# Patient Record
Sex: Female | Born: 1955 | Race: White | Hispanic: No | Marital: Married | Smoking: Never smoker
Health system: Southern US, Community
[De-identification: ages and names within clinical notes are randomized; demographics above are authoritative.]

## PROBLEM LIST (undated history)

## (undated) DIAGNOSIS — J45909 Unspecified asthma, uncomplicated: Secondary | ICD-10-CM

## (undated) DIAGNOSIS — Z973 Presence of spectacles and contact lenses: Secondary | ICD-10-CM

## (undated) DIAGNOSIS — T7840XA Allergy, unspecified, initial encounter: Secondary | ICD-10-CM

## (undated) DIAGNOSIS — R209 Unspecified disturbances of skin sensation: Secondary | ICD-10-CM

## (undated) DIAGNOSIS — R011 Cardiac murmur, unspecified: Secondary | ICD-10-CM

## (undated) DIAGNOSIS — G4730 Sleep apnea, unspecified: Secondary | ICD-10-CM

## (undated) DIAGNOSIS — M79609 Pain in unspecified limb: Secondary | ICD-10-CM

## (undated) DIAGNOSIS — Z8489 Family history of other specified conditions: Secondary | ICD-10-CM

## (undated) DIAGNOSIS — R42 Dizziness and giddiness: Secondary | ICD-10-CM

## (undated) DIAGNOSIS — E7800 Pure hypercholesterolemia, unspecified: Secondary | ICD-10-CM

## (undated) DIAGNOSIS — I209 Angina pectoris, unspecified: Secondary | ICD-10-CM

## (undated) DIAGNOSIS — R809 Proteinuria, unspecified: Secondary | ICD-10-CM

## (undated) DIAGNOSIS — I10 Essential (primary) hypertension: Secondary | ICD-10-CM

## (undated) DIAGNOSIS — Q891 Congenital malformations of adrenal gland: Secondary | ICD-10-CM

## (undated) DIAGNOSIS — M1990 Unspecified osteoarthritis, unspecified site: Secondary | ICD-10-CM

## (undated) DIAGNOSIS — R21 Rash and other nonspecific skin eruption: Secondary | ICD-10-CM

## (undated) DIAGNOSIS — K589 Irritable bowel syndrome without diarrhea: Secondary | ICD-10-CM

## (undated) DIAGNOSIS — B019 Varicella without complication: Secondary | ICD-10-CM

## (undated) DIAGNOSIS — R946 Abnormal results of thyroid function studies: Secondary | ICD-10-CM

## (undated) DIAGNOSIS — I4891 Unspecified atrial fibrillation: Secondary | ICD-10-CM

## (undated) DIAGNOSIS — R5381 Other malaise: Secondary | ICD-10-CM

## (undated) DIAGNOSIS — R32 Unspecified urinary incontinence: Secondary | ICD-10-CM

## (undated) DIAGNOSIS — K219 Gastro-esophageal reflux disease without esophagitis: Secondary | ICD-10-CM

## (undated) HISTORY — PX: EYE SURGERY: SHX253

## (undated) HISTORY — DX: Unspecified asthma, uncomplicated: J45.909

## (undated) HISTORY — DX: Unspecified osteoarthritis, unspecified site: M19.90

## (undated) HISTORY — DX: Gastro-esophageal reflux disease without esophagitis: K21.9

## (undated) HISTORY — DX: Varicella without complication: B01.9

## (undated) HISTORY — DX: Sleep apnea, unspecified: G47.30

## (undated) HISTORY — DX: Irritable bowel syndrome, unspecified: K58.9

## (undated) HISTORY — DX: Allergy, unspecified, initial encounter: T78.40XA

## (undated) HISTORY — DX: Cardiac murmur, unspecified: R01.1

## (undated) HISTORY — DX: Unspecified urinary incontinence: R32

## (undated) HISTORY — PX: URETHRAL DILATION: SUR417

## (undated) HISTORY — PX: ESOPHAGOGASTRODUODENOSCOPY: SHX1529

## (undated) HISTORY — PX: COLONOSCOPY: SHX174

---
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
## 2004-06-23 ENCOUNTER — Ambulatory Visit: Payer: Self-pay | Admitting: Internal Medicine

## 2004-07-05 ENCOUNTER — Ambulatory Visit: Payer: Self-pay | Admitting: Internal Medicine

## 2004-09-14 ENCOUNTER — Ambulatory Visit: Payer: Self-pay

## 2005-01-03 ENCOUNTER — Ambulatory Visit: Payer: Self-pay | Admitting: Internal Medicine

## 2005-06-25 ENCOUNTER — Ambulatory Visit: Payer: Self-pay | Admitting: Internal Medicine

## 2005-10-11 ENCOUNTER — Ambulatory Visit: Payer: Self-pay | Admitting: Internal Medicine

## 2006-03-26 ENCOUNTER — Ambulatory Visit: Payer: Self-pay | Admitting: Internal Medicine

## 2006-07-04 ENCOUNTER — Ambulatory Visit: Payer: Self-pay | Admitting: Internal Medicine

## 2006-07-12 ENCOUNTER — Ambulatory Visit: Payer: Self-pay | Admitting: Internal Medicine

## 2006-10-22 ENCOUNTER — Emergency Department: Payer: Self-pay | Admitting: Unknown Physician Specialty

## 2007-09-23 ENCOUNTER — Ambulatory Visit: Payer: Self-pay | Admitting: Internal Medicine

## 2008-09-03 HISTORY — PX: ABDOMINAL HYSTERECTOMY: SHX81

## 2008-09-23 ENCOUNTER — Ambulatory Visit: Payer: Self-pay | Admitting: Internal Medicine

## 2008-09-30 ENCOUNTER — Ambulatory Visit: Payer: Self-pay | Admitting: Internal Medicine

## 2010-01-02 ENCOUNTER — Ambulatory Visit: Payer: Self-pay | Admitting: Internal Medicine

## 2010-02-20 ENCOUNTER — Ambulatory Visit: Payer: Self-pay | Admitting: Unknown Physician Specialty

## 2010-03-27 ENCOUNTER — Ambulatory Visit: Payer: Self-pay | Admitting: Internal Medicine

## 2011-01-18 ENCOUNTER — Ambulatory Visit: Payer: Self-pay | Admitting: Internal Medicine

## 2011-05-04 ENCOUNTER — Ambulatory Visit: Payer: Self-pay | Admitting: Internal Medicine

## 2011-05-05 LAB — HM PAP SMEAR

## 2011-09-04 LAB — HM COLONOSCOPY

## 2012-01-22 ENCOUNTER — Ambulatory Visit: Payer: Self-pay | Admitting: Internal Medicine

## 2012-03-17 ENCOUNTER — Ambulatory Visit: Payer: Self-pay | Admitting: Unknown Physician Specialty

## 2012-03-19 LAB — PATHOLOGY REPORT

## 2012-06-06 ENCOUNTER — Ambulatory Visit (INDEPENDENT_AMBULATORY_CARE_PROVIDER_SITE_OTHER): Payer: PRIVATE HEALTH INSURANCE | Admitting: Internal Medicine

## 2012-06-06 ENCOUNTER — Encounter: Payer: Self-pay | Admitting: Internal Medicine

## 2012-06-06 VITALS — BP 120/80 | HR 70 | Temp 98.6°F | Ht 63.75 in | Wt 229.8 lb

## 2012-06-06 DIAGNOSIS — Z23 Encounter for immunization: Secondary | ICD-10-CM

## 2012-06-06 DIAGNOSIS — E119 Type 2 diabetes mellitus without complications: Secondary | ICD-10-CM | POA: Insufficient documentation

## 2012-06-06 DIAGNOSIS — K219 Gastro-esophageal reflux disease without esophagitis: Secondary | ICD-10-CM | POA: Insufficient documentation

## 2012-06-06 DIAGNOSIS — M79606 Pain in leg, unspecified: Secondary | ICD-10-CM

## 2012-06-06 DIAGNOSIS — I10 Essential (primary) hypertension: Secondary | ICD-10-CM

## 2012-06-06 DIAGNOSIS — M79609 Pain in unspecified limb: Secondary | ICD-10-CM

## 2012-06-06 DIAGNOSIS — E7800 Pure hypercholesterolemia, unspecified: Secondary | ICD-10-CM | POA: Insufficient documentation

## 2012-06-06 DIAGNOSIS — R32 Unspecified urinary incontinence: Secondary | ICD-10-CM

## 2012-06-06 MED ORDER — HYDROCHLOROTHIAZIDE 25 MG PO TABS: 25.0000 mg | ORAL_TABLET | Freq: Every day | ORAL | Status: DC

## 2012-06-06 MED ORDER — OXYBUTYNIN CHLORIDE 5 MG PO TABS: 5.0000 mg | ORAL_TABLET | Freq: Two times a day (BID) | ORAL | Status: DC

## 2012-06-06 MED ORDER — LISINOPRIL 40 MG PO TABS: 40.0000 mg | ORAL_TABLET | Freq: Every day | ORAL | Status: DC

## 2012-06-06 NOTE — Assessment & Plan Note (Addendum)
States sugars have been averaging 150.  Discussed importance of following a diabetic diet and exercise.  She is walking more.  Will obtain records to review.  States had recent labs.  Record sugars bid.  Follow metabolic panel and a1c.  Keep up to date with eye checks.  Same meds.

## 2012-06-06 NOTE — Patient Instructions (Signed)
It was good to see you today.  Let me know if the leg pain does not resolve.  We will schedule your physical and your follow up labs.

## 2012-06-06 NOTE — Assessment & Plan Note (Signed)
Low cholesterol diet and exercise.  Check lipid profile with next labs.     

## 2012-06-06 NOTE — Progress Notes (Signed)
  Subjective:    Patient ID: Christine Robertson, female    DOB: 23-Aug-1956, 56 y.o.   MRN: [id number]  HPI 56 year old female with past history of diabetes, hypertension and GERD who comes in today for a scheduled follow up.  She states that she has been doing relatively well.  States sugars have been averaging 150.  Trying to watch what she eats.  Walking more.  Blood pressure (per her report) has been well controlled.  She fell two weeks ago.  Landed on her right knee.  Slipped in the shower.  Denies any head, back or hip injury.  Only hurt her knee.  Bruised initially.  Is resolving.  No knee pain now.  No increased swelling.  Minimal discomfort just below her knee.  No increased warmth.  Able to walk without any pain.    Past Medical History  Diagnosis Date  . Arthritis   . Chicken pox   . Diabetes mellitus   . GERD (gastroesophageal reflux disease)   . Allergy     hay fever  . Hypertension   . Urinary incontinence      Review of Systems Patient denies any headache, lightheadedness or dizziness.  No chest pain, tightness or palpatations. No increased shortness of breath, cough or congestion.  No acid reflux, dysphagia or odynophagia.  No nausea or vomiting.  No abdominal pain or cramping.  No bowel change, such as diarrhea, constipation, BRBPR or melana.  No urine change.        Objective:   Physical Exam Filed Vitals:   06/06/12 0951  BP: 120/80  Pulse: 70  Temp: 98.6 F (30 C)   56 year old female in no acute distress.   HEENT:  Nares - clear.  OP- without lesions or erythema.  NECK:  Supple, nontender.  No audible carotid bruit.   HEART:  Appears to be regular. LUNGS:  Without crackles or wheezing audible.  Respirations even and unlabored.  Good breath sounds bilaterally.   RADIAL PULSE:  Equal bilaterally.  ABDOMEN:  Soft, nontender.  No audible abdominal bruit.   EXTREMITIES:  No increased edema to be present.  Resolving hematoma - medial aspect of knee and upper calf.  No  pain to palpation over the knee.  No increased erythema or warmth. No pain with flexion or full extension - at the knee.   Minimal tenderness to palpation anterior tibial just below the knee.  Some stasis changes.                   Assessment & Plan:  1.  LEG PAIN.  S/P fall.  No knee pain on exam.  Minimal pain just below the knee.  No pain with ambulation.  Tylenol prn as directed.  Warm compresses.  Follow.  Call or return if symptoms do not resolve or any change.

## 2012-06-06 NOTE — Assessment & Plan Note (Signed)
Controlled on Omeprazole 

## 2012-06-06 NOTE — Assessment & Plan Note (Signed)
Symptoms controlled on Ditropan.  Continue.    

## 2012-06-06 NOTE — Assessment & Plan Note (Signed)
Controlled on current regimen.  Same meds.         

## 2012-09-09 ENCOUNTER — Other Ambulatory Visit: Payer: Self-pay | Admitting: Internal Medicine

## 2012-09-09 MED ORDER — SITAGLIPTIN PHOSPHATE 100 MG PO TABS: 100.0000 mg | ORAL_TABLET | Freq: Every day | ORAL | Status: DC

## 2012-09-09 NOTE — Telephone Encounter (Signed)
sitaGLIPtin (JANUVIA) 100 MG tablet  # 30

## 2012-09-09 NOTE — Telephone Encounter (Signed)
Rx sent to pharmacy electronically.   

## 2012-09-12 ENCOUNTER — Encounter: Payer: PRIVATE HEALTH INSURANCE | Admitting: Internal Medicine

## 2012-09-12 ENCOUNTER — Telehealth: Payer: Self-pay | Admitting: Internal Medicine

## 2012-09-12 NOTE — Telephone Encounter (Signed)
Already sent into pharmacy by [facility]

## 2012-09-12 NOTE — Telephone Encounter (Signed)
Pt is needing refill on Jenuvia 100 mg. She uses [facility] in [place]

## 2012-11-10 ENCOUNTER — Ambulatory Visit (INDEPENDENT_AMBULATORY_CARE_PROVIDER_SITE_OTHER): Payer: PRIVATE HEALTH INSURANCE | Admitting: Internal Medicine

## 2012-11-10 ENCOUNTER — Encounter: Payer: Self-pay | Admitting: Internal Medicine

## 2012-11-10 VITALS — BP 110/70 | HR 68 | Temp 97.3°F | Ht 63.75 in | Wt 287.5 lb

## 2012-11-10 DIAGNOSIS — I10 Essential (primary) hypertension: Secondary | ICD-10-CM

## 2012-11-10 DIAGNOSIS — K219 Gastro-esophageal reflux disease without esophagitis: Secondary | ICD-10-CM

## 2012-11-10 DIAGNOSIS — E7800 Pure hypercholesterolemia, unspecified: Secondary | ICD-10-CM

## 2012-11-10 DIAGNOSIS — E119 Type 2 diabetes mellitus without complications: Secondary | ICD-10-CM

## 2012-11-10 DIAGNOSIS — Z1239 Encounter for other screening for malignant neoplasm of breast: Secondary | ICD-10-CM

## 2012-11-10 DIAGNOSIS — R32 Unspecified urinary incontinence: Secondary | ICD-10-CM

## 2012-11-10 DIAGNOSIS — R5381 Other malaise: Secondary | ICD-10-CM

## 2012-11-10 MED ORDER — FLUTICASONE PROPIONATE HFA 110 MCG/ACT IN AERO: 1.0000 | INHALATION_SPRAY | Freq: Two times a day (BID) | RESPIRATORY_TRACT | Status: DC

## 2012-11-10 MED ORDER — PREDNISONE 10 MG PO TABS: ORAL_TABLET | ORAL | Status: DC

## 2012-11-10 MED ORDER — ALBUTEROL SULFATE HFA 108 (90 BASE) MCG/ACT IN AERS: 2.0000 | INHALATION_SPRAY | Freq: Four times a day (QID) | RESPIRATORY_TRACT | Status: DC | PRN

## 2012-11-10 MED ORDER — FLUTICASONE PROPIONATE 50 MCG/ACT NA SUSP: 2.0000 | Freq: Every day | NASAL | Status: DC

## 2012-11-10 MED ORDER — LEVOFLOXACIN 500 MG PO TABS: 500.0000 mg | ORAL_TABLET | Freq: Every day | ORAL | Status: DC

## 2012-11-10 NOTE — Assessment & Plan Note (Signed)
Sugars as outlined.  Discussed importance of following a diabetic diet and exercise.  She is walking more.  Record sugars bid.  Follow metabolic panel and a1c.  Keep up to date with eye checks.  Same meds.

## 2012-11-10 NOTE — Assessment & Plan Note (Signed)
Controlled on current regimen.  Same meds.  Check metabolic panel.

## 2012-11-10 NOTE — Assessment & Plan Note (Signed)
Symptoms controlled on Ditropan.  Continue.

## 2012-11-10 NOTE — Assessment & Plan Note (Signed)
Controlled on Omeprazole 

## 2012-11-10 NOTE — Progress Notes (Signed)
Subjective:    Patient ID: Jackqulyn Livings, female    DOB: May 21, 1956, 57 y.o.   MRN: [id number]  HPI 57 year old female with past history of diabetes, hypertension and GERD who comes in today to follow up on these issues as well as for a complete physical exam.  She states that she has been doing relatively well.  States sugars have been averaging 159-180 in the am and lower in the pm.  She does report that over the last two months she has had some increased congestion.  Ears full.  Increased cough - worse at night.  Colored mucus production - light green.  Increased drainage.  Some wheezing.  No chest congestion.  No acid reflux. No bowel change.    Past Medical History  Diagnosis Date  . Arthritis   . Chicken pox   . Diabetes mellitus   . GERD (gastroesophageal reflux disease)   . Allergy     hay fever  . Hypertension   . Urinary incontinence     Current Outpatient Prescriptions on File Prior to Visit  Medication Sig Dispense Refill  . aspirin 81 MG tablet Take 81 mg by mouth daily.      . cetirizine (ZYRTEC) 10 MG tablet Take 10 mg by mouth daily.      . hydrochlorothiazide (HYDRODIURIL) 25 MG tablet Take 1 tablet (25 mg total) by mouth daily.  90 tablet  3  . lisinopril (PRINIVIL,ZESTRIL) 40 MG tablet Take 1 tablet (40 mg total) by mouth daily.  90 tablet  3  . metFORMIN (GLUCOPHAGE) 1000 MG tablet Take 1,000 mg by mouth 2 (two) times daily with a meal.      . Multiple Vitamin (MULTIVITAMIN) tablet Take 1 tablet by mouth daily.      Marland Kitchen omeprazole (PRILOSEC) 20 MG capsule Take 20 mg by mouth 2 (two) times daily.      Marland Kitchen oxybutynin (DITROPAN) 5 MG tablet Take 1 tablet (5 mg total) by mouth 2 (two) times daily.  180 tablet  3  . sitaGLIPtin (JANUVIA) 100 MG tablet Take 1 tablet (100 mg total) by mouth daily.  30 tablet  6  . Calcium Carbonate-Vitamin D (CALCIUM 600+D) 600-400 MG-UNIT per tablet Take 1 tablet by mouth 2 (two) times daily.       No current facility-administered medications  on file prior to visit.    Review of Systems Patient denies any headache, lightheadedness or dizziness.  Increased nasal congestion, drainage and productive mucus (colored).  No chest pain or palpitations. Increased cough and congestion.  Some wheezing.   No acid reflux, dysphagia or odynophagia.  No nausea or vomiting.  No abdominal pain or cramping.  No bowel change, such as diarrhea, constipation, BRBPR or melana.  No urine change.  Sugars as outlined.  Blood pressure averaging 110-115/70-75.  Is walking some.  No following a strict diet.      Objective:   Physical Exam  Filed Vitals:   11/10/12 1503  BP: 110/70  Pulse: 68  Temp: 97.3 F (67.11 C)   57 year old female in no acute distress.   HEENT:  Nares- clear.  Oropharynx - without lesions. NECK:  Supple.  Nontender.  No audible bruit.  HEART:  Appears to be regular. LUNGS:  No crackles or wheezing audible.  Respirations even and unlabored.  RADIAL PULSE:  Equal bilaterally.    BREASTS:  No nipple discharge or nipple retraction present.  Could not appreciate any distinct nodules or axillary adenopathy.  ABDOMEN:  Soft, nontender.  Bowel sounds present and normal.  No audible abdominal bruit.  GU:  Normal external genitalia.  Vaginal vault without lesions.  S/p hysterectomy.   Could not appreciate any adnexal masses or tenderness.   RECTAL:  Heme negative.   EXTREMITIES:  No increased edema present.  DP pulses palpable and equal bilaterally.        Assessment & Plan:  SINUSITIS/URI.  Will treat with levaquin 500mg  q day.  Flonase nasal spray and saline nasal spray as directed.  Flovent - as directed.  Albuterol inhaler as directed.  Prednisone taper starting at 40mg  and decreasing by 5mg  each day until off.  Follow closely.  Notify me if symptoms worsen or do not resolve.    HEALTH MAINTENANCE.  Physical today.  Is s/p hysterectomy.  Schedule mammogram when due.  Obtain records to review - for colonoscopy.

## 2012-11-10 NOTE — Assessment & Plan Note (Signed)
Low cholesterol diet and exercise.  Check lipid profile with next labs.

## 2012-11-15 ENCOUNTER — Emergency Department (HOSPITAL_COMMUNITY)
Admission: EM | Admit: 2012-11-15 | Discharge: 2012-11-15 | Disposition: A | Discharge status: Home / Self Care | Payer: PRIVATE HEALTH INSURANCE | Attending: Emergency Medicine | Admitting: Emergency Medicine

## 2012-11-15 ENCOUNTER — Encounter (HOSPITAL_COMMUNITY): Payer: Self-pay | Admitting: Emergency Medicine

## 2012-11-15 DIAGNOSIS — K219 Gastro-esophageal reflux disease without esophagitis: Secondary | ICD-10-CM | POA: Insufficient documentation

## 2012-11-15 DIAGNOSIS — IMO0002 Reserved for concepts with insufficient information to code with codable children: Secondary | ICD-10-CM | POA: Insufficient documentation

## 2012-11-15 DIAGNOSIS — E119 Type 2 diabetes mellitus without complications: Secondary | ICD-10-CM | POA: Insufficient documentation

## 2012-11-15 DIAGNOSIS — Z8739 Personal history of other diseases of the musculoskeletal system and connective tissue: Secondary | ICD-10-CM | POA: Insufficient documentation

## 2012-11-15 DIAGNOSIS — Z79899 Other long term (current) drug therapy: Secondary | ICD-10-CM | POA: Insufficient documentation

## 2012-11-15 DIAGNOSIS — I10 Essential (primary) hypertension: Secondary | ICD-10-CM | POA: Insufficient documentation

## 2012-11-15 DIAGNOSIS — R238 Other skin changes: Secondary | ICD-10-CM

## 2012-11-15 DIAGNOSIS — R233 Spontaneous ecchymoses: Secondary | ICD-10-CM

## 2012-11-15 DIAGNOSIS — Z8619 Personal history of other infectious and parasitic diseases: Secondary | ICD-10-CM | POA: Insufficient documentation

## 2012-11-15 DIAGNOSIS — Z7982 Long term (current) use of aspirin: Secondary | ICD-10-CM | POA: Insufficient documentation

## 2012-11-15 DIAGNOSIS — R58 Hemorrhage, not elsewhere classified: Secondary | ICD-10-CM | POA: Insufficient documentation

## 2012-11-15 LAB — URINALYSIS, ROUTINE W REFLEX MICROSCOPIC
Leukocytes, UA: NEGATIVE
Nitrite: NEGATIVE
Specific Gravity, Urine: 1.017 (ref 1.005–1.030)
pH: 5 (ref 5.0–8.0)

## 2012-11-15 LAB — CBC WITH DIFFERENTIAL/PLATELET
Basophils Absolute: 0 10*3/uL (ref 0.0–0.1)
Basophils Relative: 0 % (ref 0–1)
Eosinophils Relative: 2 % (ref 0–5)
HCT: 34.3 % — ABNORMAL LOW (ref 36.0–46.0)
MCHC: 34.7 g/dL (ref 30.0–36.0)
MCV: 84.3 fL (ref 78.0–100.0)
Monocytes Absolute: 0.6 10*3/uL (ref 0.1–1.0)
Neutro Abs: 7.2 10*3/uL (ref 1.7–7.7)
Platelets: 262 10*3/uL (ref 150–400)
RDW: 13.7 % (ref 11.5–15.5)

## 2012-11-15 LAB — COMPREHENSIVE METABOLIC PANEL
AST: 33 U/L (ref 0–37)
Albumin: 3.6 g/dL (ref 3.5–5.2)
Calcium: 9.7 mg/dL (ref 8.4–10.5)
Creatinine, Ser: 0.74 mg/dL (ref 0.50–1.10)

## 2012-11-15 LAB — APTT: aPTT: 30 seconds (ref 24–37)

## 2012-11-15 LAB — PROTIME-INR: INR: 0.99 (ref 0.00–1.49)

## 2012-11-15 NOTE — ED Provider Notes (Signed)
History  This chart was scribed for non-physician practitioner working with Dierdre Forth, PA-C/ Vida Roller, MD, by Candelaria Stagers, ED Scribe. This patient was seen in room TR10C/TR10C and the patient's care was started at 6:14 PM   CSN: [id number]  Arrival date & time 11/15/12  1421   First MD Initiated Contact with Patient 11/15/12 1811      Chief Complaint  Patient presents with  . Bleeding/Bruising     The history is provided by the patient. No language interpreter was used.   Christine Robertson is a 57 y.o. female who presents to the Emergency Department complaining of bruising to her legs and back that started last night.  She has recently started taking 40 mg taper of prednisone on 3/11 for URI and after calling her pharmacist was advised to come to the ED.  She denies any recent falls or trauma.  Pt has h/o diabetes and HTN.  Pt denies experiencing these sx before.  She denies abdominal pain or new SOB. She has no Hx of heavy bleeding and denies bleeding gums while brushing teeth, nose bleeds, blood in urine or feces.    Past Medical History  Diagnosis Date  . Arthritis   . Chicken pox   . Diabetes mellitus   . GERD (gastroesophageal reflux disease)   . Allergy     hay fever  . Hypertension   . Urinary incontinence     Past Surgical History  Procedure Laterality Date  . Abdominal hysterectomy  2010  . Tonsillectomy and adenoidectomy  1959  . Bladder surgery  1967    stretching of bladder    Family History  Problem Relation Age of Onset  . Cancer Mother     parents  . Diabetes Mother   . Diabetes Father   . Cancer Maternal Grandmother     breast    History  Substance Use Topics  . Smoking status: Never Smoker   . Smokeless tobacco: Never Used  . Alcohol Use: No    OB History   Grav Para Term Preterm Abortions TAB SAB Ect Mult Living                  Review of Systems  Respiratory: Negative for shortness of breath.   Gastrointestinal: Negative  for abdominal pain.  Skin: Positive for color change (bruising).  All other systems reviewed and are negative.    Allergies  Augmentin; Demerol; and Erythromycin  Home Medications   Current Outpatient Rx  Name  Route  Sig  Dispense  Refill  . albuterol (PROVENTIL HFA;VENTOLIN HFA) 108 (90 BASE) MCG/ACT inhaler   Inhalation   Inhale 2 puffs into the lungs every 6 (six) hours as needed for wheezing.   1 Inhaler   0   . aspirin 81 MG tablet   Oral   Take 81 mg by mouth daily.         . cetirizine (ZYRTEC) 10 MG tablet   Oral   Take 10 mg by mouth daily.         . fluticasone (FLONASE) 50 MCG/ACT nasal spray   Nasal   Place 2 sprays into the nose daily.   16 g   2   . fluticasone (FLOVENT HFA) 110 MCG/ACT inhaler   Inhalation   Inhale 1 puff into the lungs 2 (two) times daily.   1 Inhaler   1   . hydrochlorothiazide (HYDRODIURIL) 25 MG tablet   Oral   Take 1 tablet (  25 mg total) by mouth daily.   90 tablet   3   . levofloxacin (LEVAQUIN) 500 MG tablet   Oral   Take 1 tablet (500 mg total) by mouth daily.   10 tablet   0   . lisinopril (PRINIVIL,ZESTRIL) 40 MG tablet   Oral   Take 1 tablet (40 mg total) by mouth daily.   90 tablet   3   . metFORMIN (GLUCOPHAGE) 1000 MG tablet   Oral   Take 1,000 mg by mouth 2 (two) times daily with a meal.         . Multiple Vitamin (MULTIVITAMIN) tablet   Oral   Take 1 tablet by mouth daily.         Marland Kitchen omeprazole (PRILOSEC) 20 MG capsule   Oral   Take 20 mg by mouth 2 (two) times daily.         Marland Kitchen oxybutynin (DITROPAN) 5 MG tablet   Oral   Take 1 tablet (5 mg total) by mouth 2 (two) times daily.   180 tablet   3   . predniSONE (DELTASONE) 10 MG tablet      Take 4 tablets x 1 day and then decrease by 1/2 tablet per day until down to zero mg.   18 tablet   0   . sitaGLIPtin (JANUVIA) 100 MG tablet   Oral   Take 1 tablet (100 mg total) by mouth daily.   30 tablet   6     BP 148/75  Pulse 84   Temp(Src) 97.8 F (36.6 C) (Oral)  Resp 18  SpO2 95%  Physical Exam  Nursing note and vitals reviewed. Constitutional: She is oriented to person, place, and time. She appears well-developed and well-nourished. No distress.  HENT:  Head: Normocephalic and atraumatic.  Mouth/Throat: Oropharynx is clear and moist. No oropharyngeal exudate.  No petechiae or purpura noted in the oropharynx.    Eyes: EOM are normal. Pupils are equal, round, and reactive to light. No scleral icterus.  Mild pallor of conjunctiva.    Neck: Normal range of motion. Neck supple.  Cardiovascular: Normal rate, regular rhythm, normal heart sounds and intact distal pulses.  Exam reveals no gallop and no friction rub.   No murmur heard. Pulmonary/Chest: Effort normal and breath sounds normal. No respiratory distress. She has no wheezes. She has no rales. She exhibits no tenderness.  Abdominal: Soft. Bowel sounds are normal. She exhibits no mass. There is no tenderness. There is no rebound and no guarding.  Musculoskeletal: Normal range of motion. She exhibits no edema and no tenderness.  Lymphadenopathy:    She has no cervical adenopathy.  Neurological: She is alert and oriented to person, place, and time. She exhibits normal muscle tone. Coordination normal.  Speech is clear and goal oriented Moves extremities without ataxia  Skin: Skin is warm and dry. She is not diaphoretic. No erythema.  Multiple bruises over legs and abdomen all about 3 cm in diameter.  6 cm in diameter above gluteal cleft.  7 cm in diameter dark bruise to left flank.  3 cm in diameter bruise to the right flank.    Psychiatric: She has a normal mood and affect.    ED Course  Procedures   DIAGNOSTIC STUDIES: Oxygen Saturation is 95% on room air, normal by my interpretation.    COORDINATION OF CARE:  6:17 PM Discussed course of care with pt which includes blood work.  Pt understands and agrees.    Labs  Reviewed  CBC WITH DIFFERENTIAL -  Abnormal; Notable for the following:    WBC 11.7 (*)    Hemoglobin 11.9 (*)    HCT 34.3 (*)    All other components within normal limits  COMPREHENSIVE METABOLIC PANEL - Abnormal; Notable for the following:    Glucose, Bld 129 (*)    BUN 24 (*)    ALT 54 (*)    Total Bilirubin 0.2 (*)    All other components within normal limits  URINALYSIS, ROUTINE W REFLEX MICROSCOPIC  PROTIME-INR  APTT   No results found.   1. Abnormal bruising   2. Bruising, spontaneous       MDM  Jackqulyn Livings presents with spontaneous brusing.  Concern for ITP, TTP or other clotting disorder.  Less likely anemic in nature. Pt without B symptoms, no lymphadenopathy or fevers; I do not suspect Lymphoma.  Pt labs largely unremarkable; specifically there are no noted disorders with red blood cells, or platelets.  She has a mild leukocytosis of 11.7 without left shift. Patient with mildly elevated ALT at 54 she has a history of fatty liver. Patient's coagulation function is within normal limits and there is no evidence of blood in her urine. Patient is nontoxic, nonseptic appearing. She is in no apparent distress. Plan is to discharge home and have her followup with hematology on Monday.  I have also discussed reasons to return immediately to the ER.  Patient expresses understanding and agrees with plan.  I personally performed the services described in this documentation, which was scribed in my presence. The recorded information has been reviewed and is accurate.    Dahlia Client Arland Usery, PA-C 11/15/12 2354

## 2012-11-15 NOTE — ED Notes (Signed)
Pt recently started on Prednisone, proventil, flovent and fluticasone and Levofloxacin on Tuesday began with having bruising on multiple parts of her body including left flank, B/L legs, and buttock area. Pt denies recent injuries.

## 2012-11-15 NOTE — ED Notes (Signed)
PT denies any active bleeding including rectal bleeding.

## 2012-11-15 NOTE — ED Provider Notes (Signed)
57 year old female with a history of easy bruising over the last couple of days. She presents with multiple bruises to her abdomen, flanks, lower extremities which are unexplained. The patient denies any history of easy bleeding, has had no bleeding of her gums, no GI bleeding, no hematemesis or hemoptysis. She denies taking any other blood thinners was recently started on prednisone.  As well as a couple of other medications for what was thought to be an upper respiratory infection.  On my exam the patient has multiple small areas of bruising over her proximal lower 70s, buttocks, gluteal cleft, left flank. She has minimal tenderness over these areas, and she is morbidly obese, she has no difficulty breathing, no lesions in her mouth, normal appearing conjunctiva and oral mucosa.  Labs pending to evaluate the source of her easy bruising, check platelets, coagulopathy, hemoglobin level.  Medical screening examination/treatment/procedure(s) were conducted as a shared visit with non-physician practitioner(s) and myself.  I personally evaluated the patient during the encounter    Vida Roller, MD 11/15/12 [phone]

## 2012-11-15 NOTE — ED Notes (Signed)
Pt c/o bruising to the left flank and various other areas, states she has recently started taking prednisone, abx, and nasal spray for resp infection.  States she also takes 81 mg ASA/day.  Called her MD and was told to come to the ER

## 2012-11-17 ENCOUNTER — Encounter: Payer: Self-pay | Admitting: Internal Medicine

## 2012-11-17 ENCOUNTER — Telehealth: Payer: Self-pay | Admitting: Emergency Medicine

## 2012-11-17 NOTE — ED Provider Notes (Signed)
Medical screening examination/treatment/procedure(s) were conducted as a shared visit with non-physician practitioner(s) and myself.  I personally evaluated the patient during the encounter  Please see my separate respective documentation pertaining to this patient encounter   Vida Roller, MD 11/17/12 1447

## 2012-11-18 ENCOUNTER — Encounter: Payer: Self-pay | Admitting: Internal Medicine

## 2012-11-18 ENCOUNTER — Telehealth: Payer: Self-pay | Admitting: Internal Medicine

## 2012-11-18 ENCOUNTER — Ambulatory Visit (INDEPENDENT_AMBULATORY_CARE_PROVIDER_SITE_OTHER): Payer: PRIVATE HEALTH INSURANCE | Admitting: Internal Medicine

## 2012-11-18 VITALS — BP 130/68 | HR 72 | Temp 97.8°F | Ht 63.75 in | Wt 288.0 lb

## 2012-11-18 DIAGNOSIS — I10 Essential (primary) hypertension: Secondary | ICD-10-CM

## 2012-11-18 DIAGNOSIS — E119 Type 2 diabetes mellitus without complications: Secondary | ICD-10-CM

## 2012-11-18 NOTE — Telephone Encounter (Signed)
Pt called to confirm appointment she will be here

## 2012-11-18 NOTE — Telephone Encounter (Signed)
Left message on voice mail asking pt if she could come in today @ 11:30 and i ask pt to call office to confirm

## 2012-11-18 NOTE — Telephone Encounter (Signed)
This patient sent me a my chart message about an ER visit.  See if she can come in today 11/18/12 at 11:30 for ER follow up.  Thanks.

## 2012-11-23 ENCOUNTER — Encounter: Payer: Self-pay | Admitting: Internal Medicine

## 2012-11-23 NOTE — Assessment & Plan Note (Signed)
Record sugars bid.  Follow metabolic panel and a1c.  Same meds.

## 2012-11-23 NOTE — Progress Notes (Signed)
Subjective:    Patient ID: Christine Robertson, female    DOB: Apr 07, 1956, 57 y.o.   MRN: [id number]  HPI 57 year old female with past history of diabetes, hypertension and GERD who comes in today for an ER follow up.  She was seen here on 11/10/12 and was treated for a respiratory infection.  Was placed on inhalers, abx and oral prednisone.  She noticed over this past weekend that she had increased bruising.  She called the nurses line and was instructed to go to the ER for evaluation.  Labs were unrevealing.  She stopped the prednisone because she thought the bruising might be related to this medication.  Was instructed by the ER physician to follow up with hematology.  She was asked to come here for a follow up first.  She is off the prednisone.  The breathing is better, but still with increased cough and congestion.  The bruising is starting to reabsorb.  No increased bruising.  No rectal bleeding, nose bleeds, etc.  Only using her inhaler q day.  Still on abx.    Past Medical History  Diagnosis Date  . Arthritis   . Chicken pox   . Diabetes mellitus   . GERD (gastroesophageal reflux disease)   . Allergy     hay fever  . Hypertension   . Urinary incontinence     Current Outpatient Prescriptions on File Prior to Visit  Medication Sig Dispense Refill  . albuterol (PROVENTIL HFA;VENTOLIN HFA) 108 (90 BASE) MCG/ACT inhaler Inhale 2 puffs into the lungs every 6 (six) hours as needed for wheezing.  1 Inhaler  0  . aspirin 81 MG tablet Take 81 mg by mouth daily.      . cetirizine (ZYRTEC) 10 MG tablet Take 10 mg by mouth daily.      . fluticasone (FLONASE) 50 MCG/ACT nasal spray Place 2 sprays into the nose daily.  16 g  2  . fluticasone (FLOVENT HFA) 110 MCG/ACT inhaler Inhale 1 puff into the lungs 2 (two) times daily.  1 Inhaler  1  . hydrochlorothiazide (HYDRODIURIL) 25 MG tablet Take 1 tablet (25 mg total) by mouth daily.  90 tablet  3  . levofloxacin (LEVAQUIN) 500 MG tablet Take 1 tablet (500 mg  total) by mouth daily.  10 tablet  0  . lisinopril (PRINIVIL,ZESTRIL) 40 MG tablet Take 1 tablet (40 mg total) by mouth daily.  90 tablet  3  . metFORMIN (GLUCOPHAGE) 1000 MG tablet Take 1,000 mg by mouth 2 (two) times daily with a meal.      . Multiple Vitamin (MULTIVITAMIN) tablet Take 1 tablet by mouth daily.      Marland Kitchen omeprazole (PRILOSEC) 20 MG capsule Take 20 mg by mouth 2 (two) times daily.      Marland Kitchen oxybutynin (DITROPAN) 5 MG tablet Take 1 tablet (5 mg total) by mouth 2 (two) times daily.  180 tablet  3  . sitaGLIPtin (JANUVIA) 100 MG tablet Take 1 tablet (100 mg total) by mouth daily.  30 tablet  6  . predniSONE (DELTASONE) 10 MG tablet Take 4 tablets x 1 day and then decrease by 1/2 tablet per day until down to zero mg.  18 tablet  0   No current facility-administered medications on file prior to visit.    Review of Systems Patient denies any headache, lightheadedness or dizziness.  No chest pain or palpitations. Still with some congestion and cough, but has improved.  Breathing is better.  No acid  reflux, dysphagia or odynophagia.  No nausea or vomiting.  No abdominal pain or cramping.  No bowel change, such as diarrhea, constipation, BRBPR or melana.  No urine change.   Bruising as outlined above.  Appears to be reabsorbing.  No other bleeding.      Objective:   Physical Exam  Filed Vitals:   11/18/12 1110  BP: 130/68  Pulse: 72  Temp: 97.8 F (39.95 C)   57 year old female in no acute distress.   HEENT:  Nares- clear.  Oropharynx - without lesions. NECK:  Supple.  Nontender.  No audible bruit.  HEART:  Appears to be regular. LUNGS:  No crackles or wheezing audible.  Respirations even and unlabored.  RADIAL PULSE:  Equal bilaterally.   ABDOMEN:  Soft, nontender.  Bowel sounds present and normal.  No audible abdominal bruit.    EXTREMITIES:  No increased edema present.  DP pulses palpable and equal bilaterally. SKIN:  Multiple hematomas.  Reabsorbing.  Located on her abdomen,  legs and side.      Assessment & Plan:  EASY BRUISING.  Labs in the ER unrevealing.  Was on a short course of prednisone when this occurred.  Unsure that this is the reason for the bruising.  After discussion with the patient, will hold on referral to hematology at this time.  Treat the infection.  See if bruising continues or reoccurs.  If a persistent problem, will refer.    SINUSITIS/URI.  Complete abx.  Continue inhalers.  Increase the steroid inhaler to bid.  Will remain off oral prednisone.  Follow.  Notify me if symptoms do not resolve.     HEALTH MAINTENANCE.  Physical last visit.  Is s/p hysterectomy.  Obtain records to review - for colonoscopy.

## 2012-11-23 NOTE — Assessment & Plan Note (Signed)
Controlled on current regimen.  Same meds.         

## 2012-12-04 ENCOUNTER — Other Ambulatory Visit (INDEPENDENT_AMBULATORY_CARE_PROVIDER_SITE_OTHER): Payer: PRIVATE HEALTH INSURANCE

## 2012-12-04 DIAGNOSIS — R5381 Other malaise: Secondary | ICD-10-CM

## 2012-12-04 DIAGNOSIS — E7800 Pure hypercholesterolemia, unspecified: Secondary | ICD-10-CM

## 2012-12-04 DIAGNOSIS — I10 Essential (primary) hypertension: Secondary | ICD-10-CM

## 2012-12-04 DIAGNOSIS — E119 Type 2 diabetes mellitus without complications: Secondary | ICD-10-CM

## 2012-12-04 LAB — CBC WITH DIFFERENTIAL/PLATELET
Eosinophils Relative: 2.3 % (ref 0.0–5.0)
HCT: 36.7 % (ref 36.0–46.0)
Lymphs Abs: 1.9 10*3/uL (ref 0.7–4.0)
MCV: 85.5 fl (ref 78.0–100.0)
Monocytes Absolute: 0.4 10*3/uL (ref 0.1–1.0)
Platelets: 222 10*3/uL (ref 150.0–400.0)
WBC: 7.2 10*3/uL (ref 4.5–10.5)

## 2012-12-04 LAB — BASIC METABOLIC PANEL WITH GFR
Chloride: 104 mEq/L (ref 96–112)
GFR, Est Non African American: >89 mL/min
Potassium: 4.4 mEq/L (ref 3.5–5.3)
Sodium: 141 mEq/L (ref 135–145)

## 2012-12-04 LAB — HEPATIC FUNCTION PANEL
ALT: 38 U/L — ABNORMAL HIGH (ref 0–35)
AST: 26 U/L (ref 0–37)
Alkaline Phosphatase: 61 U/L (ref 39–117)
Bilirubin, Direct: 0 mg/dL (ref 0.0–0.3)
Total Bilirubin: 0.2 mg/dL — ABNORMAL LOW (ref 0.3–1.2)

## 2012-12-04 LAB — TSH: TSH: 0.25 u[IU]/mL — ABNORMAL LOW (ref 0.35–5.50)

## 2012-12-04 LAB — LIPID PANEL
HDL: 46.7 mg/dL (ref >39.00)
Triglycerides: 177 mg/dL — ABNORMAL HIGH (ref 0.0–149.0)

## 2012-12-04 LAB — HEMOGLOBIN A1C: Hgb A1c MFr Bld: 7.6 % — ABNORMAL HIGH (ref 4.6–6.5)

## 2012-12-05 ENCOUNTER — Telehealth: Payer: Self-pay | Admitting: Internal Medicine

## 2012-12-05 ENCOUNTER — Ambulatory Visit: Payer: PRIVATE HEALTH INSURANCE

## 2012-12-05 DIAGNOSIS — E1059 Type 1 diabetes mellitus with other circulatory complications: Secondary | ICD-10-CM

## 2012-12-05 LAB — T4, FREE: Free T4: 1.05 ng/dL (ref 0.60–1.60)

## 2012-12-05 NOTE — Telephone Encounter (Signed)
Need to add free T3 and free T4 to blood drawn yesterday.  (dx 244.9).  Let me know if unable to add.

## 2012-12-08 ENCOUNTER — Telehealth: Payer: Self-pay | Admitting: Internal Medicine

## 2012-12-08 NOTE — Telephone Encounter (Signed)
Left message for pt to call office dr scott wanted to know if she could some in 12/15/12 @ 11

## 2012-12-08 NOTE — Telephone Encounter (Signed)
Pt aware of appointment 

## 2012-12-15 ENCOUNTER — Ambulatory Visit (INDEPENDENT_AMBULATORY_CARE_PROVIDER_SITE_OTHER): Payer: PRIVATE HEALTH INSURANCE | Admitting: Internal Medicine

## 2012-12-15 ENCOUNTER — Encounter: Payer: Self-pay | Admitting: Internal Medicine

## 2012-12-15 VITALS — BP 120/70 | HR 88 | Temp 98.3°F | Ht 63.75 in | Wt 283.8 lb

## 2012-12-15 DIAGNOSIS — R946 Abnormal results of thyroid function studies: Secondary | ICD-10-CM

## 2012-12-15 DIAGNOSIS — E119 Type 2 diabetes mellitus without complications: Secondary | ICD-10-CM

## 2012-12-15 DIAGNOSIS — R7989 Other specified abnormal findings of blood chemistry: Secondary | ICD-10-CM

## 2012-12-15 DIAGNOSIS — I10 Essential (primary) hypertension: Secondary | ICD-10-CM

## 2012-12-15 DIAGNOSIS — E7800 Pure hypercholesterolemia, unspecified: Secondary | ICD-10-CM

## 2012-12-15 MED ORDER — GLIMEPIRIDE 2 MG PO TABS: ORAL_TABLET | ORAL | Status: DC

## 2012-12-17 NOTE — Telephone Encounter (Signed)
Please close encounter

## 2012-12-22 ENCOUNTER — Encounter: Payer: Self-pay | Admitting: Internal Medicine

## 2012-12-22 DIAGNOSIS — R946 Abnormal results of thyroid function studies: Secondary | ICD-10-CM | POA: Insufficient documentation

## 2012-12-22 DIAGNOSIS — R7989 Other specified abnormal findings of blood chemistry: Secondary | ICD-10-CM | POA: Insufficient documentation

## 2012-12-22 NOTE — Assessment & Plan Note (Signed)
Controlled on current regimen.  Same meds.

## 2012-12-22 NOTE — Assessment & Plan Note (Signed)
Recent A1c 7.6.  Stressed to her the importance of checking and recording sugars. Also discussed the importance of diet, exercise and weight loss.  On Metformin and Januvia.  Will add amaryl 2mg  (1/2 tablet q day).  Follow sugars.  Get her back in soon to reassess.

## 2012-12-22 NOTE — Assessment & Plan Note (Signed)
TSH recently checked and was suppressed.  Free T3 and free T4 wnl.  Will recheck tsh, free T3 and free T4 at next appt (upcoming soon).

## 2012-12-22 NOTE — Assessment & Plan Note (Signed)
Low cholesterol diet and exercise.  Follow lipid profile.    

## 2012-12-22 NOTE — Progress Notes (Signed)
Subjective:    Patient ID: Christine Robertson, female    DOB: 17-Jun-1956, 57 y.o.   MRN: [id number]  HPI 57 year old female with past history of diabetes, hypertension and GERD who comes in today to discuss her sugars.  Recent a1c elevated - 7.6.  Since the phone call to inform her about her labs, she has been checking her sugars and recording.  Also, has been trying to do better - watching what she eats.  Prior to this, had not been checking her sugars and had not been exercising or watching what she eats.  Plans to get more serious about her diabetes.  States her sugars in the am are averaging 149-159 and pm sugars 159-169.  Has started walking some.  Breathing better.  No cough or congestion.  No nausea or vomiting.  On metformin and Januvia.  Taking regularly.     Past Medical History  Diagnosis Date  . Arthritis   . Chicken pox   . Diabetes mellitus   . GERD (gastroesophageal reflux disease)   . Allergy     hay fever  . Hypertension   . Urinary incontinence     Current Outpatient Prescriptions on File Prior to Visit  Medication Sig Dispense Refill  . albuterol (PROVENTIL HFA;VENTOLIN HFA) 108 (90 BASE) MCG/ACT inhaler Inhale 2 puffs into the lungs every 6 (six) hours as needed for wheezing.  1 Inhaler  0  . aspirin 81 MG tablet Take 81 mg by mouth daily.      . cetirizine (ZYRTEC) 10 MG tablet Take 10 mg by mouth daily.      . fluticasone (FLONASE) 50 MCG/ACT nasal spray Place 2 sprays into the nose daily.  16 g  2  . fluticasone (FLOVENT HFA) 110 MCG/ACT inhaler Inhale 1 puff into the lungs 2 (two) times daily.  1 Inhaler  1  . hydrochlorothiazide (HYDRODIURIL) 25 MG tablet Take 1 tablet (25 mg total) by mouth daily.  90 tablet  3  . lisinopril (PRINIVIL,ZESTRIL) 40 MG tablet Take 1 tablet (40 mg total) by mouth daily.  90 tablet  3  . metFORMIN (GLUCOPHAGE) 1000 MG tablet Take 1,000 mg by mouth 2 (two) times daily with a meal.      . Multiple Vitamin (MULTIVITAMIN) tablet Take 1 tablet  by mouth daily.      Marland Kitchen omeprazole (PRILOSEC) 20 MG capsule Take 20 mg by mouth 2 (two) times daily.      Marland Kitchen oxybutynin (DITROPAN) 5 MG tablet Take 1 tablet (5 mg total) by mouth 2 (two) times daily.  180 tablet  3  . sitaGLIPtin (JANUVIA) 100 MG tablet Take 1 tablet (100 mg total) by mouth daily.  30 tablet  6   No current facility-administered medications on file prior to visit.    Review of Systems Patient denies any headache, lightheadedness or dizziness.  No chest pain or palpitations.  Breathing is better.  Cough is better.  No acid reflux, dysphagia or odynophagia.  No nausea or vomiting.  No abdominal pain or cramping.  No bowel change, such as diarrhea, constipation, BRBPR or melana.  No urine change.   Sugars as outlined.  Is followed at [facility].  Blood pressures averaging 120-122/70s.      Objective:   Physical Exam  Filed Vitals:   12/15/12 1108  BP: 120/70  Pulse: 88  Temp: 98.3 F (61.86 C)   57 year old female in no acute distress.   HEENT:  Nares- clear.  Oropharynx - without lesions. NECK:  Supple.  Nontender.  No audible bruit.  HEART:  Appears to be regular. LUNGS:  No crackles or wheezing audible.  Respirations even and unlabored.  RADIAL PULSE:  Equal bilaterally.   ABDOMEN:  Soft, nontender.  Bowel sounds present and normal.  No audible abdominal bruit.    EXTREMITIES:  No increased edema present.       Assessment & Plan:  HEALTH MAINTENANCE.  Physical 11/10/12.  Is s/p hysterectomy.  Obtain records to review - for colonoscopy.

## 2013-01-01 ENCOUNTER — Other Ambulatory Visit: Payer: Self-pay | Admitting: *Deleted

## 2013-01-01 MED ORDER — OMEPRAZOLE 20 MG PO CPDR: 20.0000 mg | DELAYED_RELEASE_CAPSULE | Freq: Two times a day (BID) | ORAL | Status: DC

## 2013-01-12 ENCOUNTER — Encounter: Payer: Self-pay | Admitting: Internal Medicine

## 2013-01-12 ENCOUNTER — Ambulatory Visit (INDEPENDENT_AMBULATORY_CARE_PROVIDER_SITE_OTHER): Payer: PRIVATE HEALTH INSURANCE | Admitting: Internal Medicine

## 2013-01-12 VITALS — BP 100/60 | HR 79 | Temp 98.1°F | Ht 63.75 in | Wt 286.4 lb

## 2013-01-12 DIAGNOSIS — E7800 Pure hypercholesterolemia, unspecified: Secondary | ICD-10-CM

## 2013-01-12 DIAGNOSIS — E119 Type 2 diabetes mellitus without complications: Secondary | ICD-10-CM

## 2013-01-12 DIAGNOSIS — I10 Essential (primary) hypertension: Secondary | ICD-10-CM

## 2013-01-12 DIAGNOSIS — E039 Hypothyroidism, unspecified: Secondary | ICD-10-CM

## 2013-01-12 DIAGNOSIS — R946 Abnormal results of thyroid function studies: Secondary | ICD-10-CM

## 2013-01-12 DIAGNOSIS — K219 Gastro-esophageal reflux disease without esophagitis: Secondary | ICD-10-CM

## 2013-01-12 DIAGNOSIS — R7989 Other specified abnormal findings of blood chemistry: Secondary | ICD-10-CM

## 2013-01-12 NOTE — Assessment & Plan Note (Signed)
Controlled on current regimen.  Same meds.         

## 2013-01-12 NOTE — Assessment & Plan Note (Signed)
Low cholesterol diet and exercise.  Follow lipid profile.    

## 2013-01-12 NOTE — Assessment & Plan Note (Signed)
Sugars doing better.  Continue current med regimen.  Follow.  Continue diet and exercise.

## 2013-01-12 NOTE — Assessment & Plan Note (Signed)
Recheck today to confirm normal.  Previous free t4 and free t3 wnl.

## 2013-01-12 NOTE — Assessment & Plan Note (Signed)
Controlled on Omeprazole 

## 2013-01-12 NOTE — Progress Notes (Signed)
Subjective:    Patient ID: Christine Robertson, female    DOB: 1956/04/11, 57 y.o.   MRN: [id number]  HPI 57 year old female with past history of diabetes, hypertension and GERD who comes in today to discuss her sugars.  Recent a1c elevated - 7.6.  Has been trying to do better - watching what she eats.  Is walking some.  Sugars improving.   Was treated two weeks ago for sinus infection.  Took a zpak.  Is better.  Some congestion, but overall feels she is doing well.  Discussed using saline and Flonase.   Breathing better.  No significant cough or congestion.  No nausea or vomiting.  On metformin, amaryl and Januvia.  Taking regularly.     Past Medical History  Diagnosis Date  . Arthritis   . Chicken pox   . Diabetes mellitus   . GERD (gastroesophageal reflux disease)   . Allergy     hay fever  . Hypertension   . Urinary incontinence     Current Outpatient Prescriptions on File Prior to Visit  Medication Sig Dispense Refill  . albuterol (PROVENTIL HFA;VENTOLIN HFA) 108 (90 BASE) MCG/ACT inhaler Inhale 2 puffs into the lungs every 6 (six) hours as needed for wheezing.  1 Inhaler  0  . aspirin 81 MG tablet Take 81 mg by mouth daily.      . cetirizine (ZYRTEC) 10 MG tablet Take 10 mg by mouth daily.      . fluticasone (FLONASE) 50 MCG/ACT nasal spray Place 2 sprays into the nose daily.  16 g  2  . fluticasone (FLOVENT HFA) 110 MCG/ACT inhaler Inhale 1 puff into the lungs 2 (two) times daily.  1 Inhaler  1  . glimepiride (AMARYL) 2 MG tablet 1/2 tablet per day  30 tablet  1  . hydrochlorothiazide (HYDRODIURIL) 25 MG tablet Take 1 tablet (25 mg total) by mouth daily.  90 tablet  3  . lisinopril (PRINIVIL,ZESTRIL) 40 MG tablet Take 1 tablet (40 mg total) by mouth daily.  90 tablet  3  . metFORMIN (GLUCOPHAGE) 1000 MG tablet Take 1,000 mg by mouth 2 (two) times daily with a meal.      . Multiple Vitamin (MULTIVITAMIN) tablet Take 1 tablet by mouth daily.      Marland Kitchen omeprazole (PRILOSEC) 20 MG capsule Take  1 capsule (20 mg total) by mouth 2 (two) times daily.  30 capsule  5  . oxybutynin (DITROPAN) 5 MG tablet Take 1 tablet (5 mg total) by mouth 2 (two) times daily.  180 tablet  3  . sitaGLIPtin (JANUVIA) 100 MG tablet Take 1 tablet (100 mg total) by mouth daily.  30 tablet  6   No current facility-administered medications on file prior to visit.    Review of Systems Patient denies any headache, lightheadedness or dizziness.  No chest pain or palpitations.  Breathing is better.  Sinus symptoms better.  No acid reflux, dysphagia or odynophagia.  No nausea or vomiting.  No abdominal pain or cramping.  No bowel change, such as diarrhea, constipation, BRBPR or melana.  No urine change.   Sugars doing better.   Is followed at [facility].  Watching what she eats.      Objective:   Physical Exam  Filed Vitals:   01/12/13 1522  BP: 100/60  Pulse: 79  Temp: 98.1 F (36.7 C)   Blood pressure recheck:  122/78, pulse 79  57 year old female in no acute distress.  HEENT:  Nares- clear.  Oropharynx - without lesions. NECK:  Supple.  Nontender.  No audible bruit.  HEART:  Appears to be regular. LUNGS:  No crackles or wheezing audible.  Respirations even and unlabored.  RADIAL PULSE:  Equal bilaterally.   ABDOMEN:  Soft, nontender.  Bowel sounds present and normal.  No audible abdominal bruit.    EXTREMITIES:  No increased edema present.       Assessment & Plan:  HEALTH MAINTENANCE.  Physical 11/10/12.  Is s/p hysterectomy.  Need records to review - for colonoscopy.

## 2013-01-13 ENCOUNTER — Encounter: Payer: Self-pay | Admitting: Internal Medicine

## 2013-01-22 ENCOUNTER — Ambulatory Visit: Payer: Self-pay | Admitting: Internal Medicine

## 2013-01-22 LAB — HM MAMMOGRAPHY

## 2013-01-24 ENCOUNTER — Encounter: Payer: Self-pay | Admitting: Internal Medicine

## 2013-02-11 ENCOUNTER — Other Ambulatory Visit: Payer: Self-pay | Admitting: *Deleted

## 2013-02-11 MED ORDER — METFORMIN HCL 1000 MG PO TABS: 1000.0000 mg | ORAL_TABLET | Freq: Two times a day (BID) | ORAL | Status: DC

## 2013-02-13 ENCOUNTER — Telehealth: Payer: Self-pay | Admitting: Internal Medicine

## 2013-02-13 NOTE — Telephone Encounter (Signed)
metFORMIN (GLUCOPHAGE) 1000 MG tablet  Please send to [facility] # [phone]

## 2013-02-13 NOTE — Telephone Encounter (Signed)
Pt informed to have pharmacy transfer medication-Rx was sent to [facility] on 02/11/13

## 2013-02-16 ENCOUNTER — Other Ambulatory Visit: Payer: Self-pay | Admitting: *Deleted

## 2013-02-16 MED ORDER — METFORMIN HCL 1000 MG PO TABS: 1000.0000 mg | ORAL_TABLET | Freq: Two times a day (BID) | ORAL | Status: DC

## 2013-02-18 ENCOUNTER — Other Ambulatory Visit: Payer: Self-pay | Admitting: *Deleted

## 2013-02-18 MED ORDER — GLIMEPIRIDE 2 MG PO TABS: ORAL_TABLET | ORAL | Status: DC

## 2013-03-07 ENCOUNTER — Other Ambulatory Visit: Payer: Self-pay | Admitting: Internal Medicine

## 2013-03-07 MED ORDER — METFORMIN HCL 1000 MG PO TABS: 1000.0000 mg | ORAL_TABLET | Freq: Two times a day (BID) | ORAL | Status: DC

## 2013-03-07 NOTE — Progress Notes (Signed)
Previous metformin rx sent to [facility].  Refill metformin #60 with 5 refills to [facility] - per request.

## 2013-03-16 ENCOUNTER — Ambulatory Visit: Payer: PRIVATE HEALTH INSURANCE | Admitting: Internal Medicine

## 2013-03-20 ENCOUNTER — Encounter: Payer: Self-pay | Admitting: Internal Medicine

## 2013-04-08 ENCOUNTER — Encounter: Payer: Self-pay | Admitting: *Deleted

## 2013-04-20 NOTE — Telephone Encounter (Signed)
Mailed unread message to pt  

## 2013-04-25 ENCOUNTER — Other Ambulatory Visit: Payer: Self-pay | Admitting: Internal Medicine

## 2013-04-27 ENCOUNTER — Other Ambulatory Visit: Payer: Self-pay | Admitting: Internal Medicine

## 2013-05-14 ENCOUNTER — Ambulatory Visit: Payer: PRIVATE HEALTH INSURANCE | Admitting: Internal Medicine

## 2013-06-07 ENCOUNTER — Other Ambulatory Visit: Payer: Self-pay | Admitting: Internal Medicine

## 2013-06-15 ENCOUNTER — Other Ambulatory Visit: Payer: Self-pay | Admitting: Internal Medicine

## 2013-06-16 NOTE — Telephone Encounter (Signed)
Eprescribed.

## 2013-06-23 ENCOUNTER — Encounter: Payer: Self-pay | Admitting: Internal Medicine

## 2013-06-23 ENCOUNTER — Ambulatory Visit (INDEPENDENT_AMBULATORY_CARE_PROVIDER_SITE_OTHER): Payer: PRIVATE HEALTH INSURANCE | Admitting: Internal Medicine

## 2013-06-23 VITALS — BP 110/70 | HR 84 | Temp 98.1°F | Ht 63.75 in | Wt 293.2 lb

## 2013-06-23 DIAGNOSIS — K219 Gastro-esophageal reflux disease without esophagitis: Secondary | ICD-10-CM

## 2013-06-23 DIAGNOSIS — R7989 Other specified abnormal findings of blood chemistry: Secondary | ICD-10-CM

## 2013-06-23 DIAGNOSIS — E119 Type 2 diabetes mellitus without complications: Secondary | ICD-10-CM

## 2013-06-23 DIAGNOSIS — I10 Essential (primary) hypertension: Secondary | ICD-10-CM

## 2013-06-23 DIAGNOSIS — R809 Proteinuria, unspecified: Secondary | ICD-10-CM

## 2013-06-23 DIAGNOSIS — I4891 Unspecified atrial fibrillation: Secondary | ICD-10-CM

## 2013-06-23 DIAGNOSIS — E7800 Pure hypercholesterolemia, unspecified: Secondary | ICD-10-CM

## 2013-06-23 DIAGNOSIS — R946 Abnormal results of thyroid function studies: Secondary | ICD-10-CM

## 2013-06-23 DIAGNOSIS — R32 Unspecified urinary incontinence: Secondary | ICD-10-CM

## 2013-06-23 DIAGNOSIS — I499 Cardiac arrhythmia, unspecified: Secondary | ICD-10-CM

## 2013-06-23 DIAGNOSIS — Z23 Encounter for immunization: Secondary | ICD-10-CM

## 2013-06-23 DIAGNOSIS — Q891 Congenital malformations of adrenal gland: Secondary | ICD-10-CM

## 2013-06-23 LAB — HM DIABETES FOOT EXAM

## 2013-06-23 NOTE — Progress Notes (Signed)
Subjective:    Patient ID: Christine Robertson, female    DOB: 01-10-1956, 57 y.o.   MRN: [id number]  HPI 57 year old female with past history of diabetes, hypertension and GERD who comes in today for a scheduled follow up.  Has been trying to do better - watching what she eats.  Is walking some.  States sugars improving.    Am sugars averaging 130s and sugars after lunch - 108-110.  No cough or congestion.  No nausea or vomiting.  On metformin, amaryl and Januvia.  Taking regularly.  Denies any chest pain or tightness. No increased heart rate or palpitations.  Overall feels she is doing relatively well.    Past Medical History  Diagnosis Date  . Arthritis   . Chicken pox   . Diabetes mellitus   . GERD (gastroesophageal reflux disease)   . Allergy     hay fever  . Hypertension   . Urinary incontinence     Current Outpatient Prescriptions on File Prior to Visit  Medication Sig Dispense Refill  . albuterol (PROVENTIL HFA;VENTOLIN HFA) 108 (90 BASE) MCG/ACT inhaler Inhale 2 puffs into the lungs every 6 (six) hours as needed for wheezing.  1 Inhaler  0  . aspirin 81 MG tablet Take 81 mg by mouth daily.      . cetirizine (ZYRTEC) 10 MG tablet Take 10 mg by mouth daily.      Marland Kitchen glimepiride (AMARYL) 2 MG tablet 1/2 tablet per day  30 tablet  2  . hydrochlorothiazide (HYDRODIURIL) 25 MG tablet TAKE 1 TABLET BY MOUTH EVERY DAY  90 tablet  0  . JANUVIA 100 MG tablet TAKE 1 TABLET BY MOUTH DAILY.  30 tablet  5  . lisinopril (PRINIVIL,ZESTRIL) 40 MG tablet TAKE ONE TABLET BY MOUTH DAILY  90 tablet  1  . metFORMIN (GLUCOPHAGE) 1000 MG tablet Take 1 tablet (1,000 mg total) by mouth 2 (two) times daily with a meal.  60 tablet  5  . Multiple Vitamin (MULTIVITAMIN) tablet Take 1 tablet by mouth daily.      Marland Kitchen omeprazole (PRILOSEC) 20 MG capsule Take 1 capsule (20 mg total) by mouth 2 (two) times daily.  30 capsule  5  . oxybutynin (DITROPAN) 5 MG tablet TAKE 1 TABLET BY MOUTH TWICE DAILY  180 tablet  0  .  [DISCONTINUED] oxybutynin (DITROPAN) 5 MG tablet Take 1 tablet (5 mg total) by mouth 2 (two) times daily.  180 tablet  3   No current facility-administered medications on file prior to visit.    Review of Systems Patient denies any headache, lightheadedness or dizziness.  No chest pain or palpitations.  No sinus or allergy symptoms.   No acid reflux, dysphagia or odynophagia.  No nausea or vomiting.  No abdominal pain or cramping.  No bowel change, such as diarrhea, constipation, BRBPR or melana.  No urine change.   Sugars doing better.   Is followed at [facility].  Watching what she eats.  Walking.  Sugars as outlined.     Objective:   Physical Exam  Filed Vitals:   06/23/13 1630  BP: 110/70  Pulse: 84  Temp: 98.1 F (36.7 C)   Blood pressure recheck:  122/78, pulse 69  57 year old female in no acute distress.   HEENT:  Nares- clear.  Oropharynx - without lesions. NECK:  Supple.  Nontender.  No audible bruit.  HEART:  Appears to be irregular.  Rate - 92 LUNGS:  No crackles  or wheezing audible.  Respirations even and unlabored.  RADIAL PULSE:  Equal bilaterally.   ABDOMEN:  Soft, nontender.  Bowel sounds present and normal.  No audible abdominal bruit.    EXTREMITIES:  No increased edema present.  DP pulses palpable and equal bilaterally.  FEET:  No lesions.       Assessment & Plan:  HEALTH MAINTENANCE.  Physical 11/10/12.  Is s/p hysterectomy.  Colonoscopy 02/20/10 - internal hemorrhoids otherwise normal.  Recommended follow up colonoscopy in five years.     I spent 40 minutes with the patient and more than 50% of the time was spent in consultation regarding the above.

## 2013-06-23 NOTE — Assessment & Plan Note (Addendum)
Previously suppressed tsh, but most recent check revealed tsh, free t3 and free t4 - wnl.  Recheck.

## 2013-06-23 NOTE — Assessment & Plan Note (Addendum)
Sugars doing better.  Continue current med regimen.  Follow.  Continue diet and exercise.  Check metabolic panel and a1c.

## 2013-06-24 ENCOUNTER — Encounter: Payer: Self-pay | Admitting: Internal Medicine

## 2013-06-24 ENCOUNTER — Other Ambulatory Visit (INDEPENDENT_AMBULATORY_CARE_PROVIDER_SITE_OTHER): Payer: PRIVATE HEALTH INSURANCE

## 2013-06-24 DIAGNOSIS — R946 Abnormal results of thyroid function studies: Secondary | ICD-10-CM

## 2013-06-24 DIAGNOSIS — R7989 Other specified abnormal findings of blood chemistry: Secondary | ICD-10-CM

## 2013-06-24 DIAGNOSIS — E7800 Pure hypercholesterolemia, unspecified: Secondary | ICD-10-CM

## 2013-06-24 DIAGNOSIS — Q891 Congenital malformations of adrenal gland: Secondary | ICD-10-CM | POA: Insufficient documentation

## 2013-06-24 DIAGNOSIS — I4891 Unspecified atrial fibrillation: Secondary | ICD-10-CM

## 2013-06-24 DIAGNOSIS — E119 Type 2 diabetes mellitus without complications: Secondary | ICD-10-CM

## 2013-06-24 DIAGNOSIS — R809 Proteinuria, unspecified: Secondary | ICD-10-CM | POA: Insufficient documentation

## 2013-06-24 LAB — CBC WITH DIFFERENTIAL/PLATELET
Basophils Relative: 0.4 % (ref 0.0–3.0)
Eosinophils Relative: 1.1 % (ref 0.0–5.0)
Hemoglobin: 12.3 g/dL (ref 12.0–15.0)
Lymphocytes Relative: 21.1 % (ref 12.0–46.0)
Monocytes Relative: 5.6 % (ref 3.0–12.0)
Neutro Abs: 5.8 10*3/uL (ref 1.4–7.7)
RBC: 4.23 Mil/uL (ref 3.87–5.11)
WBC: 8.1 10*3/uL (ref 4.5–10.5)

## 2013-06-24 LAB — COMPREHENSIVE METABOLIC PANEL
Albumin: 3.8 g/dL (ref 3.5–5.2)
BUN: 27 mg/dL — ABNORMAL HIGH (ref 6–23)
CO2: 29 mEq/L (ref 19–32)
Calcium: 9.6 mg/dL (ref 8.4–10.5)
Chloride: 105 mEq/L (ref 96–112)
Creatinine, Ser: 0.9 mg/dL (ref 0.4–1.2)
GFR: 72.71 mL/min (ref >60.00)
Potassium: 4.5 mEq/L (ref 3.5–5.1)

## 2013-06-24 LAB — LIPID PANEL
Cholesterol: 187 mg/dL (ref 0–200)
Total CHOL/HDL Ratio: 4
Triglycerides: 194 mg/dL — ABNORMAL HIGH (ref 0.0–149.0)

## 2013-06-24 NOTE — Assessment & Plan Note (Signed)
Blood pressure under good control.  Same medication regimen.  Follow.  Check metabolic panel.   

## 2013-06-24 NOTE — Assessment & Plan Note (Signed)
On exam, noted irregularity.  EKG obtained and revealed afib/aflutter with ventricular rate controlled (92).  Discussed at length with her today.  Discussed need for referral to cardiology for further w/up including probable echo, etc.  Will check labs including thyroid function.  She is taking an aspirin daily.  Will continue.  Will discuss with cardiology regarding the need for further treatment.

## 2013-06-24 NOTE — Assessment & Plan Note (Signed)
Remains on lisinopril.  Renal function normal.  Follow.

## 2013-06-24 NOTE — Assessment & Plan Note (Signed)
Low cholesterol diet and exercise.  Check lipid panel.   

## 2013-06-24 NOTE — Assessment & Plan Note (Signed)
On ditropan and doing well.  Follow.   

## 2013-06-24 NOTE — Assessment & Plan Note (Signed)
Controlled on prilosec.  Follow.  

## 2013-06-24 NOTE — Assessment & Plan Note (Signed)
Last MRI revealed a stable lesion (05/17/11).  Dr Savage reviewed the scan and felt no further f/u scans warranted.    

## 2013-06-25 ENCOUNTER — Encounter: Payer: Self-pay | Admitting: Internal Medicine

## 2013-06-29 NOTE — Telephone Encounter (Signed)
Mailed unread message to pt  

## 2013-07-07 ENCOUNTER — Encounter: Payer: Self-pay | Admitting: Internal Medicine

## 2013-07-08 ENCOUNTER — Encounter: Payer: Self-pay | Admitting: Internal Medicine

## 2013-08-04 ENCOUNTER — Encounter: Payer: Self-pay | Admitting: Internal Medicine

## 2013-08-04 DIAGNOSIS — I4891 Unspecified atrial fibrillation: Secondary | ICD-10-CM

## 2013-08-07 ENCOUNTER — Encounter: Payer: Self-pay | Admitting: Internal Medicine

## 2013-08-07 ENCOUNTER — Ambulatory Visit (INDEPENDENT_AMBULATORY_CARE_PROVIDER_SITE_OTHER): Payer: PRIVATE HEALTH INSURANCE | Admitting: Internal Medicine

## 2013-08-07 VITALS — BP 110/70 | HR 86 | Temp 97.9°F | Ht 63.75 in | Wt 291.5 lb

## 2013-08-07 DIAGNOSIS — E7800 Pure hypercholesterolemia, unspecified: Secondary | ICD-10-CM

## 2013-08-07 DIAGNOSIS — R350 Frequency of micturition: Secondary | ICD-10-CM

## 2013-08-07 DIAGNOSIS — Q891 Congenital malformations of adrenal gland: Secondary | ICD-10-CM

## 2013-08-07 DIAGNOSIS — R7989 Other specified abnormal findings of blood chemistry: Secondary | ICD-10-CM

## 2013-08-07 DIAGNOSIS — E119 Type 2 diabetes mellitus without complications: Secondary | ICD-10-CM

## 2013-08-07 DIAGNOSIS — R946 Abnormal results of thyroid function studies: Secondary | ICD-10-CM

## 2013-08-07 DIAGNOSIS — R809 Proteinuria, unspecified: Secondary | ICD-10-CM

## 2013-08-07 DIAGNOSIS — R32 Unspecified urinary incontinence: Secondary | ICD-10-CM

## 2013-08-07 DIAGNOSIS — K219 Gastro-esophageal reflux disease without esophagitis: Secondary | ICD-10-CM

## 2013-08-07 DIAGNOSIS — I10 Essential (primary) hypertension: Secondary | ICD-10-CM

## 2013-08-07 DIAGNOSIS — I4891 Unspecified atrial fibrillation: Secondary | ICD-10-CM

## 2013-08-07 DIAGNOSIS — R05 Cough: Secondary | ICD-10-CM

## 2013-08-07 LAB — URINALYSIS, ROUTINE W REFLEX MICROSCOPIC
Hgb urine dipstick: NEGATIVE
Ketones, ur: NEGATIVE
Nitrite: NEGATIVE
Total Protein, Urine: NEGATIVE
Urine Glucose: NEGATIVE
pH: 6 (ref 5.0–8.0)

## 2013-08-07 NOTE — Progress Notes (Signed)
Subjective:    Patient ID: Christine Robertson, female    DOB: April 14, 1956, 57 y.o.   MRN: [id number]  HPI 57 year old female with past history of diabetes, hypertension and GERD who comes in today for a scheduled follow up.   Is walking some.  Brought in no sugar readings.  States her sugars in the am are averaging 150 and pm sugars 145.  She has noticed some increased cough.  No chest congestion.  Some green mucus production.  Stuffy nose.  No sore throat.  Some wheezing.   No nausea or vomiting.  On metformin, amaryl and Januvia.  Taking regularly.  Denies any chest pain or tightness. No increased heart rate or palpitations.  Diagnosed with afib.  On metoprolol and Flecainide.  Started 08/03/13.  Planning to f/u with cardiology next week.  If does not respond and convert, planning for cardioversion.  Some previous LLQ discomfort.     Past Medical History  Diagnosis Date  . Arthritis   . Chicken pox   . Diabetes mellitus   . GERD (gastroesophageal reflux disease)   . Allergy     hay fever  . Hypertension   . Urinary incontinence     Current Outpatient Prescriptions on File Prior to Visit  Medication Sig Dispense Refill  . albuterol (PROVENTIL HFA;VENTOLIN HFA) 108 (90 BASE) MCG/ACT inhaler Inhale 2 puffs into the lungs every 6 (six) hours as needed for wheezing.  1 Inhaler  0  . cetirizine (ZYRTEC) 10 MG tablet Take 10 mg by mouth daily.      . fluticasone (FLONASE) 50 MCG/ACT nasal spray Place 2 sprays into the nose daily as needed.      . fluticasone (FLOVENT HFA) 110 MCG/ACT inhaler Inhale 1 puff into the lungs 2 (two) times daily as needed.      Marland Kitchen glimepiride (AMARYL) 2 MG tablet 1/2 tablet per day  30 tablet  2  . hydrochlorothiazide (HYDRODIURIL) 25 MG tablet TAKE 1 TABLET BY MOUTH EVERY DAY  90 tablet  0  . JANUVIA 100 MG tablet TAKE 1 TABLET BY MOUTH DAILY.  30 tablet  5  . metFORMIN (GLUCOPHAGE) 1000 MG tablet Take 1 tablet (1,000 mg total) by mouth 2 (two) times daily with a meal.  60  tablet  5  . Multiple Vitamin (MULTIVITAMIN) tablet Take 1 tablet by mouth daily.      Marland Kitchen omeprazole (PRILOSEC) 20 MG capsule Take 1 capsule (20 mg total) by mouth 2 (two) times daily.  30 capsule  5  . oxybutynin (DITROPAN) 5 MG tablet TAKE 1 TABLET BY MOUTH TWICE DAILY  180 tablet  0  . aspirin 81 MG tablet Take 81 mg by mouth daily.      . [DISCONTINUED] oxybutynin (DITROPAN) 5 MG tablet Take 1 tablet (5 mg total) by mouth 2 (two) times daily.  180 tablet  3   No current facility-administered medications on file prior to visit.    Review of Systems Patient denies any headache, lightheadedness or dizziness.  No chest pain or palpitations.  No sinus or allergy symptoms.   No acid reflux, dysphagia or odynophagia.  No nausea or vomiting.  No significant abdominal pain or cramping.  No bowel change, such as diarrhea, constipation, BRBPR or melana.  No urine change.   Sugars as outlined.   Is followed at [facility].      Objective:   Physical Exam  Filed Vitals:   08/07/13 0945  BP: 110/70  Pulse:  86  Temp: 97.9 F (36.6 C)   Blood pressure recheck:  120/74, pulse 50  57 year old female in no acute distress.   HEENT:  Nares- clear.  Oropharynx - without lesions. NECK:  Supple.  Nontender.  No audible bruit.  HEART:  Appears to be irregular.  LUNGS:  No crackles or wheezing audible.  Respirations even and unlabored.  RADIAL PULSE:  Equal bilaterally.   ABDOMEN:  Soft, nontender.  Bowel sounds present and normal.  No audible abdominal bruit.    EXTREMITIES:  No increased edema present.  DP pulses palpable and equal bilaterally.  FEET:  No lesions.       Assessment & Plan:  LOWER ABDOMINAL PRESSURE.  Not severe.  Concern over possible uti.  Check urinalysis and urine culture.    HEALTH MAINTENANCE.  Physical 11/10/12.  Is s/p hysterectomy.  Colonoscopy 02/20/10 - internal hemorrhoids otherwise normal.  Recommended follow up colonoscopy in five years.     I spent 40 minutes  with the patient and more than 50% of the time was spent in consultation regarding the above.

## 2013-08-07 NOTE — Progress Notes (Signed)
Pre-visit discussion using our clinic review tool. No additional management support is needed unless otherwise documented below in the visit note.  

## 2013-08-07 NOTE — Assessment & Plan Note (Addendum)
Blood pressure under good control.  Same medication regimen.  Follow.  Follow metabolic panel.     

## 2013-08-07 NOTE — Assessment & Plan Note (Addendum)
Sugars doing better.  Continue current med regimen.  Follow.  Continue diet and exercise.  Follow metabolic panel and a1c.

## 2013-08-09 ENCOUNTER — Encounter: Payer: Self-pay | Admitting: Internal Medicine

## 2013-08-09 DIAGNOSIS — R21 Rash and other nonspecific skin eruption: Secondary | ICD-10-CM | POA: Insufficient documentation

## 2013-08-09 NOTE — Assessment & Plan Note (Signed)
Low cholesterol diet and exercise.  Check lipid panel.   

## 2013-08-09 NOTE — Assessment & Plan Note (Signed)
On ditropan and doing well.  Follow.   

## 2013-08-09 NOTE — Assessment & Plan Note (Signed)
On eloquis, metoprolol and Flecainide.  If no response, then will need cardioversion.

## 2013-08-09 NOTE — Assessment & Plan Note (Signed)
Previously suppressed tsh, but most recent check revealed tsh, free t3 and free t4 - wnl.  Follow.    

## 2013-08-09 NOTE — Assessment & Plan Note (Signed)
Remains on lisinopril.  Renal function normal.  Follow.   

## 2013-08-09 NOTE — Assessment & Plan Note (Signed)
Last MRI revealed a stable lesion (05/17/11).  Dr Savage reviewed the scan and felt no further f/u scans warranted.    

## 2013-08-09 NOTE — Assessment & Plan Note (Signed)
Controlled on prilosec.  Follow.  

## 2013-08-09 NOTE — Assessment & Plan Note (Signed)
Some cough and wheezing.  Needs to restart her steroid inhaler.  Take regularly.  proventil as needed.  Follow.  Robitussin as directed.  Follow.

## 2013-08-10 ENCOUNTER — Other Ambulatory Visit (INDEPENDENT_AMBULATORY_CARE_PROVIDER_SITE_OTHER): Payer: PRIVATE HEALTH INSURANCE

## 2013-08-10 ENCOUNTER — Other Ambulatory Visit: Payer: Self-pay | Admitting: Internal Medicine

## 2013-08-10 DIAGNOSIS — R109 Unspecified abdominal pain: Secondary | ICD-10-CM

## 2013-08-10 LAB — CULTURE, URINE COMPREHENSIVE: Colony Count: 60000

## 2013-08-10 NOTE — Progress Notes (Signed)
Order placed for f/u u/a and culture.

## 2013-08-11 LAB — URINALYSIS, ROUTINE W REFLEX MICROSCOPIC
Hgb urine dipstick: NEGATIVE
Leukocytes, UA: NEGATIVE
RBC / HPF: NONE SEEN (ref 0–?)
Specific Gravity, Urine: 1.025 (ref 1.000–1.030)
Total Protein, Urine: NEGATIVE
Urobilinogen, UA: 0.2 (ref 0.0–1.0)

## 2013-08-12 ENCOUNTER — Encounter: Payer: Self-pay | Admitting: Internal Medicine

## 2013-08-12 LAB — CULTURE, URINE COMPREHENSIVE: Organism ID, Bacteria: NO GROWTH

## 2013-08-13 ENCOUNTER — Encounter: Payer: Self-pay | Admitting: Internal Medicine

## 2013-08-13 ENCOUNTER — Ambulatory Visit (INDEPENDENT_AMBULATORY_CARE_PROVIDER_SITE_OTHER): Payer: PRIVATE HEALTH INSURANCE | Admitting: Internal Medicine

## 2013-08-13 ENCOUNTER — Telehealth: Payer: Self-pay | Admitting: *Deleted

## 2013-08-13 VITALS — BP 120/80 | HR 108 | Temp 100.0°F | Ht 63.75 in | Wt 289.5 lb

## 2013-08-13 DIAGNOSIS — I4891 Unspecified atrial fibrillation: Secondary | ICD-10-CM

## 2013-08-13 DIAGNOSIS — J069 Acute upper respiratory infection, unspecified: Secondary | ICD-10-CM

## 2013-08-13 DIAGNOSIS — R509 Fever, unspecified: Secondary | ICD-10-CM

## 2013-08-13 LAB — POCT INFLUENZA A/B: Influenza A, POC: NEGATIVE

## 2013-08-13 MED ORDER — OSELTAMIVIR PHOSPHATE 75 MG PO CAPS: 75.0000 mg | ORAL_CAPSULE | Freq: Two times a day (BID) | ORAL | Status: DC

## 2013-08-13 MED ORDER — DOXYCYCLINE HYCLATE 100 MG PO TABS: 100.0000 mg | ORAL_TABLET | Freq: Two times a day (BID) | ORAL | Status: DC

## 2013-08-13 NOTE — Progress Notes (Signed)
Pre-visit discussion using our clinic review tool. No additional management support is needed unless otherwise documented below in the visit note.  

## 2013-08-13 NOTE — Telephone Encounter (Signed)
C/O fever of 100.4, nasal/head congestion, & cough. Scheduled for a heart conversion on Monday with Dr. Lady Gary. Wants to know if she needs to be seen? If so, when?

## 2013-08-13 NOTE — Telephone Encounter (Signed)
See if she can come in at 1:45.  Work in for this problem.

## 2013-08-13 NOTE — Telephone Encounter (Signed)
Pt notified, will be seen at 1:45 today

## 2013-08-15 ENCOUNTER — Encounter: Payer: Self-pay | Admitting: Internal Medicine

## 2013-08-15 LAB — CULTURE, GROUP A STREP

## 2013-08-16 ENCOUNTER — Encounter: Payer: Self-pay | Admitting: Internal Medicine

## 2013-08-16 NOTE — Progress Notes (Signed)
Subjective:    Patient ID: Christine Robertson, female    DOB: 05-16-1956, 57 y.o.   MRN: [id number]  Cough Associated symptoms include a fever.  Fever  Associated symptoms include coughing.  57 year old female with past history of diabetes, hypertension and GERD who comes in today as a work in with concerns regarding increased sore throat, congestion and cough.  States symptoms started over the last 1-2 days.  Tmax 100.6.  Had flu shot one month ago.  Sore throat.  Previous headache.  Minimal now.  Some cough.  No significant productive cough.  Some wheezing.  No sob.  No chest tightness.  Ate soup today.  No vomiting.      Past Medical History  Diagnosis Date  . Arthritis   . Chicken pox   . Diabetes mellitus   . GERD (gastroesophageal reflux disease)   . Allergy     hay fever  . Hypertension   . Urinary incontinence     Current Outpatient Prescriptions on File Prior to Visit  Medication Sig Dispense Refill  . albuterol (PROVENTIL HFA;VENTOLIN HFA) 108 (90 BASE) MCG/ACT inhaler Inhale 2 puffs into the lungs every 6 (six) hours as needed for wheezing.  1 Inhaler  0  . cetirizine (ZYRTEC) 10 MG tablet Take 10 mg by mouth daily.      . fluticasone (FLONASE) 50 MCG/ACT nasal spray Place 2 sprays into the nose daily as needed.      . fluticasone (FLOVENT HFA) 110 MCG/ACT inhaler Inhale 1 puff into the lungs 2 (two) times daily as needed.      Marland Kitchen glimepiride (AMARYL) 2 MG tablet 1/2 tablet per day  30 tablet  2  . hydrochlorothiazide (HYDRODIURIL) 25 MG tablet TAKE 1 TABLET BY MOUTH EVERY DAY  90 tablet  0  . JANUVIA 100 MG tablet TAKE 1 TABLET BY MOUTH DAILY.  30 tablet  5  . metFORMIN (GLUCOPHAGE) 1000 MG tablet Take 1 tablet (1,000 mg total) by mouth 2 (two) times daily with a meal.  60 tablet  5  . metoprolol tartrate (LOPRESSOR) 25 MG tablet Take 25 mg by mouth 2 (two) times daily.      . Multiple Vitamin (MULTIVITAMIN) tablet Take 1 tablet by mouth daily.      Marland Kitchen omeprazole (PRILOSEC) 20 MG  capsule Take 1 capsule (20 mg total) by mouth 2 (two) times daily.  30 capsule  5  . oxybutynin (DITROPAN) 5 MG tablet TAKE 1 TABLET BY MOUTH TWICE DAILY  180 tablet  0  . aspirin 81 MG tablet Take 81 mg by mouth daily.      . [DISCONTINUED] oxybutynin (DITROPAN) 5 MG tablet Take 1 tablet (5 mg total) by mouth 2 (two) times daily.  180 tablet  3   No current facility-administered medications on file prior to visit.    Review of Systems  Constitutional: Positive for fever.  Respiratory: Positive for cough.   Patient denies any  lightheadedness or dizziness.  Minimal headache now.  Sore throat as outlined.  Fever.  Cough - non productive.  Some wheezing.  No chest tightness or sob.   No acid reflux, dysphagia or odynophagia.  No nausea or vomiting.  No significant abdominal pain or cramping.  No bowel change, such as diarrhea.       Objective:   Physical Exam  Filed Vitals:   08/13/13 1355  BP: 120/80  Pulse: 108  Temp: 100 F (37.8 C)   Pulse recheck:  96-100  57 year old female in no acute distress.   HEENT:  Nares- slightly erythematous turbinates.  Oropharynx - without lesions.  No sinus tenderness to palpation.   NECK:  Supple.  Nontender.  No audible bruit.  HEART:  Appears to be irregular.  LUNGS:  No crackles or wheezing audible.  Respirations even and unlabored.  Increased cough with expiration.  RADIAL PULSE:  Equal bilaterally.         Assessment & Plan:  HEALTH MAINTENANCE.  Physical 11/10/12.  Is s/p hysterectomy.  Colonoscopy 02/20/10 - internal hemorrhoids otherwise normal.  Recommended follow up colonoscopy in five years.

## 2013-08-16 NOTE — Assessment & Plan Note (Signed)
Increased fever, cough and congestion as outlined.  Flu swab negative.  Rapid strep negative.  Treat with doxycycline as directed.  Flovent inhaler and the rescue inhaler as directed.  Mucinex and robitussin as directed.  Rest.  Fluids.  Explained to her if symptoms change, worsen or do not resolve - she is to be reevaluated.

## 2013-08-16 NOTE — Assessment & Plan Note (Signed)
Seeing Dr Lady Gary.  Planning for cardioversion Monday.  Instructed to notify cardiology of current infection and probable need for postponing the cardioversion.

## 2013-08-17 NOTE — Telephone Encounter (Signed)
Mailed unread message to pt  

## 2013-09-08 ENCOUNTER — Ambulatory Visit: Payer: Self-pay | Admitting: Cardiology

## 2013-09-10 ENCOUNTER — Other Ambulatory Visit: Payer: Self-pay | Admitting: Internal Medicine

## 2013-09-16 ENCOUNTER — Other Ambulatory Visit: Payer: Self-pay | Admitting: Internal Medicine

## 2013-10-13 ENCOUNTER — Other Ambulatory Visit: Payer: Self-pay | Admitting: Internal Medicine

## 2013-10-22 ENCOUNTER — Other Ambulatory Visit: Payer: Self-pay | Admitting: Internal Medicine

## 2013-11-11 ENCOUNTER — Other Ambulatory Visit: Payer: PRIVATE HEALTH INSURANCE

## 2013-11-16 ENCOUNTER — Encounter: Payer: PRIVATE HEALTH INSURANCE | Admitting: Internal Medicine

## 2013-12-06 ENCOUNTER — Other Ambulatory Visit: Payer: Self-pay | Admitting: Internal Medicine

## 2014-01-11 DIAGNOSIS — I10 Essential (primary) hypertension: Secondary | ICD-10-CM | POA: Insufficient documentation

## 2014-01-15 ENCOUNTER — Other Ambulatory Visit (INDEPENDENT_AMBULATORY_CARE_PROVIDER_SITE_OTHER): Payer: PRIVATE HEALTH INSURANCE

## 2014-01-15 DIAGNOSIS — E7800 Pure hypercholesterolemia, unspecified: Secondary | ICD-10-CM

## 2014-01-15 DIAGNOSIS — E119 Type 2 diabetes mellitus without complications: Secondary | ICD-10-CM

## 2014-01-15 LAB — HEPATIC FUNCTION PANEL
ALT: 36 U/L — ABNORMAL HIGH (ref 0–35)
AST: 29 U/L (ref 0–37)
Albumin: 3.8 g/dL (ref 3.5–5.2)
Alkaline Phosphatase: 48 U/L (ref 39–117)
BILIRUBIN DIRECT: 0 mg/dL (ref 0.0–0.3)
TOTAL PROTEIN: 6.9 g/dL (ref 6.0–8.3)
Total Bilirubin: 0.7 mg/dL (ref 0.2–1.2)

## 2014-01-15 LAB — MICROALBUMIN / CREATININE URINE RATIO
CREATININE, U: 182.4 mg/dL
Microalb Creat Ratio: 0.3 mg/g (ref 0.0–30.0)
Microalb, Ur: 0.6 mg/dL (ref 0.0–1.9)

## 2014-01-15 LAB — BASIC METABOLIC PANEL
BUN: 25 mg/dL — AB (ref 6–23)
CHLORIDE: 102 meq/L (ref 96–112)
CO2: 28 mEq/L (ref 19–32)
Calcium: 9.5 mg/dL (ref 8.4–10.5)
Creatinine, Ser: 0.8 mg/dL (ref 0.4–1.2)
GFR: 73.56 mL/min (ref >60.00)
Glucose, Bld: 139 mg/dL — ABNORMAL HIGH (ref 70–99)
POTASSIUM: 4.2 meq/L (ref 3.5–5.1)
SODIUM: 140 meq/L (ref 135–145)

## 2014-01-15 LAB — LIPID PANEL
CHOLESTEROL: 180 mg/dL (ref 0–200)
HDL: 51.1 mg/dL (ref >39.00)
LDL CALC: 100 mg/dL — AB (ref 0–99)
Total CHOL/HDL Ratio: 4
Triglycerides: 147 mg/dL (ref 0.0–149.0)
VLDL: 29.4 mg/dL (ref 0.0–40.0)

## 2014-01-15 LAB — HEMOGLOBIN A1C: HEMOGLOBIN A1C: 7.4 % — AB (ref 4.6–6.5)

## 2014-01-21 ENCOUNTER — Encounter: Payer: Self-pay | Admitting: Internal Medicine

## 2014-01-21 ENCOUNTER — Ambulatory Visit (INDEPENDENT_AMBULATORY_CARE_PROVIDER_SITE_OTHER): Payer: PRIVATE HEALTH INSURANCE | Admitting: Internal Medicine

## 2014-01-21 VITALS — BP 110/70 | HR 62 | Temp 98.2°F | Ht 63.0 in | Wt 294.5 lb

## 2014-01-21 DIAGNOSIS — I10 Essential (primary) hypertension: Secondary | ICD-10-CM

## 2014-01-21 DIAGNOSIS — R21 Rash and other nonspecific skin eruption: Secondary | ICD-10-CM

## 2014-01-21 DIAGNOSIS — R809 Proteinuria, unspecified: Secondary | ICD-10-CM

## 2014-01-21 DIAGNOSIS — R946 Abnormal results of thyroid function studies: Secondary | ICD-10-CM

## 2014-01-21 DIAGNOSIS — K219 Gastro-esophageal reflux disease without esophagitis: Secondary | ICD-10-CM

## 2014-01-21 DIAGNOSIS — Q891 Congenital malformations of adrenal gland: Secondary | ICD-10-CM

## 2014-01-21 DIAGNOSIS — I4891 Unspecified atrial fibrillation: Secondary | ICD-10-CM

## 2014-01-21 DIAGNOSIS — R5383 Other fatigue: Secondary | ICD-10-CM

## 2014-01-21 DIAGNOSIS — R5381 Other malaise: Secondary | ICD-10-CM

## 2014-01-21 DIAGNOSIS — R32 Unspecified urinary incontinence: Secondary | ICD-10-CM

## 2014-01-21 DIAGNOSIS — G4733 Obstructive sleep apnea (adult) (pediatric): Secondary | ICD-10-CM

## 2014-01-21 DIAGNOSIS — E119 Type 2 diabetes mellitus without complications: Secondary | ICD-10-CM

## 2014-01-21 DIAGNOSIS — R7989 Other specified abnormal findings of blood chemistry: Secondary | ICD-10-CM

## 2014-01-21 DIAGNOSIS — E7800 Pure hypercholesterolemia, unspecified: Secondary | ICD-10-CM

## 2014-01-21 DIAGNOSIS — Z1239 Encounter for other screening for malignant neoplasm of breast: Secondary | ICD-10-CM

## 2014-01-21 MED ORDER — CLOTRIMAZOLE-BETAMETHASONE 1-0.05 % EX CREA: 1.0000 "application " | TOPICAL_CREAM | Freq: Two times a day (BID) | CUTANEOUS | Status: DC

## 2014-01-21 NOTE — Progress Notes (Signed)
Pre visit review using our clinic review tool, if applicable. No additional management support is needed unless otherwise documented below in the visit note. 

## 2014-01-25 ENCOUNTER — Encounter: Payer: Self-pay | Admitting: Internal Medicine

## 2014-01-25 ENCOUNTER — Telehealth: Payer: Self-pay | Admitting: Internal Medicine

## 2014-01-25 DIAGNOSIS — R5381 Other malaise: Secondary | ICD-10-CM | POA: Insufficient documentation

## 2014-01-25 DIAGNOSIS — R5383 Other fatigue: Secondary | ICD-10-CM | POA: Insufficient documentation

## 2014-01-25 DIAGNOSIS — G4733 Obstructive sleep apnea (adult) (pediatric): Secondary | ICD-10-CM | POA: Insufficient documentation

## 2014-01-25 NOTE — Assessment & Plan Note (Signed)
Low cholesterol diet and exercise.  Follow lipid panel.   

## 2014-01-25 NOTE — Assessment & Plan Note (Signed)
Blood pressure under good control.  Same medication regimen.  Follow.  Follow metabolic panel.     

## 2014-01-25 NOTE — Assessment & Plan Note (Signed)
Back on lisinopril.  Renal function normal.  Follow.

## 2014-01-25 NOTE — Assessment & Plan Note (Signed)
Seeing Dr Lady Gary.  Currently doing well.  Follow.

## 2014-01-25 NOTE — Assessment & Plan Note (Signed)
On ditropan and doing well.  Follow.

## 2014-01-25 NOTE — Assessment & Plan Note (Signed)
Probably multifactorial.  Contact [facility] as outlined.  Check routine labs.  Need to monitor diet and exercise.  Further w/up pending.

## 2014-01-25 NOTE — Progress Notes (Signed)
Subjective:    Patient ID: Christine Robertson, female    DOB: 07/22/1956, 58 y.o.   MRN: [id number]  HPI 58 year old female with past history of diabetes, hypertension and GERD who comes in today to follow up on these issues as well as for a complete physical exam.   Brought in no sugar readings.  States her sugars in the am are averaging 140 and pm sugars 140s.  Not exercising.  We discussed this today.  Discussed the importance of diet and exercise.  She does report some increased fatigue.  Does not feel rested when she wakes up.  Using her CPAP.  Does not fill the mask fits appropriately.  No nausea or vomiting.  On metformin, amaryl and Januvia.  Taking regularly.  Denies any chest pain or tightness.  No increased heart rate or palpitations.  Diagnosed with afib.  On metoprolol and Flecainide.  Following with cardiology.  She is back on lisinopril now.  Taking aspirin.  Does have some pain in her left hip.  Hurt when she walks or when she lies down.  No known injury.  She also reports a persistent rast left top ankle.  Some itching.     Past Medical History  Diagnosis Date  . Arthritis   . Chicken pox   . Diabetes mellitus   . GERD (gastroesophageal reflux disease)   . Allergy     hay fever  . Hypertension   . Urinary incontinence     Current Outpatient Prescriptions on File Prior to Visit  Medication Sig Dispense Refill  . albuterol (PROVENTIL HFA;VENTOLIN HFA) 108 (90 BASE) MCG/ACT inhaler Inhale 2 puffs into the lungs every 6 (six) hours as needed for wheezing.  1 Inhaler  0  . aspirin 81 MG tablet Take 81 mg by mouth daily.      . cetirizine (ZYRTEC) 10 MG tablet Take 10 mg by mouth daily.      . flecainide (TAMBOCOR) 100 MG tablet Take 100 mg by mouth 2 (two) times daily.      . fluticasone (FLONASE) 50 MCG/ACT nasal spray Place 2 sprays into the nose daily as needed.      . fluticasone (FLOVENT HFA) 110 MCG/ACT inhaler Inhale 1 puff into the lungs 2 (two) times daily as needed.      Marland Kitchen.  glimepiride (AMARYL) 2 MG tablet TAKE 1/2 TABLET BY MOUTH EVERY DAY  30 tablet  5  . hydrochlorothiazide (HYDRODIURIL) 25 MG tablet TAKE 1 TABLET BY MOUTH EVERY DAY.  90 tablet  0  . JANUVIA 100 MG tablet TAKE 1 TABLET BY MOUTH EVERY DAY  30 tablet  5  . metFORMIN (GLUCOPHAGE) 1000 MG tablet Take 1 tablet (1,000 mg total) by mouth 2 (two) times daily with a meal.  60 tablet  5  . metoprolol tartrate (LOPRESSOR) 25 MG tablet Take 25 mg by mouth 2 (two) times daily.      . Multiple Vitamin (MULTIVITAMIN) tablet Take 1 tablet by mouth daily.      Marland Kitchen. omeprazole (PRILOSEC) 20 MG capsule TAKE 1 CAPSULE BY MOUTH TWICE DAILY  60 capsule  5  . oxybutynin (DITROPAN) 5 MG tablet TAKE 1 TABLET BY MOUTH TWICE DAILY  180 tablet  1  . [DISCONTINUED] oxybutynin (DITROPAN) 5 MG tablet Take 1 tablet (5 mg total) by mouth 2 (two) times daily.  180 tablet  3   No current facility-administered medications on file prior to visit.    Review of Systems Patient denies  any headache, lightheadedness or dizziness.  No sinus or allergy symptoms.  No chest pain or palpitations.   No acid reflux, dysphagia or odynophagia.  No nausea or vomiting.  No significant abdominal pain or cramping.  No bowel change, such as diarrhea, constipation, BRBPR or melana.  No urine change.   Sugars as outlined.   Is followed at [facility].  Some left hip pain as outlined.  Some fatigue.      Objective:   Physical Exam  Filed Vitals:   01/21/14 1533  BP: 110/70  Pulse: 62  Temp: 98.2 F (30.48 C)   58 year old female in no acute distress.   HEENT:  Nares- clear.  Oropharynx - without lesions. NECK:  Supple.  Nontender.  No audible bruit.  HEART:  Rate controlled.  LUNGS:  No crackles or wheezing audible.  Respirations even and unlabored.  RADIAL PULSE:  Equal bilaterally.    BREASTS:  No nipple discharge or nipple retraction present.  Could not appreciate any distinct nodules or axillary adenopathy.  ABDOMEN:  Soft,  nontender.  Bowel sounds present and normal.  No audible abdominal bruit.  GU:  Not performed.     EXTREMITIES:  No increased edema present.  DP pulses palpable and equal bilaterally.      FEET:  No lesions.  Did have rash over the left top ankle.  Oval shaped - erythematous base.      Assessment & Plan:  HEALTH MAINTENANCE.  Physical today.  Is s/p hysterectomy.  Colonoscopy 02/20/10 - internal hemorrhoids otherwise normal.  Recommended follow up colonoscopy in five years.  IFOB.     I spent 40 minutes with the patient and more than 50% of the time was spent in consultation regarding the above.

## 2014-01-25 NOTE — Telephone Encounter (Signed)
Pt has known sleep apnea.  Has CPAP.  Please contact [facility].  She is having increased fatigue and waking up feeling like she has not rested.  Is concerned about her fact mask not fitting properly and need to see if we can arrange an autotitration.

## 2014-01-25 NOTE — Assessment & Plan Note (Signed)
Last MRI revealed a stable lesion (05/17/11).  Dr Lin Givens reviewed the scan and felt no further f/u scans warranted.

## 2014-01-25 NOTE — Assessment & Plan Note (Signed)
Localized to top of her ankle/foot.  Treat with lotrisone.  Follow.  Notify me if persistent.

## 2014-01-25 NOTE — Assessment & Plan Note (Signed)
Sugars as outlined.  Brought in no recorded sugar readings.  Continue current med regimen.  Follow.  Instructed on the importance of diet and exercise.  Follow metabolic panel and a1c.   

## 2014-01-25 NOTE — Assessment & Plan Note (Signed)
Controlled on prilosec.  Follow.  

## 2014-01-25 NOTE — Assessment & Plan Note (Signed)
Using her CPAP regularly.  Wakes up not feeling rested.  Will have [facility] evaluate and confirm mask fits appropriately.  Also see if can arrange an autotitration to confirm correct settings.

## 2014-01-25 NOTE — Assessment & Plan Note (Signed)
Previously suppressed tsh, but most recent check revealed tsh, free t3 and free t4 - wnl.  Follow.

## 2014-01-27 ENCOUNTER — Encounter: Payer: Self-pay | Admitting: *Deleted

## 2014-01-27 NOTE — Telephone Encounter (Signed)
Sent info to [facility] via letter & fax

## 2014-02-19 ENCOUNTER — Other Ambulatory Visit: Payer: Self-pay | Admitting: Internal Medicine

## 2014-03-14 ENCOUNTER — Other Ambulatory Visit: Payer: Self-pay | Admitting: Internal Medicine

## 2014-03-15 ENCOUNTER — Ambulatory Visit: Payer: Self-pay | Admitting: Internal Medicine

## 2014-03-15 LAB — HM MAMMOGRAPHY: HM Mammogram: NEGATIVE

## 2014-03-16 ENCOUNTER — Encounter: Payer: Self-pay | Admitting: *Deleted

## 2014-03-19 ENCOUNTER — Encounter: Payer: Self-pay | Admitting: Internal Medicine

## 2014-03-22 ENCOUNTER — Encounter: Payer: Self-pay | Admitting: Internal Medicine

## 2014-03-24 NOTE — Telephone Encounter (Signed)
Mailed unread message to pt  

## 2014-04-03 ENCOUNTER — Other Ambulatory Visit: Payer: Self-pay | Admitting: Internal Medicine

## 2014-04-14 ENCOUNTER — Telehealth: Payer: Self-pay | Admitting: Internal Medicine

## 2014-04-14 NOTE — Telephone Encounter (Signed)
Pt sent my chart message regarding f/u - to see if sleep med has f/u with her.

## 2014-04-14 NOTE — Telephone Encounter (Signed)
Sent pt a my chart message regarding her f/u sleep evaluation and mask, supplies, etc.

## 2014-04-16 NOTE — Telephone Encounter (Signed)
Unread mychart message mailed to patient 

## 2014-04-26 NOTE — Telephone Encounter (Signed)
Unread mychart message mailed to patient 

## 2014-05-02 ENCOUNTER — Other Ambulatory Visit: Payer: Self-pay | Admitting: Internal Medicine

## 2014-05-03 ENCOUNTER — Ambulatory Visit: Payer: PRIVATE HEALTH INSURANCE | Admitting: Internal Medicine

## 2014-05-09 ENCOUNTER — Other Ambulatory Visit: Payer: Self-pay | Admitting: Internal Medicine

## 2014-06-01 ENCOUNTER — Other Ambulatory Visit: Payer: Self-pay | Admitting: Internal Medicine

## 2014-06-06 ENCOUNTER — Other Ambulatory Visit: Payer: Self-pay | Admitting: Internal Medicine

## 2014-06-07 ENCOUNTER — Encounter: Payer: Self-pay | Admitting: Internal Medicine

## 2014-06-13 ENCOUNTER — Other Ambulatory Visit: Payer: Self-pay | Admitting: Internal Medicine

## 2014-06-24 ENCOUNTER — Ambulatory Visit (INDEPENDENT_AMBULATORY_CARE_PROVIDER_SITE_OTHER): Payer: PRIVATE HEALTH INSURANCE | Admitting: Internal Medicine

## 2014-06-24 ENCOUNTER — Encounter: Payer: Self-pay | Admitting: Internal Medicine

## 2014-06-24 VITALS — BP 110/70 | HR 70 | Temp 98.0°F | Ht 63.0 in | Wt 297.8 lb

## 2014-06-24 DIAGNOSIS — R209 Unspecified disturbances of skin sensation: Secondary | ICD-10-CM

## 2014-06-24 DIAGNOSIS — G4733 Obstructive sleep apnea (adult) (pediatric): Secondary | ICD-10-CM

## 2014-06-24 DIAGNOSIS — E7800 Pure hypercholesterolemia, unspecified: Secondary | ICD-10-CM

## 2014-06-24 DIAGNOSIS — I10 Essential (primary) hypertension: Secondary | ICD-10-CM

## 2014-06-24 DIAGNOSIS — K219 Gastro-esophageal reflux disease without esophagitis: Secondary | ICD-10-CM

## 2014-06-24 DIAGNOSIS — M79671 Pain in right foot: Secondary | ICD-10-CM

## 2014-06-24 DIAGNOSIS — I4891 Unspecified atrial fibrillation: Secondary | ICD-10-CM

## 2014-06-24 DIAGNOSIS — R208 Other disturbances of skin sensation: Secondary | ICD-10-CM

## 2014-06-24 DIAGNOSIS — M79672 Pain in left foot: Secondary | ICD-10-CM

## 2014-06-24 DIAGNOSIS — E118 Type 2 diabetes mellitus with unspecified complications: Secondary | ICD-10-CM

## 2014-06-24 NOTE — Progress Notes (Signed)
Pre visit review using our clinic review tool, if applicable. No additional management support is needed unless otherwise documented below in the visit note. 

## 2014-06-25 ENCOUNTER — Other Ambulatory Visit (INDEPENDENT_AMBULATORY_CARE_PROVIDER_SITE_OTHER): Payer: PRIVATE HEALTH INSURANCE

## 2014-06-25 DIAGNOSIS — E7800 Pure hypercholesterolemia, unspecified: Secondary | ICD-10-CM

## 2014-06-25 DIAGNOSIS — R208 Other disturbances of skin sensation: Secondary | ICD-10-CM

## 2014-06-25 DIAGNOSIS — R209 Unspecified disturbances of skin sensation: Secondary | ICD-10-CM | POA: Insufficient documentation

## 2014-06-25 DIAGNOSIS — E119 Type 2 diabetes mellitus without complications: Secondary | ICD-10-CM

## 2014-06-25 DIAGNOSIS — R5383 Other fatigue: Secondary | ICD-10-CM

## 2014-06-25 DIAGNOSIS — M79671 Pain in right foot: Secondary | ICD-10-CM | POA: Insufficient documentation

## 2014-06-25 DIAGNOSIS — M79672 Pain in left foot: Secondary | ICD-10-CM

## 2014-06-25 DIAGNOSIS — Z6841 Body Mass Index (BMI) 40.0 and over, adult: Secondary | ICD-10-CM | POA: Insufficient documentation

## 2014-06-25 LAB — CBC WITH DIFFERENTIAL/PLATELET
Basophils Absolute: 0 10*3/uL (ref 0.0–0.1)
Basophils Relative: 0.6 % (ref 0.0–3.0)
EOS PCT: 1.2 % (ref 0.0–5.0)
Eosinophils Absolute: 0.1 10*3/uL (ref 0.0–0.7)
HCT: 40.4 % (ref 36.0–46.0)
HEMOGLOBIN: 13.2 g/dL (ref 12.0–15.0)
Lymphocytes Relative: 23 % (ref 12.0–46.0)
Lymphs Abs: 2 10*3/uL (ref 0.7–4.0)
MCHC: 32.6 g/dL (ref 30.0–36.0)
MCV: 88.5 fl (ref 78.0–100.0)
MONOS PCT: 6 % (ref 3.0–12.0)
Monocytes Absolute: 0.5 10*3/uL (ref 0.1–1.0)
NEUTROS ABS: 6.1 10*3/uL (ref 1.4–7.7)
Neutrophils Relative %: 69.2 % (ref 43.0–77.0)
Platelets: 243 10*3/uL (ref 150.0–400.0)
RBC: 4.57 Mil/uL (ref 3.87–5.11)
RDW: 13.4 % (ref 11.5–15.5)
WBC: 8.8 10*3/uL (ref 4.0–10.5)

## 2014-06-25 LAB — HEMOGLOBIN A1C: Hgb A1c MFr Bld: 6.9 % — ABNORMAL HIGH (ref 4.6–6.5)

## 2014-06-25 LAB — BASIC METABOLIC PANEL
BUN: 26 mg/dL — ABNORMAL HIGH (ref 6–23)
CALCIUM: 9.9 mg/dL (ref 8.4–10.5)
CO2: 29 mEq/L (ref 19–32)
Chloride: 102 mEq/L (ref 96–112)
Creatinine, Ser: 0.9 mg/dL (ref 0.4–1.2)
GFR: 68.71 mL/min (ref >60.00)
Glucose, Bld: 117 mg/dL — ABNORMAL HIGH (ref 70–99)
Potassium: 4.4 mEq/L (ref 3.5–5.1)
SODIUM: 137 meq/L (ref 135–145)

## 2014-06-25 LAB — C-REACTIVE PROTEIN

## 2014-06-25 LAB — HEPATIC FUNCTION PANEL
ALBUMIN: 3.7 g/dL (ref 3.5–5.2)
ALT: 45 U/L — AB (ref 0–35)
AST: 37 U/L (ref 0–37)
Alkaline Phosphatase: 50 U/L (ref 39–117)
Bilirubin, Direct: 0 mg/dL (ref 0.0–0.3)
Total Bilirubin: 0.5 mg/dL (ref 0.2–1.2)
Total Protein: 7.7 g/dL (ref 6.0–8.3)

## 2014-06-25 LAB — LIPID PANEL
Cholesterol: 197 mg/dL (ref 0–200)
HDL: 49.5 mg/dL (ref >39.00)
NONHDL: 147.5
TRIGLYCERIDES: 228 mg/dL — AB (ref 0.0–149.0)
Total CHOL/HDL Ratio: 4
VLDL: 45.6 mg/dL — ABNORMAL HIGH (ref 0.0–40.0)

## 2014-06-25 LAB — TSH: TSH: 0.8 u[IU]/mL (ref 0.35–4.50)

## 2014-06-25 LAB — SEDIMENTATION RATE: SED RATE: 19 mm/h (ref 0–22)

## 2014-06-25 MED ORDER — LEVALBUTEROL TARTRATE 45 MCG/ACT IN AERO: 2.0000 | INHALATION_SPRAY | Freq: Four times a day (QID) | RESPIRATORY_TRACT | Status: DC | PRN

## 2014-06-25 NOTE — Assessment & Plan Note (Signed)
Saw Dr Orland Jarredroxler.  Diagnosed with arthritis.  Recommended different shoes.  Follow.

## 2014-06-25 NOTE — Assessment & Plan Note (Signed)
She has joined [employer].  Has adjusted her diet.  Has lost some weight.  Plans to start exercising more.  Follow.

## 2014-06-25 NOTE — Assessment & Plan Note (Signed)
Sugars as outlined.  Brought in no recorded sugar readings.  Continue current med regimen.  Follow.  Instructed on the importance of diet and exercise.  Follow metabolic panel and a1c.

## 2014-06-25 NOTE — Assessment & Plan Note (Signed)
Seeing Dr Lady GaryFath.  Currently doing well.  Follow.  On aspirin and flecainide.  Rated controlled.

## 2014-06-25 NOTE — Progress Notes (Signed)
Subjective:    Patient ID: Christine Robertson, female    DOB: 04-Nov-1955, 58 y.o.   MRN: [id number]  HPI 58 year old female with past history of diabetes, hypertension and GERD who comes in today for a scheduled follow up.  Brought in no sugar readings.  States her sugars in the am are averaging 139-160 and pm sugars 110-130.  Have discussed  the importance of diet and exercise.  She is walking some now.  Also has started [employer].  Has lost about 10 pounds since starting.  No nausea or vomiting.  On metformin, amaryl and Januvia.  Taking regularly.  Denies any chest pain or tightness.  No increased heart rate or palpitations.  Diagnosed with afib.  On metoprolol and Flecainide.  Following with cardiology.  Taking aspirin.  Breathing stable.  No increased sob.  She saw Dr Elvina Mattes yesterday.  Has arthritis in her feet.  He recommended specific shoes.  Feet limit her walking.  She does have a bike.  She also reports that over the last several days, her hand will "go cold".  Just her right hand.  Noticeable palpable difference.  Had someone else check her hand as well. Is intermittent.  Episodes may last 30 minutes to one hour.  No color change.  No pain or joint stiffness.  Clear distinction from the right to left hand.  No swelling of hand or arm.     Past Medical History  Diagnosis Date  . Arthritis   . Chicken pox   . Diabetes mellitus   . GERD (gastroesophageal reflux disease)   . Allergy     hay fever  . Hypertension   . Urinary incontinence     Current Outpatient Prescriptions on File Prior to Visit  Medication Sig Dispense Refill  . albuterol (PROVENTIL HFA;VENTOLIN HFA) 108 (90 BASE) MCG/ACT inhaler Inhale 2 puffs into the lungs every 6 (six) hours as needed for wheezing.  1 Inhaler  0  . aspirin 81 MG tablet Take 81 mg by mouth daily.      . cetirizine (ZYRTEC) 10 MG tablet Take 10 mg by mouth daily.      . clotrimazole-betamethasone (LOTRISONE) cream Apply 1 application topically 2  (two) times daily.  30 g  0  . flecainide (TAMBOCOR) 100 MG tablet Take 100 mg by mouth 2 (two) times daily.      . fluticasone (FLONASE) 50 MCG/ACT nasal spray Place 2 sprays into the nose daily as needed.      . fluticasone (FLOVENT HFA) 110 MCG/ACT inhaler Inhale 1 puff into the lungs 2 (two) times daily as needed.      Marland Kitchen glimepiride (AMARYL) 2 MG tablet TAKE 1/2 TABLET BY MOUTH EVERY DAY  30 tablet  5  . hydrochlorothiazide (HYDRODIURIL) 25 MG tablet TAKE 1 TABLET BY MOUTH EVERY DAY.  90 tablet  1  . JANUVIA 100 MG tablet TAKE 1 TABLET BY MOUTH EVERY DAY  30 tablet  5  . lisinopril (PRINIVIL,ZESTRIL) 5 MG tablet Take 5 mg by mouth daily.      . metFORMIN (GLUCOPHAGE) 1000 MG tablet Take 1 tablet (1,000 mg total) by mouth 2 (two) times daily with a meal.  60 tablet  5  . metFORMIN (GLUCOPHAGE) 1000 MG tablet TAKE 1 TABLET BY MOUTH TWICE DAILY WITH MEALS  60 tablet  0  . metoprolol tartrate (LOPRESSOR) 25 MG tablet Take 25 mg by mouth 2 (two) times daily.      . Multiple Vitamin (  MULTIVITAMIN) tablet Take 1 tablet by mouth daily.      Marland Kitchen omeprazole (PRILOSEC) 20 MG capsule TAKE 1 CAPSULE BY MOUTH TWICE DAILY.  60 capsule  5  . oxybutynin (DITROPAN) 5 MG tablet TAKE 1 TABLET BY MOUTH TWICE DAILY  180 tablet  0   No current facility-administered medications on file prior to visit.    Review of Systems Patient denies any headache, lightheadedness or dizziness.  No sinus or allergy symptoms.  No chest pain or palpitations.  Breathing stable.   No acid reflux, dysphagia or odynophagia.  No nausea or vomiting.  No significant abdominal pain or cramping.  No bowel change, such as diarrhea, constipation, BRBPR or melana.  No urine change.   Sugars as outlined.   Is followed at [facility].  Has started [employer].  Is walking some.  Describes her hand going cold as outlined.        Objective:   Physical Exam  Filed Vitals:   06/24/14 1406  BP: 110/70  Pulse: 70  Temp: 98 F (36.7  C)   Blood pressure:  128/82 and relatively equal in both arms.    58 year old female in no acute distress.   HEENT:  Nares- clear.  Oropharynx - without lesions. NECK:  Supple.  Nontender.  No audible bruit.  HEART:  Rate controlled.  LUNGS:  No crackles or wheezing audible.  Respirations even and unlabored.  RADIAL PULSE:  Equal bilaterally.  ABDOMEN:  Soft, nontender.  Bowel sounds present and normal.  No audible abdominal bruit.   EXTREMITIES:  No increased edema present.  DP pulses palpable and equal bilaterally.      FEET:  No lesions.        Assessment & Plan:  HEALTH MAINTENANCE.  Physical 01/21/14.  Is s/p hysterectomy.  Colonoscopy 02/20/10 - internal hemorrhoids otherwise normal.  Recommended follow up colonoscopy in five years.    Problem List Items Addressed This Visit   Atrial fibrillation     Seeing Dr Ubaldo Glassing.  Currently doing well.  Follow.  On aspirin and flecainide.  Rated controlled.       Cold hands     Over the last several days, right hand intermittently will turn cold.  Radial pulse equal bilaterally.  Pressures relatively equal.  Hand warm today.  States will feel ice cold when occurs.  No color change. Will have vascular surgery evaluate.  Check ESR and rheum panel.      Relevant Orders      Ambulatory referral to Vascular Surgery      Sedimentation rate      C-reactive protein      Rheumatoid factor      ANA   Diabetes mellitus     Sugars as outlined.  Brought in no recorded sugar readings.  Continue current med regimen.  Follow.  Instructed on the importance of diet and exercise.  Follow metabolic panel and N4O.      Foot pain, bilateral     Saw Dr Elvina Mattes.  Diagnosed with arthritis.  Recommended different shoes.  Follow.      GERD (gastroesophageal reflux disease)     Controlled on prilosec.  Follow.       Hypercholesteremia     Low cholesterol diet and exercise.  Check lipid panel.       Hypertension - Primary     Blood pressure under good  control.  Same medication regimen.  Follow.  Follow metabolic panel.  Obstructive sleep apnea     Continue CPAP.      Severe obesity (BMI >= 40)     She has joined [employer].  Has adjusted her diet.  Has lost some weight.  Plans to start exercising more.  Follow.        I spent 25 minutes with the patient and more than 50% of the time was spent in consultation regarding the above.

## 2014-06-25 NOTE — Assessment & Plan Note (Signed)
Low cholesterol diet and exercise.  Check lipid panel.   

## 2014-06-25 NOTE — Assessment & Plan Note (Signed)
Blood pressure under good control.  Same medication regimen.  Follow.  Follow metabolic panel.     

## 2014-06-25 NOTE — Assessment & Plan Note (Signed)
Over the last several days, right hand intermittently will turn cold.  Radial pulse equal bilaterally.  Pressures relatively equal.  Hand warm today.  States will feel ice cold when occurs.  No color change. Will have vascular surgery evaluate.  Check ESR and rheum panel.

## 2014-06-25 NOTE — Assessment & Plan Note (Signed)
Continue CPAP.  

## 2014-06-25 NOTE — Assessment & Plan Note (Signed)
Controlled on prilosec.  Follow.  

## 2014-06-26 LAB — RHEUMATOID FACTOR

## 2014-06-28 LAB — LDL CHOLESTEROL, DIRECT: Direct LDL: 123.3 mg/dL

## 2014-06-28 LAB — ANA: ANA: NEGATIVE

## 2014-07-05 ENCOUNTER — Other Ambulatory Visit: Payer: Self-pay | Admitting: Internal Medicine

## 2014-07-12 ENCOUNTER — Other Ambulatory Visit: Payer: Self-pay | Admitting: Internal Medicine

## 2014-07-13 DIAGNOSIS — E119 Type 2 diabetes mellitus without complications: Secondary | ICD-10-CM | POA: Insufficient documentation

## 2014-08-16 ENCOUNTER — Other Ambulatory Visit: Payer: Self-pay | Admitting: Internal Medicine

## 2014-09-10 ENCOUNTER — Other Ambulatory Visit: Payer: Self-pay | Admitting: Internal Medicine

## 2014-09-22 ENCOUNTER — Other Ambulatory Visit: Payer: Self-pay | Admitting: Internal Medicine

## 2014-09-24 ENCOUNTER — Ambulatory Visit: Payer: PRIVATE HEALTH INSURANCE | Admitting: Internal Medicine

## 2014-10-11 ENCOUNTER — Other Ambulatory Visit: Payer: Self-pay | Admitting: Internal Medicine

## 2014-11-11 ENCOUNTER — Other Ambulatory Visit: Payer: Self-pay | Admitting: *Deleted

## 2014-11-11 MED ORDER — GLIMEPIRIDE 2 MG PO TABS: 1.0000 mg | ORAL_TABLET | Freq: Every day | ORAL | Status: DC

## 2014-12-09 ENCOUNTER — Ambulatory Visit (INDEPENDENT_AMBULATORY_CARE_PROVIDER_SITE_OTHER): Payer: PRIVATE HEALTH INSURANCE | Admitting: Internal Medicine

## 2014-12-09 ENCOUNTER — Encounter: Payer: Self-pay | Admitting: Internal Medicine

## 2014-12-09 VITALS — BP 122/78 | HR 73 | Temp 98.4°F | Ht 63.0 in | Wt 303.5 lb

## 2014-12-09 DIAGNOSIS — I10 Essential (primary) hypertension: Secondary | ICD-10-CM | POA: Diagnosis not present

## 2014-12-09 DIAGNOSIS — R209 Unspecified disturbances of skin sensation: Secondary | ICD-10-CM

## 2014-12-09 DIAGNOSIS — E7800 Pure hypercholesterolemia, unspecified: Secondary | ICD-10-CM

## 2014-12-09 DIAGNOSIS — K219 Gastro-esophageal reflux disease without esophagitis: Secondary | ICD-10-CM | POA: Diagnosis not present

## 2014-12-09 DIAGNOSIS — M79672 Pain in left foot: Secondary | ICD-10-CM

## 2014-12-09 DIAGNOSIS — I4891 Unspecified atrial fibrillation: Secondary | ICD-10-CM

## 2014-12-09 DIAGNOSIS — M79671 Pain in right foot: Secondary | ICD-10-CM

## 2014-12-09 DIAGNOSIS — G4733 Obstructive sleep apnea (adult) (pediatric): Secondary | ICD-10-CM | POA: Diagnosis not present

## 2014-12-09 DIAGNOSIS — Q891 Congenital malformations of adrenal gland: Secondary | ICD-10-CM

## 2014-12-09 DIAGNOSIS — E118 Type 2 diabetes mellitus with unspecified complications: Secondary | ICD-10-CM

## 2014-12-09 DIAGNOSIS — R208 Other disturbances of skin sensation: Secondary | ICD-10-CM

## 2014-12-09 NOTE — Progress Notes (Signed)
Pre visit review using our clinic review tool, if applicable. No additional management support is needed unless otherwise documented below in the visit note. 

## 2014-12-09 NOTE — Progress Notes (Signed)
Patient ID: Christine Robertson, female   DOB: 09-17-1955, 59 y.o.   MRN: [id number]   Subjective:    Patient ID: Christine Robertson, female    DOB: [birth year], 59 y.o.   MRN: [id number]  HPI  Patient here for a scheduled follow up.  Reports sugars in am 150s.  Pm sugars 148-150.  Not watching her diet.  Plans to restart.  Plans to restart exercising.  Some back pain intermittently.  Some bilateral foot pain.  Seeing Dr Elvina Mattes.  No chest pain or tightness.  Breathing stable.  Bowels stable.     Past Medical History  Diagnosis Date  . Arthritis   . Chicken pox   . Diabetes mellitus   . GERD (gastroesophageal reflux disease)   . Allergy     hay fever  . Hypertension   . Urinary incontinence     Current Outpatient Prescriptions on File Prior to Visit  Medication Sig Dispense Refill  . albuterol (PROVENTIL HFA;VENTOLIN HFA) 108 (90 BASE) MCG/ACT inhaler Inhale 2 puffs into the lungs every 6 (six) hours as needed for wheezing. 1 Inhaler 0  . aspirin 81 MG tablet Take 81 mg by mouth daily.    . cetirizine (ZYRTEC) 10 MG tablet Take 10 mg by mouth daily.    . clotrimazole-betamethasone (LOTRISONE) cream Apply 1 application topically 2 (two) times daily. 30 g 0  . flecainide (TAMBOCOR) 100 MG tablet Take 100 mg by mouth 2 (two) times daily.    . fluticasone (FLOVENT HFA) 110 MCG/ACT inhaler Inhale 1 puff into the lungs 2 (two) times daily as needed.    Marland Kitchen glimepiride (AMARYL) 2 MG tablet Take 0.5 tablets (1 mg total) by mouth daily. 30 tablet 5  . hydrochlorothiazide (HYDRODIURIL) 25 MG tablet TAKE ONE TABLET BY MOUTH EVERY DAY 90 tablet 1  . JANUVIA 100 MG tablet TAKE 1 TABLET BY MOUTH EVERY DAY 30 tablet 5  . levalbuterol (XOPENEX HFA) 45 MCG/ACT inhaler Inhale 2 puffs into the lungs every 6 (six) hours as needed for wheezing. 1 Inhaler 1  . lisinopril (PRINIVIL,ZESTRIL) 5 MG tablet Take 5 mg by mouth daily.    . metFORMIN (GLUCOPHAGE) 1000 MG tablet TAKE 1 TABLET BY MOUTH TWICE DAILY WITH MEALS 60 tablet  5  . metoprolol tartrate (LOPRESSOR) 25 MG tablet Take 25 mg by mouth 2 (two) times daily.    . Multiple Vitamin (MULTIVITAMIN) tablet Take 1 tablet by mouth daily.    Marland Kitchen omeprazole (PRILOSEC) 20 MG capsule TAKE 1 CAPSULE BY MOUTH TWICE DAILY. 60 capsule 5  . oxybutynin (DITROPAN) 5 MG tablet TAKE 1 TABLET BY MOUTH TWICE DAILY 180 tablet 1   No current facility-administered medications on file prior to visit.    Review of Systems  Constitutional: Negative for appetite change and unexpected weight change.  HENT: Negative for congestion and sinus pressure.   Eyes: Negative for visual disturbance.  Respiratory: Negative for cough, chest tightness and shortness of breath.   Cardiovascular: Negative for chest pain, palpitations and leg swelling.  Gastrointestinal: Negative for nausea, vomiting, abdominal pain and diarrhea.  Genitourinary: Negative for dysuria and difficulty urinating.  Musculoskeletal: Positive for back pain (flares intermittently. ).       Bilateral foot pain.   Skin: Negative for color change and rash.  Neurological: Negative for dizziness, light-headedness and headaches.  Psychiatric/Behavioral: Negative for dysphoric mood and agitation.       Objective:     Blood pressure recheck:  116/68  Physical Exam  Constitutional:  She appears well-developed and well-nourished. No distress.  HENT:  Nose: Nose normal.  Mouth/Throat: Oropharynx is clear and moist.  Neck: Neck supple. No thyromegaly present.  Cardiovascular: Normal rate and regular rhythm.   Pulmonary/Chest: Breath sounds normal. No respiratory distress. She has no wheezes.  Abdominal: Soft. Bowel sounds are normal. There is no tenderness.  Musculoskeletal: She exhibits no edema or tenderness.  Lymphadenopathy:    She has no cervical adenopathy.  Skin: No rash noted. No erythema.  Psychiatric: She has a normal mood and affect. Her behavior is normal.    BP 122/78 mmHg  Pulse 73  Temp(Src) 98.4 F (36.9  C) (Oral)  Ht $R'5\' 3"'mg$  (1.6 m)  Wt 303 lb 8 oz (137.667 kg)  BMI 53.78 kg/m2  SpO2 92% Wt Readings from Last 3 Encounters:  12/09/14 303 lb 8 oz (137.667 kg)  06/24/14 297 lb 12 oz (135.059 kg)  01/21/14 294 lb 8 oz (133.584 kg)     Lab Results  Component Value Date   WBC 8.8 06/25/2014   HGB 13.2 06/25/2014   HCT 40.4 06/25/2014   PLT 243.0 06/25/2014   GLUCOSE 117* 06/25/2014   CHOL 197 06/25/2014   TRIG 228.0* 06/25/2014   HDL 49.50 06/25/2014   LDLDIRECT 123.3 06/25/2014   LDLCALC 100* 01/15/2014   ALT 45* 06/25/2014   AST 37 06/25/2014   NA 137 06/25/2014   K 4.4 06/25/2014   CL 102 06/25/2014   CREATININE 0.9 06/25/2014   BUN 26* 06/25/2014   CO2 29 06/25/2014   TSH 0.80 06/25/2014   INR 0.99 11/15/2012   HGBA1C 6.9* 06/25/2014   MICROALBUR 0.6 01/15/2014       Assessment & Plan:   Problem List Items Addressed This Visit    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  See last note for details.  No further w/up warranted.       Atrial fibrillation - Primary    Followed by Dr Ubaldo Glassing.  Stable on current regimen.  Follow.        Cold hands    No reoccurrence.  Did not keep the appt with vascular surgery.  Declines further evaluation at this time since better.       Diabetes mellitus    Sugars as outlined.  Low carb diet and exercise.  Follow met b and a1c.        Relevant Orders   Hemoglobin A1c   Foot pain, bilateral    Seeing Dr Elvina Mattes.        GERD (gastroesophageal reflux disease)    Controlled on omeprazole.        Hypercholesteremia    Low cholesterol diet and exercise.  Follow lipid panel.        Relevant Orders   Lipid panel   Hepatic function panel   Hypertension    Blood pressure doing well.  Follow pressures.  Follow metabolic panel.       Relevant Orders   Basic metabolic panel   Obstructive sleep apnea    CPAP.       Severe obesity (BMI >= 40)    Diet and exercise.  Follow.          I spent 25 minutes with the  patient and more than 50% of the time was spent in consultation regarding the above.     Einar Pheasant, MD

## 2014-12-11 ENCOUNTER — Other Ambulatory Visit: Payer: Self-pay | Admitting: Internal Medicine

## 2014-12-13 ENCOUNTER — Encounter: Payer: Self-pay | Admitting: Internal Medicine

## 2014-12-13 NOTE — Assessment & Plan Note (Signed)
Followed by Dr Lady GaryFath.  Stable on current regimen.  Follow.

## 2014-12-13 NOTE — Assessment & Plan Note (Signed)
Sugars as outlined.  Low carb diet and exercise.  Follow met b and a1c.

## 2014-12-13 NOTE — Assessment & Plan Note (Signed)
Last MRI -stable.  See last note for details.  No further w/up warranted.

## 2014-12-13 NOTE — Assessment & Plan Note (Signed)
No reoccurrence.  Did not keep the appt with vascular surgery.  Declines further evaluation at this time since better.

## 2014-12-13 NOTE — Assessment & Plan Note (Signed)
Low cholesterol diet and exercise.  Follow lipid panel.   

## 2014-12-13 NOTE — Assessment & Plan Note (Signed)
Controlled on omeprazole.   

## 2014-12-13 NOTE — Assessment & Plan Note (Signed)
Seeing Dr Orland Jarredroxler.

## 2014-12-13 NOTE — Assessment & Plan Note (Signed)
CPAP.  

## 2014-12-13 NOTE — Assessment & Plan Note (Signed)
Diet and exercise.  Follow.  

## 2014-12-13 NOTE — Assessment & Plan Note (Signed)
Blood pressure doing well.  Follow pressures.  Follow metabolic panel.  

## 2014-12-16 ENCOUNTER — Other Ambulatory Visit (INDEPENDENT_AMBULATORY_CARE_PROVIDER_SITE_OTHER): Payer: PRIVATE HEALTH INSURANCE

## 2014-12-16 DIAGNOSIS — E7800 Pure hypercholesterolemia, unspecified: Secondary | ICD-10-CM

## 2014-12-16 DIAGNOSIS — E118 Type 2 diabetes mellitus with unspecified complications: Secondary | ICD-10-CM | POA: Diagnosis not present

## 2014-12-16 DIAGNOSIS — I10 Essential (primary) hypertension: Secondary | ICD-10-CM

## 2014-12-16 LAB — BASIC METABOLIC PANEL
BUN: 24 mg/dL — AB (ref 6–23)
CALCIUM: 10 mg/dL (ref 8.4–10.5)
CO2: 32 meq/L (ref 19–32)
CREATININE: 0.8 mg/dL (ref 0.40–1.20)
Chloride: 100 mEq/L (ref 96–112)
GFR: 77.59 mL/min (ref >60.00)
GLUCOSE: 119 mg/dL — AB (ref 70–99)
Potassium: 4.4 mEq/L (ref 3.5–5.1)
Sodium: 139 mEq/L (ref 135–145)

## 2014-12-16 LAB — HEPATIC FUNCTION PANEL
ALBUMIN: 4.1 g/dL (ref 3.5–5.2)
ALT: 41 U/L — AB (ref 0–35)
AST: 27 U/L (ref 0–37)
Alkaline Phosphatase: 54 U/L (ref 39–117)
Bilirubin, Direct: 0.1 mg/dL (ref 0.0–0.3)
TOTAL PROTEIN: 6.8 g/dL (ref 6.0–8.3)
Total Bilirubin: 0.4 mg/dL (ref 0.2–1.2)

## 2014-12-16 LAB — LIPID PANEL
CHOL/HDL RATIO: 3
Cholesterol: 182 mg/dL (ref 0–200)
HDL: 52.7 mg/dL (ref >39.00)
LDL Cholesterol: 90 mg/dL (ref 0–99)
NONHDL: 129.3
TRIGLYCERIDES: 199 mg/dL — AB (ref 0.0–149.0)
VLDL: 39.8 mg/dL (ref 0.0–40.0)

## 2014-12-16 LAB — HEMOGLOBIN A1C: HEMOGLOBIN A1C: 7.1 % — AB (ref 4.6–6.5)

## 2014-12-18 ENCOUNTER — Encounter: Payer: Self-pay | Admitting: Internal Medicine

## 2014-12-18 ENCOUNTER — Other Ambulatory Visit: Payer: Self-pay | Admitting: Internal Medicine

## 2014-12-21 NOTE — Telephone Encounter (Signed)
Unread mychart message mailed to patient 

## 2015-02-17 ENCOUNTER — Encounter: Payer: Self-pay | Admitting: Internal Medicine

## 2015-03-11 ENCOUNTER — Ambulatory Visit (INDEPENDENT_AMBULATORY_CARE_PROVIDER_SITE_OTHER): Payer: No Typology Code available for payment source | Admitting: Internal Medicine

## 2015-03-11 ENCOUNTER — Encounter: Payer: Self-pay | Admitting: Internal Medicine

## 2015-03-11 VITALS — BP 107/69 | HR 70 | Temp 98.3°F | Resp 18 | Ht 63.0 in | Wt 308.0 lb

## 2015-03-11 DIAGNOSIS — Z0000 Encounter for general adult medical examination without abnormal findings: Secondary | ICD-10-CM | POA: Diagnosis not present

## 2015-03-11 DIAGNOSIS — M79672 Pain in left foot: Secondary | ICD-10-CM

## 2015-03-11 DIAGNOSIS — R809 Proteinuria, unspecified: Secondary | ICD-10-CM

## 2015-03-11 DIAGNOSIS — E7800 Pure hypercholesterolemia, unspecified: Secondary | ICD-10-CM

## 2015-03-11 DIAGNOSIS — K219 Gastro-esophageal reflux disease without esophagitis: Secondary | ICD-10-CM

## 2015-03-11 DIAGNOSIS — M79671 Pain in right foot: Secondary | ICD-10-CM

## 2015-03-11 DIAGNOSIS — I10 Essential (primary) hypertension: Secondary | ICD-10-CM

## 2015-03-11 DIAGNOSIS — G4733 Obstructive sleep apnea (adult) (pediatric): Secondary | ICD-10-CM

## 2015-03-11 DIAGNOSIS — Z1239 Encounter for other screening for malignant neoplasm of breast: Secondary | ICD-10-CM

## 2015-03-11 DIAGNOSIS — I4891 Unspecified atrial fibrillation: Secondary | ICD-10-CM

## 2015-03-11 DIAGNOSIS — E118 Type 2 diabetes mellitus with unspecified complications: Secondary | ICD-10-CM

## 2015-03-11 NOTE — Progress Notes (Signed)
Pre visit review using our clinic review tool, if applicable. No additional management support is needed unless otherwise documented below in the visit note. 

## 2015-03-11 NOTE — Progress Notes (Signed)
Patient ID: Christine Robertson, female   DOB: 1956/04/26, 59 y.o.   MRN: [id number]   Subjective:    Patient ID: Christine Robertson, female    DOB: [birth year], 59 y.o.   MRN: [id number]  HPI  Patient here to follow up on her current medical issues as well as for a complete physical exam.  Her main complaint is that of bilateral foot pain.  This has worsened over the last month.  Limiting her activity.  She is scheduled to see Dr Elvina Mattes Monday 03/14/15.  Has not been exercising.  We discussed diet and exercise.  Weight is increasing.  Did not bring in any sugar readings.  States sugars are averaging 147-154.  Breathing stable.  No sob.  No chest pain or tightness.  Bowels stable.     Past Medical History  Diagnosis Date  . Arthritis   . Chicken pox   . Diabetes mellitus   . GERD (gastroesophageal reflux disease)   . Allergy     hay fever  . Hypertension   . Urinary incontinence     Outpatient Encounter Prescriptions as of 03/11/2015  Medication Sig  . aspirin 81 MG tablet Take 81 mg by mouth daily.  . cetirizine (ZYRTEC) 10 MG tablet Take 10 mg by mouth daily.  . clotrimazole-betamethasone (LOTRISONE) cream Apply 1 application topically 2 (two) times daily.  . flecainide (TAMBOCOR) 100 MG tablet Take 100 mg by mouth 2 (two) times daily.  Marland Kitchen glimepiride (AMARYL) 2 MG tablet Take 0.5 tablets (1 mg total) by mouth daily.  . hydrochlorothiazide (HYDRODIURIL) 25 MG tablet TAKE ONE TABLET BY MOUTH EVERY DAY  . ibuprofen (ADVIL,MOTRIN) 200 MG tablet Take 600 mg by mouth every 4 (four) hours as needed.  Marland Kitchen JANUVIA 100 MG tablet TAKE 1 TABLET BY MOUTH EVERY DAY  . lisinopril (PRINIVIL,ZESTRIL) 5 MG tablet Take 5 mg by mouth daily.  . metFORMIN (GLUCOPHAGE) 1000 MG tablet TAKE 1 TABLET BY MOUTH TWICE DAILY WITH MEALS  . metoprolol tartrate (LOPRESSOR) 25 MG tablet Take 25 mg by mouth 2 (two) times daily.  . Multiple Vitamin (MULTIVITAMIN) tablet Take 1 tablet by mouth daily.  Marland Kitchen oxybutynin (DITROPAN) 5 MG tablet  TAKE 1 TABLET BY MOUTH TWICE DAILY  . [DISCONTINUED] omeprazole (PRILOSEC) 20 MG capsule TAKE 1 CAPSULE BY MOUTH TWICE DAILY.  Marland Kitchen albuterol (PROVENTIL HFA;VENTOLIN HFA) 108 (90 BASE) MCG/ACT inhaler Inhale 2 puffs into the lungs every 6 (six) hours as needed for wheezing. (Patient not taking: Reported on 03/11/2015)  . fluticasone (FLOVENT HFA) 110 MCG/ACT inhaler Inhale 1 puff into the lungs 2 (two) times daily as needed.  . levalbuterol (XOPENEX HFA) 45 MCG/ACT inhaler Inhale 2 puffs into the lungs every 6 (six) hours as needed for wheezing. (Patient not taking: Reported on 03/11/2015)   No facility-administered encounter medications on file as of 03/11/2015.    Review of Systems  Constitutional: Negative for fever and appetite change.  HENT: Negative for congestion and sinus pressure.   Eyes: Negative for pain and visual disturbance.  Respiratory: Negative for cough, chest tightness and shortness of breath.   Cardiovascular: Negative for chest pain and palpitations.  Gastrointestinal: Negative for nausea, vomiting, abdominal pain and diarrhea.  Genitourinary: Negative for dysuria and difficulty urinating.  Musculoskeletal:       Bilateral foot pain.    Skin: Negative for color change and rash.  Neurological: Negative for dizziness, light-headedness and headaches.  Hematological: Negative for adenopathy. Does not bruise/bleed easily.  Psychiatric/Behavioral: Negative for dysphoric  mood and agitation.       Objective:     Blood pressure recheck:  118-120/78  Physical Exam  Constitutional: She is oriented to person, place, and time. She appears well-developed and well-nourished.  HENT:  Nose: Nose normal.  Mouth/Throat: Oropharynx is clear and moist.  Eyes: Right eye exhibits no discharge. Left eye exhibits no discharge. No scleral icterus.  Neck: Neck supple. No thyromegaly present.  Cardiovascular: Normal rate and regular rhythm.   Pulmonary/Chest: Breath sounds normal. No accessory  muscle usage. No tachypnea. No respiratory distress. She has no decreased breath sounds. She has no wheezes. She has no rhonchi. Right breast exhibits no inverted nipple, no mass, no nipple discharge and no tenderness (no axillary adenopathy). Left breast exhibits no inverted nipple, no mass, no nipple discharge and no tenderness (no axilarry adenopathy).  Abdominal: Soft. Bowel sounds are normal. There is no tenderness.  Musculoskeletal: She exhibits no edema or tenderness.  Lymphadenopathy:    She has no cervical adenopathy.  Neurological: She is alert and oriented to person, place, and time.  Skin: Skin is warm. No rash noted.  Psychiatric: She has a normal mood and affect. Her behavior is normal.    BP 107/69 mmHg  Pulse 70  Temp(Src) 98.3 F (36.8 C) (Oral)  Resp 18  Ht _0  (1.6 m)  Wt 308 lb (139.708 kg)  BMI 54.57 kg/m2  SpO2 95% Wt Readings from Last 3 Encounters:  03/11/15 308 lb (139.708 kg)  12/09/14 303 lb 8 oz (137.667 kg)  06/24/14 297 lb 12 oz (135.059 kg)     Lab Results  Component Value Date   WBC 8.8 06/25/2014   HGB 13.2 06/25/2014   HCT 40.4 06/25/2014   PLT 243.0 06/25/2014   GLUCOSE 119* 12/16/2014   CHOL 182 12/16/2014   TRIG 199.0* 12/16/2014   HDL 52.70 12/16/2014   LDLDIRECT 123.3 06/25/2014   LDLCALC 90 12/16/2014   ALT 41* 12/16/2014   AST 27 12/16/2014   NA 139 12/16/2014   K 4.4 12/16/2014   CL 100 12/16/2014   CREATININE 0.80 12/16/2014   BUN 24* 12/16/2014   CO2 32 12/16/2014   TSH 0.80 06/25/2014   INR 0.99 11/15/2012   HGBA1C 7.1* 12/16/2014   MICROALBUR 0.6 01/15/2014      Assessment & Plan:   Problem List Items Addressed This Visit    Atrial fibrillation    Followed by Dr Ubaldo Glassing.  No problems reported today.       Diabetes mellitus    Discussed diet and exercise.  Needs to check and record sugars.  Discussed eye exam.  Follow met b and a1c.        Relevant Orders   Hepatic function panel   Urinalysis, Routine w  reflex microscopic (not at [facility])   Hemoglobin J4N   Basic metabolic panel   Microalbumin / creatinine urine ratio   Foot pain, bilateral    Has increased.  Due to see Dr Elvina Mattes 03/14/15.       GERD (gastroesophageal reflux disease)    Controlled on omeprazole.       Health care maintenance    Physical today 03/14/15.  Scheduled mammogram.  Last mammogram 03/15/14 - [facility] I.  Colonoscopy 2013.        Hypercholesteremia    Low cholesterol diet and exercise.  Follow lipid panel.        Relevant Orders   Lipid panel   Hypertension    Blood pressure under  good control.  Continue same medication regimen.  Follow pressures.  Follow metabolic panel.        Microalbuminuria    On lisinopril.  Renal function has been normal.  Follow.       Obstructive sleep apnea    CPAP.       Severe obesity (BMI >= 40)    Discussed diet and exercise.         Other Visit Diagnoses    Routine general medical examination at a health care facility    -  Primary    Breast cancer screening        Relevant Orders    MM DIGITAL SCREENING BILATERAL        Einar Pheasant, MD

## 2015-03-14 ENCOUNTER — Encounter: Payer: Self-pay | Admitting: Internal Medicine

## 2015-03-14 DIAGNOSIS — Z0000 Encounter for general adult medical examination without abnormal findings: Secondary | ICD-10-CM | POA: Insufficient documentation

## 2015-03-14 NOTE — Assessment & Plan Note (Signed)
Discussed diet and exercise 

## 2015-03-14 NOTE — Assessment & Plan Note (Signed)
Physical today 03/14/15.  Scheduled mammogram.  Last mammogram 03/15/14 - Bir[facility] I.  Colonoscopy 2013.

## 2015-03-14 NOTE — Assessment & Plan Note (Signed)
Low cholesterol diet and exercise.  Follow lipid panel.   

## 2015-03-14 NOTE — Assessment & Plan Note (Signed)
Has increased.  Due to see Dr Orland Jarredroxler 03/14/15.

## 2015-03-14 NOTE — Assessment & Plan Note (Signed)
Discussed diet and exercise.  Needs to check and record sugars.  Discussed eye exam.  Follow met b and a1c.

## 2015-03-14 NOTE — Assessment & Plan Note (Signed)
Blood pressure under good control.  Continue same medication regimen.  Follow pressures.  Follow metabolic panel.   

## 2015-03-14 NOTE — Assessment & Plan Note (Signed)
Controlled on omeprazole.   

## 2015-03-14 NOTE — Assessment & Plan Note (Signed)
CPAP.  

## 2015-03-14 NOTE — Assessment & Plan Note (Addendum)
Followed by Dr Lady GaryFath.  No problems reported today.

## 2015-03-14 NOTE — Assessment & Plan Note (Signed)
On lisinopril.  Renal function has been normal.  Follow.

## 2015-03-15 ENCOUNTER — Other Ambulatory Visit: Payer: Self-pay | Admitting: Internal Medicine

## 2015-03-17 ENCOUNTER — Ambulatory Visit
Admission: RE | Admit: 2015-03-17 | Discharge: 2015-03-17 | Disposition: A | Discharge status: Home / Self Care | Payer: No Typology Code available for payment source | Source: Ambulatory Visit | Attending: Internal Medicine | Admitting: Internal Medicine

## 2015-03-17 DIAGNOSIS — Z1239 Encounter for other screening for malignant neoplasm of breast: Secondary | ICD-10-CM

## 2015-03-21 ENCOUNTER — Ambulatory Visit
Admission: RE | Admit: 2015-03-21 | Discharge: 2015-03-21 | Disposition: A | Discharge status: Home / Self Care | Payer: No Typology Code available for payment source | Source: Ambulatory Visit | Attending: Internal Medicine | Admitting: Internal Medicine

## 2015-03-21 ENCOUNTER — Other Ambulatory Visit: Payer: Self-pay | Admitting: Internal Medicine

## 2015-03-21 DIAGNOSIS — Z1231 Encounter for screening mammogram for malignant neoplasm of breast: Secondary | ICD-10-CM | POA: Insufficient documentation

## 2015-03-21 DIAGNOSIS — N63 Unspecified lump in unspecified breast: Secondary | ICD-10-CM

## 2015-03-21 DIAGNOSIS — R928 Other abnormal and inconclusive findings on diagnostic imaging of breast: Secondary | ICD-10-CM | POA: Insufficient documentation

## 2015-03-24 ENCOUNTER — Ambulatory Visit
Admission: RE | Admit: 2015-03-24 | Discharge: 2015-03-24 | Disposition: A | Discharge status: Home / Self Care | Payer: No Typology Code available for payment source | Source: Ambulatory Visit | Attending: Internal Medicine | Admitting: Internal Medicine

## 2015-03-24 ENCOUNTER — Other Ambulatory Visit: Payer: Self-pay | Admitting: Internal Medicine

## 2015-03-24 DIAGNOSIS — N63 Unspecified lump in unspecified breast: Secondary | ICD-10-CM

## 2015-03-24 DIAGNOSIS — R928 Other abnormal and inconclusive findings on diagnostic imaging of breast: Secondary | ICD-10-CM

## 2015-03-24 NOTE — Progress Notes (Signed)
Order placed for referral to Dr Byrnett.   

## 2015-03-25 ENCOUNTER — Encounter: Payer: Self-pay | Admitting: General Surgery

## 2015-03-28 ENCOUNTER — Ambulatory Visit: Payer: No Typology Code available for payment source

## 2015-03-28 ENCOUNTER — Encounter: Payer: Self-pay | Admitting: General Surgery

## 2015-03-28 ENCOUNTER — Ambulatory Visit (INDEPENDENT_AMBULATORY_CARE_PROVIDER_SITE_OTHER): Payer: No Typology Code available for payment source | Admitting: General Surgery

## 2015-03-28 VITALS — BP 136/72 | HR 74 | Resp 14 | Ht 64.0 in | Wt 323.0 lb

## 2015-03-28 DIAGNOSIS — R928 Other abnormal and inconclusive findings on diagnostic imaging of breast: Secondary | ICD-10-CM | POA: Diagnosis not present

## 2015-03-28 DIAGNOSIS — N63 Unspecified lump in breast: Secondary | ICD-10-CM | POA: Diagnosis not present

## 2015-03-28 DIAGNOSIS — N6320 Unspecified lump in the left breast, unspecified quadrant: Secondary | ICD-10-CM

## 2015-03-28 HISTORY — PX: BREAST BIOPSY: SHX20

## 2015-03-28 NOTE — Patient Instructions (Signed)

## 2015-03-28 NOTE — Progress Notes (Signed)
Patient ID: Christine Robertson, female   DOB: May 27, 1956, 59 y.o.   MRN: [id number]  Chief Complaint  Patient presents with  . Other    mammogram    HPI Christine Robertson is a 59 y.o. female who presents for a breast evaluation. The most recent mammogram was done on 03/21/15 and added views and left breast ultrasound on 03/24/15. Patient does perform regular self breast checks and gets regular mammograms done. She does have a family history of breast cancer. She denies any breast pain or tenderness. HPI  Past Medical History  Diagnosis Date  . Arthritis   . Chicken pox   . Diabetes mellitus   . GERD (gastroesophageal reflux disease)   . Allergy     hay fever  . Hypertension   . Urinary incontinence   . IBS (irritable bowel syndrome)   . Sleep apnea     Uses C-Pap machine    Past Surgical History  Procedure Laterality Date  . Abdominal hysterectomy  2010  . Tonsillectomy and adenoidectomy  1959  . Bladder surgery  1967    stretching of bladder    Family History  Problem Relation Age of Onset  . Cancer Mother     parents  . Diabetes Mother   . Diabetes Father   . Cancer Father   . Cancer Maternal Grandmother     breast  . Breast cancer Maternal Grandmother   . Breast cancer Maternal Aunt   . Birth defects Sister     lung    Social History History  Substance Use Topics  . Smoking status: Never Smoker   . Smokeless tobacco: Never Used  . Alcohol Use: No    Allergies  Allergen Reactions  . Augmentin [Amoxicillin-Pot Clavulanate] Hives  . Demerol [Meperidine] Nausea Only  . Erythromycin Hives    Current Outpatient Prescriptions  Medication Sig Dispense Refill  . albuterol (PROVENTIL HFA;VENTOLIN HFA) 108 (90 BASE) MCG/ACT inhaler Inhale 2 puffs into the lungs every 6 (six) hours as needed for wheezing. 1 Inhaler 0  . aspirin 81 MG tablet Take 81 mg by mouth daily.    . cetirizine (ZYRTEC) 10 MG tablet Take 10 mg by mouth daily.    . clotrimazole-betamethasone  (LOTRISONE) cream Apply 1 application topically 2 (two) times daily. 30 g 0  . flecainide (TAMBOCOR) 100 MG tablet Take 100 mg by mouth 2 (two) times daily.    . fluticasone (FLOVENT HFA) 110 MCG/ACT inhaler Inhale 1 puff into the lungs 2 (two) times daily as needed.    Marland Kitchen glimepiride (AMARYL) 2 MG tablet Take 0.5 tablets (1 mg total) by mouth daily. 30 tablet 5  . hydrochlorothiazide (HYDRODIURIL) 25 MG tablet TAKE 1 TABLET BY MOUTH EVERY DAY 90 tablet 0  . ibuprofen (ADVIL,MOTRIN) 200 MG tablet Take 600 mg by mouth every 4 (four) hours as needed.    Marland Kitchen JANUVIA 100 MG tablet TAKE 1 TABLET BY MOUTH EVERY DAY 30 tablet 5  . levalbuterol (XOPENEX HFA) 45 MCG/ACT inhaler Inhale 2 puffs into the lungs every 6 (six) hours as needed for wheezing. 1 Inhaler 1  . lisinopril (PRINIVIL,ZESTRIL) 5 MG tablet Take 5 mg by mouth daily.    . metFORMIN (GLUCOPHAGE) 1000 MG tablet TAKE 1 TABLET BY MOUTH TWICE DAILY WITH MEALS 60 tablet 5  . metoprolol tartrate (LOPRESSOR) 25 MG tablet Take 25 mg by mouth 2 (two) times daily.    . Multiple Vitamin (MULTIVITAMIN) tablet Take 1 tablet by mouth daily.    Marland Kitchen  oxybutynin (DITROPAN) 5 MG tablet TAKE 1 TABLET BY MOUTH TWICE DAILY 180 tablet 1   No current facility-administered medications for this visit.    Review of Systems Review of Systems  Constitutional: Negative.   Respiratory: Negative.   Cardiovascular: Negative.     Blood pressure 136/72, pulse 74, resp. rate 14, height  (1.626 m), weight 323 lb (146.512 kg).  Physical Exam Physical Exam  Constitutional: She is oriented to person, place, and time. She appears well-developed and well-nourished.  Eyes: Conjunctivae are normal. No scleral icterus.  Neck: Neck supple.  Cardiovascular: Normal rate, regular rhythm and normal heart sounds.   Pulmonary/Chest: Breath sounds normal. Right breast exhibits no inverted nipple, no mass, no nipple discharge, no skin change and no tenderness. Left breast exhibits  no inverted nipple, no mass, no nipple discharge, no skin change and no tenderness.  Lymphadenopathy:    She has no cervical adenopathy.    She has no axillary adenopathy.  Neurological: She is alert and oriented to person, place, and time.  Skin: Skin is warm and dry.  Psychiatric: She has a normal mood and affect.    Data Reviewed 2014-2016 mammograms reviewed. Gradual prominence of a soft tissue density in the left.   Ultrasound examination of the left breast in the 2-3:00 position, 2 cm from the nipple showed a 0.6 x 0.6 x 0.7 hypoechoic area with some posterior acoustic shadowing. This corresponded to the [facility] study. The patient was amenable to core biopsy. This was completed using 9 mL of 0.5% Xylocaine with 0.25% Marcaine with 1-200,000 units of epinephrine. The area was prepped with) prep and drape. A 14-gauge vacuum device was used to obtain a proximally 8 core samples from 2 locations to the lesion. Scant bleeding was noted. The skin defect was closed with benzoin and Steri-Strips followed by Telfa and Tegaderm dressing. Postbiopsy instructions provided.  Assessment    Abnormal left breast mammogram.    Plan    The patient will be contacted when pathology available. With a benign result, we'll arrange for a postbiopsy mammogram to confirm concordance between the area biopsied and the previous area of mammographic abnormality.     PCP:  Letta Median 03/29/2015, 4:45 PM

## 2015-03-29 ENCOUNTER — Telehealth: Payer: Self-pay | Admitting: *Deleted

## 2015-03-29 DIAGNOSIS — N6320 Unspecified lump in the left breast, unspecified quadrant: Secondary | ICD-10-CM | POA: Insufficient documentation

## 2015-03-29 DIAGNOSIS — R928 Other abnormal and inconclusive findings on diagnostic imaging of breast: Secondary | ICD-10-CM | POA: Insufficient documentation

## 2015-03-29 NOTE — Telephone Encounter (Signed)
Dr Luisa Hart called with recent breast biopsy report, benign fibrocystic changes.

## 2015-03-29 NOTE — Telephone Encounter (Signed)
We'll obtain post biopsy mammograms to confirm the area of concern has been adequately sampled.

## 2015-03-30 ENCOUNTER — Other Ambulatory Visit: Payer: Self-pay

## 2015-03-30 ENCOUNTER — Other Ambulatory Visit: Payer: Self-pay | Admitting: Internal Medicine

## 2015-03-30 DIAGNOSIS — N6320 Unspecified lump in the left breast, unspecified quadrant: Secondary | ICD-10-CM

## 2015-03-30 NOTE — Telephone Encounter (Signed)
May leave message on voice mail or she can get mammogram appointment time at her f/u here next week.

## 2015-03-30 NOTE — Telephone Encounter (Signed)
Notified patient as instructed, patient pleased. Discussed follow-up appointments, patient agrees  

## 2015-03-30 NOTE — Telephone Encounter (Signed)
Spoke with patient and she is scheduled for a Uni Left mammogram at [facility] on 03/31/15 at 2:40 pm. She is amendable to this.

## 2015-03-31 ENCOUNTER — Ambulatory Visit
Admission: RE | Admit: 2015-03-31 | Discharge: 2015-03-31 | Disposition: A | Discharge status: Home / Self Care | Payer: No Typology Code available for payment source | Source: Ambulatory Visit | Attending: General Surgery | Admitting: General Surgery

## 2015-03-31 ENCOUNTER — Ambulatory Visit: Payer: No Typology Code available for payment source

## 2015-03-31 DIAGNOSIS — N63 Unspecified lump in breast: Secondary | ICD-10-CM | POA: Diagnosis not present

## 2015-03-31 DIAGNOSIS — N6320 Unspecified lump in the left breast, unspecified quadrant: Secondary | ICD-10-CM

## 2015-04-04 ENCOUNTER — Ambulatory Visit (INDEPENDENT_AMBULATORY_CARE_PROVIDER_SITE_OTHER): Payer: No Typology Code available for payment source

## 2015-04-04 DIAGNOSIS — R928 Other abnormal and inconclusive findings on diagnostic imaging of breast: Secondary | ICD-10-CM

## 2015-04-04 NOTE — Progress Notes (Signed)
Patient here today for follow up post left breast biopsy.  Dressing removed, steristrip in place and aware it may come off in one week.  Minimal bruising noted.  The patient is aware that a heating pad may be used for comfort as needed.  Aware of pathology. Follow up as scheduled. 

## 2015-04-04 NOTE — Patient Instructions (Signed)
Patient to return as scheduled.  

## 2015-04-08 ENCOUNTER — Other Ambulatory Visit: Payer: No Typology Code available for payment source

## 2015-04-15 ENCOUNTER — Telehealth: Payer: Self-pay

## 2015-04-15 NOTE — Telephone Encounter (Signed)
PA started via covermymeds. Rx: Januvia 100 mg

## 2015-04-18 ENCOUNTER — Telehealth: Payer: Self-pay

## 2015-04-18 NOTE — Telephone Encounter (Signed)
Contact pt and make her aware of pharmacy request/insurance request.  Notify her that her insurance is not covering [place].  If agreeable, stop [place] and start tradjenta  q day.  Check and record sugars and send in readings over the next few weeks.

## 2015-04-18 NOTE — Telephone Encounter (Signed)
Pharmacy faxed request that Januvia  tablets are not covered with patients prescription plan.  Alernatives suggested are Nesina, Onglyza, or tradjenta.  Please advise?

## 2015-04-19 ENCOUNTER — Other Ambulatory Visit: Payer: Self-pay | Admitting: *Deleted

## 2015-04-19 ENCOUNTER — Other Ambulatory Visit (INDEPENDENT_AMBULATORY_CARE_PROVIDER_SITE_OTHER): Payer: 59

## 2015-04-19 DIAGNOSIS — E7800 Pure hypercholesterolemia, unspecified: Secondary | ICD-10-CM

## 2015-04-19 DIAGNOSIS — E118 Type 2 diabetes mellitus with unspecified complications: Secondary | ICD-10-CM | POA: Diagnosis not present

## 2015-04-19 LAB — BASIC METABOLIC PANEL
BUN: 24 mg/dL — ABNORMAL HIGH (ref 6–23)
CALCIUM: 9.7 mg/dL (ref 8.4–10.5)
CO2: 29 mEq/L (ref 19–32)
Chloride: 102 mEq/L (ref 96–112)
Creatinine, Ser: 0.7 mg/dL (ref 0.40–1.20)
GFR: 90.41 mL/min (ref >60.00)
Glucose, Bld: 122 mg/dL — ABNORMAL HIGH (ref 70–99)
Potassium: 4.3 mEq/L (ref 3.5–5.1)
SODIUM: 139 meq/L (ref 135–145)

## 2015-04-19 LAB — LDL CHOLESTEROL, DIRECT: Direct LDL: 101 mg/dL

## 2015-04-19 LAB — URINALYSIS, ROUTINE W REFLEX MICROSCOPIC
BILIRUBIN URINE: NEGATIVE
HGB URINE DIPSTICK: NEGATIVE
KETONES UR: NEGATIVE
Leukocytes, UA: NEGATIVE
NITRITE: NEGATIVE
Specific Gravity, Urine: 1.025 (ref 1.000–1.030)
Total Protein, Urine: NEGATIVE
Urine Glucose: NEGATIVE
Urobilinogen, UA: 0.2 (ref 0.0–1.0)
pH: 6 (ref 5.0–8.0)

## 2015-04-19 LAB — LIPID PANEL
Cholesterol: 170 mg/dL (ref 0–200)
HDL: 48.5 mg/dL (ref >39.00)
NONHDL: 121.48
TRIGLYCERIDES: 206 mg/dL — AB (ref 0.0–149.0)
Total CHOL/HDL Ratio: 4
VLDL: 41.2 mg/dL — ABNORMAL HIGH (ref 0.0–40.0)

## 2015-04-19 LAB — HEPATIC FUNCTION PANEL
ALT: 34 U/L (ref 0–35)
AST: 24 U/L (ref 0–37)
Albumin: 4 g/dL (ref 3.5–5.2)
Alkaline Phosphatase: 52 U/L (ref 39–117)
Bilirubin, Direct: 0.1 mg/dL (ref 0.0–0.3)
Total Bilirubin: 0.4 mg/dL (ref 0.2–1.2)
Total Protein: 6.9 g/dL (ref 6.0–8.3)

## 2015-04-19 LAB — MICROALBUMIN / CREATININE URINE RATIO
Creatinine,U: 114.5 mg/dL
Microalb Creat Ratio: 0.6 mg/g (ref 0.0–30.0)
Microalb, Ur: 0.7 mg/dL (ref 0.0–1.9)

## 2015-04-19 LAB — HEMOGLOBIN A1C: Hgb A1c MFr Bld: 6.9 % — ABNORMAL HIGH (ref 4.6–6.5)

## 2015-04-19 MED ORDER — LINAGLIPTIN 5 MG PO TABS
5.0000 mg | ORAL_TABLET | Freq: Every day | ORAL | Status: DC
Start: 2015-04-19 — End: 2015-05-20

## 2015-04-19 NOTE — Telephone Encounter (Signed)
Patient is agreeable to the Tradjenta.  Verbalized understanding to stop the Januvia.  She will continue to check blood sugars and record and submit to you to review.

## 2015-04-26 ENCOUNTER — Telehealth: Payer: Self-pay | Admitting: *Deleted

## 2015-04-26 ENCOUNTER — Other Ambulatory Visit: Payer: Self-pay | Admitting: Internal Medicine

## 2015-04-26 NOTE — Telephone Encounter (Signed)
Patient called back and said that she did have her postbiopsy mammogram done at [place] either 04/04/15 or 04/05/15, she couldn't remember the exact date.

## 2015-04-26 NOTE — Telephone Encounter (Signed)
Mammogram completed 03-31-15, I did not see a report yet.

## 2015-04-26 NOTE — Telephone Encounter (Signed)
-----   Message from Earline Mayotte, MD sent at 04/24/2015  9:26 AM EDT ----- Can you contact the patient and find out why she has not had the postbiopsy mammogram done to confirm appropriate tissue sampling as requested last month? Thank you  ----- Message -----    From: SYSTEM    Sent: 04/01/2015  12:05 AM      To: Earline Mayotte, MD

## 2015-05-02 ENCOUNTER — Encounter: Payer: Self-pay | Admitting: Internal Medicine

## 2015-05-09 ENCOUNTER — Other Ambulatory Visit: Payer: Self-pay | Admitting: Internal Medicine

## 2015-05-10 ENCOUNTER — Encounter: Payer: Self-pay | Admitting: General Surgery

## 2015-05-10 ENCOUNTER — Ambulatory Visit (INDEPENDENT_AMBULATORY_CARE_PROVIDER_SITE_OTHER): Payer: 59 | Admitting: General Surgery

## 2015-05-10 VITALS — BP 138/82 | HR 78 | Resp 14 | Ht 64.0 in | Wt 301.0 lb

## 2015-05-10 DIAGNOSIS — R928 Other abnormal and inconclusive findings on diagnostic imaging of breast: Secondary | ICD-10-CM

## 2015-05-10 NOTE — Progress Notes (Addendum)
Patient ID: Christine Robertson, female   DOB: 1956-06-06, 59 y.o.   MRN: [id number]  Chief Complaint  Patient presents with  . Follow-up    post biiopsy mammogram    HPI Christine Robertson is a 59 y.o. female.  Here today for her postop breast biopsy follow up mammogram. Left breast biopsy was 03-28-15. No new breast issues.   HPI  Past Medical History  Diagnosis Date  . Arthritis   . Chicken pox   . Diabetes mellitus   . GERD (gastroesophageal reflux disease)   . Allergy     hay fever  . Hypertension   . Urinary incontinence   . IBS (irritable bowel syndrome)   . Sleep apnea     Uses C-Pap machine  . Murmur     Past Surgical History  Procedure Laterality Date  . Abdominal hysterectomy  2010  . Tonsillectomy and adenoidectomy  1959  . Bladder surgery  1967    stretching of bladder  . Breast biopsy Left 03/28/2015    done at Dr. Rutherford Nail office    Family History  Problem Relation Age of Onset  . Diabetes Mother   . Cancer Father     lung  . Breast cancer Maternal Grandmother   . Breast cancer Maternal Aunt     Social History Social History  Substance Use Topics  . Smoking status: Never Smoker   . Smokeless tobacco: Never Used  . Alcohol Use: No    Allergies  Allergen Reactions  . Augmentin [Amoxicillin-Pot Clavulanate] Hives  . Demerol [Meperidine] Nausea Only  . Erythromycin Hives    Current Outpatient Prescriptions  Medication Sig Dispense Refill  . albuterol (PROVENTIL HFA;VENTOLIN HFA) 108 (90 BASE) MCG/ACT inhaler Inhale 2 puffs into the lungs every 6 (six) hours as needed for wheezing. 1 Inhaler 0  . aspirin 81 MG tablet Take 81 mg by mouth daily.    . cetirizine (ZYRTEC) 10 MG tablet Take 10 mg by mouth daily.    . clotrimazole-betamethasone (LOTRISONE) cream Apply 1 application topically 2 (two) times daily. 30 g 0  . flecainide (TAMBOCOR) 100 MG tablet Take 100 mg by mouth 2 (two) times daily.    . fluticasone (FLOVENT HFA) 110 MCG/ACT inhaler  Inhale 1 puff into the lungs 2 (two) times daily as needed.    Marland Kitchen glimepiride (AMARYL) 2 MG tablet Take 0.5 tablets (1 mg total) by mouth daily. 30 tablet 5  . hydrochlorothiazide (HYDRODIURIL) 25 MG tablet TAKE 1 TABLET BY MOUTH EVERY DAY 90 tablet 0  . ibuprofen (ADVIL,MOTRIN) 200 MG tablet Take 600 mg by mouth every 4 (four) hours as needed.    . levalbuterol (XOPENEX HFA) 45 MCG/ACT inhaler Inhale 2 puffs into the lungs every 6 (six) hours as needed for wheezing. 1 Inhaler 1  . lisinopril (PRINIVIL,ZESTRIL) 5 MG tablet Take 5 mg by mouth daily.    . metFORMIN (GLUCOPHAGE) 1000 MG tablet TAKE 1 TABLET BY MOUTH TWICE DAILY WITH MEALS 60 tablet 0  . metoprolol tartrate (LOPRESSOR) 25 MG tablet Take 25 mg by mouth 2 (two) times daily.    . Multiple Vitamin (MULTIVITAMIN) tablet Take 1 tablet by mouth daily.    Marland Kitchen oxybutynin (DITROPAN) 5 MG tablet TAKE 1 TABLET BY MOUTH TWICE DAILY. 180 tablet 0  . linagliptin (TRADJENTA) 5 MG TABS tablet Take 1 tablet (5 mg total) by mouth daily. 30 tablet 5  . omeprazole (PRILOSEC) 20 MG capsule Take 20 mg by mouth 2 (two) times  daily before a meal.     No current facility-administered medications for this visit.    Review of Systems Review of Systems  Constitutional: Negative.   Respiratory: Negative.   Cardiovascular: Negative.     Blood pressure 138/82, pulse 78, resp. rate 14, height  (1.626 m), weight 301 lb (136.533 kg).  Physical Exam Physical Exam  Constitutional: She is oriented to person, place, and time. She appears well-developed and well-nourished.  HENT:  Mouth/Throat: Oropharynx is clear and moist.  Eyes: Conjunctivae are normal. No scleral icterus.  Neck: Neck supple.  Cardiovascular: Normal rate and regular rhythm.   Murmur heard.  Systolic murmur is present with a grade of 2/6  Pulmonary/Chest: Effort normal and breath sounds normal. Right breast exhibits no inverted nipple, no mass, no nipple discharge, no skin change and no  tenderness. Left breast exhibits no inverted nipple, no mass, no nipple discharge, no skin change and no tenderness.  Lymphadenopathy:    She has no cervical adenopathy.    She has no axillary adenopathy.  Neurological: She is alert and oriented to person, place, and time.  Skin: Skin is warm and dry.  Psychiatric: Her behavior is normal.    Data Reviewed Live 25, 2016 ultrasound-guided core biopsy:   Breast, left, needle core biopsy, 2 o'clock - FIBROCYSTIC CHANGES. - NO EVIDENCE OF MALIGNANCY.  Postbiopsy films showed the previously identified smoothly marginated mass in the upper-outer quadrant left breast at least 2 cm from the clip suggesting there was discordance between the ultrasound identified mass and the mammographically identified mass.    Assessment    Left upper-outer quadrant breast mass on mammography, separate from previously biopsied ultrasound nodularity.    Plan     The findings of benign biopsy on the ultrasound lesion does not preclude proceeding to biopsy of the area noted on mammography. Stereotactic biopsy of the index lesion in the upper-outer quadrant of the left breast as indicated. The procedure was reviewed.   The patient will be contacted tomorrow for scheduling of a left breast stereotactic biopsy.     PCP:  Letta Median 05/25/2015, 4:44 PM

## 2015-05-10 NOTE — Patient Instructions (Signed)
The patient is aware to call back for any questions or concerns.  

## 2015-05-11 ENCOUNTER — Telehealth: Payer: Self-pay | Admitting: *Deleted

## 2015-05-11 ENCOUNTER — Other Ambulatory Visit: Payer: Self-pay | Admitting: General Surgery

## 2015-05-11 DIAGNOSIS — R928 Other abnormal and inconclusive findings on diagnostic imaging of breast: Secondary | ICD-10-CM

## 2015-05-11 DIAGNOSIS — N6459 Other signs and symptoms in breast: Secondary | ICD-10-CM

## 2015-05-11 NOTE — Telephone Encounter (Signed)
Patient has been scheduled for a left breast stereotactic biopsy at [facility] for 05-16-15 at 1 pm. She will check-in at the [facility] at 12:30 pm. This patient is aware of date, time, and instructions. Patient verbalizes understanding.

## 2015-05-12 ENCOUNTER — Encounter: Payer: Self-pay | Admitting: Internal Medicine

## 2015-05-16 ENCOUNTER — Ambulatory Visit
Admission: RE | Admit: 2015-05-16 | Discharge: 2015-05-16 | Disposition: A | Discharge status: Home / Self Care | Payer: 59 | Source: Ambulatory Visit | Attending: General Surgery | Admitting: General Surgery

## 2015-05-16 DIAGNOSIS — N6459 Other signs and symptoms in breast: Secondary | ICD-10-CM

## 2015-05-16 DIAGNOSIS — R920 Mammographic microcalcification found on diagnostic imaging of breast: Secondary | ICD-10-CM | POA: Diagnosis not present

## 2015-05-16 DIAGNOSIS — R928 Other abnormal and inconclusive findings on diagnostic imaging of breast: Secondary | ICD-10-CM | POA: Diagnosis not present

## 2015-05-16 DIAGNOSIS — N63 Unspecified lump in breast: Secondary | ICD-10-CM | POA: Diagnosis not present

## 2015-05-16 NOTE — Procedures (Signed)
Preop: Density left lower outer quadrant. Post op: Same. Procedure: Left stereo biopsy Surgeon: Donnalee Curry, MD Anesthesia: 1% Xylocaine with epi.15cc. Persistent density left not correlated on ultrasound. Procedure reviewed. Pre/post fire images obtained. 12 core samples. Marker placed. No bleeding. Dressing applied. Post procedure mammo pending.

## 2015-05-17 ENCOUNTER — Telehealth: Payer: Self-pay | Admitting: General Surgery

## 2015-05-17 LAB — SURGICAL PATHOLOGY

## 2015-05-17 NOTE — Telephone Encounter (Signed)
The patient was notified that the biopsy results were benign. Review of the post biopsy mammogram shows good concordance. She reports node pain or discomfort. She will follow-up next week with the nurse for wound check.  We will arrange for a screening mammogram in 1 year and final exam at that time.

## 2015-05-18 ENCOUNTER — Ambulatory Visit (INDEPENDENT_AMBULATORY_CARE_PROVIDER_SITE_OTHER): Payer: 59 | Admitting: Internal Medicine

## 2015-05-18 ENCOUNTER — Encounter: Payer: Self-pay | Admitting: Internal Medicine

## 2015-05-18 VITALS — BP 134/75 | HR 64 | Temp 98.1°F | Ht 64.0 in | Wt 301.2 lb

## 2015-05-18 DIAGNOSIS — E7800 Pure hypercholesterolemia, unspecified: Secondary | ICD-10-CM

## 2015-05-18 DIAGNOSIS — R928 Other abnormal and inconclusive findings on diagnostic imaging of breast: Secondary | ICD-10-CM

## 2015-05-18 DIAGNOSIS — K219 Gastro-esophageal reflux disease without esophagitis: Secondary | ICD-10-CM

## 2015-05-18 DIAGNOSIS — Z23 Encounter for immunization: Secondary | ICD-10-CM | POA: Diagnosis not present

## 2015-05-18 DIAGNOSIS — I10 Essential (primary) hypertension: Secondary | ICD-10-CM | POA: Diagnosis not present

## 2015-05-18 DIAGNOSIS — E118 Type 2 diabetes mellitus with unspecified complications: Secondary | ICD-10-CM

## 2015-05-18 DIAGNOSIS — G4733 Obstructive sleep apnea (adult) (pediatric): Secondary | ICD-10-CM

## 2015-05-18 DIAGNOSIS — I4891 Unspecified atrial fibrillation: Secondary | ICD-10-CM

## 2015-05-18 DIAGNOSIS — Z1211 Encounter for screening for malignant neoplasm of colon: Secondary | ICD-10-CM

## 2015-05-18 NOTE — Progress Notes (Signed)
Pre-visit discussion using our clinic review tool. No additional management support is needed unless otherwise documented below in the visit note.  

## 2015-05-18 NOTE — Progress Notes (Signed)
Patient ID: Christine Robertson, female   DOB: December 13, 1955, 59 y.o.   MRN: [id number]   Subjective:    Patient ID: Christine Robertson, female    DOB: [birth year], 59 y.o.   MRN: [id number]  HPI  Patient here for a scheduled follow up.  Here to f/u regarding her blood pressure and her sugars.  Just had breast biopsy x 2.  Benign pathology.  Recommended f/u mammogram in one year.  States her am sugars are averaging 140 and pm sugars averaging 140-160.  Discussed diet and exercise.  No cardiac symptoms with increased activity or exertion.  No sob. No acid reflux.  No abdominal pain or cramping.  Bowels stable.  Saw Dr Elvina Mattes.  Wearing special shoes - has helped.     Past Medical History  Diagnosis Date  . Arthritis   . Chicken pox   . Diabetes mellitus   . GERD (gastroesophageal reflux disease)   . Allergy     hay fever  . Hypertension   . Urinary incontinence   . IBS (irritable bowel syndrome)   . Sleep apnea     Uses C-Pap machine  . Murmur    Past Surgical History  Procedure Laterality Date  . Abdominal hysterectomy  2010  . Tonsillectomy and adenoidectomy  1959  . Bladder surgery  1967    stretching of bladder  . Breast biopsy Left 03/28/2015    done at Dr. Dwyane Luo office   Family History  Problem Relation Age of Onset  . Diabetes Mother   . Cancer Father     lung  . Breast cancer Maternal Grandmother   . Breast cancer Maternal Aunt    Social History   Social History  . Marital Status: Married    Spouse Name: N/A  . Number of Children: N/A  . Years of Education: N/A   Social History Main Topics  . Smoking status: Never Smoker   . Smokeless tobacco: Never Used  . Alcohol Use: No  . Drug Use: No  . Sexual Activity: Not Asked   Other Topics Concern  . None   Social History Narrative    Outpatient Encounter Prescriptions as of 05/18/2015  Medication Sig  . albuterol (PROVENTIL HFA;VENTOLIN HFA) 108 (90 BASE) MCG/ACT inhaler Inhale 2 puffs into the lungs every 6  (six) hours as needed for wheezing.  Marland Kitchen aspirin 81 MG tablet Take 81 mg by mouth daily.  . cetirizine (ZYRTEC) 10 MG tablet Take 10 mg by mouth daily.  . clotrimazole-betamethasone (LOTRISONE) cream Apply 1 application topically 2 (two) times daily.  . flecainide (TAMBOCOR) 100 MG tablet Take 100 mg by mouth 2 (two) times daily.  . fluticasone (FLOVENT HFA) 110 MCG/ACT inhaler Inhale 1 puff into the lungs 2 (two) times daily as needed.  Marland Kitchen glimepiride (AMARYL) 2 MG tablet Take 0.5 tablets (1 mg total) by mouth daily.  . hydrochlorothiazide (HYDRODIURIL) 25 MG tablet TAKE 1 TABLET BY MOUTH EVERY DAY  . ibuprofen (ADVIL,MOTRIN) 200 MG tablet Take 600 mg by mouth every 4 (four) hours as needed.  . levalbuterol (XOPENEX HFA) 45 MCG/ACT inhaler Inhale 2 puffs into the lungs every 6 (six) hours as needed for wheezing.  Marland Kitchen lisinopril (PRINIVIL,ZESTRIL) 5 MG tablet Take 5 mg by mouth daily.  . metFORMIN (GLUCOPHAGE) 1000 MG tablet TAKE 1 TABLET BY MOUTH TWICE DAILY WITH MEALS  . metoprolol tartrate (LOPRESSOR) 25 MG tablet Take 25 mg by mouth 2 (two) times daily.  . Multiple Vitamin (MULTIVITAMIN) tablet  Take 1 tablet by mouth daily.  Marland Kitchen omeprazole (PRILOSEC) 20 MG capsule Take 20 mg by mouth 2 (two) times daily before a meal.  . oxybutynin (DITROPAN) 5 MG tablet TAKE 1 TABLET BY MOUTH TWICE DAILY.  . [DISCONTINUED] linagliptin (TRADJENTA) 5 MG TABS tablet Take 1 tablet (5 mg total) by mouth daily.   No facility-administered encounter medications on file as of 05/18/2015.    Review of Systems  Constitutional: Negative for appetite change and unexpected weight change.  HENT: Negative for congestion and sinus pressure.   Eyes: Negative for pain and visual disturbance.  Respiratory: Negative for cough, chest tightness and shortness of breath.   Cardiovascular: Negative for chest pain, palpitations and leg swelling.  Gastrointestinal: Negative for nausea, vomiting, abdominal pain and diarrhea.    Genitourinary: Negative for dysuria and difficulty urinating.  Musculoskeletal: Negative for back pain and joint swelling.  Skin: Negative for color change and rash.  Neurological: Negative for dizziness, light-headedness and headaches.  Hematological: Negative for adenopathy. Does not bruise/bleed easily.  Psychiatric/Behavioral: Negative for dysphoric mood and agitation.       Objective:    Physical Exam  Constitutional: She appears well-developed and well-nourished. No distress.  HENT:  Nose: Nose normal.  Mouth/Throat: Oropharynx is clear and moist.  Eyes: Conjunctivae are normal. Right eye exhibits no discharge. Left eye exhibits no discharge.  Neck: Neck supple. No thyromegaly present.  Cardiovascular: Normal rate and regular rhythm.   Pulmonary/Chest: Breath sounds normal. No respiratory distress. She has no wheezes.  Abdominal: Soft. Bowel sounds are normal. There is no tenderness.  Musculoskeletal: She exhibits no edema or tenderness.  Lymphadenopathy:    She has no cervical adenopathy.  Skin: No rash noted. No erythema.  Psychiatric: She has a normal mood and affect. Her behavior is normal.    BP 134/75 mmHg  Pulse 64  Temp(Src) 98.1 F (36.7 C) (Oral)  Ht $R'5\' 4"'jO$  (1.626 m)  Wt 301 lb 4 oz (136.646 kg)  BMI 51.68 kg/m2  SpO2 94% Wt Readings from Last 3 Encounters:  05/18/15 301 lb 4 oz (136.646 kg)  05/10/15 301 lb (136.533 kg)  03/28/15 323 lb (146.512 kg)     Lab Results  Component Value Date   WBC 8.8 06/25/2014   HGB 13.2 06/25/2014   HCT 40.4 06/25/2014   PLT 243.0 06/25/2014   GLUCOSE 122* 04/19/2015   CHOL 170 04/19/2015   TRIG 206.0* 04/19/2015   HDL 48.50 04/19/2015   LDLDIRECT 101.0 04/19/2015   LDLCALC 90 12/16/2014   ALT 34 04/19/2015   AST 24 04/19/2015   NA 139 04/19/2015   K 4.3 04/19/2015   CL 102 04/19/2015   CREATININE 0.70 04/19/2015   BUN 24* 04/19/2015   CO2 29 04/19/2015   TSH 0.80 06/25/2014   INR 0.99 11/15/2012    HGBA1C 6.9* 04/19/2015   MICROALBUR 0.7 04/19/2015       Assessment & Plan:   Problem List Items Addressed This Visit    Abnormal mammogram of left breast    S/p breast biopsy x 2.  Ok.  Follow. Recommended f/u mammogram in one year.       Atrial fibrillation    Followed by Dr Ubaldo Glassing.  On eliquis.  Same medication regimen.  Follow.  No increased heart rate or palpitations.        Relevant Orders   CBC with Differential/Platelet   TSH   Diabetes mellitus    Sugars as outlined.  Diet and exercise.  Follow met b and a1c.       Relevant Orders   Hemoglobin A1c   GERD (gastroesophageal reflux disease)    Controlled on omeprazole.        Relevant Medications   omeprazole (PRILOSEC) 20 MG capsule   Hypercholesteremia    Low cholesterol diet and exercise.  Follow lipid panel.        Relevant Orders   Lipid panel   Hepatic function panel   Hypertension    Blood pressure under good control.  Continue same medication regimen.  Follow pressures.  Follow metabolic panel.        Relevant Orders   Basic metabolic panel   Obstructive sleep apnea    CPAP.        Severe obesity (BMI >= 40)    Diet and exercise.         Other Visit Diagnoses    Encounter for immunization    -  Primary    Colon cancer screening        Relevant Orders    Ambulatory referral to Gastroenterology        Einar Pheasant, MD

## 2015-05-20 ENCOUNTER — Other Ambulatory Visit: Payer: Self-pay | Admitting: *Deleted

## 2015-05-20 MED ORDER — LINAGLIPTIN 5 MG PO TABS: 5.0000 mg | ORAL_TABLET | Freq: Every day | ORAL | Status: DC

## 2015-05-21 ENCOUNTER — Encounter: Payer: Self-pay | Admitting: Internal Medicine

## 2015-05-22 NOTE — Assessment & Plan Note (Signed)
Low cholesterol diet and exercise.  Follow lipid panel.   

## 2015-05-22 NOTE — Assessment & Plan Note (Signed)
Diet and exercise.   

## 2015-05-22 NOTE — Assessment & Plan Note (Signed)
Controlled on omeprazole.   

## 2015-05-22 NOTE — Assessment & Plan Note (Signed)
S/p breast biopsy x 2.  Ok.  Follow. Recommended f/u mammogram in one year.

## 2015-05-22 NOTE — Assessment & Plan Note (Signed)
Sugars as outlined.  Diet and exercise.  Follow met b and a1c.

## 2015-05-22 NOTE — Assessment & Plan Note (Signed)
CPAP.  

## 2015-05-22 NOTE — Assessment & Plan Note (Signed)
Followed by Dr Lady Gary.  On eliquis.  Same medication regimen.  Follow.  No increased heart rate or palpitations.

## 2015-05-22 NOTE — Assessment & Plan Note (Signed)
Blood pressure under good control.  Continue same medication regimen.  Follow pressures.  Follow metabolic panel.   

## 2015-05-23 ENCOUNTER — Ambulatory Visit: Payer: 59

## 2015-05-23 ENCOUNTER — Ambulatory Visit (INDEPENDENT_AMBULATORY_CARE_PROVIDER_SITE_OTHER): Payer: 59 | Admitting: *Deleted

## 2015-05-23 DIAGNOSIS — R928 Other abnormal and inconclusive findings on diagnostic imaging of breast: Secondary | ICD-10-CM

## 2015-05-23 NOTE — Progress Notes (Signed)
Patient here today for follow up post left  breast stereo. Steristrip in place and aware it may come off in one week.  Minimal bruising noted.  The patient is aware that a heating pad may be used for comfort as needed.  Aware of pathology. Follow up as scheduled. 

## 2015-05-24 ENCOUNTER — Ambulatory Visit: Payer: 59

## 2015-06-01 ENCOUNTER — Ambulatory Visit (INDEPENDENT_AMBULATORY_CARE_PROVIDER_SITE_OTHER): Payer: 59 | Admitting: *Deleted

## 2015-06-01 DIAGNOSIS — Z23 Encounter for immunization: Secondary | ICD-10-CM

## 2015-06-02 ENCOUNTER — Other Ambulatory Visit: Payer: Self-pay | Admitting: Internal Medicine

## 2015-06-16 ENCOUNTER — Other Ambulatory Visit: Payer: Self-pay | Admitting: Internal Medicine

## 2015-06-28 ENCOUNTER — Telehealth: Payer: Self-pay | Admitting: *Deleted

## 2015-06-28 DIAGNOSIS — Z1211 Encounter for screening for malignant neoplasm of colon: Secondary | ICD-10-CM

## 2015-06-28 DIAGNOSIS — I4891 Unspecified atrial fibrillation: Secondary | ICD-10-CM

## 2015-06-28 NOTE — Telephone Encounter (Signed)
Order placed for cardiology referral and GI referral.

## 2015-06-28 NOTE — Telephone Encounter (Signed)
Pt called needing referral for two upcoming appointments: [facility] (6 mth f/u) & Dr. Mechele CollinElliott (for Fransico SettersKim Mills, NP) @ [facility] on 08/04/15 (for colonoscopy)

## 2015-07-02 ENCOUNTER — Other Ambulatory Visit: Payer: Self-pay | Admitting: Internal Medicine

## 2015-07-25 ENCOUNTER — Other Ambulatory Visit: Payer: Self-pay | Admitting: Internal Medicine

## 2015-08-18 ENCOUNTER — Other Ambulatory Visit (INDEPENDENT_AMBULATORY_CARE_PROVIDER_SITE_OTHER): Payer: 59

## 2015-08-18 DIAGNOSIS — I10 Essential (primary) hypertension: Secondary | ICD-10-CM

## 2015-08-18 DIAGNOSIS — E7800 Pure hypercholesterolemia, unspecified: Secondary | ICD-10-CM

## 2015-08-18 DIAGNOSIS — E118 Type 2 diabetes mellitus with unspecified complications: Secondary | ICD-10-CM

## 2015-08-18 DIAGNOSIS — I4891 Unspecified atrial fibrillation: Secondary | ICD-10-CM

## 2015-08-18 LAB — HEPATIC FUNCTION PANEL
ALK PHOS: 63 U/L (ref 39–117)
ALT: 43 U/L — ABNORMAL HIGH (ref 0–35)
AST: 31 U/L (ref 0–37)
Albumin: 4 g/dL (ref 3.5–5.2)
BILIRUBIN DIRECT: 0.1 mg/dL (ref 0.0–0.3)
BILIRUBIN TOTAL: 0.3 mg/dL (ref 0.2–1.2)
Total Protein: 7.2 g/dL (ref 6.0–8.3)

## 2015-08-18 LAB — CBC WITH DIFFERENTIAL/PLATELET
Basophils Absolute: 0 10*3/uL (ref 0.0–0.1)
Basophils Relative: 0.4 % (ref 0.0–3.0)
Eosinophils Absolute: 0.3 10*3/uL (ref 0.0–0.7)
Eosinophils Relative: 3 % (ref 0.0–5.0)
HCT: 41.4 % (ref 36.0–46.0)
Hemoglobin: 14 g/dL (ref 12.0–15.0)
LYMPHS ABS: 1.8 10*3/uL (ref 0.7–4.0)
LYMPHS PCT: 21.1 % (ref 12.0–46.0)
MCHC: 33.7 g/dL (ref 30.0–36.0)
MCV: 87 fl (ref 78.0–100.0)
MONO ABS: 0.7 10*3/uL (ref 0.1–1.0)
Monocytes Relative: 8.7 % (ref 3.0–12.0)
NEUTROS PCT: 66.8 % (ref 43.0–77.0)
Neutro Abs: 5.7 10*3/uL (ref 1.4–7.7)
Platelets: 242 10*3/uL (ref 150.0–400.0)
RBC: 4.76 Mil/uL (ref 3.87–5.11)
RDW: 13.3 % (ref 11.5–15.5)
WBC: 8.5 10*3/uL (ref 4.0–10.5)

## 2015-08-18 LAB — BASIC METABOLIC PANEL
BUN: 25 mg/dL — ABNORMAL HIGH (ref 6–23)
CALCIUM: 9.6 mg/dL (ref 8.4–10.5)
CHLORIDE: 101 meq/L (ref 96–112)
CO2: 29 meq/L (ref 19–32)
CREATININE: 0.75 mg/dL (ref 0.40–1.20)
GFR: 83.4 mL/min (ref >60.00)
Glucose, Bld: 185 mg/dL — ABNORMAL HIGH (ref 70–99)
Potassium: 4 mEq/L (ref 3.5–5.1)
SODIUM: 139 meq/L (ref 135–145)

## 2015-08-18 LAB — LIPID PANEL
CHOL/HDL RATIO: 4
Cholesterol: 188 mg/dL (ref 0–200)
HDL: 48.6 mg/dL (ref >39.00)
LDL Cholesterol: 112 mg/dL — ABNORMAL HIGH (ref 0–99)
NONHDL: 138.9
Triglycerides: 137 mg/dL (ref 0.0–149.0)
VLDL: 27.4 mg/dL (ref 0.0–40.0)

## 2015-08-18 LAB — TSH: TSH: 0.96 u[IU]/mL (ref 0.35–4.50)

## 2015-08-18 LAB — HEMOGLOBIN A1C: Hgb A1c MFr Bld: 7.1 % — ABNORMAL HIGH (ref 4.6–6.5)

## 2015-08-19 ENCOUNTER — Encounter: Payer: Self-pay | Admitting: Internal Medicine

## 2015-08-19 ENCOUNTER — Ambulatory Visit (INDEPENDENT_AMBULATORY_CARE_PROVIDER_SITE_OTHER): Payer: 59 | Admitting: Internal Medicine

## 2015-08-19 VITALS — BP 123/77 | HR 64 | Temp 98.0°F | Resp 18 | Ht 64.0 in | Wt 298.4 lb

## 2015-08-19 DIAGNOSIS — I10 Essential (primary) hypertension: Secondary | ICD-10-CM | POA: Diagnosis not present

## 2015-08-19 DIAGNOSIS — L989 Disorder of the skin and subcutaneous tissue, unspecified: Secondary | ICD-10-CM

## 2015-08-19 DIAGNOSIS — E118 Type 2 diabetes mellitus with unspecified complications: Secondary | ICD-10-CM | POA: Diagnosis not present

## 2015-08-19 DIAGNOSIS — R0981 Nasal congestion: Secondary | ICD-10-CM

## 2015-08-19 DIAGNOSIS — I4891 Unspecified atrial fibrillation: Secondary | ICD-10-CM

## 2015-08-19 DIAGNOSIS — R21 Rash and other nonspecific skin eruption: Secondary | ICD-10-CM

## 2015-08-19 DIAGNOSIS — K219 Gastro-esophageal reflux disease without esophagitis: Secondary | ICD-10-CM

## 2015-08-19 DIAGNOSIS — E7800 Pure hypercholesterolemia, unspecified: Secondary | ICD-10-CM

## 2015-08-19 DIAGNOSIS — G4733 Obstructive sleep apnea (adult) (pediatric): Secondary | ICD-10-CM

## 2015-08-19 DIAGNOSIS — R928 Other abnormal and inconclusive findings on diagnostic imaging of breast: Secondary | ICD-10-CM

## 2015-08-19 NOTE — Patient Instructions (Signed)
Saline nasal spray - flush nose at least 2-3x/day  nasacort nasal spray - 2 sprays each nostril one time per day.  Do this in the evening.   Robitussin - twice a day as needed.   

## 2015-08-19 NOTE — Progress Notes (Signed)
Pre-visit discussion using our clinic review tool. No additional management support is needed unless otherwise documented below in the visit note.  

## 2015-08-19 NOTE — Progress Notes (Signed)
Patient ID: Christine Robertson, female   DOB: [birth year], 59 y.o.   MRN: [id number]   Subjective:    Patient ID: Christine Robertson, female    DOB: 1955-12-14, 59 y.o.   MRN: [id number]  HPI  Patient with past history of hypercholesterolemia, diabetes, GERD, allergies and hypertension.  She comes in today to follow up on these issues.  She reports that starting 5-6 days ago,she developed sore throat and low grade temp (tmax 100).  Some increased nasal congestion and drainage.  Some wheezing.  She does feel better today.  No chest tightness.  No chest pain.  No increased heart rate or palpitations.  No nausea or vomiting.  Bowels stable.  She has been having some right lower back pain.  Intermittent.  Better today.  Some rash over her lower legs.  No itching.  No pain.    Past Medical History  Diagnosis Date  . Arthritis   . Chicken pox   . Diabetes mellitus   . GERD (gastroesophageal reflux disease)   . Allergy     hay fever  . Hypertension   . Urinary incontinence   . IBS (irritable bowel syndrome)   . Sleep apnea     Uses C-Pap machine  . Murmur    Past Surgical History  Procedure Laterality Date  . Abdominal hysterectomy  2010  . Tonsillectomy and adenoidectomy  1959  . Bladder surgery  1967    stretching of bladder  . Breast biopsy Left 03/28/2015    done at Dr. Rutherford Nail office   Family History  Problem Relation Age of Onset  . Diabetes Mother   . Cancer Father     lung  . Breast cancer Maternal Grandmother   . Breast cancer Maternal Aunt    Social History   Social History  . Marital Status: Married    Spouse Name: N/A  . Number of Children: N/A  . Years of Education: N/A   Social History Main Topics  . Smoking status: Never Smoker   . Smokeless tobacco: Never Used  . Alcohol Use: No  . Drug Use: No  . Sexual Activity: Not Asked   Other Topics Concern  . None   Social History Narrative    Outpatient Encounter Prescriptions as of 08/19/2015  Medication Sig    . aspirin 81 MG tablet Take 81 mg by mouth daily.  . cetirizine (ZYRTEC) 10 MG tablet Take 10 mg by mouth daily.  . clotrimazole-betamethasone (LOTRISONE) cream Apply 1 application topically 2 (two) times daily.  . flecainide (TAMBOCOR) 100 MG tablet Take 100 mg by mouth 2 (two) times daily.  . fluticasone (FLOVENT HFA) 110 MCG/ACT inhaler Inhale 1 puff into the lungs 2 (two) times daily as needed.  Marland Kitchen glimepiride (AMARYL) 2 MG tablet Take 0.5 tablets (1 mg total) by mouth daily.  . hydrochlorothiazide (HYDRODIURIL) 25 MG tablet TAKE 1 TABLET BY MOUTH EVERY DAY  . ibuprofen (ADVIL,MOTRIN) 200 MG tablet Take 600 mg by mouth every 4 (four) hours as needed.  . linagliptin (TRADJENTA) 5 MG TABS tablet Take 1 tablet (5 mg total) by mouth daily.  Marland Kitchen lisinopril (PRINIVIL,ZESTRIL) 5 MG tablet Take 5 mg by mouth daily.  . metFORMIN (GLUCOPHAGE) 1000 MG tablet TAKE 1 TABLET BY MOUTH TWICE DAILY WITH MEALS  . metoprolol tartrate (LOPRESSOR) 25 MG tablet Take 25 mg by mouth 2 (two) times daily.  . Multiple Vitamin (MULTIVITAMIN) tablet Take 1 tablet by mouth daily.  Marland Kitchen omeprazole (PRILOSEC) 20 MG  capsule Take 20 mg by mouth 2 (two) times daily before a meal.  . oxybutynin (DITROPAN) 5 MG tablet TAKE 1 TABLET BY MOUTH TWICE DAILY  . Probiotic Product (PROBIOTIC PO) Take by mouth.  . [DISCONTINUED] levalbuterol (XOPENEX HFA) 45 MCG/ACT inhaler Inhale 2 puffs into the lungs every 6 (six) hours as needed for wheezing.  Marland Kitchen. albuterol (PROVENTIL HFA;VENTOLIN HFA) 108 (90 BASE) MCG/ACT inhaler Inhale 2 puffs into the lungs every 6 (six) hours as needed for wheezing. (Patient not taking: Reported on 08/19/2015)   No facility-administered encounter medications on file as of 08/19/2015.    Review of Systems  Constitutional: Negative for appetite change and unexpected weight change.  HENT:       Increased nasal congestion, drainage and wheezing.  No chest tightness.  Some wheezing.  Feels better. Now.  No significant  chest congestion and cough.  No sob.    Eyes: Negative for pain and discharge.  Respiratory: Negative for cough, chest tightness and shortness of breath.   Cardiovascular: Negative for chest pain and palpitations.       No increased leg swelling.   Gastrointestinal: Negative for nausea, vomiting, abdominal pain and diarrhea.  Genitourinary: Negative for dysuria and difficulty urinating.  Musculoskeletal: Positive for back pain.  Skin: Positive for rash (lower extremitites.  ). Negative for color change.  Neurological: Negative for dizziness, light-headedness and headaches.  Psychiatric/Behavioral: Negative for dysphoric mood and agitation.       Objective:    Physical Exam  Constitutional: She appears well-developed and well-nourished. No distress.  HENT:  Mouth/Throat: Oropharynx is clear and moist. No oropharyngeal exudate.  Nares - slightly erythematous turbinates.    Eyes: Conjunctivae are normal. Right eye exhibits no discharge. Left eye exhibits no discharge.  Neck: Neck supple. No thyromegaly present.  Cardiovascular: Normal rate and regular rhythm.   Pulmonary/Chest: Breath sounds normal. No respiratory distress. She has no wheezes.  Abdominal: Soft. Bowel sounds are normal. There is no tenderness.  Musculoskeletal: She exhibits no edema or tenderness.  Lymphadenopathy:    She has no cervical adenopathy.  Skin: No erythema.  Raised rash - rough skin with some dryness.    Psychiatric: She has a normal mood and affect.    BP 123/77 mmHg  Pulse 64  Temp(Src) 98 F (36.7 C) (Oral)  Resp 18  Ht 5\' 4"  (1.626 m)  Wt 298 lb 6 oz (135.342 kg)  BMI 51.19 kg/m2  SpO2 95% Wt Readings from Last 3 Encounters:  08/19/15 298 lb 6 oz (135.342 kg)  05/18/15 301 lb 4 oz (136.646 kg)  05/10/15 301 lb (136.533 kg)     Lab Results  Component Value Date   WBC 8.5 08/18/2015   HGB 14.0 08/18/2015   HCT 41.4 08/18/2015   PLT 242.0 08/18/2015   GLUCOSE 185* 08/18/2015   CHOL  188 08/18/2015   TRIG 137.0 08/18/2015   HDL 48.60 08/18/2015   LDLDIRECT 101.0 04/19/2015   LDLCALC 112* 08/18/2015   ALT 43* 08/18/2015   AST 31 08/18/2015   NA 139 08/18/2015   K 4.0 08/18/2015   CL 101 08/18/2015   CREATININE 0.75 08/18/2015   BUN 25* 08/18/2015   CO2 29 08/18/2015   TSH 0.96 08/18/2015   INR 0.99 11/15/2012   HGBA1C 7.1* 08/18/2015   MICROALBUR 0.7 04/19/2015       Assessment & Plan:   Problem List Items Addressed This Visit    Abnormal mammogram of left breast    S/p  breast biopsy x 2.  Saw Dr Lemar Livings.  See his note for details.  They are going to schedule f/u mammogram one year from last.        Atrial fibrillation (HCC)    Stable.  Appears to be in SR.  On eliquis.  Follow.  Continue f/u with cardiology.        Diabetes mellitus (HCC) - Primary    a1c just checked and slightly increased from last check - 7.1.  Low carb diet and exercise.  Weight loss.  Follow metabolic panel and a1c.        Relevant Orders   Hemoglobin A1c   GERD (gastroesophageal reflux disease)    Controlled on omeprazole.        Relevant Medications   Probiotic Product (PROBIOTIC PO)   Hand lesion    Persistent raised lesion - hand.  Refer to dermatology.        Relevant Orders   Ambulatory referral to Dermatology   Hypercholesteremia    Low cholesterol diet and exercise.  Follow lipid panel.        Relevant Orders   Lipid panel   Hepatic function panel   Hypertension    Blood pressure under good control.  Continue same medication regimen.  Follow pressures.  Follow metabolic panel.        Relevant Orders   Basic metabolic panel   Nasal congestion    Increased nasal congestion and drainage as outlined.  Previous fever.  Symptoms have improved.  Saline nasal spray and nasacort nasal spray as outlined.  Robitussin as directed.  Hold on abx.  Follow.  Notify me if symptoms persists or do not resolve.        Obstructive sleep apnea    CPAP.       Rash     Ankle and foot rash.  lotrisone not helping.  Refer to dermatology for evaluation.        Relevant Orders   Ambulatory referral to Dermatology   Severe obesity (BMI >= 40) (HCC)    Diet and exercise.  Follow.            Dale McFarland, MD

## 2015-08-21 ENCOUNTER — Encounter: Payer: Self-pay | Admitting: Internal Medicine

## 2015-08-21 DIAGNOSIS — R0981 Nasal congestion: Secondary | ICD-10-CM | POA: Insufficient documentation

## 2015-08-21 DIAGNOSIS — L989 Disorder of the skin and subcutaneous tissue, unspecified: Secondary | ICD-10-CM | POA: Insufficient documentation

## 2015-08-21 NOTE — Assessment & Plan Note (Signed)
Ankle and foot rash.  lotrisone not helping.  Refer to dermatology for evaluation.

## 2015-08-21 NOTE — Assessment & Plan Note (Signed)
a1c just checked and slightly increased from last check - 7.1.  Low carb diet and exercise.  Weight loss.  Follow metabolic panel and a1c.

## 2015-08-21 NOTE — Assessment & Plan Note (Signed)
Diet and exercise.  Follow.  

## 2015-08-21 NOTE — Assessment & Plan Note (Signed)
CPAP.  

## 2015-08-21 NOTE — Assessment & Plan Note (Signed)
Blood pressure under good control.  Continue same medication regimen.  Follow pressures.  Follow metabolic panel.   

## 2015-08-21 NOTE — Assessment & Plan Note (Signed)
Increased nasal congestion and drainage as outlined.  Previous fever.  Symptoms have improved.  Saline nasal spray and nasacort nasal spray as outlined.  Robitussin as directed.  Hold on abx.  Follow.  Notify me if symptoms persists or do not resolve.

## 2015-08-21 NOTE — Assessment & Plan Note (Signed)
Low cholesterol diet and exercise.  Follow lipid panel.   

## 2015-08-21 NOTE — Assessment & Plan Note (Signed)
Persistent raised lesion - hand.  Refer to dermatology.

## 2015-08-21 NOTE — Assessment & Plan Note (Signed)
Stable.  Appears to be in SR.  On eliquis.  Follow.  Continue f/u with cardiology.

## 2015-08-21 NOTE — Assessment & Plan Note (Signed)
Controlled on omeprazole.   

## 2015-08-21 NOTE — Assessment & Plan Note (Signed)
S/p breast biopsy x 2.  Saw Dr Lemar LivingsByrnett.  See his note for details.  They are going to schedule f/u mammogram one year from last.

## 2015-08-23 ENCOUNTER — Encounter: Payer: Self-pay | Admitting: Internal Medicine

## 2015-08-26 ENCOUNTER — Other Ambulatory Visit: Payer: Self-pay | Admitting: Internal Medicine

## 2015-09-08 ENCOUNTER — Encounter: Payer: Self-pay | Admitting: *Deleted

## 2015-09-09 ENCOUNTER — Encounter
Admission: RE | Disposition: A | Discharge status: Home / Self Care | Payer: Self-pay | Source: Ambulatory Visit | Attending: Unknown Physician Specialty

## 2015-09-09 ENCOUNTER — Encounter: Payer: Self-pay | Admitting: *Deleted

## 2015-09-09 ENCOUNTER — Ambulatory Visit: Payer: 59 | Admitting: Anesthesiology

## 2015-09-09 ENCOUNTER — Ambulatory Visit
Admission: RE | Admit: 2015-09-09 | Discharge: 2015-09-09 | Disposition: A | Discharge status: Home / Self Care | Payer: 59 | Source: Ambulatory Visit | Attending: Unknown Physician Specialty | Admitting: Unknown Physician Specialty

## 2015-09-09 DIAGNOSIS — Z7951 Long term (current) use of inhaled steroids: Secondary | ICD-10-CM | POA: Insufficient documentation

## 2015-09-09 DIAGNOSIS — Z7982 Long term (current) use of aspirin: Secondary | ICD-10-CM | POA: Insufficient documentation

## 2015-09-09 DIAGNOSIS — M1990 Unspecified osteoarthritis, unspecified site: Secondary | ICD-10-CM | POA: Diagnosis not present

## 2015-09-09 DIAGNOSIS — J449 Chronic obstructive pulmonary disease, unspecified: Secondary | ICD-10-CM | POA: Insufficient documentation

## 2015-09-09 DIAGNOSIS — I10 Essential (primary) hypertension: Secondary | ICD-10-CM | POA: Insufficient documentation

## 2015-09-09 DIAGNOSIS — Z8371 Family history of colonic polyps: Secondary | ICD-10-CM | POA: Diagnosis not present

## 2015-09-09 DIAGNOSIS — K640 First degree hemorrhoids: Secondary | ICD-10-CM | POA: Insufficient documentation

## 2015-09-09 DIAGNOSIS — Z1211 Encounter for screening for malignant neoplasm of colon: Secondary | ICD-10-CM | POA: Insufficient documentation

## 2015-09-09 DIAGNOSIS — E119 Type 2 diabetes mellitus without complications: Secondary | ICD-10-CM | POA: Diagnosis not present

## 2015-09-09 DIAGNOSIS — G4730 Sleep apnea, unspecified: Secondary | ICD-10-CM | POA: Diagnosis not present

## 2015-09-09 DIAGNOSIS — Z7984 Long term (current) use of oral hypoglycemic drugs: Secondary | ICD-10-CM | POA: Insufficient documentation

## 2015-09-09 DIAGNOSIS — Z6841 Body Mass Index (BMI) 40.0 and over, adult: Secondary | ICD-10-CM | POA: Diagnosis not present

## 2015-09-09 DIAGNOSIS — Z79899 Other long term (current) drug therapy: Secondary | ICD-10-CM | POA: Insufficient documentation

## 2015-09-09 HISTORY — DX: Other malaise: R53.81

## 2015-09-09 HISTORY — DX: Proteinuria, unspecified: R80.9

## 2015-09-09 HISTORY — DX: Essential (primary) hypertension: I10

## 2015-09-09 HISTORY — DX: Rash and other nonspecific skin eruption: R21

## 2015-09-09 HISTORY — DX: Abnormal results of thyroid function studies: R94.6

## 2015-09-09 HISTORY — DX: Pain in unspecified limb: M79.609

## 2015-09-09 HISTORY — DX: Angina pectoris, unspecified: I20.9

## 2015-09-09 HISTORY — DX: Congenital malformations of adrenal gland: Q89.1

## 2015-09-09 HISTORY — PX: COLONOSCOPY WITH PROPOFOL: SHX5780

## 2015-09-09 HISTORY — DX: Unspecified disturbances of skin sensation: R20.9

## 2015-09-09 HISTORY — DX: Pure hypercholesterolemia, unspecified: E78.00

## 2015-09-09 HISTORY — DX: Morbid (severe) obesity due to excess calories: E66.01

## 2015-09-09 HISTORY — DX: Unspecified atrial fibrillation: I48.91

## 2015-09-09 LAB — HM COLONOSCOPY

## 2015-09-09 LAB — GLUCOSE, CAPILLARY: Glucose-Capillary: 176 mg/dL — ABNORMAL HIGH (ref 65–99)

## 2015-09-09 SURGERY — COLONOSCOPY WITH PROPOFOL
Anesthesia: General

## 2015-09-09 MED ORDER — PROPOFOL 10 MG/ML IV BOLUS
INTRAVENOUS | Status: DC | PRN
  Administered 2015-09-09: 68.1 mg via INTRAVENOUS

## 2015-09-09 MED ORDER — PROPOFOL 500 MG/50ML IV EMUL
INTRAVENOUS | Status: DC | PRN
Start: 2015-09-09 — End: 2015-09-09
  Administered 2015-09-09: 120 ug/kg/min via INTRAVENOUS

## 2015-09-09 MED ORDER — FENTANYL CITRATE (PF) 100 MCG/2ML IJ SOLN
INTRAMUSCULAR | Status: DC | PRN
  Administered 2015-09-09: 50 ug via INTRAVENOUS

## 2015-09-09 MED ORDER — SODIUM CHLORIDE 0.9 % IV SOLN: INTRAVENOUS | Status: DC

## 2015-09-09 MED ORDER — MIDAZOLAM HCL 5 MG/5ML IJ SOLN
INTRAMUSCULAR | Status: DC | PRN
  Administered 2015-09-09: 1 mg via INTRAVENOUS

## 2015-09-09 MED ORDER — SODIUM CHLORIDE 0.9 % IV SOLN
INTRAVENOUS | Status: DC
  Administered 2015-09-09 (×2): via INTRAVENOUS

## 2015-09-09 NOTE — Anesthesia Postprocedure Evaluation (Signed)
Anesthesia Post Note  Patient: Christine Robertson  Procedure(s) Performed: Procedure(s) (LRB): COLONOSCOPY WITH PROPOFOL (N/A)  Patient location during evaluation: PACU Anesthesia Type: General Level of consciousness: awake and alert Pain management: pain level controlled Vital Signs Assessment: post-procedure vital signs reviewed and stable Respiratory status: spontaneous breathing and respiratory function stable Cardiovascular status: stable Anesthetic complications: no    Last Vitals:  Filed Vitals:   09/09/15 0927 09/09/15 1014  BP: 175/67   Pulse: 66 57  Temp: 36.4 C 36.2 C  Resp: 16 23    Last Pain: There were no vitals filed for this visit.               KEPHART,WILLIAM K

## 2015-09-09 NOTE — Transfer of Care (Signed)
Immediate Anesthesia Transfer of Care Note  Patient: Christine Robertson  Procedure(s) Performed: Procedure(s): COLONOSCOPY WITH PROPOFOL (N/A)  Patient Location: PACU  Anesthesia Type:General  Level of Consciousness: awake  Airway & Oxygen Therapy: Patient Spontanous Breathing  Post-op Assessment: Report given to RN  Post vital signs: stable  Last Vitals:  Filed Vitals:   09/09/15 0927  BP: 175/67  Pulse: 66  Temp: 36.4 C  Resp: 16    Complications: No apparent anesthesia complications

## 2015-09-09 NOTE — Op Note (Signed)
[facility] Gastroenterology Patient Name: Christine Robertson Procedure Date: 09/09/2015 9:52 AM MRN: [id number] Account #: [id number] Date of Birth: 04/21/1956 Admit Type: Outpatient Age: 89 [facility] ROOM 1 Gender: Female Note Status: Finalized Procedure:         Colonoscopy Indications:       Colon cancer screening in patient at increased risk:                     Family history of 1st-degree relative with colon polyps Providers:         Scot Junobert T. Sloane Palmer, MD Referring MD:      Dale Durhamharlene Scott, MD (Referring MD) Medicines:         Propofol per Anesthesia Complications:     No immediate complications. Procedure:         Pre-Anesthesia Assessment:                    - After reviewing the risks and benefits, the patient was                     deemed in satisfactory condition to undergo the procedure.                    After obtaining informed consent, the colonoscope was                     passed under direct vision. Throughout the procedure, the                     patient's blood pressure, pulse, and oxygen saturations                     were monitored continuously. The Colonoscope was                     introduced through the anus and advanced to the the cecum,                     identified by appendiceal orifice and ileocecal valve. The                     colonoscopy was performed without difficulty. The patient                     tolerated the procedure well. The quality of the bowel                     preparation was good. Findings:      Internal hemorrhoids were found during endoscopy. The hemorrhoids were       small and Grade I (internal hemorrhoids that do not prolapse).      The exam was otherwise without abnormality. Impression:        - Internal hemorrhoids.                    - The examination was otherwise normal.                    - No specimens collected. Recommendation:    - Repeat colonoscopy in 5 years for screening purposes. Scot Junobert  T [name], MD 09/09/2015 10:11:16 AM This report has been signed electronically. Number of Addenda: 0 Note Initiated On: 09/09/2015 9:52 AM Scope Withdrawal Time: 0 hours 6 minutes 26 seconds  Total Procedure Duration: 0  hours 10 minutes 45 seconds       [facility]

## 2015-09-09 NOTE — H&P (Signed)
Primary Care Physician:  Dale DurhamSCOTT, CHARLENE, MD Primary Gastroenterologist:  Dr. Mechele CollinElliott  Pre-Procedure History & Physical: HPI:  Christine Robertson is a 60 y.o. female is here for an colonoscopy.   Past Medical History  Diagnosis Date  . Arthritis   . Chicken pox   . Allergy     hay fever  . Urinary incontinence   . IBS (irritable bowel syndrome)   . Murmur   . Atrial fibrillation, unspecified   . Diabetes mellitus     type 2, uncomplicated  . Cold hands   . Pain in soft tissues of limb   . Morbid obesity (HCC)   . Malaise   . Sleep apnea     Obstructive; Uses C-Pap machine  . Angina pectoris, unspecified (HCC)   . Essential hypertension, benign   . Rash and other nonspecific skin eruption   . Congenital anomaly of adrenal gland   . Proteinuria   . Abnormal results of thyroid function studies   . GERD (gastroesophageal reflux disease)   . Pure hypercholesterolemia     Past Surgical History  Procedure Laterality Date  . Abdominal hysterectomy  2010  . Tonsillectomy and adenoidectomy  1959  . Bladder surgery  1967    stretching of bladder  . Breast biopsy Left 03/28/2015    done at Dr. Rutherford NailByrnett's office  . Colonoscopy    . Esophagogastroduodenoscopy    . Urethral dilation      Prior to Admission medications   Medication Sig Start Date End Date Taking? Authorizing Provider  aspirin 81 MG tablet Take 81 mg by mouth daily.   Yes Historical Provider, MD  budesonide (PULMICORT) 180 MCG/ACT inhaler Inhale 2 puffs into the lungs 2 (two) times daily.   Yes Historical Provider, MD  cetirizine (ZYRTEC) 10 MG tablet Take 10 mg by mouth daily.   Yes Historical Provider, MD  clotrimazole-betamethasone (LOTRISONE) cream Apply 1 application topically 2 (two) times daily. 01/21/14  Yes Dale Durhamharlene Scott, MD  flecainide (TAMBOCOR) 100 MG tablet Take 100 mg by mouth 2 (two) times daily.   Yes Historical Provider, MD  fluticasone (FLOVENT HFA) 110 MCG/ACT inhaler Inhale 600 puffs into the  lungs 2 (two) times daily as needed.  11/10/12  Yes Dale Durhamharlene Scott, MD  glimepiride (AMARYL) 2 MG tablet Take 0.5 tablets (1 mg total) by mouth daily. 11/11/14  Yes Dale Durhamharlene Scott, MD  levalbuterol [facility](XOPENEX HFA) 45 MCG/ACT inhaler Inhale 2 puffs into the lungs every 6 (six) hours as needed for wheezing.   Yes Historical Provider, MD  linagliptin (TRADJENTA) 5 MG TABS tablet Take 1 tablet (5 mg total) by mouth daily. 05/20/15  Yes Dale Durhamharlene Scott, MD  lisinopril (PRINIVIL,ZESTRIL) 5 MG tablet Take 5 mg by mouth daily.   Yes Dalia HeadingKenneth A Fath, MD  metFORMIN (GLUCOPHAGE) 1000 MG tablet TAKE 1 TABLET BY MOUTH TWICE DAILY WITH MEALS 07/03/15  Yes Dale Durhamharlene Scott, MD  metoprolol tartrate (LOPRESSOR) 25 MG tablet Take 25 mg by mouth 2 (two) times daily.   Yes Dalia HeadingKenneth A Fath, MD  Multiple Vitamin (MULTIVITAMIN) tablet Take 1 tablet by mouth daily.   Yes Historical Provider, MD  omeprazole (PRILOSEC) 20 MG capsule TAKE 1 CAPSULE BY MOUTH TWICE DAILY 08/26/15  Yes Dale Durhamharlene Scott, MD  oxybutynin (DITROPAN) 5 MG tablet TAKE 1 TABLET BY MOUTH TWICE DAILY 07/25/15  Yes Dale Durhamharlene Scott, MD  Probiotic Product (PROBIOTIC PO) Take by mouth.   Yes Historical Provider, MD  sitaGLIPtin (JANUVIA) 100 MG tablet Take 100 mg  by mouth daily.   Yes Historical Provider, MD  albuterol (PROVENTIL HFA;VENTOLIN HFA) 108 (90 BASE) MCG/ACT inhaler Inhale 2 puffs into the lungs every 6 (six) hours as needed for wheezing. Patient not taking: Reported on 08/19/2015 11/10/12   Dale [place], MD  hydrochlorothiazide (HYDRODIURIL) 25 MG tablet TAKE 1 TABLET BY MOUTH EVERY DAY 06/16/15   Dale [place], MD  ibuprofen (ADVIL,MOTRIN) 200 MG tablet Take 600 mg by mouth every 4 (four) hours as needed.    Historical Provider, MD    Allergies as of 08/16/2015 - Review Complete 06/01/2015  Allergen Reaction Noted  . Augmentin [amoxicillin-pot clavulanate] Hives 06/06/2012  . Demerol [meperidine] Nausea Only 06/06/2012  . Erythromycin Hives 06/06/2012     Family History  Problem Relation Age of Onset  . Diabetes Mother   . Cancer Father     lung  . Breast cancer Maternal Grandmother   . Breast cancer Maternal Aunt     Social History   Social History  . Marital Status: Married    Spouse Name: N/A  . Number of Children: N/A  . Years of Education: N/A   Occupational History  . Not on file.   Social History Main Topics  . Smoking status: Never Smoker   . Smokeless tobacco: Never Used  . Alcohol Use: No  . Drug Use: No  . Sexual Activity: Not on file   Other Topics Concern  . Not on file   Social History Narrative    Review of Systems: See HPI, otherwise negative ROS  Physical Exam: BP 175/67 mmHg  Pulse 66  Temp(Src) 97.6 F (36.4 C) (Tympanic)  Resp 16  Ht 5\' 4"  (1.626 m)  Wt 136.079 kg (300 lb)  BMI 51.47 kg/m2  SpO2 97% General:   Alert,  pleasant and cooperative in NAD Head:  Normocephalic and atraumatic. Neck:  Supple; no masses or thyromegaly. Lungs:  Clear throughout to auscultation.    Heart:  Regular rate and rhythm. Abdomen:  Soft, nontender and nondistended. Normal bowel sounds, without guarding, and without rebound.  Obese abdomen Neurologic:  Alert and  oriented x4;  grossly normal neurologically.  Impression/Plan: Christine Robertson is here for an colonoscopy to be performed for FH colon polyps in father.  Risks, benefits, limitations, and alternatives regarding  colonoscopy have been reviewed with the patient.  Questions have been answered.  All parties agreeable.   Lynnae Prude, MD  09/09/2015, 9:45 AM

## 2015-09-09 NOTE — Anesthesia Preprocedure Evaluation (Addendum)
Anesthesia Evaluation  Patient identified by MRN, date of birth, ID band Patient awake    Reviewed: Allergy & Precautions, NPO status , Patient's Chart, lab work & pertinent test results, reviewed documented beta blocker date and time   History of Anesthesia Complications Negative for: history of anesthetic complications  Airway Mallampati: III       Dental   Pulmonary sleep apnea and Continuous Positive Airway Pressure Ventilation , COPD,  COPD inhaler,           Cardiovascular hypertension, Pt. on medications and Pt. on home beta blockers      Neuro/Psych    GI/Hepatic   Endo/Other  diabetes, Oral Hypoglycemic AgentsMorbid obesity  Renal/GU      Musculoskeletal   Abdominal   Peds  Hematology   Anesthesia Other Findings   Reproductive/Obstetrics                            Anesthesia Physical Anesthesia Plan  ASA: III  Anesthesia Plan: General   Post-op Pain Management:    Induction: Intravenous  Airway Management Planned: Nasal Cannula  Additional Equipment:   Intra-op Plan:   Post-operative Plan:   Informed Consent: I have reviewed the patients History and Physical, chart, labs and discussed the procedure including the risks, benefits and alternatives for the proposed anesthesia with the patient or authorized representative who has indicated his/her understanding and acceptance.     Plan Discussed with:   Anesthesia Plan Comments:         Anesthesia Quick Evaluation

## 2015-09-15 ENCOUNTER — Encounter: Payer: Self-pay | Admitting: Unknown Physician Specialty

## 2015-09-22 ENCOUNTER — Other Ambulatory Visit: Payer: Self-pay | Admitting: Internal Medicine

## 2015-09-23 ENCOUNTER — Encounter: Payer: Self-pay | Admitting: Internal Medicine

## 2015-10-18 ENCOUNTER — Other Ambulatory Visit: Payer: Self-pay | Admitting: Internal Medicine

## 2015-11-14 ENCOUNTER — Other Ambulatory Visit: Payer: Self-pay | Admitting: Internal Medicine

## 2015-12-14 ENCOUNTER — Other Ambulatory Visit (INDEPENDENT_AMBULATORY_CARE_PROVIDER_SITE_OTHER): Payer: 59

## 2015-12-14 DIAGNOSIS — E118 Type 2 diabetes mellitus with unspecified complications: Secondary | ICD-10-CM | POA: Diagnosis not present

## 2015-12-14 DIAGNOSIS — I10 Essential (primary) hypertension: Secondary | ICD-10-CM | POA: Diagnosis not present

## 2015-12-14 DIAGNOSIS — E7800 Pure hypercholesterolemia, unspecified: Secondary | ICD-10-CM

## 2015-12-14 LAB — BASIC METABOLIC PANEL
BUN: 19 mg/dL (ref 6–23)
CALCIUM: 9.9 mg/dL (ref 8.4–10.5)
CO2: 29 mEq/L (ref 19–32)
CREATININE: 0.71 mg/dL (ref 0.40–1.20)
Chloride: 101 mEq/L (ref 96–112)
GFR: 88.75 mL/min (ref >60.00)
GLUCOSE: 125 mg/dL — AB (ref 70–99)
POTASSIUM: 4.1 meq/L (ref 3.5–5.1)
Sodium: 140 mEq/L (ref 135–145)

## 2015-12-14 LAB — LIPID PANEL
CHOLESTEROL: 192 mg/dL (ref 0–200)
HDL: 55.7 mg/dL (ref >39.00)
LDL CALC: 102 mg/dL — AB (ref 0–99)
NonHDL: 136.2
TRIGLYCERIDES: 169 mg/dL — AB (ref 0.0–149.0)
Total CHOL/HDL Ratio: 3
VLDL: 33.8 mg/dL (ref 0.0–40.0)

## 2015-12-14 LAB — HEMOGLOBIN A1C: HEMOGLOBIN A1C: 7.7 % — AB (ref 4.6–6.5)

## 2015-12-14 LAB — HEPATIC FUNCTION PANEL
ALK PHOS: 52 U/L (ref 39–117)
ALT: 53 U/L — AB (ref 0–35)
AST: 40 U/L — AB (ref 0–37)
Albumin: 4.1 g/dL (ref 3.5–5.2)
BILIRUBIN DIRECT: 0.1 mg/dL (ref 0.0–0.3)
TOTAL PROTEIN: 7.2 g/dL (ref 6.0–8.3)
Total Bilirubin: 0.4 mg/dL (ref 0.2–1.2)

## 2015-12-15 ENCOUNTER — Other Ambulatory Visit: Payer: 59

## 2015-12-15 ENCOUNTER — Other Ambulatory Visit: Payer: Self-pay | Admitting: Internal Medicine

## 2015-12-15 ENCOUNTER — Encounter: Payer: Self-pay | Admitting: Internal Medicine

## 2015-12-15 NOTE — Telephone Encounter (Signed)
Rx refill sent to pharmacy. 

## 2015-12-19 ENCOUNTER — Ambulatory Visit: Payer: 59 | Admitting: Internal Medicine

## 2015-12-28 ENCOUNTER — Other Ambulatory Visit: Payer: Self-pay

## 2015-12-28 DIAGNOSIS — R928 Other abnormal and inconclusive findings on diagnostic imaging of breast: Secondary | ICD-10-CM

## 2016-01-11 ENCOUNTER — Telehealth: Payer: Self-pay

## 2016-01-11 NOTE — Telephone Encounter (Signed)
PA for metformin completed on Cover my meds

## 2016-01-12 NOTE — Telephone Encounter (Signed)
Thanks, re sent with [employer] insurance.

## 2016-01-12 NOTE — Telephone Encounter (Signed)
PA not completed as [employer] [facility] states that patient no longer has active coverage with them.  Please advise and ask patient if they have a new insurance card for reference other then what is in the computer here. Thanks

## 2016-01-12 NOTE — Telephone Encounter (Signed)
Pt has [employer]. I ran the info and insurance is active. Pt not longer has [employer] insurance.

## 2016-01-13 NOTE — Telephone Encounter (Signed)
PA not needed at this time for this drug per [employer].

## 2016-01-17 ENCOUNTER — Other Ambulatory Visit: Payer: Self-pay | Admitting: Internal Medicine

## 2016-03-18 ENCOUNTER — Other Ambulatory Visit: Payer: Self-pay | Admitting: Internal Medicine

## 2016-03-21 ENCOUNTER — Encounter: Payer: Self-pay | Admitting: Internal Medicine

## 2016-03-21 DIAGNOSIS — R928 Other abnormal and inconclusive findings on diagnostic imaging of breast: Secondary | ICD-10-CM

## 2016-03-22 NOTE — Telephone Encounter (Signed)
Pt sent my chart message.  States needs referral to Dr Lemar LivingsByrnett.  She has an appt for mammogram on 03/26/16 and with Dr Lemar LivingsByrnett on 04/02/16.  States needs referral in place before mammogram.  Please let her know when done.  I am going to send to both of you - not sure who would be doing the referrals.  Thanks

## 2016-03-26 ENCOUNTER — Other Ambulatory Visit: Payer: 59

## 2016-03-26 ENCOUNTER — Ambulatory Visit: Payer: 59

## 2016-03-27 ENCOUNTER — Other Ambulatory Visit: Payer: Self-pay | Admitting: General Surgery

## 2016-03-27 ENCOUNTER — Ambulatory Visit
Admission: RE | Admit: 2016-03-27 | Discharge: 2016-03-27 | Disposition: A | Discharge status: Home / Self Care | Payer: 59 | Source: Ambulatory Visit | Attending: General Surgery | Admitting: General Surgery

## 2016-03-27 DIAGNOSIS — Z1231 Encounter for screening mammogram for malignant neoplasm of breast: Secondary | ICD-10-CM

## 2016-03-30 ENCOUNTER — Encounter: Payer: Self-pay | Admitting: *Deleted

## 2016-04-02 ENCOUNTER — Encounter: Payer: Self-pay | Admitting: General Surgery

## 2016-04-02 ENCOUNTER — Ambulatory Visit (INDEPENDENT_AMBULATORY_CARE_PROVIDER_SITE_OTHER): Payer: 59 | Admitting: General Surgery

## 2016-04-02 VITALS — BP 142/80 | HR 72 | Resp 16 | Ht 64.0 in | Wt 301.0 lb

## 2016-04-02 DIAGNOSIS — R928 Other abnormal and inconclusive findings on diagnostic imaging of breast: Secondary | ICD-10-CM

## 2016-04-02 NOTE — Patient Instructions (Signed)
The patient has been asked to return to the office in one year with a bilateral diagnostic mammogram. 

## 2016-04-02 NOTE — Progress Notes (Signed)
Patient ID: Christine Robertson, female   DOB: March 30, 1956, 60 y.o.   MRN: [id number]  Chief Complaint  Patient presents with  . Follow-up    mammogram     HPI Christine Robertson is a 60 y.o. female who presents for a breast evaluation. The most recent mammogram was done on 03/28/16.  Patient does perform regular self breast checks and gets regular mammograms done.   The patient's daughter, a missionary in [place] and her husband are anticipating adoption of the child from [place] in the near future.  HPI  Past Medical History:  Diagnosis Date  . Abnormal results of thyroid function studies   . Allergy    hay fever  . Angina pectoris, unspecified (HCC)   . Arthritis   . Atrial fibrillation, unspecified   . Chicken pox   . Cold hands   . Congenital anomaly of adrenal gland   . Diabetes mellitus    type 2, uncomplicated  . Essential hypertension, benign   . GERD (gastroesophageal reflux disease)   . IBS (irritable bowel syndrome)   . Malaise   . Morbid obesity (HCC)   . Murmur   . Pain in soft tissues of limb   . Proteinuria   . Pure hypercholesterolemia   . Rash and other nonspecific skin eruption   . Sleep apnea    Obstructive; Uses C-Pap machine  . Urinary incontinence     Past Surgical History:  Procedure Laterality Date  . ABDOMINAL HYSTERECTOMY  2010  . BLADDER SURGERY  1967   stretching of bladder  . BREAST BIOPSY Left 03/28/2015   done at Dr. Rutherford Nail office  . COLONOSCOPY    . COLONOSCOPY WITH PROPOFOL N/A 09/09/2015   Procedure: COLONOSCOPY WITH PROPOFOL;  Surgeon: Scot Jun, MD;  Location: [facility] ENDOSCOPY;  Service: Endoscopy;  Laterality: N/A;  . ESOPHAGOGASTRODUODENOSCOPY    . TONSILLECTOMY AND ADENOIDECTOMY  1959  . URETHRAL DILATION      Family History  Problem Relation Age of Onset  . Diabetes Mother   . Cancer Father     lung  . Breast cancer Maternal Grandmother   . Breast cancer Maternal Aunt     Social History Social History  Substance  Use Topics  . Smoking status: Never Smoker  . Smokeless tobacco: Never Used  . Alcohol use No    Allergies  Allergen Reactions  . Augmentin [Amoxicillin-Pot Clavulanate] Hives  . Demerol [Meperidine] Nausea Only  . Erythromycin Hives    Current Outpatient Prescriptions  Medication Sig Dispense Refill  . aspirin 81 MG tablet Take 81 mg by mouth daily.    . cetirizine (ZYRTEC) 10 MG tablet Take 10 mg by mouth daily.    . clotrimazole-betamethasone (LOTRISONE) cream Apply 1 application topically 2 (two) times daily. 30 g 0  . fluticasone (FLOVENT HFA) 110 MCG/ACT inhaler Inhale 600 puffs into the lungs 2 (two) times daily as needed.     Marland Kitchen glimepiride (AMARYL) 2 MG tablet TAKE 1/2 TABLET(1 MG) BY MOUTH DAILY 30 tablet 6  . hydrochlorothiazide (HYDRODIURIL) 25 MG tablet TAKE 1 TABLET BY MOUTH EVERY DAY 90 tablet 1  . lisinopril (PRINIVIL,ZESTRIL) 5 MG tablet Take 5 mg by mouth daily.    . metFORMIN (GLUCOPHAGE) 1000 MG tablet TAKE 1 TABLET BY MOUTH TWICE DAILY WITH MEALS 60 tablet 9  . metoprolol tartrate (LOPRESSOR) 25 MG tablet Take 25 mg by mouth 2 (two) times daily.    . Multiple Vitamin (MULTIVITAMIN) tablet Take 1 tablet  by mouth daily.    Marland Kitchen omeprazole (PRILOSEC) 20 MG capsule TAKE 1 CAPSULE BY MOUTH TWICE DAILY 60 capsule 11  . oxybutynin (DITROPAN) 5 MG tablet TAKE 1 TABLET BY MOUTH TWICE DAILY 180 tablet 0  . Probiotic Product (PROBIOTIC PO) Take by mouth.    . sitaGLIPtin (JANUVIA) 100 MG tablet Take 100 mg by mouth daily.    . TRADJENTA 5 MG TABS tablet TAKE 1 TABLET(5 MG) BY MOUTH DAILY 30 tablet 6   No current facility-administered medications for this visit.     Review of Systems Review of Systems  Constitutional: Negative.   Respiratory: Negative.   Cardiovascular: Negative.     Blood pressure (!) 142/80, pulse 72, resp. rate 16, height 5\' 4"  (1.626 m), weight (!) 301 lb (136.5 kg).  Physical Exam Physical Exam  Constitutional: She is oriented to person, place,  and time. She appears well-developed and well-nourished.  Eyes: Conjunctivae are normal. No scleral icterus.  Neck: Neck supple.  Cardiovascular: Normal rate, regular rhythm and normal heart sounds.   Pulmonary/Chest: Effort normal and breath sounds normal. Right breast exhibits no inverted nipple, no mass, no nipple discharge, no skin change and no tenderness. Left breast exhibits no inverted nipple, no mass, no nipple discharge, no skin change and no tenderness.  Lymphadenopathy:    She has no cervical adenopathy.    She has no axillary adenopathy.  Neurological: She is alert and oriented to person, place, and time.  Skin: Skin is warm and dry.    Data Reviewed  03/28/2015 left breast biopsy ultrasound guided core biopsy:  Breast, left, needle core biopsy, 2 o'clock - FIBROCYSTIC CHANGES. - NO EVIDENCE OF MALIGNANCY. 05/16/2015 stereotactic biopsy: DIAGNOSIS:  A. BREAST, LEFT 3:00; BIOPSY:  - COLLAPSED CYSTIC PAPILLARY APOCRINE METAPLASIA, COLUMNAR CELL CHANGE,  AND USUAL DUCTAL HYPERPLASIA.  - NEGATIVE FOR ATYPIA AND MALIGNANCY.  Bilateral mammograms dated 03/27/2016 showed no new areas of increased suspicion. BI-RADS-2.  Assessment    Benign breast exam.    Plan    Patient should resume annual screening mammograms with her primary care physician.  She was encouraged to call should have any concerns regarding her breasts.   Patient to return as needed.   This information has been scribed by Ples Specter CMA.'   Earline Mayotte 04/03/2016, 5:38 AM

## 2016-04-09 ENCOUNTER — Encounter: Payer: Self-pay | Admitting: Internal Medicine

## 2016-04-18 ENCOUNTER — Other Ambulatory Visit: Payer: Self-pay | Admitting: Internal Medicine

## 2016-05-16 ENCOUNTER — Other Ambulatory Visit: Payer: Self-pay | Admitting: Internal Medicine

## 2016-06-11 ENCOUNTER — Other Ambulatory Visit: Payer: Self-pay | Admitting: Internal Medicine

## 2016-07-09 ENCOUNTER — Other Ambulatory Visit: Payer: Self-pay | Admitting: Internal Medicine

## 2016-07-15 ENCOUNTER — Other Ambulatory Visit: Payer: Self-pay | Admitting: Internal Medicine

## 2016-07-16 ENCOUNTER — Ambulatory Visit (INDEPENDENT_AMBULATORY_CARE_PROVIDER_SITE_OTHER): Payer: 59 | Admitting: Surgical

## 2016-07-16 DIAGNOSIS — Z23 Encounter for immunization: Secondary | ICD-10-CM

## 2016-08-09 ENCOUNTER — Other Ambulatory Visit: Payer: Self-pay | Admitting: Internal Medicine

## 2016-08-13 ENCOUNTER — Other Ambulatory Visit: Payer: Self-pay | Admitting: Internal Medicine

## 2016-08-14 LAB — HM DIABETES EYE EXAM

## 2016-08-15 ENCOUNTER — Encounter: Payer: Self-pay | Admitting: Internal Medicine

## 2016-08-31 ENCOUNTER — Ambulatory Visit (INDEPENDENT_AMBULATORY_CARE_PROVIDER_SITE_OTHER): Payer: 59 | Admitting: Internal Medicine

## 2016-08-31 ENCOUNTER — Encounter: Payer: Self-pay | Admitting: Internal Medicine

## 2016-08-31 VITALS — BP 142/86 | HR 69 | Temp 97.8°F | Ht 64.0 in | Wt 299.2 lb

## 2016-08-31 DIAGNOSIS — Z1159 Encounter for screening for other viral diseases: Secondary | ICD-10-CM

## 2016-08-31 DIAGNOSIS — R350 Frequency of micturition: Secondary | ICD-10-CM

## 2016-08-31 DIAGNOSIS — Z23 Encounter for immunization: Secondary | ICD-10-CM | POA: Diagnosis not present

## 2016-08-31 DIAGNOSIS — R7989 Other specified abnormal findings of blood chemistry: Secondary | ICD-10-CM

## 2016-08-31 DIAGNOSIS — E7800 Pure hypercholesterolemia, unspecified: Secondary | ICD-10-CM | POA: Diagnosis not present

## 2016-08-31 DIAGNOSIS — I10 Essential (primary) hypertension: Secondary | ICD-10-CM | POA: Diagnosis not present

## 2016-08-31 DIAGNOSIS — K219 Gastro-esophageal reflux disease without esophagitis: Secondary | ICD-10-CM

## 2016-08-31 DIAGNOSIS — R946 Abnormal results of thyroid function studies: Secondary | ICD-10-CM

## 2016-08-31 DIAGNOSIS — G4733 Obstructive sleep apnea (adult) (pediatric): Secondary | ICD-10-CM

## 2016-08-31 DIAGNOSIS — E118 Type 2 diabetes mellitus with unspecified complications: Secondary | ICD-10-CM | POA: Diagnosis not present

## 2016-08-31 DIAGNOSIS — R809 Proteinuria, unspecified: Secondary | ICD-10-CM

## 2016-08-31 DIAGNOSIS — I4891 Unspecified atrial fibrillation: Secondary | ICD-10-CM

## 2016-08-31 LAB — URINALYSIS, ROUTINE W REFLEX MICROSCOPIC
BILIRUBIN URINE: NEGATIVE
Hgb urine dipstick: NEGATIVE
Ketones, ur: NEGATIVE
LEUKOCYTES UA: NEGATIVE
Nitrite: NEGATIVE
PH: 6 (ref 5.0–8.0)
RBC / HPF: NONE SEEN (ref 0–?)
Specific Gravity, Urine: 1.015 (ref 1.000–1.030)
TOTAL PROTEIN, URINE-UPE24: NEGATIVE
Urine Glucose: NEGATIVE
Urobilinogen, UA: 0.2 (ref 0.0–1.0)
WBC, UA: NONE SEEN (ref 0–?)

## 2016-08-31 LAB — MICROALBUMIN / CREATININE URINE RATIO
Creatinine,U: 78.6 mg/dL
MICROALB/CREAT RATIO: 1.9 mg/g (ref 0.0–30.0)
Microalb, Ur: 1.5 mg/dL (ref 0.0–1.9)

## 2016-08-31 LAB — HEPATIC FUNCTION PANEL
ALK PHOS: 57 U/L (ref 39–117)
ALT: 48 U/L — ABNORMAL HIGH (ref 0–35)
AST: 32 U/L (ref 0–37)
Albumin: 4.3 g/dL (ref 3.5–5.2)
BILIRUBIN DIRECT: 0.1 mg/dL (ref 0.0–0.3)
BILIRUBIN TOTAL: 0.5 mg/dL (ref 0.2–1.2)
Total Protein: 6.7 g/dL (ref 6.0–8.3)

## 2016-08-31 LAB — CBC WITH DIFFERENTIAL/PLATELET
BASOS ABS: 0 10*3/uL (ref 0.0–0.1)
Basophils Relative: 0.5 % (ref 0.0–3.0)
EOS ABS: 0.4 10*3/uL (ref 0.0–0.7)
Eosinophils Relative: 3.9 % (ref 0.0–5.0)
HEMATOCRIT: 37.7 % (ref 36.0–46.0)
HEMOGLOBIN: 12.9 g/dL (ref 12.0–15.0)
LYMPHS PCT: 22.7 % (ref 12.0–46.0)
Lymphs Abs: 2.1 10*3/uL (ref 0.7–4.0)
MCHC: 34.2 g/dL (ref 30.0–36.0)
MCV: 87 fl (ref 78.0–100.0)
Monocytes Absolute: 0.5 10*3/uL (ref 0.1–1.0)
Monocytes Relative: 5.2 % (ref 3.0–12.0)
Neutro Abs: 6.2 10*3/uL (ref 1.4–7.7)
Neutrophils Relative %: 67.7 % (ref 43.0–77.0)
PLATELETS: 248 10*3/uL (ref 150.0–400.0)
RBC: 4.33 Mil/uL (ref 3.87–5.11)
RDW: 13.4 % (ref 11.5–15.5)
WBC: 9.2 10*3/uL (ref 4.0–10.5)

## 2016-08-31 LAB — LIPID PANEL
CHOLESTEROL: 189 mg/dL (ref 0–200)
HDL: 57.2 mg/dL (ref >39.00)
LDL CALC: 96 mg/dL (ref 0–99)
NonHDL: 132.29
Total CHOL/HDL Ratio: 3
Triglycerides: 182 mg/dL — ABNORMAL HIGH (ref 0.0–149.0)
VLDL: 36.4 mg/dL (ref 0.0–40.0)

## 2016-08-31 LAB — HM DIABETES FOOT EXAM

## 2016-08-31 LAB — BASIC METABOLIC PANEL
BUN: 18 mg/dL (ref 6–23)
CALCIUM: 9.6 mg/dL (ref 8.4–10.5)
CO2: 30 mEq/L (ref 19–32)
Chloride: 101 mEq/L (ref 96–112)
Creatinine, Ser: 0.65 mg/dL (ref 0.40–1.20)
GFR: 98.04 mL/min (ref >60.00)
GLUCOSE: 155 mg/dL — AB (ref 70–99)
Potassium: 4.2 mEq/L (ref 3.5–5.1)
SODIUM: 141 meq/L (ref 135–145)

## 2016-08-31 LAB — TSH: TSH: 0.68 u[IU]/mL (ref 0.35–4.50)

## 2016-08-31 LAB — HEMOGLOBIN A1C: HEMOGLOBIN A1C: 7.5 % — AB (ref 4.6–6.5)

## 2016-08-31 MED ORDER — LISINOPRIL 10 MG PO TABS: 10.0000 mg | ORAL_TABLET | Freq: Every day | ORAL | 3 refills | Status: DC

## 2016-08-31 NOTE — Progress Notes (Signed)
Patient ID: Christine Robertson, female   DOB: 11-29-1955, 60 y.o.   MRN: [id number]   Subjective:    Patient ID: Christine Robertson, female    DOB: [birth year], 60 y.o.   MRN: [id number]  HPI  Patient here for a scheduled follow up.  She was evaluated recently by cardiology for increased palpitations and sob with exertion.  Note reviewed.  Planning for echo in 09/2016.  Reports palpitations are better.  Still notices some sob with exertion.  Was questioning deconditioning and weight.  No chest pain.  No acid reflux.  No abdominal pain.  Bowels stable.  Blood sugars averaging 160s in the am and 135-140 in the pm.  Discussed diet and exercise.  She plans to start exercising more after cardiac evaluation complete.  She is being cut to part time in a few months.  Will lose her insurance then.  Handling stress.     Past Medical History:  Diagnosis Date  . Abnormal results of thyroid function studies   . Allergy    hay fever  . Angina pectoris, unspecified   . Arthritis   . Atrial fibrillation, unspecified   . Chicken pox   . Cold hands   . Congenital anomaly of adrenal gland   . Diabetes mellitus    type 2, uncomplicated  . Essential hypertension, benign   . GERD (gastroesophageal reflux disease)   . IBS (irritable bowel syndrome)   . Malaise   . Morbid obesity ([place])   . Murmur   . Pain in soft tissues of limb   . Proteinuria   . Pure hypercholesterolemia   . Rash and other nonspecific skin eruption   . Sleep apnea    Obstructive; Uses C-Pap machine  . Urinary incontinence    Past Surgical History:  Procedure Laterality Date  . ABDOMINAL HYSTERECTOMY  2010  . BLADDER SURGERY  1967   stretching of bladder  . BREAST BIOPSY Left 03/28/2015   done at Dr. Dwyane Luo office  . COLONOSCOPY    . COLONOSCOPY WITH PROPOFOL N/A 09/09/2015   Procedure: COLONOSCOPY WITH PROPOFOL;  Surgeon: Manya Silvas, MD;  Location: [facility] ENDOSCOPY;  Service: Endoscopy;  Laterality: N/A;  .  ESOPHAGOGASTRODUODENOSCOPY    . TONSILLECTOMY AND ADENOIDECTOMY  1959  . URETHRAL DILATION     Family History  Problem Relation Age of Onset  . Diabetes Mother   . Cancer Father     lung  . Breast cancer Maternal Grandmother   . Breast cancer Maternal Aunt    Social History   Social History  . Marital status: Married    Spouse name: N/A  . Number of children: N/A  . Years of education: N/A   Social History Main Topics  . Smoking status: Never Smoker  . Smokeless tobacco: Never Used  . Alcohol use No  . Drug use: No  . Sexual activity: Not Asked   Other Topics Concern  . None   Social History Narrative  . None    Outpatient Encounter Prescriptions as of 08/31/2016  Medication Sig  . aspirin 81 MG tablet Take 81 mg by mouth daily.  . cetirizine (ZYRTEC) 10 MG tablet Take 10 mg by mouth daily.  . clotrimazole-betamethasone (LOTRISONE) cream Apply 1 application topically 2 (two) times daily.  Marland Kitchen glimepiride (AMARYL) 2 MG tablet TAKE 1/2 TABLET(1 MG) BY MOUTH DAILY  . hydrochlorothiazide (HYDRODIURIL) 25 MG tablet TAKE 1 TABLET BY MOUTH EVERY DAY  . linagliptin (TRADJENTA) 5 MG TABS tablet  Take 1 tablet (5 mg total) by mouth daily. MUST KEEP SCHEDULED APPT  . metFORMIN (GLUCOPHAGE) 1000 MG tablet TAKE 1 TABLET BY MOUTH TWICE DAILY WITH MEALS  . metoprolol tartrate (LOPRESSOR) 25 MG tablet Take 25 mg by mouth 2 (two) times daily.  . Multiple Vitamin (MULTIVITAMIN) tablet Take 1 tablet by mouth daily.  Marland Kitchen omeprazole (PRILOSEC) 20 MG capsule TAKE 1 CAPSULE BY MOUTH TWICE DAILY  . oxybutynin (DITROPAN) 5 MG tablet TAKE 1 TABLET BY MOUTH TWICE DAILY  . [DISCONTINUED] lisinopril (PRINIVIL,ZESTRIL) 5 MG tablet Take 5 mg by mouth daily.  . [DISCONTINUED] Probiotic Product (PROBIOTIC PO) Take by mouth.  . [DISCONTINUED] sitaGLIPtin (JANUVIA) 100 MG tablet Take 100 mg by mouth daily.  Marland Kitchen lisinopril (PRINIVIL,ZESTRIL) 10 MG tablet Take 1 tablet (10 mg total) by mouth daily.  .  [DISCONTINUED] fluticasone (FLOVENT HFA) 110 MCG/ACT inhaler Inhale 600 puffs into the lungs 2 (two) times daily as needed.    No facility-administered encounter medications on file as of 08/31/2016.     Review of Systems  Constitutional: Negative for appetite change and unexpected weight change.  HENT: Negative for congestion and sinus pressure.   Respiratory: Positive for shortness of breath. Negative for cough and chest tightness.   Cardiovascular: Positive for palpitations. Negative for chest pain and leg swelling.  Gastrointestinal: Negative for abdominal pain, diarrhea, nausea and vomiting.  Genitourinary: Negative for difficulty urinating and dysuria.  Musculoskeletal: Negative for joint swelling and myalgias.  Skin: Negative for color change and rash.  Neurological: Negative for dizziness, light-headedness and headaches.  Psychiatric/Behavioral: Negative for agitation and dysphoric mood.       Objective:    Physical Exam  Constitutional: She appears well-developed and well-nourished. No distress.  HENT:  Nose: Nose normal.  Mouth/Throat: Oropharynx is clear and moist.  Neck: Neck supple. No thyromegaly present.  Cardiovascular: Normal rate and regular rhythm.   Pulmonary/Chest: Breath sounds normal. No respiratory distress. She has no wheezes.  Abdominal: Soft. Bowel sounds are normal. There is no tenderness.  Musculoskeletal: She exhibits no tenderness.  No increased edema.    Lymphadenopathy:    She has no cervical adenopathy.  Skin: No rash noted. No erythema.  Psychiatric: She has a normal mood and affect. Her behavior is normal.    BP (!) 142/86   Pulse 69   Temp 97.8 F (36.6 C) (Oral)   Ht '5\' 4"'$  (1.626 m)   Wt 299 lb 3.2 oz (135.7 kg)   SpO2 93%   BMI 51.36 kg/m  Wt Readings from Last 3 Encounters:  08/31/16 299 lb 3.2 oz (135.7 kg)  04/02/16 (!) 301 lb (136.5 kg)  09/09/15 300 lb (136.1 kg)     Lab Results  Component Value Date   WBC 9.2  08/31/2016   HGB 12.9 08/31/2016   HCT 37.7 08/31/2016   PLT 248.0 08/31/2016   GLUCOSE 155 (H) 08/31/2016   CHOL 189 08/31/2016   TRIG 182.0 (H) 08/31/2016   HDL 57.20 08/31/2016   LDLDIRECT 101.0 04/19/2015   LDLCALC 96 08/31/2016   ALT 48 (H) 08/31/2016   AST 32 08/31/2016   NA 141 08/31/2016   K 4.2 08/31/2016   CL 101 08/31/2016   CREATININE 0.65 08/31/2016   BUN 18 08/31/2016   CO2 30 08/31/2016   TSH 0.68 08/31/2016   INR 0.99 11/15/2012   HGBA1C 7.5 (H) 08/31/2016   MICROALBUR 1.5 08/31/2016    Mm Screening Breast Tomo Bilateral  Result Date: 03/28/2016 CLINICAL DATA:  Screening. History of benign left breast biopsy. EXAM: 2D DIGITAL SCREENING BILATERAL MAMMOGRAM WITH CAD AND ADJUNCT TOMO COMPARISON:  Previous exam(s). ACR Breast Density Category b: There are scattered areas of fibroglandular density. FINDINGS: Biopsy site markers within the left breast are stable in position. There are no findings suspicious for malignancy within either breast. Images were processed with CAD. IMPRESSION: No mammographic evidence of malignancy. A result letter of this screening mammogram will be mailed directly to the patient. RECOMMENDATION: Screening mammogram in one year. (Code:SM-B-01Y) BI-RADS CATEGORY  2: Benign. Electronically Signed   By: Franki Cabot M.D.   On: 03/28/2016 16:13      Assessment & Plan:   Problem List Items Addressed This Visit    Atrial fibrillation ([place])    In SR.  Seeing cardiology.  W/up in progress.        Relevant Medications   lisinopril (PRINIVIL,ZESTRIL) 10 MG tablet   Diabetes mellitus (HCC)    Low carb diet and exercise.  Follow met b and a1c.  Up to date with eye checks.  Hold on making changes in her medication.        Relevant Medications   lisinopril (PRINIVIL,ZESTRIL) 10 MG tablet   Other Relevant Orders   Hemoglobin A1c (Completed)   Microalbumin / creatinine urine ratio (Completed)   GERD (gastroesophageal reflux disease)     Controlled on omeprazole.        Hypercholesteremia    Low cholesterol diet and exercise.  Follow lipid panel.        Relevant Medications   lisinopril (PRINIVIL,ZESTRIL) 10 MG tablet   Other Relevant Orders   Hepatic function panel (Completed)   Lipid panel (Completed)   Hypertension - Primary    Blood pressure slightly increased.  Increase lisinopril to '10mg'$  q day.  Follow pressures.  Follow metabolic panel.       Relevant Medications   lisinopril (PRINIVIL,ZESTRIL) 10 MG tablet   Other Relevant Orders   CBC with Differential/Platelet (Completed)   Basic metabolic panel (Completed)   Low TSH level   Relevant Orders   TSH (Completed)   Microalbuminuria    On lisinopril.  With blood pressure slightly increased, will increase lisinopril to '10mg'$  q day.  Follow pressures.  Follow metabolic panel.       Obstructive sleep apnea    CPAP.       Severe obesity (BMI >= 40) (HCC)    Diet and exercise.  Follow.         Other Visit Diagnoses    Urinary frequency       Relevant Orders   Urinalysis, Routine w reflex microscopic (Completed)   CULTURE, URINE COMPREHENSIVE   Need for hepatitis C screening test       Relevant Orders   Hepatitis C antibody (Completed)   Need for prophylactic vaccination against Streptococcus pneumoniae (pneumococcus)       Relevant Orders   Pneumococcal polysaccharide vaccine 23-valent greater than or equal to 2yo subcutaneous/IM (Completed)       Einar Pheasant, MD

## 2016-09-01 LAB — HEPATITIS C ANTIBODY: HCV AB: NEGATIVE

## 2016-09-03 ENCOUNTER — Telehealth: Payer: Self-pay | Admitting: Internal Medicine

## 2016-09-03 ENCOUNTER — Encounter: Payer: Self-pay | Admitting: Internal Medicine

## 2016-09-03 LAB — CULTURE, URINE COMPREHENSIVE

## 2016-09-03 NOTE — Assessment & Plan Note (Addendum)
Low carb diet and exercise.  Follow met b and a1c.  Up to date with eye checks.  Hold on making changes in her medication.

## 2016-09-03 NOTE — Assessment & Plan Note (Signed)
Blood pressure slightly increased.  Increase lisinopril to 10mg  q day.  Follow pressures.  Follow metabolic panel.

## 2016-09-03 NOTE — Assessment & Plan Note (Signed)
On lisinopril.  With blood pressure slightly increased, will increase lisinopril to 10mg  q day.  Follow pressures.  Follow metabolic panel.

## 2016-09-03 NOTE — Assessment & Plan Note (Signed)
Controlled on omeprazole.   

## 2016-09-03 NOTE — Assessment & Plan Note (Signed)
CPAP.  

## 2016-09-03 NOTE — Assessment & Plan Note (Signed)
Low cholesterol diet and exercise.  Follow lipid panel.   

## 2016-09-03 NOTE — Telephone Encounter (Signed)
Sent message regarding scheduling mammogram.

## 2016-09-03 NOTE — Assessment & Plan Note (Signed)
Diet and exercise.  Follow.  

## 2016-09-03 NOTE — Assessment & Plan Note (Signed)
In SR.  Seeing cardiology.  W/up in progress.

## 2016-09-07 ENCOUNTER — Other Ambulatory Visit: Payer: Self-pay | Admitting: Internal Medicine

## 2016-09-10 ENCOUNTER — Encounter: Payer: Self-pay | Admitting: Internal Medicine

## 2016-09-10 NOTE — Telephone Encounter (Signed)
Please call pt.  We can send in rx for pulmicort (this is an inhaler she has been on previously that is a steroid inhaler).  This is not the albuterol/proventil.  I do feel, if she is having trouble breathing, she needs to be seen.  I would recommend appt for evaluation.

## 2016-09-11 ENCOUNTER — Other Ambulatory Visit: Payer: Self-pay | Admitting: Internal Medicine

## 2016-09-13 ENCOUNTER — Other Ambulatory Visit: Payer: Self-pay | Admitting: Surgical

## 2016-09-13 MED ORDER — BUDESONIDE 180 MCG/ACT IN AEPB: 2.0000 | INHALATION_SPRAY | Freq: Two times a day (BID) | RESPIRATORY_TRACT | 1 refills | Status: DC

## 2016-09-14 MED ORDER — BECLOMETHASONE DIPROPIONATE 40 MCG/ACT IN AERS: 1.0000 | INHALATION_SPRAY | Freq: Two times a day (BID) | RESPIRATORY_TRACT | 1 refills | Status: DC

## 2016-09-14 NOTE — Telephone Encounter (Signed)
Noted.  Thank you.  Let me know if I need to do anything.  

## 2016-09-14 NOTE — Telephone Encounter (Signed)
PT called back with the names of the medication that the insurance will cover are asmanex or qvar. Please advise, thank you!

## 2016-09-14 NOTE — Telephone Encounter (Signed)
rx sent in for qvar inhaler.

## 2016-09-14 NOTE — Telephone Encounter (Signed)
Pa completed on covermymeds may have to try Qvar first.  Awaiting approval.

## 2016-10-31 ENCOUNTER — Encounter: Payer: Self-pay | Admitting: Internal Medicine

## 2016-11-01 NOTE — Telephone Encounter (Signed)
Please see Ms Christine Robertson my chart message.  Regarding [facility] 4$ list, she is already taking amaryl and metformin.  I am not aware of any other diabetic medication on the 4$ list.  Can you help?  See if any other medication insurance would cover in place of her tradjenta.  Please notify pt and let me know if I need to do anything.   Thanks

## 2016-11-05 NOTE — Telephone Encounter (Signed)
See my previous message.  Can you help with this.  Is there anything that will be cheaper for pt?

## 2016-11-05 NOTE — Telephone Encounter (Signed)
Pt called back to follow up on the email from 10/31/16. Thank you!  Call pt @ [phone]

## 2016-11-07 ENCOUNTER — Ambulatory Visit: Payer: 59 | Admitting: Internal Medicine

## 2016-11-09 NOTE — Telephone Encounter (Signed)
Pt would like for you to call back at [phone]

## 2016-11-09 NOTE — Telephone Encounter (Signed)
Pt called back returning your call. Thank you!  Call pt @ [phone]

## 2016-11-15 ENCOUNTER — Telehealth: Payer: Self-pay | Admitting: *Deleted

## 2016-11-15 NOTE — Telephone Encounter (Signed)
Patient requested a call to discuss a cheaper medication for her diabetes  Pt contact [phone]

## 2016-11-16 ENCOUNTER — Encounter: Payer: Self-pay | Admitting: Internal Medicine

## 2016-11-16 MED ORDER — LINAGLIPTIN 5 MG PO TABS: 5.0000 mg | ORAL_TABLET | Freq: Every day | ORAL | 3 refills | Status: DC

## 2016-11-16 NOTE — Telephone Encounter (Signed)
Pt informed put at reception will pick up Monday.

## 2016-11-16 NOTE — Telephone Encounter (Signed)
Christine Robertson, this pt cannot afford tradjenta.  Can you please contact her.  There is no other 4$ medication listed.  She is already on the generic medications.  She will need to have MD visits and medication.  [facility] is [facility] and Christine Robertson (in town) that helps with medication.  Also, can refer [facility].

## 2016-11-16 NOTE — Telephone Encounter (Signed)
Spoke to patient and Christine MessierKathy have a discount card but will need new script for Tradjenta for her to pick up and take with her to get for discount. I also am going to give information for the [facility] assistance and Leonette MostCharles drew. Will call patient when all is ready for pick up. Informed it would be Monday.

## 2016-11-16 NOTE — Telephone Encounter (Signed)
rx printed for tradjenta rx.

## 2016-11-19 NOTE — Telephone Encounter (Signed)
See phone note from 11/15/16

## 2016-11-19 NOTE — Telephone Encounter (Signed)
Sorry, Dr. Lorin PicketScott there is 4 different messages going for this patient and the last message we gave patient a Trajenta card for one year at $10 per month .  Patient is to pick up today,  Per conversation on Friday.

## 2016-11-27 ENCOUNTER — Encounter: Payer: Self-pay | Admitting: Internal Medicine

## 2016-11-29 ENCOUNTER — Telehealth: Payer: Self-pay | Admitting: Internal Medicine

## 2016-11-29 NOTE — Telephone Encounter (Signed)
Pt called requesting refills on her oxybutynin (DITROPAN) 5 MG tablet and glimepiride (AMARYL) 2 MG tablet. Pt has switched pharamcies. Please advise, thank you!  Pharmacy - [facility] [address] - [place], [place] [address]  Call pt @ [phone]

## 2016-12-02 NOTE — Telephone Encounter (Signed)
Duplicate. See other message.

## 2016-12-03 MED ORDER — GLIMEPIRIDE 2 MG PO TABS: ORAL_TABLET | ORAL | 3 refills | Status: DC

## 2016-12-03 MED ORDER — OXYBUTYNIN CHLORIDE 5 MG PO TABS: 5.0000 mg | ORAL_TABLET | Freq: Two times a day (BID) | ORAL | 0 refills | Status: DC

## 2016-12-03 NOTE — Telephone Encounter (Signed)
rx sent in for amaryl and ditropan as requested.

## 2016-12-22 ENCOUNTER — Other Ambulatory Visit: Payer: Self-pay | Admitting: Internal Medicine

## 2016-12-24 ENCOUNTER — Ambulatory Visit (INDEPENDENT_AMBULATORY_CARE_PROVIDER_SITE_OTHER): Payer: 59 | Admitting: Internal Medicine

## 2016-12-24 ENCOUNTER — Encounter: Payer: Self-pay | Admitting: Internal Medicine

## 2016-12-24 ENCOUNTER — Other Ambulatory Visit (HOSPITAL_COMMUNITY)
Admission: RE | Admit: 2016-12-24 | Discharge: 2016-12-24 | Disposition: A | Discharge status: Home / Self Care | Payer: 59 | Source: Ambulatory Visit | Attending: Internal Medicine | Admitting: Internal Medicine

## 2016-12-24 VITALS — BP 134/86 | HR 60 | Temp 97.7°F | Resp 18 | Ht 63.0 in | Wt 301.5 lb

## 2016-12-24 DIAGNOSIS — E118 Type 2 diabetes mellitus with unspecified complications: Secondary | ICD-10-CM | POA: Diagnosis not present

## 2016-12-24 DIAGNOSIS — I10 Essential (primary) hypertension: Secondary | ICD-10-CM

## 2016-12-24 DIAGNOSIS — M79601 Pain in right arm: Secondary | ICD-10-CM | POA: Diagnosis not present

## 2016-12-24 DIAGNOSIS — E7800 Pure hypercholesterolemia, unspecified: Secondary | ICD-10-CM

## 2016-12-24 DIAGNOSIS — Z124 Encounter for screening for malignant neoplasm of cervix: Secondary | ICD-10-CM

## 2016-12-24 DIAGNOSIS — K219 Gastro-esophageal reflux disease without esophagitis: Secondary | ICD-10-CM

## 2016-12-24 DIAGNOSIS — Z0000 Encounter for general adult medical examination without abnormal findings: Secondary | ICD-10-CM | POA: Diagnosis not present

## 2016-12-24 DIAGNOSIS — M79602 Pain in left arm: Secondary | ICD-10-CM

## 2016-12-24 DIAGNOSIS — R079 Chest pain, unspecified: Secondary | ICD-10-CM | POA: Diagnosis not present

## 2016-12-24 DIAGNOSIS — I4891 Unspecified atrial fibrillation: Secondary | ICD-10-CM | POA: Diagnosis not present

## 2016-12-24 DIAGNOSIS — R32 Unspecified urinary incontinence: Secondary | ICD-10-CM

## 2016-12-24 DIAGNOSIS — G4733 Obstructive sleep apnea (adult) (pediatric): Secondary | ICD-10-CM

## 2016-12-24 LAB — HEPATIC FUNCTION PANEL
ALK PHOS: 61 U/L (ref 39–117)
ALT: 34 U/L (ref 0–35)
AST: 25 U/L (ref 0–37)
Albumin: 4.2 g/dL (ref 3.5–5.2)
BILIRUBIN DIRECT: 0.1 mg/dL (ref 0.0–0.3)
BILIRUBIN TOTAL: 0.4 mg/dL (ref 0.2–1.2)
Total Protein: 7 g/dL (ref 6.0–8.3)

## 2016-12-24 LAB — BASIC METABOLIC PANEL
BUN: 17 mg/dL (ref 6–23)
CO2: 30 mEq/L (ref 19–32)
CREATININE: 0.7 mg/dL (ref 0.40–1.20)
Calcium: 10.4 mg/dL (ref 8.4–10.5)
Chloride: 101 mEq/L (ref 96–112)
GFR: 89.91 mL/min (ref >60.00)
Glucose, Bld: 127 mg/dL — ABNORMAL HIGH (ref 70–99)
Potassium: 4.3 mEq/L (ref 3.5–5.1)
SODIUM: 139 meq/L (ref 135–145)

## 2016-12-24 LAB — LIPID PANEL
CHOL/HDL RATIO: 3
Cholesterol: 168 mg/dL (ref 0–200)
HDL: 57.5 mg/dL (ref >39.00)
LDL CALC: 84 mg/dL (ref 0–99)
NONHDL: 110.22
Triglycerides: 133 mg/dL (ref 0.0–149.0)
VLDL: 26.6 mg/dL (ref 0.0–40.0)

## 2016-12-24 LAB — HEMOGLOBIN A1C: HEMOGLOBIN A1C: 7.8 % — AB (ref 4.6–6.5)

## 2016-12-24 LAB — GLUCOSE, POCT (MANUAL RESULT ENTRY): POC Glucose: 131 mg/dl — AB (ref 70–99)

## 2016-12-24 LAB — SEDIMENTATION RATE: Sed Rate: 29 mm/hr (ref 0–30)

## 2016-12-24 MED ORDER — PANTOPRAZOLE SODIUM 40 MG PO TBEC: 40.0000 mg | DELAYED_RELEASE_TABLET | Freq: Every day | ORAL | 2 refills | Status: DC

## 2016-12-24 NOTE — Assessment & Plan Note (Signed)
Physical today 12/24/16.  Mammogram 03/28/16 - Birads II.  Colonoscopy 2013.

## 2016-12-24 NOTE — Progress Notes (Signed)
Pre visit review using our clinic review tool, if applicable. No additional management support is needed unless otherwise documented below in the visit note. 

## 2016-12-24 NOTE — Progress Notes (Signed)
Patient ID: Christine Robertson, female   DOB: 1955/12/18, 61 y.o.   MRN: [id number]   Subjective:    Patient ID: Christine Robertson, female    DOB: 03-19-1956, 61 y.o.   MRN: [id number]  HPI  Patient here for her physical exam.  Has a history of diabetes and paroxysmal afib.  Saw cardiology 09/07/16.  Remains on flecainide and metoprolol and has been tolerating these medications.  They felt things were stable.  No changes made.  She reports her sugars have been averaging 150-160s in the am and pm sugars averaging 113-150.  She has had issues with insurance coverage for her medication.  Off tradjenta.  Started amaryl recently.  Reports upper arm soreness.  Some chest discomfort.  Question if msk in origin.  Breathing stable.  She is having increased acid reflux.  Taking more tums.  On zantac bid.  No abdominal pain.  Bowels moving.  No urine change.     Past Medical History:  Diagnosis Date  . Abnormal results of thyroid function studies   . Allergy    hay fever  . Angina pectoris, unspecified ([place])   . Arthritis   . Atrial fibrillation, unspecified   . Chicken pox   . Cold hands   . Congenital anomaly of adrenal gland   . Diabetes mellitus    type 2, uncomplicated  . Essential hypertension, benign   . GERD (gastroesophageal reflux disease)   . IBS (irritable bowel syndrome)   . Malaise   . Morbid obesity ([place])   . Murmur   . Pain in soft tissues of limb   . Proteinuria   . Pure hypercholesterolemia   . Rash and other nonspecific skin eruption   . Sleep apnea    Obstructive; Uses C-Pap machine  . Urinary incontinence    Past Surgical History:  Procedure Laterality Date  . ABDOMINAL HYSTERECTOMY  2010  . BLADDER SURGERY  1967   stretching of bladder  . BREAST BIOPSY Left 03/28/2015   done at Dr. Dwyane Luo office  . COLONOSCOPY    . COLONOSCOPY WITH PROPOFOL N/A 09/09/2015   Procedure: COLONOSCOPY WITH PROPOFOL;  Surgeon: Manya Silvas, MD;  Location: [facility] ENDOSCOPY;  Service:  Endoscopy;  Laterality: N/A;  . ESOPHAGOGASTRODUODENOSCOPY    . TONSILLECTOMY AND ADENOIDECTOMY  1959  . URETHRAL DILATION     Family History  Problem Relation Age of Onset  . Diabetes Mother   . Cancer Father     lung  . Breast cancer Maternal Grandmother   . Breast cancer Maternal Aunt    Social History   Social History  . Marital status: Married    Spouse name: N/A  . Number of children: N/A  . Years of education: N/A   Social History Main Topics  . Smoking status: Never Smoker  . Smokeless tobacco: Never Used  . Alcohol use No  . Drug use: No  . Sexual activity: Not Asked   Other Topics Concern  . None   Social History Narrative  . None    Outpatient Encounter Prescriptions as of 12/24/2016  Medication Sig  . aspirin 81 MG tablet Take 81 mg by mouth daily.  . budesonide (PULMICORT) 180 MCG/ACT inhaler Inhale 2 puffs into the lungs 2 (two) times daily.  . cetirizine (ZYRTEC) 10 MG tablet Take 10 mg by mouth daily.  . clotrimazole-betamethasone (LOTRISONE) cream Apply 1 application topically 2 (two) times daily.  Marland Kitchen glimepiride (AMARYL) 2 MG tablet 1/2 tablet bid  .  hydrochlorothiazide (HYDRODIURIL) 25 MG tablet TAKE 1 TABLET BY MOUTH EVERY DAY  . lisinopril (PRINIVIL,ZESTRIL) 10 MG tablet TAKE 1 TABLET BY MOUTH ONCE DAILY  . metFORMIN (GLUCOPHAGE) 1000 MG tablet TAKE 1 TABLET BY MOUTH TWICE DAILY WITH MEALS  . metoprolol tartrate (LOPRESSOR) 25 MG tablet Take 25 mg by mouth 2 (two) times daily.  . Multiple Vitamin (MULTIVITAMIN) tablet Take 1 tablet by mouth daily.  Marland Kitchen omeprazole (PRILOSEC) 20 MG capsule TAKE 1 CAPSULE BY MOUTH TWICE DAILY  . oxybutynin (DITROPAN) 5 MG tablet Take 1 tablet (5 mg total) by mouth 2 (two) times daily.  . pantoprazole (PROTONIX) 40 MG tablet Take 1 tablet (40 mg total) by mouth daily. Take 30 minutes before breakfast  . [DISCONTINUED] beclomethasone (QVAR) 40 MCG/ACT inhaler Inhale 1 puff into the lungs 2 (two) times daily. (Patient not  taking: Reported on 12/24/2016)  . [DISCONTINUED] linagliptin (TRADJENTA) 5 MG TABS tablet Take 1 tablet (5 mg total) by mouth daily. (Patient not taking: Reported on 12/24/2016)   No facility-administered encounter medications on file as of 12/24/2016.     Review of Systems  Constitutional: Negative for appetite change and unexpected weight change.  HENT: Negative for congestion and sinus pressure.   Eyes: Negative for pain and visual disturbance.  Respiratory: Negative for cough and shortness of breath.   Cardiovascular: Positive for chest pain. Negative for palpitations.  Gastrointestinal: Negative for abdominal pain, diarrhea, nausea and vomiting.  Genitourinary: Negative for difficulty urinating and dysuria.  Musculoskeletal: Negative for back pain and joint swelling.       Upper arm pain as outlined.    Skin: Negative for color change and rash.  Neurological: Negative for dizziness and headaches.  Hematological: Negative for adenopathy. Does not bruise/bleed easily.  Psychiatric/Behavioral: Negative for agitation and dysphoric mood.       Objective:     Blood pressure rechecked by me:  134/86  Physical Exam  Constitutional: She is oriented to person, place, and time. She appears well-developed and well-nourished. No distress.  HENT:  Nose: Nose normal.  Mouth/Throat: Oropharynx is clear and moist.  Eyes: Right eye exhibits no discharge. Left eye exhibits no discharge. No scleral icterus.  Neck: Neck supple. No thyromegaly present.  Cardiovascular: Normal rate and regular rhythm.   Pulmonary/Chest: Breath sounds normal. No accessory muscle usage. No tachypnea. No respiratory distress. She has no decreased breath sounds. She has no wheezes. She has no rhonchi. Right breast exhibits no inverted nipple, no mass, no nipple discharge and no tenderness (no axillary adenopathy). Left breast exhibits no inverted nipple, no mass, no nipple discharge and no tenderness (no axilarry  adenopathy).  Abdominal: Soft. Bowel sounds are normal. There is no tenderness.  Genitourinary:  Genitourinary Comments: Normal external genitalia.  Vaginal vault without lesions.  s/p hysterectomy.  Pap smear performed.  Could not appreciate any adnexal masses or tenderness.    Musculoskeletal: She exhibits no edema or tenderness.  Lymphadenopathy:    She has no cervical adenopathy.  Neurological: She is alert and oriented to person, place, and time.  Skin: Skin is warm. No rash noted. No erythema.  Psychiatric: She has a normal mood and affect. Her behavior is normal.    BP 134/86   Pulse 60   Temp 97.7 F (36.5 C) (Oral)   Resp 18   Ht '5\' 3"'$  (1.6 m)   Wt (!) 301 lb 8 oz (136.8 kg)   SpO2 96%   BMI 53.41 kg/m  Wt Readings from  Last 3 Encounters:  12/24/16 (!) 301 lb 8 oz (136.8 kg)  08/31/16 299 lb 3.2 oz (135.7 kg)  04/02/16 (!) 301 lb (136.5 kg)     Lab Results  Component Value Date   WBC 9.2 08/31/2016   HGB 12.9 08/31/2016   HCT 37.7 08/31/2016   PLT 248.0 08/31/2016   GLUCOSE 127 (H) 12/24/2016   CHOL 168 12/24/2016   TRIG 133.0 12/24/2016   HDL 57.50 12/24/2016   LDLDIRECT 101.0 04/19/2015   LDLCALC 84 12/24/2016   ALT 34 12/24/2016   AST 25 12/24/2016   NA 139 12/24/2016   K 4.3 12/24/2016   CL 101 12/24/2016   CREATININE 0.70 12/24/2016   BUN 17 12/24/2016   CO2 30 12/24/2016   TSH 0.68 08/31/2016   INR 0.99 11/15/2012   HGBA1C 7.8 (H) 12/24/2016   MICROALBUR 1.5 08/31/2016    Mm Screening Breast Tomo Bilateral  Result Date: 03/28/2016 CLINICAL DATA:  Screening. History of benign left breast biopsy. EXAM: 2D DIGITAL SCREENING BILATERAL MAMMOGRAM WITH CAD AND ADJUNCT TOMO COMPARISON:  Previous exam(s). ACR Breast Density Category b: There are scattered areas of fibroglandular density. FINDINGS: Biopsy site markers within the left breast are stable in position. There are no findings suspicious for malignancy within either breast. Images were processed  with CAD. IMPRESSION: No mammographic evidence of malignancy. A result letter of this screening mammogram will be mailed directly to the patient. RECOMMENDATION: Screening mammogram in one year. (Code:SM-B-01Y) BI-RADS CATEGORY  2: Benign. Electronically Signed   By: Franki Cabot M.D.   On: 03/28/2016 16:13      Assessment & Plan:   Problem List Items Addressed This Visit    Atrial fibrillation ([place])    In SR.  Has been evaluated by cardiology.  Follow.        Diabetes mellitus ([place])    Just started amaryl.  Cost of medication is an issue.  On metformin.  Sugars as outlined.  Low carb diet and exercise.  Follow met b and a1c.        Relevant Orders   Hemoglobin A1c (Completed)   POCT Glucose (CBG) (Completed)   GERD (gastroesophageal reflux disease)    Increased acid reflux.  Trial of protonix.  Follow.        Relevant Medications   pantoprazole (PROTONIX) 40 MG tablet   Health care maintenance    Physical today 12/24/16.  Mammogram 03/28/16 - Birads II.  Colonoscopy 2013.        Hypercholesteremia    Low cholesterol diet and exercise.  Follow lipid panel.        Relevant Orders   Hepatic function panel (Completed)   Lipid panel (Completed)   Hypertension    Blood pressure as outlined.  Follow pressures.  Follow metabolic panel.        Relevant Orders   Basic metabolic panel (Completed)   Obstructive sleep apnea    CPAP.       Severe obesity (BMI >= 40) (HCC)    Diet and exercise.  Follow.        Urinary incontinence    Other Visit Diagnoses    Chest pain, unspecified type    -  Primary   with upper arm soreness.  EKG - SR with no acute ischemic changes.  discussed further cardiac w/up.  will refer.  trial of protonix.  follow.     Relevant Orders   EKG 12-Lead (Completed)   Ambulatory referral to Cardiology  Bilateral arm pain       Relevant Orders   Sedimentation rate (Completed)   Encounter for Papanicolaou smear for cervical cancer screening        Relevant Orders   Cytology - PAP (Completed)       Einar Pheasant, MD

## 2016-12-25 LAB — CYTOLOGY - PAP
Diagnosis: NEGATIVE
HPV: NOT DETECTED

## 2017-01-05 ENCOUNTER — Encounter: Payer: Self-pay | Admitting: Internal Medicine

## 2017-01-05 NOTE — Assessment & Plan Note (Signed)
Blood pressure as outlined.  Follow pressures.  Follow metabolic panel.   

## 2017-01-05 NOTE — Assessment & Plan Note (Signed)
Just started amaryl.  Cost of medication is an issue.  On metformin.  Sugars as outlined.  Low carb diet and exercise.  Follow met b and a1c.

## 2017-01-05 NOTE — Assessment & Plan Note (Signed)
In SR.  Has been evaluated by cardiology.  Follow.

## 2017-01-05 NOTE — Assessment & Plan Note (Signed)
Low cholesterol diet and exercise.  Follow lipid panel.   

## 2017-01-05 NOTE — Assessment & Plan Note (Signed)
Diet and exercise.  Follow.  

## 2017-01-05 NOTE — Assessment & Plan Note (Signed)
Increased acid reflux.  Trial of protonix.  Follow.

## 2017-01-05 NOTE — Assessment & Plan Note (Signed)
CPAP.  

## 2017-02-25 ENCOUNTER — Other Ambulatory Visit: Payer: Self-pay

## 2017-02-25 MED ORDER — GLIMEPIRIDE 2 MG PO TABS: ORAL_TABLET | ORAL | 1 refills | Status: DC

## 2017-02-25 MED ORDER — METFORMIN HCL 1000 MG PO TABS: 1000.0000 mg | ORAL_TABLET | Freq: Two times a day (BID) | ORAL | 1 refills | Status: DC

## 2017-02-28 ENCOUNTER — Other Ambulatory Visit: Payer: Self-pay | Admitting: Internal Medicine

## 2017-04-01 ENCOUNTER — Other Ambulatory Visit: Payer: Self-pay | Admitting: Internal Medicine

## 2017-04-25 ENCOUNTER — Ambulatory Visit (INDEPENDENT_AMBULATORY_CARE_PROVIDER_SITE_OTHER): Payer: 59

## 2017-04-25 ENCOUNTER — Encounter: Payer: Self-pay | Admitting: Internal Medicine

## 2017-04-25 ENCOUNTER — Ambulatory Visit (INDEPENDENT_AMBULATORY_CARE_PROVIDER_SITE_OTHER): Payer: 59 | Admitting: Internal Medicine

## 2017-04-25 VITALS — BP 136/84 | HR 76 | Temp 97.6°F | Ht 63.0 in | Wt 308.0 lb

## 2017-04-25 DIAGNOSIS — E118 Type 2 diabetes mellitus with unspecified complications: Secondary | ICD-10-CM | POA: Diagnosis not present

## 2017-04-25 DIAGNOSIS — G4733 Obstructive sleep apnea (adult) (pediatric): Secondary | ICD-10-CM | POA: Diagnosis not present

## 2017-04-25 DIAGNOSIS — R0602 Shortness of breath: Secondary | ICD-10-CM | POA: Diagnosis not present

## 2017-04-25 DIAGNOSIS — K219 Gastro-esophageal reflux disease without esophagitis: Secondary | ICD-10-CM

## 2017-04-25 DIAGNOSIS — E7800 Pure hypercholesterolemia, unspecified: Secondary | ICD-10-CM

## 2017-04-25 DIAGNOSIS — I4891 Unspecified atrial fibrillation: Secondary | ICD-10-CM | POA: Diagnosis not present

## 2017-04-25 DIAGNOSIS — Z6841 Body Mass Index (BMI) 40.0 and over, adult: Secondary | ICD-10-CM | POA: Diagnosis not present

## 2017-04-25 DIAGNOSIS — I10 Essential (primary) hypertension: Secondary | ICD-10-CM | POA: Diagnosis not present

## 2017-04-25 MED ORDER — BUDESONIDE 180 MCG/ACT IN AEPB: 2.0000 | INHALATION_SPRAY | Freq: Two times a day (BID) | RESPIRATORY_TRACT | 3 refills | Status: DC

## 2017-04-25 NOTE — Progress Notes (Signed)
Patient ID: Christine Robertson, female   DOB: 01-Feb-1956, 61 y.o.   MRN: [id number]   Subjective:    Patient ID: Christine Robertson, female    DOB: 09-Jan-1956, 61 y.o.   MRN: [id number]  HPI  Patient here for a scheduled follow up.  She reports she is doing relatively well.  Last evaluated 12/2016.  Saw cardiology 01/2017.  ECHO with preserved LV function with no significant structural valvular abnormalities.  Holter monitor with SR and PACs.  No sustained or intermittent afib.  On flecainide and metoprolol.  She reports noticing some increased sob with exertion.  Relates to being out of shape.  She also reports noticing some right side chest discomfort.  Vague symptoms.  Discussed further cardiac w/up including repeat ekg and f/u with cardiology.  She declines.  Relates to not exercising.  No significant wheezing now.  No increased cough or congestion.  Request to have her rescue inhaler refilled to have if needed.  Discussed acid reflux.  No abdominal pain.  Bowels moving.  Not watching her diet.  Not exercising.  States sugars are averaging 140-160 in the am.     Past Medical History:  Diagnosis Date  . Abnormal results of thyroid function studies   . Allergy    hay fever  . Angina pectoris, unspecified ([place])   . Arthritis   . Atrial fibrillation, unspecified   . Chicken pox   . Cold hands   . Congenital anomaly of adrenal gland   . Diabetes mellitus    type 2, uncomplicated  . Essential hypertension, benign   . GERD (gastroesophageal reflux disease)   . IBS (irritable bowel syndrome)   . Malaise   . Morbid obesity ([place])   . Murmur   . Pain in soft tissues of limb   . Proteinuria   . Pure hypercholesterolemia   . Rash and other nonspecific skin eruption   . Sleep apnea    Obstructive; Uses C-Pap machine  . Urinary incontinence    Past Surgical History:  Procedure Laterality Date  . ABDOMINAL HYSTERECTOMY  2010  . BLADDER SURGERY  1967   stretching of bladder  . BREAST BIOPSY Left  03/28/2015   done at Dr. Dwyane Luo office  . COLONOSCOPY    . COLONOSCOPY WITH PROPOFOL N/A 09/09/2015   Procedure: COLONOSCOPY WITH PROPOFOL;  Surgeon: Manya Silvas, MD;  Location: [facility] ENDOSCOPY;  Service: Endoscopy;  Laterality: N/A;  . ESOPHAGOGASTRODUODENOSCOPY    . TONSILLECTOMY AND ADENOIDECTOMY  1959  . URETHRAL DILATION     Family History  Problem Relation Age of Onset  . Diabetes Mother   . Cancer Father        lung  . Breast cancer Maternal Grandmother   . Breast cancer Maternal Aunt    Social History   Social History  . Marital status: Married    Spouse name: N/A  . Number of children: N/A  . Years of education: N/A   Social History Main Topics  . Smoking status: Never Smoker  . Smokeless tobacco: Never Used  . Alcohol use No  . Drug use: No  . Sexual activity: Not Asked   Other Topics Concern  . None   Social History Narrative  . None    Outpatient Encounter Prescriptions as of 04/25/2017  Medication Sig  . aspirin 81 MG tablet Take 81 mg by mouth daily.  . cetirizine (ZYRTEC) 10 MG tablet Take 10 mg by mouth daily.  . clotrimazole-betamethasone (LOTRISONE) cream  Apply 1 application topically 2 (two) times daily.  Marland Kitchen glimepiride (AMARYL) 2 MG tablet 1/2 tablet bid  . hydrochlorothiazide (HYDRODIURIL) 25 MG tablet TAKE 1 TABLET BY MOUTH ONCE DAILY  . lisinopril (PRINIVIL,ZESTRIL) 10 MG tablet TAKE 1 TABLET BY MOUTH ONCE DAILY  . metFORMIN (GLUCOPHAGE) 1000 MG tablet Take 1 tablet (1,000 mg total) by mouth 2 (two) times daily with a meal.  . metoprolol tartrate (LOPRESSOR) 25 MG tablet Take 25 mg by mouth 2 (two) times daily.  . Multiple Vitamin (MULTIVITAMIN) tablet Take 1 tablet by mouth daily.  Marland Kitchen omeprazole (PRILOSEC) 20 MG capsule TAKE 1 CAPSULE BY MOUTH TWICE DAILY (Patient taking differently: TAKE 1 CAPSULE BY MOUTH  DAILY)  . oxybutynin (DITROPAN) 5 MG tablet TAKE 1 TABLET BY MOUTH TWICE DAILY  . [DISCONTINUED] budesonide (PULMICORT) 180 MCG/ACT  inhaler Inhale 2 puffs into the lungs 2 (two) times daily.  . [DISCONTINUED] pantoprazole (PROTONIX) 40 MG tablet Take 1 tablet (40 mg total) by mouth daily. Take 30 minutes before breakfast  . budesonide (PULMICORT FLEXHALER) 180 MCG/ACT inhaler Inhale 2 puffs into the lungs 2 (two) times daily.   No facility-administered encounter medications on file as of 04/25/2017.     Review of Systems  Constitutional: Negative for appetite change and fever.  HENT: Negative for congestion and sinus pressure.   Respiratory: Negative for cough and shortness of breath.   Cardiovascular: Positive for chest pain. Negative for palpitations and leg swelling.  Gastrointestinal: Negative for abdominal pain, diarrhea, nausea and vomiting.  Genitourinary: Negative for difficulty urinating and dysuria.  Musculoskeletal: Negative for back pain and myalgias.  Skin: Negative for color change and rash.  Neurological: Negative for dizziness, light-headedness and headaches.  Psychiatric/Behavioral: Negative for agitation and dysphoric mood.       Objective:    Physical Exam  Constitutional: She appears well-developed and well-nourished. No distress.  HENT:  Nose: Nose normal.  Mouth/Throat: Oropharynx is clear and moist.  Neck: Neck supple. No thyromegaly present.  Cardiovascular: Normal rate and regular rhythm.   Pulmonary/Chest: Breath sounds normal. No respiratory distress. She has no wheezes.  Abdominal: Soft. Bowel sounds are normal. There is no tenderness.  Musculoskeletal: She exhibits no edema or tenderness.  Lymphadenopathy:    She has no cervical adenopathy.  Skin: No rash noted. No erythema.  Psychiatric: She has a normal mood and affect. Her behavior is normal.    BP 136/84 (BP Location: Left Arm, Patient Position: Sitting, Cuff Size: Large)   Pulse 76   Temp 97.6 F (36.4 C) (Oral)   Ht _0  (1.6 m)   Wt (!) 308 lb (139.7 kg)   SpO2 96%   BMI 54.56 kg/m  Wt Readings from Last 3  Encounters:  04/25/17 (!) 308 lb (139.7 kg)  12/24/16 (!) 301 lb 8 oz (136.8 kg)  08/31/16 299 lb 3.2 oz (135.7 kg)     Lab Results  Component Value Date   WBC 9.2 08/31/2016   HGB 12.9 08/31/2016   HCT 37.7 08/31/2016   PLT 248.0 08/31/2016   GLUCOSE 127 (H) 12/24/2016   CHOL 168 12/24/2016   TRIG 133.0 12/24/2016   HDL 57.50 12/24/2016   LDLDIRECT 101.0 04/19/2015   LDLCALC 84 12/24/2016   ALT 34 12/24/2016   AST 25 12/24/2016   NA 139 12/24/2016   K 4.3 12/24/2016   CL 101 12/24/2016   CREATININE 0.70 12/24/2016   BUN 17 12/24/2016   CO2 30 12/24/2016   TSH 0.68 08/31/2016  INR 0.99 11/15/2012   HGBA1C 7.8 (H) 12/24/2016   MICROALBUR 1.5 08/31/2016       Assessment & Plan:   Problem List Items Addressed This Visit    Atrial fibrillation ([place])    Followed by Dr Ubaldo Glassing.  Last evaluated 01/2017.  In SR.  On aspirin.  No changes made.        BMI 50.0-59.9, adult (HCC)    Discussed diet and exercise.  Discussed the need for weight loss.  She plans to start getting more serious about her diet and exercise.  Follow.        Diabetes mellitus ([place])    Discussed low carb diet and exercise.  Discussed the need for weight loss.  Follow met b and a1c.        Relevant Orders   Hemoglobin A1c   GERD (gastroesophageal reflux disease)    On omeprazole.  Question of acid reflux.  Discussed adding zantac or temporarily increasing to bid omeprazole to see if symptoms resolve.        Hypercholesteremia    Low cholesterol diet and exercise.  Follow lipid panel.        Relevant Orders   Hepatic function panel   Lipid panel   Hypertension    Blood pressure under good control.  Continue same medication regimen.  Follow pressures.  Follow metabolic panel.        Relevant Orders   TSH   CBC with Differential/Platelet   Basic metabolic panel   Obstructive sleep apnea    CPAP.       SOB (shortness of breath) on exertion    With sob on exertion.  Some intermittent chest  pain as outlined.  Saw cardiology 01/2017.  Had holter and echo as outlined.  Discussed further cardiac w/up with her today.  She declines at this time.  Relates symptoms to decreased stamina.  Refilled inhaler.  Check cxr.  Check routine labs.  Follow closely.         Other Visit Diagnoses    SOB (shortness of breath)    -  Primary   Relevant Orders   DG Chest 2 View (Completed)       Einar Pheasant, MD

## 2017-04-25 NOTE — Progress Notes (Signed)
Pre-visit discussion using our clinic review tool. No additional management support is needed unless otherwise documented below in the visit note.  

## 2017-04-26 ENCOUNTER — Telehealth: Payer: Self-pay

## 2017-04-26 ENCOUNTER — Other Ambulatory Visit: Payer: 59

## 2017-04-26 DIAGNOSIS — Z1239 Encounter for other screening for malignant neoplasm of breast: Secondary | ICD-10-CM

## 2017-04-26 NOTE — Telephone Encounter (Signed)
Patient needs lab orders placed . Patient would like to be called once orders have been placed. Unable to draw patient today.  Please advise.

## 2017-04-27 ENCOUNTER — Encounter: Payer: Self-pay | Admitting: Internal Medicine

## 2017-04-27 DIAGNOSIS — R0602 Shortness of breath: Secondary | ICD-10-CM | POA: Insufficient documentation

## 2017-04-27 NOTE — Assessment & Plan Note (Signed)
With sob on exertion.  Some intermittent chest pain as outlined.  Saw cardiology 01/2017.  Had holter and echo as outlined.  Discussed further cardiac w/up with her today.  She declines at this time.  Relates symptoms to decreased stamina.  Refilled inhaler.  Check cxr.  Check routine labs.  Follow closely.

## 2017-04-27 NOTE — Assessment & Plan Note (Signed)
CPAP.  

## 2017-04-27 NOTE — Assessment & Plan Note (Signed)
Followed by Dr Lady Gary.  Last evaluated 01/2017.  In SR.  On aspirin.  No changes made.

## 2017-04-27 NOTE — Assessment & Plan Note (Signed)
Discussed low carb diet and exercise.  Discussed the need for weight loss.  Follow met b and a1c.

## 2017-04-27 NOTE — Assessment & Plan Note (Signed)
Low cholesterol diet and exercise.  Follow lipid panel.   

## 2017-04-27 NOTE — Assessment & Plan Note (Signed)
Blood pressure under good control.  Continue same medication regimen.  Follow pressures.  Follow metabolic panel.   

## 2017-04-27 NOTE — Telephone Encounter (Signed)
Orders placed for labs.  Please schedule her a lab appt.  Also, notify her that she is overdue a mammogram.  Need to schedule.

## 2017-04-27 NOTE — Assessment & Plan Note (Signed)
On omeprazole.  Question of acid reflux.  Discussed adding zantac or temporarily increasing to bid omeprazole to see if symptoms resolve.

## 2017-04-27 NOTE — Assessment & Plan Note (Signed)
Discussed diet and exercise.  Discussed the need for weight loss.  She plans to start getting more serious about her diet and exercise.  Follow.

## 2017-04-29 NOTE — Addendum Note (Signed)
Addended by: Donnamarie Poag on: 04/29/2017 01:59 PM   Modules accepted: Orders

## 2017-04-29 NOTE — Telephone Encounter (Signed)
Have called patient made lab app. I will send information on mammogram via my chart. Patient states that she was given script for Pulmicort but it cost to much she wanted to know if their was another option?

## 2017-04-29 NOTE — Telephone Encounter (Signed)
Can see if flovent is cheaper.  Can send in rx for flovent - 2 puffs bid with 2 refills.  Also, can check with pharmacy and see if they can tell if another steroid inhaler would be cheaper.  Or can call insurance company to see if cheaper steroid inhaler.

## 2017-05-01 NOTE — Telephone Encounter (Signed)
Left message to return call to our office.  

## 2017-05-02 ENCOUNTER — Other Ambulatory Visit (INDEPENDENT_AMBULATORY_CARE_PROVIDER_SITE_OTHER): Payer: 59

## 2017-05-02 DIAGNOSIS — E118 Type 2 diabetes mellitus with unspecified complications: Secondary | ICD-10-CM

## 2017-05-02 DIAGNOSIS — I10 Essential (primary) hypertension: Secondary | ICD-10-CM

## 2017-05-02 DIAGNOSIS — E7800 Pure hypercholesterolemia, unspecified: Secondary | ICD-10-CM

## 2017-05-02 LAB — BASIC METABOLIC PANEL
BUN: 18 mg/dL (ref 6–23)
CALCIUM: 9.8 mg/dL (ref 8.4–10.5)
CHLORIDE: 101 meq/L (ref 96–112)
CO2: 28 mEq/L (ref 19–32)
Creatinine, Ser: 0.69 mg/dL (ref 0.40–1.20)
GFR: 91.31 mL/min (ref >60.00)
Glucose, Bld: 175 mg/dL — ABNORMAL HIGH (ref 70–99)
Potassium: 4.1 mEq/L (ref 3.5–5.1)
Sodium: 139 mEq/L (ref 135–145)

## 2017-05-02 LAB — CBC WITH DIFFERENTIAL/PLATELET
BASOS ABS: 0.1 10*3/uL (ref 0.0–0.1)
Basophils Relative: 0.9 % (ref 0.0–3.0)
Eosinophils Absolute: 0.2 10*3/uL (ref 0.0–0.7)
Eosinophils Relative: 2.5 % (ref 0.0–5.0)
HCT: 37.6 % (ref 36.0–46.0)
Hemoglobin: 12.6 g/dL (ref 12.0–15.0)
LYMPHS ABS: 2.2 10*3/uL (ref 0.7–4.0)
LYMPHS PCT: 23.9 % (ref 12.0–46.0)
MCHC: 33.4 g/dL (ref 30.0–36.0)
MCV: 87.6 fl (ref 78.0–100.0)
MONOS PCT: 6.2 % (ref 3.0–12.0)
Monocytes Absolute: 0.6 10*3/uL (ref 0.1–1.0)
NEUTROS PCT: 66.5 % (ref 43.0–77.0)
Neutro Abs: 6 10*3/uL (ref 1.4–7.7)
Platelets: 247 10*3/uL (ref 150.0–400.0)
RBC: 4.29 Mil/uL (ref 3.87–5.11)
RDW: 13.6 % (ref 11.5–15.5)
WBC: 9 10*3/uL (ref 4.0–10.5)

## 2017-05-02 LAB — LIPID PANEL
CHOLESTEROL: 163 mg/dL (ref 0–200)
HDL: 51.2 mg/dL (ref >39.00)
LDL Cholesterol: 75 mg/dL (ref 0–99)
NONHDL: 111.62
Total CHOL/HDL Ratio: 3
Triglycerides: 181 mg/dL — ABNORMAL HIGH (ref 0.0–149.0)
VLDL: 36.2 mg/dL (ref 0.0–40.0)

## 2017-05-02 LAB — HEPATIC FUNCTION PANEL
ALBUMIN: 4.1 g/dL (ref 3.5–5.2)
ALK PHOS: 55 U/L (ref 39–117)
ALT: 40 U/L — ABNORMAL HIGH (ref 0–35)
AST: 28 U/L (ref 0–37)
Bilirubin, Direct: 0.1 mg/dL (ref 0.0–0.3)
TOTAL PROTEIN: 7 g/dL (ref 6.0–8.3)
Total Bilirubin: 0.5 mg/dL (ref 0.2–1.2)

## 2017-05-02 LAB — TSH: TSH: 1.15 u[IU]/mL (ref 0.35–4.50)

## 2017-05-02 LAB — HEMOGLOBIN A1C: HEMOGLOBIN A1C: 7.7 % — AB (ref 4.6–6.5)

## 2017-05-03 MED ORDER — FLUTICASONE PROPIONATE HFA 110 MCG/ACT IN AERO: 2.0000 | INHALATION_SPRAY | Freq: Two times a day (BID) | RESPIRATORY_TRACT | 2 refills | Status: DC

## 2017-05-03 NOTE — Telephone Encounter (Signed)
Patient called back would like order sent I have done so. She does not want to look into getting any further scripts due to deductible.

## 2017-05-03 NOTE — Addendum Note (Signed)
Addended by: Donnamarie PoagHOMPSON, JOELLEN Y on: 05/03/2017 10:33 AM   Modules accepted: Orders

## 2017-05-06 ENCOUNTER — Other Ambulatory Visit: Payer: Self-pay | Admitting: Internal Medicine

## 2017-05-06 DIAGNOSIS — R0602 Shortness of breath: Secondary | ICD-10-CM

## 2017-05-06 DIAGNOSIS — R945 Abnormal results of liver function studies: Secondary | ICD-10-CM

## 2017-05-06 DIAGNOSIS — R7989 Other specified abnormal findings of blood chemistry: Secondary | ICD-10-CM

## 2017-05-06 NOTE — Progress Notes (Signed)
Order placed for abdominal ultrasound and for cardiology referral.

## 2017-05-14 ENCOUNTER — Ambulatory Visit: Payer: Managed Care, Other (non HMO)

## 2017-05-14 ENCOUNTER — Ambulatory Visit
Admission: RE | Admit: 2017-05-14 | Discharge: 2017-05-14 | Disposition: A | Discharge status: Home / Self Care | Payer: Managed Care, Other (non HMO) | Source: Ambulatory Visit | Attending: Internal Medicine | Admitting: Internal Medicine

## 2017-05-14 DIAGNOSIS — Z1231 Encounter for screening mammogram for malignant neoplasm of breast: Secondary | ICD-10-CM | POA: Diagnosis present

## 2017-05-14 DIAGNOSIS — R921 Mammographic calcification found on diagnostic imaging of breast: Secondary | ICD-10-CM | POA: Diagnosis not present

## 2017-05-14 DIAGNOSIS — Z1239 Encounter for other screening for malignant neoplasm of breast: Secondary | ICD-10-CM

## 2017-05-16 ENCOUNTER — Other Ambulatory Visit: Payer: Self-pay | Admitting: Internal Medicine

## 2017-05-16 ENCOUNTER — Ambulatory Visit
Admission: RE | Admit: 2017-05-16 | Discharge: 2017-05-16 | Disposition: A | Discharge status: Home / Self Care | Payer: Managed Care, Other (non HMO) | Source: Ambulatory Visit | Attending: Internal Medicine | Admitting: Internal Medicine

## 2017-05-16 ENCOUNTER — Other Ambulatory Visit: Payer: Self-pay

## 2017-05-16 DIAGNOSIS — R932 Abnormal findings on diagnostic imaging of liver and biliary tract: Secondary | ICD-10-CM | POA: Insufficient documentation

## 2017-05-16 DIAGNOSIS — R7989 Other specified abnormal findings of blood chemistry: Secondary | ICD-10-CM

## 2017-05-16 DIAGNOSIS — R945 Abnormal results of liver function studies: Secondary | ICD-10-CM | POA: Insufficient documentation

## 2017-05-16 DIAGNOSIS — R928 Other abnormal and inconclusive findings on diagnostic imaging of breast: Secondary | ICD-10-CM

## 2017-05-16 MED ORDER — GLIMEPIRIDE 2 MG PO TABS: 2.0000 mg | ORAL_TABLET | Freq: Every day | ORAL | 3 refills | Status: DC

## 2017-05-16 NOTE — Progress Notes (Signed)
Order placed for left breast mammogram and ultrasound.  

## 2017-05-20 ENCOUNTER — Telehealth: Payer: Self-pay | Admitting: Internal Medicine

## 2017-05-20 DIAGNOSIS — Z1239 Encounter for other screening for malignant neoplasm of breast: Secondary | ICD-10-CM

## 2017-05-20 NOTE — Telephone Encounter (Signed)
Patient was calling in regard to u/s results see result note for documentation.

## 2017-05-20 NOTE — Telephone Encounter (Signed)
Pt returned office phone call. Pt was notified that her glimepiride (AMARYL) 2 MG tablet was refilled on 05/16/2017.

## 2017-05-21 ENCOUNTER — Other Ambulatory Visit: Payer: Self-pay

## 2017-05-21 DIAGNOSIS — Z1239 Encounter for other screening for malignant neoplasm of breast: Secondary | ICD-10-CM

## 2017-05-21 MED ORDER — GLIMEPIRIDE 2 MG PO TABS: 2.0000 mg | ORAL_TABLET | Freq: Two times a day (BID) | ORAL | 3 refills | Status: DC

## 2017-05-21 NOTE — Telephone Encounter (Signed)
Pt called back and stated that her medication was refilled wrong. Pt states that she is to be taking 1 tablet 2 times daily. Please advise, thank you!  Pharmacy - [id number] - [address], [place] - [address]

## 2017-05-21 NOTE — Telephone Encounter (Signed)
Change documented in 8/30 lab note faxed new script.

## 2017-05-23 ENCOUNTER — Other Ambulatory Visit: Payer: Self-pay | Admitting: Internal Medicine

## 2017-05-28 ENCOUNTER — Ambulatory Visit
Admission: RE | Admit: 2017-05-28 | Discharge: 2017-05-28 | Disposition: A | Discharge status: Home / Self Care | Payer: Managed Care, Other (non HMO) | Source: Ambulatory Visit | Attending: Internal Medicine | Admitting: Internal Medicine

## 2017-05-28 DIAGNOSIS — R928 Other abnormal and inconclusive findings on diagnostic imaging of breast: Secondary | ICD-10-CM | POA: Diagnosis present

## 2017-05-29 ENCOUNTER — Other Ambulatory Visit: Payer: Self-pay | Admitting: Internal Medicine

## 2017-05-29 DIAGNOSIS — R928 Other abnormal and inconclusive findings on diagnostic imaging of breast: Secondary | ICD-10-CM

## 2017-05-29 NOTE — Progress Notes (Signed)
Order placed for f/u left breast mammogram and ultrasound.   

## 2017-06-03 NOTE — Telephone Encounter (Signed)
Please change the unilateral left diagnostic mammogram to a TOMO. [id number] Thank you!

## 2017-06-03 NOTE — Telephone Encounter (Signed)
Done

## 2017-06-04 NOTE — Telephone Encounter (Signed)
Yes ma'am! 

## 2017-06-04 NOTE — Telephone Encounter (Signed)
Is it right now.

## 2017-06-04 NOTE — Telephone Encounter (Signed)
Hey- you ordered img [id number], I need the IMG [id number]. Sorry!! MT

## 2017-06-07 ENCOUNTER — Other Ambulatory Visit: Payer: Self-pay | Admitting: Internal Medicine

## 2017-06-24 ENCOUNTER — Ambulatory Visit: Payer: 59 | Admitting: Internal Medicine

## 2017-09-04 ENCOUNTER — Other Ambulatory Visit: Payer: Self-pay | Admitting: Internal Medicine

## 2017-09-12 ENCOUNTER — Telehealth: Payer: Self-pay | Admitting: Internal Medicine

## 2017-09-12 DIAGNOSIS — R350 Frequency of micturition: Secondary | ICD-10-CM

## 2017-09-12 NOTE — Telephone Encounter (Signed)
Patient says she has just started having symptoms today. Frequency and burning. No other symptoms yet but [facility] and bout the at[facility]home urine test and her urine came back positive. She was wondering if you would be willing to call something. Advised that you typically do not call in medications without evaluation. She has an appointment on Monday but really was not wanting to wait through the weekend.

## 2017-09-12 NOTE — Telephone Encounter (Signed)
Patient will be here tomorrow at 1130 to provide a urine specimen.

## 2017-09-12 NOTE — Telephone Encounter (Signed)
Can she at least leave us a urine sample so that we can confirm infection and what bacteria - so we know best how to treat.

## 2017-09-12 NOTE — Telephone Encounter (Signed)
Copied from CRM [id number]. Topic: Quick Communication - See Telephone Encounter >> Sep 12, 2017  2:05 PM Floria RavelingStovall, [name] wrote: CRM for notification. See Telephone encounter for: pt called in said that she has a UTI and would like to know if Dr Lorin PicketScott could call something in for her without coming in?  Best number [phone]     09/12/17.

## 2017-09-13 ENCOUNTER — Other Ambulatory Visit: Payer: Managed Care, Other (non HMO)

## 2017-09-16 ENCOUNTER — Ambulatory Visit: Payer: Managed Care, Other (non HMO) | Admitting: Internal Medicine

## 2017-09-20 ENCOUNTER — Other Ambulatory Visit: Payer: Self-pay | Admitting: Internal Medicine

## 2017-10-04 ENCOUNTER — Ambulatory Visit (INDEPENDENT_AMBULATORY_CARE_PROVIDER_SITE_OTHER): Payer: Managed Care, Other (non HMO) | Admitting: Internal Medicine

## 2017-10-04 ENCOUNTER — Encounter: Payer: Self-pay | Admitting: Internal Medicine

## 2017-10-04 VITALS — BP 140/78 | HR 59 | Temp 98.1°F | Resp 20 | Wt 303.0 lb

## 2017-10-04 DIAGNOSIS — E118 Type 2 diabetes mellitus with unspecified complications: Secondary | ICD-10-CM | POA: Diagnosis not present

## 2017-10-04 DIAGNOSIS — Z6841 Body Mass Index (BMI) 40.0 and over, adult: Secondary | ICD-10-CM | POA: Diagnosis not present

## 2017-10-04 DIAGNOSIS — I4891 Unspecified atrial fibrillation: Secondary | ICD-10-CM

## 2017-10-04 DIAGNOSIS — I10 Essential (primary) hypertension: Secondary | ICD-10-CM

## 2017-10-04 DIAGNOSIS — G4733 Obstructive sleep apnea (adult) (pediatric): Secondary | ICD-10-CM

## 2017-10-04 DIAGNOSIS — K219 Gastro-esophageal reflux disease without esophagitis: Secondary | ICD-10-CM | POA: Diagnosis not present

## 2017-10-04 DIAGNOSIS — E7800 Pure hypercholesterolemia, unspecified: Secondary | ICD-10-CM | POA: Diagnosis not present

## 2017-10-04 LAB — MICROALBUMIN / CREATININE URINE RATIO
CREATININE, U: 55.8 mg/dL
MICROALB UR: 2.6 mg/dL — AB (ref 0.0–1.9)
MICROALB/CREAT RATIO: 4.7 mg/g (ref 0.0–30.0)

## 2017-10-04 LAB — BASIC METABOLIC PANEL
BUN: 22 mg/dL (ref 6–23)
CHLORIDE: 102 meq/L (ref 96–112)
CO2: 29 mEq/L (ref 19–32)
CREATININE: 0.72 mg/dL (ref 0.40–1.20)
Calcium: 9.3 mg/dL (ref 8.4–10.5)
GFR: 86.82 mL/min (ref >60.00)
Glucose, Bld: 117 mg/dL — ABNORMAL HIGH (ref 70–99)
POTASSIUM: 4.1 meq/L (ref 3.5–5.1)
SODIUM: 140 meq/L (ref 135–145)

## 2017-10-04 LAB — HEMOGLOBIN A1C: Hgb A1c MFr Bld: 7.8 % — ABNORMAL HIGH (ref 4.6–6.5)

## 2017-10-04 LAB — LIPID PANEL
Cholesterol: 173 mg/dL (ref 0–200)
HDL: 43.6 mg/dL (ref >39.00)
NONHDL: 128.92
TRIGLYCERIDES: 218 mg/dL — AB (ref 0.0–149.0)
Total CHOL/HDL Ratio: 4
VLDL: 43.6 mg/dL — ABNORMAL HIGH (ref 0.0–40.0)

## 2017-10-04 LAB — HEPATIC FUNCTION PANEL
ALK PHOS: 55 U/L (ref 39–117)
ALT: 26 U/L (ref 0–35)
AST: 19 U/L (ref 0–37)
Albumin: 3.9 g/dL (ref 3.5–5.2)
BILIRUBIN TOTAL: 0.4 mg/dL (ref 0.2–1.2)
Bilirubin, Direct: 0.1 mg/dL (ref 0.0–0.3)
TOTAL PROTEIN: 6.8 g/dL (ref 6.0–8.3)

## 2017-10-04 LAB — HM DIABETES FOOT EXAM

## 2017-10-04 LAB — LDL CHOLESTEROL, DIRECT: Direct LDL: 102 mg/dL

## 2017-10-04 NOTE — Progress Notes (Signed)
Patient ID: Christine Robertson, female   DOB: [birth year], 62 y.o.   MRN: [id number]   Subjective:    Patient ID: Christine Robertson, female    DOB: 12-10-1955, 62 y.o.   MRN: [id number]  HPI  Patient here for a scheduled follow up.  She reports she is doing relatively well.  Discussed diet and exercise.  She states she is exercising some, but is not exercising regularly.  Not watching her diet.  No chest pain.  Breathing stable.  Had PAF and is s/p cardioversion.  On metoprolol.  Doing well on this regimen.  Feels from a cardiac standpoint things are stable.  Needs labs.    Past Medical History:  Diagnosis Date  . Abnormal results of thyroid function studies   . Allergy    hay fever  . Angina pectoris, unspecified ([place])   . Arthritis   . Atrial fibrillation, unspecified   . Chicken pox   . Cold hands   . Congenital anomaly of adrenal gland   . Diabetes mellitus    type 2, uncomplicated  . Essential hypertension, benign   . GERD (gastroesophageal reflux disease)   . IBS (irritable bowel syndrome)   . Malaise   . Morbid obesity ([place])   . Murmur   . Pain in soft tissues of limb   . Proteinuria   . Pure hypercholesterolemia   . Rash and other nonspecific skin eruption   . Sleep apnea    Obstructive; Uses C-Pap machine  . Urinary incontinence    Past Surgical History:  Procedure Laterality Date  . ABDOMINAL HYSTERECTOMY  2010  . BLADDER SURGERY  1967   stretching of bladder  . BREAST BIOPSY Left 03/28/2015   done at Dr. Dwyane Luo office. fibrocystic changes  . COLONOSCOPY    . COLONOSCOPY WITH PROPOFOL N/A 09/09/2015   Procedure: COLONOSCOPY WITH PROPOFOL;  Surgeon: Manya Silvas, MD;  Location: [facility] ENDOSCOPY;  Service: Endoscopy;  Laterality: N/A;  . ESOPHAGOGASTRODUODENOSCOPY    . TONSILLECTOMY AND ADENOIDECTOMY  1959  . URETHRAL DILATION     Family History  Problem Relation Age of Onset  . Diabetes Mother   . Cancer Father        lung  . Breast cancer Maternal  Grandmother 60  . Breast cancer Maternal Aunt 65   Social History   Socioeconomic History  . Marital status: Married    Spouse name: None  . Number of children: None  . Years of education: None  . Highest education level: None  Social Needs  . Financial resource strain: None  . Food insecurity - worry: None  . Food insecurity - inability: None  . Transportation needs - medical: None  . Transportation needs - non-medical: None  Occupational History  . None  Tobacco Use  . Smoking status: Never Smoker  . Smokeless tobacco: Never Used  Substance and Sexual Activity  . Alcohol use: No    Alcohol/week: 0.0 oz  . Drug use: No  . Sexual activity: None  Other Topics Concern  . None  Social History Narrative  . None    Outpatient Encounter Medications as of 10/04/2017  Medication Sig  . aspirin 81 MG tablet Take 81 mg by mouth daily.  . budesonide (PULMICORT FLEXHALER) 180 MCG/ACT inhaler Inhale 2 puffs into the lungs 2 (two) times daily.  . cetirizine (ZYRTEC) 10 MG tablet Take 10 mg by mouth daily.  . clotrimazole-betamethasone (LOTRISONE) cream Apply 1 application topically 2 (two) times daily.  Marland Kitchen  fluticasone (FLOVENT HFA) 110 MCG/ACT inhaler Inhale 2 puffs into the lungs 2 (two) times daily.  Marland Kitchen glimepiride (AMARYL) 2 MG tablet TAKE 1 TABLET BY MOUTH TWICE DAILY  . hydrochlorothiazide (HYDRODIURIL) 25 MG tablet TAKE 1 TABLET BY MOUTH ONCE DAILY  . lisinopril (PRINIVIL,ZESTRIL) 10 MG tablet TAKE 1 TABLET BY MOUTH ONCE DAILY  . metFORMIN (GLUCOPHAGE) 1000 MG tablet Take 1 tablet (1,000 mg total) by mouth 2 (two) times daily with a meal.  . metoprolol tartrate (LOPRESSOR) 25 MG tablet Take 25 mg by mouth 2 (two) times daily.  . Multiple Vitamin (MULTIVITAMIN) tablet Take 1 tablet by mouth daily.  Marland Kitchen omeprazole (PRILOSEC) 20 MG capsule TAKE 1 CAPSULE BY MOUTH TWICE DAILY (Patient taking differently: TAKE 1 CAPSULE BY MOUTH  DAILY)  . oxybutynin (DITROPAN) 5 MG tablet TAKE 1 TABLET  BY MOUTH TWICE DAILY   No facility-administered encounter medications on file as of 10/04/2017.     Review of Systems  Constitutional: Negative for appetite change and unexpected weight change.  HENT: Negative for congestion and sinus pressure.   Respiratory: Negative for cough and chest tightness.        Breathing stable   Cardiovascular: Negative for chest pain and palpitations.       No increased leg swelling.    Gastrointestinal: Negative for abdominal pain, diarrhea, nausea and vomiting.  Genitourinary: Negative for difficulty urinating and dysuria.  Musculoskeletal: Negative for joint swelling and myalgias.  Skin: Negative for color change and rash.  Neurological: Negative for dizziness, light-headedness and headaches.  Psychiatric/Behavioral: Negative for agitation and dysphoric mood.       Objective:    Physical Exam  Constitutional: She appears well-developed and well-nourished. No distress.  HENT:  Nose: Nose normal.  Mouth/Throat: Oropharynx is clear and moist.  Neck: Neck supple. No thyromegaly present.  Cardiovascular: Normal rate.  Pulmonary/Chest: Breath sounds normal. No respiratory distress. She has no wheezes.  Abdominal: Soft. Bowel sounds are normal. There is no tenderness.  Musculoskeletal: She exhibits no tenderness.  No increased edema.  Feet - no lesions.  Some decreased sensation - toes.    Lymphadenopathy:    She has no cervical adenopathy.  Skin: No rash noted. No erythema.  Psychiatric: She has a normal mood and affect. Her behavior is normal.    BP 140/78 (BP Location: Left Arm, Patient Position: Sitting, Cuff Size: Large)   Pulse (!) 59   Temp 98.1 F (36.7 C) (Oral)   Resp 20   Wt (!) 303 lb (137.4 kg)   SpO2 97%   BMI 53.67 kg/m  Wt Readings from Last 3 Encounters:  10/04/17 (!) 303 lb (137.4 kg)  04/25/17 (!) 308 lb (139.7 kg)  12/24/16 (!) 301 lb 8 oz (136.8 kg)     Lab Results  Component Value Date   WBC 9.0 05/02/2017   HGB  12.6 05/02/2017   HCT 37.6 05/02/2017   PLT 247.0 05/02/2017   GLUCOSE 117 (H) 10/04/2017   CHOL 173 10/04/2017   TRIG 218.0 (H) 10/04/2017   HDL 43.60 10/04/2017   LDLDIRECT 102.0 10/04/2017   LDLCALC 75 05/02/2017   ALT 26 10/04/2017   AST 19 10/04/2017   NA 140 10/04/2017   K 4.1 10/04/2017   CL 102 10/04/2017   CREATININE 0.72 10/04/2017   BUN 22 10/04/2017   CO2 29 10/04/2017   TSH 1.15 05/02/2017   INR 0.99 11/15/2012   HGBA1C 7.8 (H) 10/04/2017   MICROALBUR 2.6 (H) 10/04/2017  Mm Digital Diagnostic Unilat L  Result Date: 05/28/2017 CLINICAL DATA:  Recall from screening for left breast calcifications. EXAM: DIGITAL DIAGNOSTIC LEFT MAMMOGRAM COMPARISON:  Previous exam(s). ACR Breast Density Category b: There are scattered areas of fibroglandular density. FINDINGS: In the left breast, lower outer quadrant, middle depth, there is a group of 4 coarse and several punctate calcifications spanning 2 mm, curvilinear arranged with no associated mass. There are several others scattered punctate and coarse calcifications elsewhere in the left breast that are stable. IMPRESSION: Probably benign left breast calcifications. Short-term follow-up was recommended. RECOMMENDATION: Diagnostic left breast mammography with magnification views in 6 months. I have discussed the findings and recommendations with the patient. Results were also provided in writing at the conclusion of the visit. If applicable, a reminder letter will be sent to the patient regarding the next appointment. BI-RADS CATEGORY  3: Probably benign. Electronically Signed   By: Lajean Manes M.D.   On: 05/28/2017 15:05       Assessment & Plan:   Problem List Items Addressed This Visit    Atrial fibrillation ([place]) - Primary    Followed by Dr Ubaldo Glassing.  In SR.  On aspirin.  Stable.       BMI 50.0-59.9, adult (HCC)    Discussed diet and exercise.  Follow.       Diabetes mellitus ([place])    Low carb diet and exercise.  Discussed  importance of diet and exercise.  Follow met b and a1c.  Discussed the need for eye exam.  Due.       Relevant Orders   Hemoglobin A1c (Completed)   Basic metabolic panel (Completed)   Microalbumin / creatinine urine ratio (Completed)   GERD (gastroesophageal reflux disease)    On omeprazole.  Controlled.        Hypercholesteremia    Low cholesterol diet and exercise.  Follow lipid panel.        Relevant Orders   Hepatic function panel (Completed)   Lipid panel (Completed)   Hypertension    Blood pressure has been under good control.  Same medication regimen.  Have her spot check her pressure.  Follow.        Obstructive sleep apnea    CPAP.           Einar Pheasant, MD

## 2017-10-06 ENCOUNTER — Encounter: Payer: Self-pay | Admitting: Internal Medicine

## 2017-10-06 NOTE — Assessment & Plan Note (Signed)
Followed by Dr Lady GaryFath.  In SR.  On aspirin.  Stable.

## 2017-10-06 NOTE — Assessment & Plan Note (Signed)
Blood pressure has been under good control.  Same medication regimen.  Have her spot check her pressure.  Follow.

## 2017-10-06 NOTE — Assessment & Plan Note (Addendum)
Low carb diet and exercise.  Discussed importance of diet and exercise.  Follow met b and a1c.  Discussed the need for eye exam.  Due.

## 2017-10-06 NOTE — Assessment & Plan Note (Signed)
CPAP.  

## 2017-10-06 NOTE — Assessment & Plan Note (Signed)
Low cholesterol diet and exercise.  Follow lipid panel.   

## 2017-10-06 NOTE — Assessment & Plan Note (Signed)
On omeprazole.  Controlled.   

## 2017-10-06 NOTE — Assessment & Plan Note (Signed)
Discussed diet and exercise.  Follow.  

## 2017-10-10 ENCOUNTER — Other Ambulatory Visit: Payer: Self-pay | Admitting: Internal Medicine

## 2017-10-10 DIAGNOSIS — E118 Type 2 diabetes mellitus with unspecified complications: Secondary | ICD-10-CM

## 2017-10-10 MED ORDER — ROSUVASTATIN CALCIUM 10 MG PO TABS: 10.0000 mg | ORAL_TABLET | Freq: Every day | ORAL | 2 refills | Status: DC

## 2017-10-10 NOTE — Progress Notes (Signed)
Order placed for endocrinology referral.  

## 2017-10-10 NOTE — Progress Notes (Signed)
rx sent in for crestor 10mg  q day #30 with 2 refills.

## 2017-10-11 ENCOUNTER — Other Ambulatory Visit: Payer: Self-pay

## 2017-10-14 ENCOUNTER — Other Ambulatory Visit: Payer: Self-pay | Admitting: Internal Medicine

## 2017-11-19 ENCOUNTER — Other Ambulatory Visit: Payer: Self-pay | Admitting: Internal Medicine

## 2017-11-21 ENCOUNTER — Other Ambulatory Visit: Payer: Managed Care, Other (non HMO)

## 2017-12-11 ENCOUNTER — Ambulatory Visit: Payer: Self-pay | Admitting: *Deleted

## 2017-12-11 NOTE — Telephone Encounter (Signed)
Pt reports bilateral leg edema, pain and redness x 2 days. States "Legs are always pinkish but not like this." Pt states redness is bright red, mostly in front of legs. States moderate edema from knees to ankles, left > right and greater at calves.  Pain is constant 4/10 "mostly in front of legs."  Also states left leg is more painful, warm to touch and redness greater in left leg. Reports she has been sedate lately "Not moving around much last few weeks" as she had a URI. Denies fever, states some SOB but still has URI. Pt also states "I feel like something is cutting off my blood behind my legs at calf, left worse than right." Pt directed to ED. Pt states [facility]. Made aware they may direct her to ED.    Reason for Disposition . [1] Thigh, calf, or ankle swelling AND [2] bilateral AND [3] 1 side is more swollen  Answer Assessment - Initial Assessment Questions 1. ONSET: "When did the swelling start?" (e.g., minutes, hours, days)     Worsened last 2 days 2. LOCATION: "What part of the leg is swollen?"  "Are both legs swollen or just one leg?"     Both, red and painful, warm to touch 3. SEVERITY: "How bad is the swelling?" (e.g., localized; mild, moderate, severe)  - Localized - small area of swelling localized to one leg  - MILD pedal edema - swelling limited to foot and ankle, pitting edema < 1/4 inch (6 mm) deep, rest and elevation eliminate most or all swelling  - MODERATE edema - swelling of lower leg to knee, pitting edema > 1/4 inch (6 mm) deep, rest and elevation only partially reduce swelling  - SEVERE edema - swelling extends above knee, facial or hand swelling present       moderate 4. REDNESS: "Does the swelling look red or infected?"     Bright red 5. PAIN: "Is the swelling painful to touch?" If so, ask: "How painful is it?"   (Scale 1-10; mild, moderate or severe)     4/10, constant in front 6. FEVER: "Do you have a fever?" If so, ask: "What is it, how was it  measured, and when did it start?"      No; did have URI 7. CAUSE: "What do you think is causing the leg swelling?"     unsure 8. MEDICAL HISTORY: "Do you have a history of heart failure, kidney disease, liver failure, or cancer?"     DM, Afib 9. RECURRENT SYMPTOM: "Have you had leg swelling before?" If so, ask: "When was the last time?" "What happened that time?"     no 10. OTHER SYMPTOMS: "Do you have any other symptoms?" (e.g., chest pain, difficulty breathing)       URI past few days 11. PREGNANCY: "Is there any chance you are pregnant?" "When was your last menstrual period?"       no  Protocols used: LEG SWELLING AND EDEMA-A-AH

## 2017-12-11 NOTE — Telephone Encounter (Signed)
[facility] going [facility].

## 2017-12-12 ENCOUNTER — Encounter: Payer: Self-pay | Admitting: Family Medicine

## 2017-12-12 ENCOUNTER — Ambulatory Visit (INDEPENDENT_AMBULATORY_CARE_PROVIDER_SITE_OTHER): Payer: Self-pay | Admitting: Family Medicine

## 2017-12-12 VITALS — BP 130/80 | HR 67 | Temp 98.9°F | Resp 16 | Wt 287.6 lb

## 2017-12-12 DIAGNOSIS — M7989 Other specified soft tissue disorders: Secondary | ICD-10-CM

## 2017-12-12 DIAGNOSIS — M79662 Pain in left lower leg: Secondary | ICD-10-CM

## 2017-12-12 NOTE — Telephone Encounter (Signed)
Reviewed.  Need to confirm pt was evaluated and doing ok.

## 2017-12-12 NOTE — Progress Notes (Signed)
Subjective:    Patient ID: Christine Robertson, female    DOB: Dec 25, 1955, 62 y.o.   MRN: [id number]  HPI  Christine Robertson is a 62 year old female who presents today with left leg/calf pain, swelling, and erythema that has been present for 4 days. Associated erythema of right leg is also present however she states that left has been worse than right and is causing her pain.  She contacted a nurse who triaged her yesterday with bilateral leg edema, pain and redness x 2 days. She reported that while her legs were always "pinkish" that her symptoms were worse and noted as bright red. She also reported pain that was occurring in the front of her legs and left was more painful and warm to touch. She also reported "not moving around much in the last few weeks" She was advised to go to the ED for further evaluation however she did not go for evaluation.  Today, she reports pain in her left calf that has been present for approximately 4 days with erythema. Pain is noted as 3 but has increased to a 4.  She reports erythema can worsen and has felt warmth at times. She has a history of mild erythema however this is worse and she reports swelling that worsened and describes this as "choking" her lower leg.   History of  A-fib with history of blood thinner use however she is no longer on this medication. On 01/17/17 she was evaluated by cardiology for follow up care. Cardiology noted that she underwent an Echocardiogram showing preserved LV function with no significant structural valvular abnormalities. Her Holter monitor revealed sinus rhythm with PACs but no sustained or intermittent atrial fib. She takes flecainide and metoprolol.   She has a history of HTN, A-fibrillation, OSA, morbid obesity,  and DM. She had blood work completed 10/04/17 which indicated A1C 7.8 and elevated cholesterol. She has been referred to endocrinology and advised to start crestor by her PCP.   Review of Systems  Constitutional: Negative for  chills, fatigue and fever.  Respiratory: Negative for cough, shortness of breath and wheezing.   Cardiovascular: Negative for chest pain and palpitations.       Left lower leg swelling with pain and redness  Gastrointestinal: Negative for abdominal pain, diarrhea, nausea and vomiting.  Musculoskeletal: Negative for back pain.  Skin: Negative for rash.  Neurological: Negative for dizziness, weakness, light-headedness, numbness and headaches.   Past Medical History:  Diagnosis Date  . Abnormal results of thyroid function studies   . Allergy    hay fever  . Angina pectoris, unspecified (HCC)   . Arthritis   . Atrial fibrillation, unspecified   . Chicken pox   . Cold hands   . Congenital anomaly of adrenal gland   . Diabetes mellitus    type 2, uncomplicated  . Essential hypertension, benign   . GERD (gastroesophageal reflux disease)   . IBS (irritable bowel syndrome)   . Malaise   . Morbid obesity (HCC)   . Murmur   . Pain in soft tissues of limb   . Proteinuria   . Pure hypercholesterolemia   . Rash and other nonspecific skin eruption   . Sleep apnea    Obstructive; Uses C-Pap machine  . Urinary incontinence      Social History   Socioeconomic History  . Marital status: Married    Spouse name: Not on file  . Number of children: Not on file  . Years of  education: Not on file  . Highest education level: Not on file  Occupational History  . Not on file  Social Needs  . Financial resource strain: Not on file  . Food insecurity:    Worry: Not on file    Inability: Not on file  . Transportation needs:    Medical: Not on file    Non-medical: Not on file  Tobacco Use  . Smoking status: Never Smoker  . Smokeless tobacco: Never Used  Substance and Sexual Activity  . Alcohol use: No    Alcohol/week: 0.0 oz  . Drug use: No  . Sexual activity: Not on file  Lifestyle  . Physical activity:    Days per week: Not on file    Minutes per session: Not on file  . Stress:  Not on file  Relationships  . Social connections:    Talks on phone: Not on file    Gets together: Not on file    Attends religious service: Not on file    Active member of club or organization: Not on file    Attends meetings of clubs or organizations: Not on file    Relationship status: Not on file  . Intimate partner violence:    Fear of current or ex partner: Not on file    Emotionally abused: Not on file    Physically abused: Not on file    Forced sexual activity: Not on file  Other Topics Concern  . Not on file  Social History Narrative  . Not on file    Past Surgical History:  Procedure Laterality Date  . ABDOMINAL HYSTERECTOMY  2010  . BLADDER SURGERY  1967   stretching of bladder  . BREAST BIOPSY Left 03/28/2015   done at Dr. Rutherford NailByrnett's office. fibrocystic changes  . COLONOSCOPY    . COLONOSCOPY WITH PROPOFOL N/A 09/09/2015   Procedure: COLONOSCOPY WITH PROPOFOL;  Surgeon: Scot Junobert T Elliott, MD;  Location: [facility] ENDOSCOPY;  Service: Endoscopy;  Laterality: N/A;  . ESOPHAGOGASTRODUODENOSCOPY    . TONSILLECTOMY AND ADENOIDECTOMY  1959  . URETHRAL DILATION      Family History  Problem Relation Age of Onset  . Diabetes Mother   . Cancer Father        lung  . Breast cancer Maternal Grandmother 60  . Breast cancer Maternal Aunt 65    Allergies  Allergen Reactions  . Augmentin [Amoxicillin-Pot Clavulanate] Hives  . Demerol [Meperidine] Nausea Only  . Erythromycin Hives    Current Outpatient Medications on File Prior to Visit  Medication Sig Dispense Refill  . aspirin 81 MG tablet Take 81 mg by mouth daily.    . beclomethasone (QVAR) 40 MCG/ACT inhaler Inhale 1 puff into the lungs 2 (two) times daily.    . cetirizine (ZYRTEC) 10 MG tablet Take 10 mg by mouth daily.    . clotrimazole-betamethasone (LOTRISONE) cream Apply 1 application topically 2 (two) times daily. 30 g 0  . flecainide (TAMBOCOR) 100 MG tablet TAKE 1 TABLET BY MOUTH TWICE DAILY    . glimepiride  (AMARYL) 2 MG tablet TAKE 1 TABLET BY MOUTH TWICE DAILY 60 tablet 3  . hydrochlorothiazide (HYDRODIURIL) 25 MG tablet TAKE 1 TABLET BY MOUTH ONCE DAILY 90 tablet 1  . lisinopril (PRINIVIL,ZESTRIL) 10 MG tablet TAKE 1 TABLET BY MOUTH ONCE DAILY 30 tablet 5  . metFORMIN (GLUCOPHAGE) 1000 MG tablet TAKE 1 TABLET BY MOUTH TWICE DAILY WITH MEALS 60 tablet 4  . metoprolol tartrate (LOPRESSOR) 25 MG tablet Take  25 mg by mouth 2 (two) times daily.    . Multiple Vitamin (MULTIVITAMIN) tablet Take 1 tablet by mouth daily.    Marland Kitchen omeprazole (PRILOSEC) 20 MG capsule TAKE 1 CAPSULE BY MOUTH TWICE DAILY (Patient taking differently: TAKE 1 CAPSULE BY MOUTH  DAILY) 60 capsule 5  . oxybutynin (DITROPAN) 5 MG tablet TAKE 1 TABLET BY MOUTH TWICE DAILY 180 tablet 0  . budesonide (PULMICORT FLEXHALER) 180 MCG/ACT inhaler Inhale 2 puffs into the lungs 2 (two) times daily. (Patient not taking: Reported on 12/12/2017) 1 Inhaler 3  . fluticasone (FLOVENT HFA) 110 MCG/ACT inhaler Inhale 2 puffs into the lungs 2 (two) times daily. (Patient not taking: Reported on 12/12/2017) 1 Inhaler 2  . rosuvastatin (CRESTOR) 10 MG tablet Take 1 tablet (10 mg total) by mouth daily. (Patient not taking: Reported on 12/12/2017) 30 tablet 2   No current facility-administered medications on file prior to visit.     BP 130/80 (BP Location: Left Arm, Patient Position: Sitting, Cuff Size: Large)   Pulse 67   Temp 98.9 F (37.2 C) (Oral)   Resp 16   Wt 287 lb 9 oz (130.4 kg)   SpO2 98%   BMI 50.94 kg/m        Objective:   Physical Exam  Constitutional: She is oriented to person, place, and time. She appears well-developed and well-nourished.  Morbidly obese  Eyes: No scleral icterus.  Neck: Neck supple.  Cardiovascular: Normal rate and regular rhythm.  Pulmonary/Chest: Effort normal and breath sounds normal. She has no wheezes. She has no rales.  Abdominal: Soft. Bowel sounds are normal. There is no tenderness.  Musculoskeletal:    Lower extremities challenging to assess with fat distribution pattern of significantly larger calfs than ankles.  Left lower extremity with erythema, warmth, and edema and is larger than right lower extremity which exhibits mild erythema. Pain is noted with palpation of left calf.      Lymphadenopathy:    She has no cervical adenopathy.  Neurological: She is alert and oriented to person, place, and time.  Skin: Skin is warm and dry. No rash noted.  Psychiatric: She has a normal mood and affect. Her behavior is normal. Judgment and thought content normal.      Assessment & Plan:  1. Pain and swelling of left lower leg Pain in calf with increasing erythema, warmth, edema in lower extremity with obesity  is concerning. Edema with description of feeling of "choking" in her leg is also of concern for DVT.  Wells criteria: 3 (local tenderness, swelling, and pitting edema). Morbid obesity and shape of lower legs make assessment challenging and we discussed that imaging by [place] is recommended. With these symptoms, presentation that is challenging to determine source of swelling; advised patient to seek evaluation at the ED which was also suggested to her yesterday through triage. She agreed to seek care at this time in the ED as her symptoms are not improving.  Roddie Mc, FNP-C

## 2017-12-12 NOTE — Telephone Encounter (Signed)
[facility], scheduled with NP for 3:15 this afternoon for left leg swelling and redness, has always had bilateral leg swelling but left leg has become larger than normal with redness.

## 2017-12-29 ENCOUNTER — Other Ambulatory Visit: Payer: Self-pay | Admitting: Internal Medicine

## 2018-01-14 LAB — HM DIABETES EYE EXAM

## 2018-01-16 ENCOUNTER — Ambulatory Visit (INDEPENDENT_AMBULATORY_CARE_PROVIDER_SITE_OTHER): Payer: Self-pay | Admitting: Internal Medicine

## 2018-01-16 DIAGNOSIS — M7989 Other specified soft tissue disorders: Secondary | ICD-10-CM

## 2018-01-16 DIAGNOSIS — G4733 Obstructive sleep apnea (adult) (pediatric): Secondary | ICD-10-CM

## 2018-01-16 DIAGNOSIS — E118 Type 2 diabetes mellitus with unspecified complications: Secondary | ICD-10-CM

## 2018-01-16 DIAGNOSIS — K219 Gastro-esophageal reflux disease without esophagitis: Secondary | ICD-10-CM

## 2018-01-16 DIAGNOSIS — I4891 Unspecified atrial fibrillation: Secondary | ICD-10-CM

## 2018-01-16 DIAGNOSIS — R928 Other abnormal and inconclusive findings on diagnostic imaging of breast: Secondary | ICD-10-CM

## 2018-01-16 DIAGNOSIS — E7800 Pure hypercholesterolemia, unspecified: Secondary | ICD-10-CM

## 2018-01-16 DIAGNOSIS — Z6841 Body Mass Index (BMI) 40.0 and over, adult: Secondary | ICD-10-CM

## 2018-01-16 DIAGNOSIS — I10 Essential (primary) hypertension: Secondary | ICD-10-CM

## 2018-01-16 MED ORDER — TRIAMCINOLONE ACETONIDE 0.1 % EX CREA: 1.0000 "application " | TOPICAL_CREAM | Freq: Two times a day (BID) | CUTANEOUS | 0 refills | Status: DC

## 2018-01-16 NOTE — Progress Notes (Signed)
Patient ID: Christine Robertson, female   DOB: 12/06/1955, 62 y.o.   MRN: [id number]   Subjective:    Patient ID: Christine Robertson, female    DOB: 06/25/1956, 62 y.o.   MRN: [id number]  HPI  Patient here for a scheduled physical.  Had several concerns, so elected to postpone physical.  She has been having problems with lower extremity swelling and redness.  Was seen 12/12/17 by Roddie Mc.  Referred to ER.  Ultrasound negative for DVT. Redness.  Persistent swelling.  She saw endocrinology 12/11/17.  Recommended continuing metformin and glimepiride.  Discussed diet and exercise.  Has f/u planned with endocrinology.  She also saw cardiology.  Stable.  No chest pain. Breathing stable.  No acid reflux.  No abdominal pain.  Bowels moving.    Past Medical History:  Diagnosis Date  . Abnormal results of thyroid function studies   . Allergy    hay fever  . Angina pectoris, unspecified (HCC)   . Arthritis   . Atrial fibrillation, unspecified   . Chicken pox   . Cold hands   . Congenital anomaly of adrenal gland   . Diabetes mellitus    type 2, uncomplicated  . Essential hypertension, benign   . GERD (gastroesophageal reflux disease)   . IBS (irritable bowel syndrome)   . Malaise   . Morbid obesity (HCC)   . Murmur   . Pain in soft tissues of limb   . Proteinuria   . Pure hypercholesterolemia   . Rash and other nonspecific skin eruption   . Sleep apnea    Obstructive; Uses C-Pap machine  . Urinary incontinence    Past Surgical History:  Procedure Laterality Date  . ABDOMINAL HYSTERECTOMY  2010  . BLADDER SURGERY  1967   stretching of bladder  . BREAST BIOPSY Left 03/28/2015   done at Dr. Rutherford Nail office. fibrocystic changes  . COLONOSCOPY    . COLONOSCOPY WITH PROPOFOL N/A 09/09/2015   Procedure: COLONOSCOPY WITH PROPOFOL;  Surgeon: Scot Jun, MD;  Location: [facility] ENDOSCOPY;  Service: Endoscopy;  Laterality: N/A;  . ESOPHAGOGASTRODUODENOSCOPY    . TONSILLECTOMY AND  ADENOIDECTOMY  1959  . URETHRAL DILATION     Family History  Problem Relation Age of Onset  . Diabetes Mother   . Cancer Father        lung  . Breast cancer Maternal Grandmother 60  . Breast cancer Maternal Aunt 70   Social History   Socioeconomic History  . Marital status: Married    Spouse name: Not on file  . Number of children: Not on file  . Years of education: Not on file  . Highest education level: Not on file  Occupational History  . Not on file  Social Needs  . Financial resource strain: Not on file  . Food insecurity:    Worry: Not on file    Inability: Not on file  . Transportation needs:    Medical: Not on file    Non-medical: Not on file  Tobacco Use  . Smoking status: Never Smoker  . Smokeless tobacco: Never Used  Substance and Sexual Activity  . Alcohol use: No    Alcohol/week: 0.0 oz  . Drug use: No  . Sexual activity: Not on file  Lifestyle  . Physical activity:    Days per week: Not on file    Minutes per session: Not on file  . Stress: Not on file  Relationships  . Social connections:  Talks on phone: Not on file    Gets together: Not on file    Attends religious service: Not on file    Active member of club or organization: Not on file    Attends meetings of clubs or organizations: Not on file    Relationship status: Not on file  Other Topics Concern  . Not on file  Social History Narrative  . Not on file    Outpatient Encounter Medications as of 01/16/2018  Medication Sig  . aspirin 81 MG tablet Take 81 mg by mouth daily.  . beclomethasone (QVAR) 40 MCG/ACT inhaler Inhale 1 puff into the lungs 2 (two) times daily.  . cetirizine (ZYRTEC) 10 MG tablet Take 10 mg by mouth daily.  . clotrimazole-betamethasone (LOTRISONE) cream Apply 1 application topically 2 (two) times daily.  . flecainide (TAMBOCOR) 100 MG tablet TAKE 1 TABLET BY MOUTH TWICE DAILY  . hydrochlorothiazide (HYDRODIURIL) 25 MG tablet TAKE 1 TABLET BY MOUTH ONCE DAILY  .  lisinopril (PRINIVIL,ZESTRIL) 10 MG tablet TAKE 1 TABLET BY MOUTH ONCE DAILY  . metoprolol tartrate (LOPRESSOR) 25 MG tablet Take 25 mg by mouth 2 (two) times daily.  . Multiple Vitamin (MULTIVITAMIN) tablet Take 1 tablet by mouth daily.  Marland Kitchen omeprazole (PRILOSEC) 20 MG capsule TAKE 1 CAPSULE BY MOUTH TWICE DAILY (Patient taking differently: TAKE 1 CAPSULE BY MOUTH  DAILY)  . oxybutynin (DITROPAN) 5 MG tablet TAKE 1 TABLET BY MOUTH TWICE DAILY  . rosuvastatin (CRESTOR) 10 MG tablet Take 1 tablet (10 mg total) by mouth daily. (Patient not taking: Reported on 01/23/2018)  . triamcinolone cream (KENALOG) 0.1 % Apply 1 application topically 2 (two) times daily.  . [DISCONTINUED] budesonide (PULMICORT FLEXHALER) 180 MCG/ACT inhaler Inhale 2 puffs into the lungs 2 (two) times daily. (Patient not taking: Reported on 12/12/2017)  . [DISCONTINUED] fluticasone (FLOVENT HFA) 110 MCG/ACT inhaler Inhale 2 puffs into the lungs 2 (two) times daily. (Patient not taking: Reported on 12/12/2017)  . [DISCONTINUED] glimepiride (AMARYL) 2 MG tablet TAKE 1 TABLET BY MOUTH TWICE DAILY  . [DISCONTINUED] metFORMIN (GLUCOPHAGE) 1000 MG tablet TAKE 1 TABLET BY MOUTH TWICE DAILY WITH MEALS   No facility-administered encounter medications on file as of 01/16/2018.     Review of Systems  Constitutional: Negative for appetite change and unexpected weight change.  HENT: Negative for congestion and sinus pressure.   Respiratory: Negative for cough, chest tightness and shortness of breath.   Cardiovascular: Positive for leg swelling. Negative for chest pain and palpitations.  Gastrointestinal: Negative for abdominal pain, diarrhea, nausea and vomiting.  Genitourinary: Negative for difficulty urinating and dysuria.  Musculoskeletal: Negative for joint swelling and myalgias.  Skin: Negative for color change and rash.  Neurological: Negative for dizziness, light-headedness and headaches.  Psychiatric/Behavioral: Negative for  agitation and dysphoric mood.       Objective:    Physical Exam  Constitutional: She appears well-developed and well-nourished. No distress.  HENT:  Nose: Nose normal.  Mouth/Throat: Oropharynx is clear and moist.  Neck: Neck supple. No thyromegaly present.  Cardiovascular: Normal rate and regular rhythm.  Pulmonary/Chest: Breath sounds normal. No respiratory distress. She has no wheezes.  Abdominal: Soft. Bowel sounds are normal. There is no tenderness.  Musculoskeletal: She exhibits edema. She exhibits no tenderness.  Increased erythema lower extremities.  Lower extremity swelling - mid calf.  Feet:  No lesions.  DP pulses palpable and equal bilaterally.  Intact to light touch.   Lymphadenopathy:    She has no cervical  adenopathy.  Skin: No rash noted. There is erythema.  Psychiatric: She has a normal mood and affect. Her behavior is normal.    BP 136/78 (BP Location: Left Arm, Patient Position: Sitting, Cuff Size: Large)   Pulse 68   Temp 97.8 F (36.6 C) (Oral)   Resp 18   Ht  (1.6 m)   Wt 295 lb 3.2 oz (133.9 kg)   SpO2 96%   BMI 52.29 kg/m  Wt Readings from Last 3 Encounters:  01/23/18 290 lb (131.5 kg)  01/16/18 295 lb 3.2 oz (133.9 kg)  12/12/17 287 lb 9 oz (130.4 kg)     Lab Results  Component Value Date   WBC 9.0 05/02/2017   HGB 12.6 05/02/2017   HCT 37.6 05/02/2017   PLT 247.0 05/02/2017   GLUCOSE 117 (H) 10/04/2017   CHOL 173 10/04/2017   TRIG 218.0 (H) 10/04/2017   HDL 43.60 10/04/2017   LDLDIRECT 102.0 10/04/2017   LDLCALC 75 05/02/2017   ALT 26 10/04/2017   AST 19 10/04/2017   NA 140 10/04/2017   K 4.1 10/04/2017   CL 102 10/04/2017   CREATININE 0.72 10/04/2017   BUN 22 10/04/2017   CO2 29 10/04/2017   TSH 1.15 05/02/2017   INR 0.99 11/15/2012   HGBA1C 7.8 (H) 10/04/2017   MICROALBUR 2.6 (H) 10/04/2017    Mm Digital Diagnostic Unilat L  Result Date: 05/28/2017 CLINICAL DATA:  Recall from screening for left breast calcifications.  EXAM: DIGITAL DIAGNOSTIC LEFT MAMMOGRAM COMPARISON:  Previous exam(s). ACR Breast Density Category b: There are scattered areas of fibroglandular density. FINDINGS: In the left breast, lower outer quadrant, middle depth, there is a group of 4 coarse and several punctate calcifications spanning 2 mm, curvilinear arranged with no associated mass. There are several others scattered punctate and coarse calcifications elsewhere in the left breast that are stable. IMPRESSION: Probably benign left breast calcifications. Short-term follow-up was recommended. RECOMMENDATION: Diagnostic left breast mammography with magnification views in 6 months. I have discussed the findings and recommendations with the patient. Results were also provided in writing at the conclusion of the visit. If applicable, a reminder letter will be sent to the patient regarding the next appointment. BI-RADS CATEGORY  3: Probably benign. Electronically Signed   By: Amie Portland M.D.   On: 05/28/2017 15:05       Assessment & Plan:   Problem List Items Addressed This Visit    Abnormal mammogram of left breast    S/p breast biopsy x 2.  Saw Dr Lemar Livings previously.  Recent mammogram recommended f/u mammogram in 6 months.  Scheduled for 02/2018.  Follow.       Atrial fibrillation (HCC)    Followed by Dr Lady Gary.  In SR.  On aspirin.  Stable.       BMI 50.0-59.9, adult (HCC)    Discussed diet and exercise.  Follow.        Diabetes mellitus (HCC)    Low carb diet and exercise.  Discussed weight loss.  Seeing endocrinology.        GERD (gastroesophageal reflux disease)    Controlled on current regimen.        Hypercholesteremia    Low cholesterol diet and exercise.  Follow lipid panel.        Hypertension    Blood pressure under good control.  Continue same medication regimen.  Follow pressures.  Follow metabolic panel.        Obstructive sleep apnea  CPAP.       Swelling of lower extremity    Persistent lower extremity  swelling.  Redness.  Recent ultrasound negative for DVT.  Triamcinolone Cream as directed.  Elevate legs.  Discussed compression hose.  Refer to vascular surgery for further evaluation.        Relevant Orders   Ambulatory referral to Vascular Surgery       Dale Winchester, MD

## 2018-01-17 ENCOUNTER — Other Ambulatory Visit: Payer: Self-pay | Admitting: Internal Medicine

## 2018-01-20 ENCOUNTER — Other Ambulatory Visit: Payer: Self-pay

## 2018-01-20 MED ORDER — GLIMEPIRIDE 2 MG PO TABS: 2.0000 mg | ORAL_TABLET | Freq: Two times a day (BID) | ORAL | 1 refills | Status: DC

## 2018-01-22 ENCOUNTER — Encounter: Payer: Self-pay | Admitting: Internal Medicine

## 2018-01-23 ENCOUNTER — Encounter (INDEPENDENT_AMBULATORY_CARE_PROVIDER_SITE_OTHER): Payer: Self-pay | Admitting: Vascular Surgery

## 2018-01-23 ENCOUNTER — Ambulatory Visit (INDEPENDENT_AMBULATORY_CARE_PROVIDER_SITE_OTHER): Payer: Self-pay | Admitting: Vascular Surgery

## 2018-01-23 VITALS — BP 154/87 | HR 68 | Resp 19 | Ht 63.0 in | Wt 290.0 lb

## 2018-01-23 DIAGNOSIS — I890 Lymphedema, not elsewhere classified: Secondary | ICD-10-CM

## 2018-01-23 DIAGNOSIS — I10 Essential (primary) hypertension: Secondary | ICD-10-CM

## 2018-01-23 DIAGNOSIS — G4733 Obstructive sleep apnea (adult) (pediatric): Secondary | ICD-10-CM

## 2018-01-23 DIAGNOSIS — I872 Venous insufficiency (chronic) (peripheral): Secondary | ICD-10-CM

## 2018-01-23 DIAGNOSIS — I4891 Unspecified atrial fibrillation: Secondary | ICD-10-CM

## 2018-01-23 NOTE — Progress Notes (Signed)
MRN : [id number]  Christine Robertson is a 62 y.o. (06/04/1956) female who presents with chief complaint of  Chief Complaint  Patient presents with  . New Patient (Initial Visit)    Severe swelling  .  History of Present Illness: Patient is seen for evaluation of leg pain and leg swelling. The patient first noticed the swelling remotely. The swelling is associated with pain and discoloration. The pain and swelling worsens with prolonged dependency and improves with elevation. The pain is unrelated to activity.  The patient notes that in the morning the legs are significantly improved but they steadily worsened throughout the course of the day. The patient also notes a steady worsening of the discoloration in the ankle and shin area.   The patient denies claudication symptoms.  The patient denies symptoms consistent with rest pain.  The patient denies and extensive history of DJD and LS spine disease.  The patient has no had any past angiography, interventions or vascular surgery.  Elevation makes the leg symptoms better, dependency makes them much worse. There is no history of ulcerations. The patient denies any recent changes in medications.  The patient has not been wearing graduated compression.  The patient denies a history of DVT or PE. There is no prior history of phlebitis. There is no history of primary lymphedema.  No history of malignancies. No history of trauma or groin or pelvic surgery. There is no history of radiation treatment to the groin or pelvis  The patient denies amaurosis fugax or recent TIA symptoms. There are no recent neurological changes noted. The patient denies recent episodes of angina or shortness of breath  Venous duplex is negative  Current Meds  Medication Sig  . aspirin 81 MG tablet Take 81 mg by mouth daily.  . beclomethasone (QVAR) 40 MCG/ACT inhaler Inhale 1 puff into the lungs 2 (two) times daily.  . cetirizine (ZYRTEC) 10 MG tablet Take 10 mg by  mouth daily.  . clotrimazole-betamethasone (LOTRISONE) cream Apply 1 application topically 2 (two) times daily.  . flecainide (TAMBOCOR) 100 MG tablet TAKE 1 TABLET BY MOUTH TWICE DAILY  . glimepiride (AMARYL) 2 MG tablet Take 1 tablet (2 mg total) by mouth 2 (two) times daily.  . hydrochlorothiazide (HYDRODIURIL) 25 MG tablet TAKE 1 TABLET BY MOUTH ONCE DAILY  . lisinopril (PRINIVIL,ZESTRIL) 10 MG tablet TAKE 1 TABLET BY MOUTH ONCE DAILY  . metFORMIN (GLUCOPHAGE) 1000 MG tablet TAKE 1 TABLET BY MOUTH TWICE DAILY WITH MEALS  . metoprolol tartrate (LOPRESSOR) 25 MG tablet Take 25 mg by mouth 2 (two) times daily.  . Multiple Vitamin (MULTIVITAMIN) tablet Take 1 tablet by mouth daily.  Marland Kitchen omeprazole (PRILOSEC) 20 MG capsule TAKE 1 CAPSULE BY MOUTH TWICE DAILY (Patient taking differently: TAKE 1 CAPSULE BY MOUTH  DAILY)  . oxybutynin (DITROPAN) 5 MG tablet TAKE 1 TABLET BY MOUTH TWICE DAILY  . triamcinolone cream (KENALOG) 0.1 % Apply 1 application topically 2 (two) times daily.    Past Medical History:  Diagnosis Date  . Abnormal results of thyroid function studies   . Allergy    hay fever  . Angina pectoris, unspecified (HCC)   . Arthritis   . Atrial fibrillation, unspecified   . Chicken pox   . Cold hands   . Congenital anomaly of adrenal gland   . Diabetes mellitus    type 2, uncomplicated  . Essential hypertension, benign   . GERD (gastroesophageal reflux disease)   . IBS (irritable bowel syndrome)   .  Malaise   . Morbid obesity (HCC)   . Murmur   . Pain in soft tissues of limb   . Proteinuria   . Pure hypercholesterolemia   . Rash and other nonspecific skin eruption   . Sleep apnea    Obstructive; Uses C-Pap machine  . Urinary incontinence     Past Surgical History:  Procedure Laterality Date  . ABDOMINAL HYSTERECTOMY  2010  . BLADDER SURGERY  1967   stretching of bladder  . BREAST BIOPSY Left 03/28/2015   done at Dr. Rutherford Nail office. fibrocystic changes  .  COLONOSCOPY    . COLONOSCOPY WITH PROPOFOL N/A 09/09/2015   Procedure: COLONOSCOPY WITH PROPOFOL;  Surgeon: Scot Jun, MD;  Location: [facility] ENDOSCOPY;  Service: Endoscopy;  Laterality: N/A;  . ESOPHAGOGASTRODUODENOSCOPY    . TONSILLECTOMY AND ADENOIDECTOMY  1959  . URETHRAL DILATION      Social History Social History   Tobacco Use  . Smoking status: Never Smoker  . Smokeless tobacco: Never Used  Substance Use Topics  . Alcohol use: No    Alcohol/week: 0.0 oz  . Drug use: No    Family History Family History  Problem Relation Age of Onset  . Diabetes Mother   . Cancer Father        lung  . Breast cancer Maternal Grandmother 60  . Breast cancer Maternal Aunt 65  No family history of bleeding/clotting disorders, porphyria or autoimmune disease   Allergies  Allergen Reactions  . Erythromycin Hives  . Augmentin [Amoxicillin-Pot Clavulanate] Hives  . Demerol [Meperidine] Nausea Only     REVIEW OF SYSTEMS (Negative unless checked)  Constitutional: Weight loss  Fever  Chills Cardiac: Chest pain   Chest pressure   Palpitations   Shortness of breath when laying flat   Shortness of breath with exertion. Vascular:  Pain in legs with walking   Pain in legs with standing History of DVT   Phlebitis   Swelling in legs   Varicose veins   Non-healing ulcers Pulmonary:   Uses home oxygen   Productive cough   Hemoptysis   Wheeze  COPD   Asthma Neurologic:  Dizziness   Seizures   History of stroke   History of TIA  Aphasia   Vissual changes   Weakness or numbness in arm   Weakness or numbness in leg Musculoskeletal:   Joint swelling   Joint pain   Low back pain Hematologic:  Easy bruising  Easy bleeding   Hypercoagulable state   Anemic Gastrointestinal:  Diarrhea   Vomiting  Gastroesophageal reflux/heartburn   Difficulty swallowing. Genitourinary:  Chronic kidney disease   Difficult urination   Frequent urination   Blood in urine Skin:  Rashes   Ulcers  Psychological:  History of anxiety    History of major depression.  Physical Examination  Vitals:   01/23/18 0829  BP: (!) 154/87  Pulse: 68  Resp: 19  Weight: 290 lb (131.5 kg)  Height:  (1.6 m)   Body mass index is 51.37 kg/m. Gen: WD/WN, NAD Head: [place]/AT, No temporalis wasting.  Ear/Nose/Throat: Hearing grossly intact, nares w/o erythema or drainage, poor dentition Eyes: PER, EOMI, sclera nonicteric.  Neck: Supple, no masses.  No bruit or JVD.  Pulmonary:  Good air movement, clear to auscultation bilaterally, no use of accessory muscles.  Cardiac: RRR, normal S1, S2, no Murmurs. Vascular: scattered varicosities present bilaterally.  Mild venous stasis changes to the legs bilaterally.  2+ soft pitting edema Vessel Right Left  Radial Palpable Palpable  PT Palpable Palpable  DP Palpable Palpable  Gastrointestinal: soft, non-distended. No guarding/no peritoneal signs.  Musculoskeletal: M/S 5/5 throughout.  No deformity or atrophy.  Neurologic: CN 2-12 intact. Pain and light touch intact in extremities.  Symmetrical.  Speech is fluent. Motor exam as listed above. Psychiatric: Judgment intact, Mood & affect appropriate for pt's clinical situation. Dermatologic: mild venous rashes rashes no ulcers noted.  No changes consistent with cellulitis. Lymph : No Cervical lymphadenopathy, no lichenification or skin changes of chronic lymphedema.  CBC Lab Results  Component Value Date   WBC 9.0 05/02/2017   HGB 12.6 05/02/2017   HCT 37.6 05/02/2017   MCV 87.6 05/02/2017   PLT 247.0 05/02/2017    BMET    Component Value Date/Time   NA 140 10/04/2017 1254   K 4.1 10/04/2017 1254   CL 102 10/04/2017 1254   CO2 29 10/04/2017 1254   GLUCOSE 117 (H) 10/04/2017 1254   BUN 22 10/04/2017 1254   CREATININE 0.72 10/04/2017 1254   CREATININE 0.69 12/04/2012 0930   CALCIUM 9.3 10/04/2017 1254   GFRNONAA >89  12/04/2012 0930   GFRAA >89 12/04/2012 0930   CrCl cannot be calculated (Patient's most recent lab result is older than the maximum 21 days allowed.).  COAG Lab Results  Component Value Date   INR 0.99 11/15/2012    Radiology No results found.   Assessment/Plan 1. Lymphedema I have had a long discussion with the patient regarding swelling and why it  causes symptoms.  Patient will begin wearing graduated compression stockings class 1 (20-30 mmHg) on a daily basis a prescription was given. The patient will  beginning wearing the stockings first thing in the morning and removing them in the evening. The patient is instructed specifically not to sleep in the stockings.   In addition, behavioral modification will be initiated.  This will include frequent elevation, use of over the counter pain medications and exercise such as walking.  I have reviewed systemic causes for chronic edema such as liver, kidney and cardiac etiologies.  The patient denies problems with these organ systems.    Consideration for a lymph pump will also be made based upon the effectiveness of conservative therapy.  This would help to improve the edema control and prevent sequela such as ulcers and infections   The patient will follow-up with me in three months.    2. Chronic venous insufficiency No surgery or intervention at this point in time.    I have had a long discussion with the patient regarding venous insufficiency and why it  causes symptoms. I have discussed with the patient the chronic skin changes that accompany venous insufficiency and the long term sequela such as infection and ulceration.  Patient will begin wearing graduated compression stockings class 1 (20-30 mmHg) or compression wraps on a daily basis a prescription was given. The patient will put the stockings on first thing in the morning and removing them in the evening. The patient is instructed specifically not to sleep in the stockings.     In addition, behavioral modification including several periods of elevation of the lower extremities during the day will be continued. I have demonstrated that proper elevation is a position with the ankles at heart level.  The patient is instructed to begin routine exercise, especially walking on a daily basis  Patient's venous duplex is negative for DVT or reflux.  The patient will follow up in 2-3 months to reassess the degree  of swelling and the control that graduated compression stockings or compression wraps  is offering.   The patient can be assessed for a Lymph Pump at that time  3. Essential hypertension Continue antihypertensive medications as already ordered, these medications have been reviewed and there are no changes at this time.   4. Atrial fibrillation, unspecified type (HCC) Continue antiarrhythmia medications as already ordered, these medications have been reviewed and there are no changes at this time.  Continue anticoagulation as ordered by Cardiology Service   5. Obstructive sleep apnea Continue pulmonary medications and aerosols as already ordered, these medications have been reviewed and there are no changes at this time.      Levora Dredge, MD  01/23/2018 8:55 AM

## 2018-01-26 ENCOUNTER — Encounter: Payer: Self-pay | Admitting: Internal Medicine

## 2018-01-26 NOTE — Assessment & Plan Note (Signed)
CPAP.  

## 2018-01-26 NOTE — Assessment & Plan Note (Signed)
Discussed diet and exercise.  Follow.  

## 2018-01-26 NOTE — Assessment & Plan Note (Signed)
Persistent lower extremity swelling.  Redness.  Recent ultrasound negative for DVT.  Triamcinolone Cream as directed.  Elevate legs.  Discussed compression hose.  Refer to vascular surgery for further evaluation.

## 2018-01-26 NOTE — Assessment & Plan Note (Signed)
S/p breast biopsy x 2.  Saw Dr Lemar Livings previously.  Recent mammogram recommended f/u mammogram in 6 months.  Scheduled for 02/2018.  Follow.

## 2018-01-26 NOTE — Assessment & Plan Note (Signed)
Low cholesterol diet and exercise.  Follow lipid panel.   

## 2018-01-26 NOTE — Assessment & Plan Note (Signed)
Low carb diet and exercise.  Discussed weight loss.  Seeing endocrinology.

## 2018-01-26 NOTE — Assessment & Plan Note (Signed)
Controlled on current regimen.   

## 2018-01-26 NOTE — Assessment & Plan Note (Signed)
Blood pressure under good control.  Continue same medication regimen.  Follow pressures.  Follow metabolic panel.   

## 2018-01-26 NOTE — Assessment & Plan Note (Signed)
Followed by Dr Lady Gary.  In SR.  On aspirin.  Stable.

## 2018-01-27 ENCOUNTER — Encounter (INDEPENDENT_AMBULATORY_CARE_PROVIDER_SITE_OTHER): Payer: Self-pay | Admitting: Vascular Surgery

## 2018-02-14 ENCOUNTER — Ambulatory Visit
Admission: RE | Admit: 2018-02-14 | Discharge: 2018-02-14 | Disposition: A | Discharge status: Home / Self Care | Payer: Self-pay | Source: Ambulatory Visit | Attending: Internal Medicine | Admitting: Internal Medicine

## 2018-02-14 DIAGNOSIS — R928 Other abnormal and inconclusive findings on diagnostic imaging of breast: Secondary | ICD-10-CM | POA: Insufficient documentation

## 2018-02-17 ENCOUNTER — Other Ambulatory Visit: Payer: Self-pay | Admitting: Internal Medicine

## 2018-02-17 DIAGNOSIS — R928 Other abnormal and inconclusive findings on diagnostic imaging of breast: Secondary | ICD-10-CM

## 2018-02-17 DIAGNOSIS — Z1239 Encounter for other screening for malignant neoplasm of breast: Secondary | ICD-10-CM

## 2018-02-17 NOTE — Progress Notes (Signed)
Orders placed for f/u mammogram (bilateral diagnostic and ultrasound).

## 2018-02-28 ENCOUNTER — Other Ambulatory Visit: Payer: Self-pay | Admitting: Internal Medicine

## 2018-03-27 ENCOUNTER — Other Ambulatory Visit: Payer: Self-pay | Admitting: Internal Medicine

## 2018-04-10 ENCOUNTER — Ambulatory Visit (INDEPENDENT_AMBULATORY_CARE_PROVIDER_SITE_OTHER): Payer: Self-pay | Admitting: Vascular Surgery

## 2018-05-09 ENCOUNTER — Encounter: Payer: Self-pay | Admitting: Internal Medicine

## 2018-05-23 ENCOUNTER — Ambulatory Visit (INDEPENDENT_AMBULATORY_CARE_PROVIDER_SITE_OTHER): Payer: Self-pay | Admitting: Internal Medicine

## 2018-05-23 ENCOUNTER — Encounter: Payer: Self-pay | Admitting: Internal Medicine

## 2018-05-23 VITALS — BP 138/72 | HR 77 | Temp 97.4°F | Resp 18 | Ht 62.5 in | Wt 301.5 lb

## 2018-05-23 DIAGNOSIS — I4891 Unspecified atrial fibrillation: Secondary | ICD-10-CM

## 2018-05-23 DIAGNOSIS — R928 Other abnormal and inconclusive findings on diagnostic imaging of breast: Secondary | ICD-10-CM

## 2018-05-23 DIAGNOSIS — E118 Type 2 diabetes mellitus with unspecified complications: Secondary | ICD-10-CM

## 2018-05-23 DIAGNOSIS — I10 Essential (primary) hypertension: Secondary | ICD-10-CM

## 2018-05-23 DIAGNOSIS — E7800 Pure hypercholesterolemia, unspecified: Secondary | ICD-10-CM

## 2018-05-23 DIAGNOSIS — I872 Venous insufficiency (chronic) (peripheral): Secondary | ICD-10-CM

## 2018-05-23 DIAGNOSIS — G4733 Obstructive sleep apnea (adult) (pediatric): Secondary | ICD-10-CM

## 2018-05-23 DIAGNOSIS — K219 Gastro-esophageal reflux disease without esophagitis: Secondary | ICD-10-CM

## 2018-05-23 DIAGNOSIS — Z0000 Encounter for general adult medical examination without abnormal findings: Secondary | ICD-10-CM

## 2018-05-23 DIAGNOSIS — I890 Lymphedema, not elsewhere classified: Secondary | ICD-10-CM

## 2018-05-23 NOTE — Assessment & Plan Note (Addendum)
Physical today 05/23/18.  Colonoscopy 09/19/15.  Recommended f/u in 5 years.  Scheduled for diagnostic mammogram in 08/2018.

## 2018-05-23 NOTE — Progress Notes (Signed)
Patient ID: Christine Robertson, female   DOB: [birth year], 62 y.o.   MRN: [id number]   Subjective:    Patient ID: Christine Robertson, female    DOB: 1956-08-18, 62 y.o.   MRN: [id number]  HPI  Patient here for her physical exam.  She reports she is doing relatively well.  Discussed diet and exercise.  Discussed the need for weight loss.  No chest pain.  Breathing stable.  Saw vascular surgery for the lower extremity swelling and lymphedema.  Note reviewed.  Swelling is better.  No acid reflux.  No abdominal pain.  Bowels moving.  Stats am sugars averaging 145-150.  Brought in no recorded sugar readings.  Had mammogram 08/2018.     Past Medical History:  Diagnosis Date  . Abnormal results of thyroid function studies   . Allergy    hay fever  . Angina pectoris, unspecified ([place])   . Arthritis   . Atrial fibrillation, unspecified   . Chicken pox   . Cold hands   . Congenital anomaly of adrenal gland   . Diabetes mellitus    type 2, uncomplicated  . Essential hypertension, benign   . GERD (gastroesophageal reflux disease)   . IBS (irritable bowel syndrome)   . Malaise   . Morbid obesity ([place])   . Murmur   . Pain in soft tissues of limb   . Proteinuria   . Pure hypercholesterolemia   . Rash and other nonspecific skin eruption   . Sleep apnea    Obstructive; Uses C-Pap machine  . Urinary incontinence    Past Surgical History:  Procedure Laterality Date  . ABDOMINAL HYSTERECTOMY  2010  . BLADDER SURGERY  1967   stretching of bladder  . BREAST BIOPSY Left 03/28/2015   done at Dr. Dwyane Luo office. fibrocystic changes  . COLONOSCOPY    . COLONOSCOPY WITH PROPOFOL N/A 09/09/2015   Procedure: COLONOSCOPY WITH PROPOFOL;  Surgeon: Manya Silvas, MD;  Location: [facility] ENDOSCOPY;  Service: Endoscopy;  Laterality: N/A;  . ESOPHAGOGASTRODUODENOSCOPY    . TONSILLECTOMY AND ADENOIDECTOMY  1959  . URETHRAL DILATION     Family History  Problem Relation Age of Onset  . Diabetes Mother   . Cancer  Father        lung  . Breast cancer Maternal Grandmother 60  . Breast cancer Maternal Aunt 65   Social History   Socioeconomic History  . Marital status: Married    Spouse name: Not on file  . Number of children: Not on file  . Years of education: Not on file  . Highest education level: Not on file  Occupational History  . Not on file  Social Needs  . Financial resource strain: Not on file  . Food insecurity:    Worry: Not on file    Inability: Not on file  . Transportation needs:    Medical: Not on file    Non-medical: Not on file  Tobacco Use  . Smoking status: Never Smoker  . Smokeless tobacco: Never Used  Substance and Sexual Activity  . Alcohol use: No    Alcohol/week: 0.0 standard drinks  . Drug use: No  . Sexual activity: Not on file  Lifestyle  . Physical activity:    Days per week: Not on file    Minutes per session: Not on file  . Stress: Not on file  Relationships  . Social connections:    Talks on phone: Not on file    Gets together: Not  on file    Attends religious service: Not on file    Active member of club or organization: Not on file    Attends meetings of clubs or organizations: Not on file    Relationship status: Not on file  Other Topics Concern  . Not on file  Social History Narrative  . Not on file    Outpatient Encounter Medications as of 05/23/2018  Medication Sig  . aspirin 81 MG tablet Take 81 mg by mouth daily.  . beclomethasone (QVAR) 40 MCG/ACT inhaler Inhale 1 puff into the lungs 2 (two) times daily.  . cetirizine (ZYRTEC) 10 MG tablet Take 10 mg by mouth daily.  . clotrimazole-betamethasone (LOTRISONE) cream Apply 1 application topically 2 (two) times daily.  . flecainide (TAMBOCOR) 100 MG tablet TAKE 1 TABLET BY MOUTH TWICE DAILY  . glimepiride (AMARYL) 2 MG tablet Take 1 tablet (2 mg total) by mouth 2 (two) times daily.  . hydrochlorothiazide (HYDRODIURIL) 25 MG tablet TAKE 1 TABLET BY MOUTH ONCE DAILY  . metFORMIN  (GLUCOPHAGE) 1000 MG tablet TAKE 1 TABLET BY MOUTH TWICE DAILY WITH MEALS  . metoprolol tartrate (LOPRESSOR) 25 MG tablet Take 25 mg by mouth 2 (two) times daily.  . Multiple Vitamin (MULTIVITAMIN) tablet Take 1 tablet by mouth daily.  Marland Kitchen omeprazole (PRILOSEC) 20 MG capsule TAKE 1 CAPSULE BY MOUTH TWICE DAILY (Patient taking differently: TAKE 1 CAPSULE BY MOUTH  DAILY)  . oxybutynin (DITROPAN) 5 MG tablet TAKE 1 TABLET BY MOUTH TWICE DAILY  . triamcinolone cream (KENALOG) 0.1 % Apply 1 application topically 2 (two) times daily.  . [DISCONTINUED] lisinopril (PRINIVIL,ZESTRIL) 10 MG tablet TAKE 1 TABLET BY MOUTH ONCE DAILY  . rosuvastatin (CRESTOR) 10 MG tablet Take 1 tablet (10 mg total) by mouth daily. (Patient not taking: Reported on 01/23/2018)   No facility-administered encounter medications on file as of 05/23/2018.     Review of Systems  Constitutional: Negative for appetite change and unexpected weight change.  HENT: Negative for congestion and sinus pressure.   Eyes: Negative for pain and visual disturbance.  Respiratory: Negative for cough, chest tightness and shortness of breath.   Cardiovascular: Negative for chest pain and palpitations.       Lower extremity swelling -  improved.    Gastrointestinal: Negative for abdominal pain, diarrhea, nausea and vomiting.  Genitourinary: Negative for difficulty urinating and dysuria.  Musculoskeletal: Negative for joint swelling and myalgias.  Skin: Negative for color change and rash.  Neurological: Negative for dizziness, light-headedness and headaches.  Hematological: Negative for adenopathy. Does not bruise/bleed easily.  Psychiatric/Behavioral: Negative for agitation and dysphoric mood.       Objective:    Physical Exam  Constitutional: She is oriented to person, place, and time. She appears well-developed and well-nourished. No distress.  HENT:  Nose: Nose normal.  Mouth/Throat: Oropharynx is clear and moist.  Eyes: Right eye  exhibits no discharge. Left eye exhibits no discharge. No scleral icterus.  Neck: Neck supple. No thyromegaly present.  Cardiovascular: Normal rate and regular rhythm.  Pulmonary/Chest: Breath sounds normal. No accessory muscle usage. No tachypnea. No respiratory distress. She has no decreased breath sounds. She has no wheezes. She has no rhonchi. Right breast exhibits no inverted nipple, no mass, no nipple discharge and no tenderness (no axillary adenopathy). Left breast exhibits no inverted nipple, no mass, no nipple discharge and no tenderness (no axilarry adenopathy).  Abdominal: Soft. Bowel sounds are normal. There is no tenderness.  Musculoskeletal: She exhibits no edema  or tenderness.  Lymphadenopathy:    She has no cervical adenopathy.  Neurological: She is alert and oriented to person, place, and time.  Skin: No rash noted. No erythema.  Psychiatric: She has a normal mood and affect. Her behavior is normal.    BP 138/72 (BP Location: Left Arm, Patient Position: Sitting, Cuff Size: Large)   Pulse 77   Temp (!) 97.4 F (36.3 C) (Oral)   Resp 18   Ht 5' 2.5" (1.588 m)   Wt (!) 301 lb 8 oz (136.8 kg)   SpO2 97%   BMI 54.27 kg/m  Wt Readings from Last 3 Encounters:  05/23/18 (!) 301 lb 8 oz (136.8 kg)  01/23/18 290 lb (131.5 kg)  01/16/18 295 lb 3.2 oz (133.9 kg)     Lab Results  Component Value Date   WBC 9.0 05/02/2017   HGB 12.6 05/02/2017   HCT 37.6 05/02/2017   PLT 247.0 05/02/2017   GLUCOSE 117 (H) 10/04/2017   CHOL 173 10/04/2017   TRIG 218.0 (H) 10/04/2017   HDL 43.60 10/04/2017   LDLDIRECT 102.0 10/04/2017   LDLCALC 75 05/02/2017   ALT 26 10/04/2017   AST 19 10/04/2017   NA 140 10/04/2017   K 4.1 10/04/2017   CL 102 10/04/2017   CREATININE 0.72 10/04/2017   BUN 22 10/04/2017   CO2 29 10/04/2017   TSH 1.15 05/02/2017   INR 0.99 11/15/2012   HGBA1C 7.8 (H) 10/04/2017   MICROALBUR 2.6 (H) 10/04/2017    Mm Digital Diagnostic Unilat L  Result Date:  02/14/2018 CLINICAL DATA:  Follow-up of probably benign left breast calcifications, first seen in September 2018. EXAM: DIGITAL DIAGNOSTIC LEFT MAMMOGRAM WITH CAD COMPARISON:  Previous exam(s). ACR Breast Density Category c: The breast tissue is heterogeneously dense, which may obscure small masses. FINDINGS: Standard and compression magnification views of the left breast demonstrate persistent stable group of benign-appearing calcifications in the lower inner left breast, middle depth. There is no associated mass. No suspicious findings in the left breast. Mammographic images were processed with CAD. IMPRESSION: Stable probably benign calcifications in the left breast lower inner quadrant, middle depth, for which six-month follow-up is recommended RECOMMENDATION: Bilateral diagnostic mammogram in December 2019. I have discussed the findings and recommendations with the patient. Results were also provided in writing at the conclusion of the visit. If applicable, a reminder letter will be sent to the patient regarding the next appointment. BI-RADS CATEGORY  3: Probably benign. Electronically Signed   By: Fidela Salisbury M.D.   On: 02/14/2018 14:31       Assessment & Plan:   Problem List Items Addressed This Visit    Abnormal mammogram of left breast    Has seen Dr Bary Castilla.  S/p breast biopsy x 2.  Scheduled for f/u mammogram diagnostic in 08/2018.        Atrial fibrillation ([place])    Followed by Dr Ubaldo Glassing.  Appears to be in SR.  On aspirin.  Stable.       Chronic venous insufficiency    Swelling is better.  Compression hose.        Diabetes mellitus ([place])    Low carb diet and exercise.  Discussed the need for weight loss.  Sugars as outlined.  Brought in no recorded sugar readings.  Follow met b and a1c.        Relevant Orders   Hemoglobin A1c   GERD (gastroesophageal reflux disease)    Controlled on current regimen.  Follow.  Health care maintenance    Physical today 05/23/18.   Colonoscopy 09/19/15.  Recommended f/u in 5 years.  Scheduled for diagnostic mammogram in 08/2018.        Hypercholesteremia    Low cholesterol diet and exercise.  Follow lipid panel.       Relevant Orders   Hepatic function panel   Lipid panel   Hypertension    Blood pressure has been under good control.  Follow metabolic panel.        Relevant Orders   CBC with Differential/Platelet   TSH   Basic metabolic panel   Lymphedema    Seeing vascular surgery.       Obesity, morbid, BMI 50 or higher ([place])    Discussed diet and exercise and need for weight loss.        Obstructive sleep apnea    CPAP.        Other Visit Diagnoses    Routine general medical examination at a health care facility    -  Primary       Einar Pheasant, MD

## 2018-05-26 ENCOUNTER — Other Ambulatory Visit: Payer: Self-pay | Admitting: Internal Medicine

## 2018-06-01 ENCOUNTER — Encounter: Payer: Self-pay | Admitting: Internal Medicine

## 2018-06-01 NOTE — Assessment & Plan Note (Signed)
Low cholesterol diet and exercise.  Follow lipid panel.   

## 2018-06-01 NOTE — Assessment & Plan Note (Signed)
Discussed diet and exercise and need for weight loss.

## 2018-06-01 NOTE — Assessment & Plan Note (Signed)
Seeing vascular surgery.

## 2018-06-01 NOTE — Assessment & Plan Note (Signed)
Controlled on current regimen.  Follow.  

## 2018-06-01 NOTE — Assessment & Plan Note (Signed)
Blood pressure has been under good control.  Follow metabolic panel.

## 2018-06-01 NOTE — Assessment & Plan Note (Signed)
Swelling is better.  Compression hose.

## 2018-06-01 NOTE — Assessment & Plan Note (Signed)
Followed by Dr Lady Gary.  Appears to be in SR.  On aspirin.  Stable.

## 2018-06-01 NOTE — Assessment & Plan Note (Signed)
Has seen Dr Lemar Livings.  S/p breast biopsy x 2.  Scheduled for f/u mammogram diagnostic in 08/2018.

## 2018-06-01 NOTE — Assessment & Plan Note (Signed)
CPAP.  

## 2018-06-01 NOTE — Assessment & Plan Note (Signed)
Low carb diet and exercise.  Discussed the need for weight loss.  Sugars as outlined.  Brought in no recorded sugar readings.  Follow met b and a1c.

## 2018-06-05 ENCOUNTER — Other Ambulatory Visit (INDEPENDENT_AMBULATORY_CARE_PROVIDER_SITE_OTHER): Payer: Self-pay

## 2018-06-05 DIAGNOSIS — E118 Type 2 diabetes mellitus with unspecified complications: Secondary | ICD-10-CM

## 2018-06-05 DIAGNOSIS — I10 Essential (primary) hypertension: Secondary | ICD-10-CM

## 2018-06-05 DIAGNOSIS — E7800 Pure hypercholesterolemia, unspecified: Secondary | ICD-10-CM

## 2018-06-05 LAB — CBC WITH DIFFERENTIAL/PLATELET
Basophils Absolute: 0.1 10*3/uL (ref 0.0–0.1)
Basophils Relative: 0.6 % (ref 0.0–3.0)
EOS ABS: 0.3 10*3/uL (ref 0.0–0.7)
EOS PCT: 2.6 % (ref 0.0–5.0)
HCT: 38.9 % (ref 36.0–46.0)
HEMOGLOBIN: 12.7 g/dL (ref 12.0–15.0)
Lymphocytes Relative: 25 % (ref 12.0–46.0)
Lymphs Abs: 2.6 10*3/uL (ref 0.7–4.0)
MCHC: 32.7 g/dL (ref 30.0–36.0)
MCV: 87.1 fl (ref 78.0–100.0)
MONO ABS: 0.6 10*3/uL (ref 0.1–1.0)
Monocytes Relative: 5.6 % (ref 3.0–12.0)
Neutro Abs: 6.9 10*3/uL (ref 1.4–7.7)
Neutrophils Relative %: 66.2 % (ref 43.0–77.0)
Platelets: 260 10*3/uL (ref 150.0–400.0)
RBC: 4.46 Mil/uL (ref 3.87–5.11)
RDW: 13.8 % (ref 11.5–15.5)
WBC: 10.4 10*3/uL (ref 4.0–10.5)

## 2018-06-05 LAB — BASIC METABOLIC PANEL
BUN: 24 mg/dL — AB (ref 6–23)
CHLORIDE: 100 meq/L (ref 96–112)
CO2: 31 meq/L (ref 19–32)
Calcium: 10.1 mg/dL (ref 8.4–10.5)
Creatinine, Ser: 0.79 mg/dL (ref 0.40–1.20)
GFR: 77.84 mL/min (ref >60.00)
Glucose, Bld: 133 mg/dL — ABNORMAL HIGH (ref 70–99)
Potassium: 4.4 mEq/L (ref 3.5–5.1)
Sodium: 139 mEq/L (ref 135–145)

## 2018-06-05 LAB — LIPID PANEL
CHOL/HDL RATIO: 3
Cholesterol: 168 mg/dL (ref 0–200)
HDL: 48.4 mg/dL (ref >39.00)
NonHDL: 119.18
Triglycerides: 210 mg/dL — ABNORMAL HIGH (ref 0.0–149.0)
VLDL: 42 mg/dL — AB (ref 0.0–40.0)

## 2018-06-05 LAB — HEPATIC FUNCTION PANEL
ALT: 24 U/L (ref 0–35)
AST: 19 U/L (ref 0–37)
Albumin: 4.1 g/dL (ref 3.5–5.2)
Alkaline Phosphatase: 54 U/L (ref 39–117)
BILIRUBIN TOTAL: 0.4 mg/dL (ref 0.2–1.2)
Bilirubin, Direct: 0.1 mg/dL (ref 0.0–0.3)
Total Protein: 6.8 g/dL (ref 6.0–8.3)

## 2018-06-05 LAB — TSH: TSH: 1 u[IU]/mL (ref 0.35–4.50)

## 2018-06-05 LAB — LDL CHOLESTEROL, DIRECT: LDL DIRECT: 101 mg/dL

## 2018-06-05 LAB — HEMOGLOBIN A1C: HEMOGLOBIN A1C: 7.4 % — AB (ref 4.6–6.5)

## 2018-07-02 ENCOUNTER — Other Ambulatory Visit: Payer: Self-pay | Admitting: Internal Medicine

## 2018-07-02 MED ORDER — OXYBUTYNIN CHLORIDE 5 MG PO TABS: 5.0000 mg | ORAL_TABLET | Freq: Two times a day (BID) | ORAL | 0 refills | Status: DC

## 2018-07-02 NOTE — Telephone Encounter (Signed)
Copied from CRM [id number]. Topic: Quick Communication - Rx Refill/Question >> Jul 02, 2018 11:26 AM Arlyss Gandy, NT wrote: Medication: oxybutynin (DITROPAN) 5 MG tablet   Has the patient contacted their pharmacy? Yes.   (Agent: If no, request that the patient contact the pharmacy for the refill.) (Agent: If yes, when and what did the pharmacy advise?)  Preferred Pharmacy (with phone number or street name): [facility] [address] - [place], [place] - [address] [phone] (Phone) [phone] (Fax)    Agent: Please be advised that RX refills may take up to 3 business days. We ask that you follow-up with your pharmacy.

## 2018-07-21 ENCOUNTER — Telehealth: Payer: Self-pay | Admitting: Internal Medicine

## 2018-07-21 MED ORDER — METFORMIN HCL 1000 MG PO TABS: 1000.0000 mg | ORAL_TABLET | Freq: Two times a day (BID) | ORAL | 1 refills | Status: DC

## 2018-07-21 NOTE — Telephone Encounter (Signed)
Copied from CRM [id number]. Topic: Quick Communication - Rx Refill/Question >> Jul 21, 2018  3:51 PM Arlyss Gandyichardson, Calan Doren N, NT wrote: Medication: metFORMIN (GLUCOPHAGE) 1000 MG tablet  Has the patient contacted their pharmacy? Yes.   (Agent: If no, request that the patient contact the pharmacy for the refill.) (Agent: If yes, when and what did the pharmacy advise?)  Preferred Pharm[facility] (with phone number or street name[facility] [address] [place] - [address] [phone] (Phone) [phone] (Fax)    Agent: Please be advised that RX refills may take up to 3 business days. We ask that you follow-up with your pharmacy.

## 2018-07-21 NOTE — Telephone Encounter (Signed)
Script called to pharmacy

## 2018-07-22 ENCOUNTER — Other Ambulatory Visit: Payer: Self-pay

## 2018-07-22 MED ORDER — METFORMIN HCL 1000 MG PO TABS: 1000.0000 mg | ORAL_TABLET | Freq: Two times a day (BID) | ORAL | 1 refills | Status: DC

## 2018-07-22 NOTE — Telephone Encounter (Signed)
[facility] & called [facility] to cancel previous script.

## 2018-07-22 NOTE — Telephone Encounter (Signed)
[facility]. [facility]. Please advise [phone]

## 2018-08-06 ENCOUNTER — Other Ambulatory Visit: Payer: Self-pay | Admitting: Internal Medicine

## 2018-08-06 MED ORDER — GLIMEPIRIDE 2 MG PO TABS: 2.0000 mg | ORAL_TABLET | Freq: Two times a day (BID) | ORAL | 1 refills | Status: DC

## 2018-08-06 NOTE — Telephone Encounter (Signed)
Copied from CRM [id number]. Topic: Quick Communication - Rx Refill/Question >> Aug 06, 2018  4:42 PM Arlyss Gandyichardson, Lakelyn Straus N, NT wrote: Medication: glimepiride (AMARYL) 2 MG tablet   Has the patient contacted their pharmacy? Yes.   (Agent: If no, request that the patient contact the pharmacy for the refill.) (Agent: If yes, when and what did the pharmacy advise?)  Preferred Pharmacy (with phone number or street name): [facility] [address], [place] - [address] [phone] (Phone) [phone] (Fax)    Agent: Please be advised that RX refills may take up to 3 business days. We ask that you follow-up with your pharmacy.

## 2018-09-05 ENCOUNTER — Ambulatory Visit
Admission: RE | Admit: 2018-09-05 | Discharge: 2018-09-05 | Disposition: A | Discharge status: Home / Self Care | Payer: Self-pay | Source: Ambulatory Visit | Attending: Internal Medicine | Admitting: Internal Medicine

## 2018-09-05 DIAGNOSIS — R928 Other abnormal and inconclusive findings on diagnostic imaging of breast: Secondary | ICD-10-CM

## 2018-09-05 DIAGNOSIS — Z1239 Encounter for other screening for malignant neoplasm of breast: Secondary | ICD-10-CM | POA: Insufficient documentation

## 2018-09-09 ENCOUNTER — Other Ambulatory Visit: Payer: Self-pay | Admitting: Internal Medicine

## 2018-09-09 MED ORDER — HYDROCHLOROTHIAZIDE 25 MG PO TABS: 25.0000 mg | ORAL_TABLET | Freq: Every day | ORAL | 0 refills | Status: DC

## 2018-09-09 NOTE — Telephone Encounter (Signed)
Copied from CRM [id number]. Topic: Quick Communication - Rx Refill/Question >> Sep 09, 2018  8:46 AM Tamela OddiHarris, Jancie Kercher J wrote: Medication: hydrochlorothiazide (HYDRODIURIL) 25 MG tablet  Patient called to request a refill for the above medication  Preferred Pharmacy (with phone number or street name): [facility] [address], [place] - [address] [phone] (Phone) [phone] (Fax)

## 2018-09-09 NOTE — Telephone Encounter (Signed)
Requested Prescriptions  Pending Prescriptions Disp Refills  . hydrochlorothiazide (HYDRODIURIL) 25 MG tablet 30 tablet 0    Sig: Take 1 tablet (25 mg total) by mouth daily.     Cardiovascular: Diuretics - Thiazide Passed - 09/09/2018  8:51 AM      Passed - Ca in normal range and within 360 days    Calcium  Date Value Ref Range Status  06/05/2018 10.1 8.4 - 10.5 mg/dL Final         Passed - Cr in normal range and within 360 days    Creat  Date Value Ref Range Status  12/04/2012 0.69 0.50 - 1.10 mg/dL Final   Creatinine, Ser  Date Value Ref Range Status  06/05/2018 0.79 0.40 - 1.20 mg/dL Final         Passed - K in normal range and within 360 days    Potassium  Date Value Ref Range Status  06/05/2018 4.4 3.5 - 5.1 mEq/L Final         Passed - Na in normal range and within 360 days    Sodium  Date Value Ref Range Status  06/05/2018 139 135 - 145 mEq/L Final         Passed - Last BP in normal range    BP Readings from Last 1 Encounters:  05/23/18 138/72         Passed - Valid encounter within last 6 months    Recent Outpatient Visits          3 months ago Routine general medical examination at a health care facility   [facility], Westley Hummer, MD   7 months ago Swelling of lower extremity   [facility], [place], MD   9 months ago Pain and swelling of left lower leg   [facility] [place], [place], FNP   11 months ago Atrial fibrillation, unspecified type [facility])   [facility] [place], MD   1 year ago SOB (shortness of breath)   [facility] [place], MD      Future Appointments            In 2 weeks Dale Upson, MD [facility] [place], [facility]

## 2018-09-25 ENCOUNTER — Ambulatory Visit: Payer: Self-pay | Admitting: Internal Medicine

## 2018-10-05 ENCOUNTER — Other Ambulatory Visit: Payer: Self-pay | Admitting: Internal Medicine

## 2018-10-24 ENCOUNTER — Other Ambulatory Visit: Payer: Self-pay | Admitting: Internal Medicine

## 2018-11-20 ENCOUNTER — Other Ambulatory Visit: Payer: Self-pay | Admitting: Family Medicine

## 2018-12-04 ENCOUNTER — Other Ambulatory Visit: Payer: Self-pay | Admitting: Internal Medicine

## 2018-12-04 MED ORDER — LISINOPRIL 10 MG PO TABS: 10.0000 mg | ORAL_TABLET | Freq: Every day | ORAL | 0 refills | Status: DC

## 2018-12-10 ENCOUNTER — Encounter: Payer: Self-pay | Admitting: Internal Medicine

## 2018-12-16 NOTE — Telephone Encounter (Signed)
Pt agreed to do a virtual appt via doxy on 4/16

## 2018-12-17 ENCOUNTER — Other Ambulatory Visit: Payer: Self-pay | Admitting: Internal Medicine

## 2018-12-18 ENCOUNTER — Ambulatory Visit (INDEPENDENT_AMBULATORY_CARE_PROVIDER_SITE_OTHER): Payer: Self-pay | Admitting: Internal Medicine

## 2018-12-18 ENCOUNTER — Other Ambulatory Visit: Payer: Self-pay

## 2018-12-18 ENCOUNTER — Encounter: Payer: Self-pay | Admitting: Internal Medicine

## 2018-12-18 DIAGNOSIS — R928 Other abnormal and inconclusive findings on diagnostic imaging of breast: Secondary | ICD-10-CM

## 2018-12-18 DIAGNOSIS — E1165 Type 2 diabetes mellitus with hyperglycemia: Secondary | ICD-10-CM

## 2018-12-18 DIAGNOSIS — I10 Essential (primary) hypertension: Secondary | ICD-10-CM

## 2018-12-18 DIAGNOSIS — G4733 Obstructive sleep apnea (adult) (pediatric): Secondary | ICD-10-CM

## 2018-12-18 DIAGNOSIS — I4891 Unspecified atrial fibrillation: Secondary | ICD-10-CM

## 2018-12-18 DIAGNOSIS — K219 Gastro-esophageal reflux disease without esophagitis: Secondary | ICD-10-CM

## 2018-12-18 DIAGNOSIS — E7800 Pure hypercholesterolemia, unspecified: Secondary | ICD-10-CM

## 2018-12-18 NOTE — Progress Notes (Addendum)
Patient ID: Christine Robertson, female   DOB: [birth year], 63 y.o.   MRN: [id number] Virtual Visit via video: Note  This visit type was conducted due to national recommendations for restrictions regarding the COVID-19 pandemic (e.g. social distancing).  This format is felt to be most appropriate for this patient at this time.  All issues noted in this document were discussed and addressed.  No physical exam was performed (except for noted visual exam findings with Video Visits).   I connected with Renee Pain on 12/18/18 at  3:30 PM EDT by a video enabled telemedicine application.  Verified that I am speaking with the correct person using two identifiers. Location patient: home Location provider: work Persons participating in the virtual visit: patient, provider  I discussed the limitations, risks, security and privacy concerns of performing an evaluation and management service by video and the availability of in person appointments. The patient expressed understanding and agreed to proceed.   Reason for visit: scheduled follow up.    HPI: She reports she is doing relatively well.  Trying to stay in.  No known COVID exposure.  No fever.  No cough or sob.  States she has not been exercising regularly.  States her blood pressure is elevated.  Check her pressure - 167/98.  Recheck while on the video visit_ 161/83 with pulse 76.  No chest pain.  No acid reflux.  No abdominal pain.  Bowels moving.  Taking her medication.  Last a1c 7.4.  Reports am sugars averaging 150s and pm sugars averaging 140-150.  Discussed the need for low carb diet and exercise.   Using cpap.     ROS: See pertinent positives and negatives per HPI.  Past Medical History:  Diagnosis Date  . Abnormal results of thyroid function studies   . Allergy    hay fever  . Angina pectoris, unspecified ([place])   . Arthritis   . Atrial fibrillation, unspecified   . Chicken pox   . Cold hands   . Congenital anomaly of adrenal gland   .  Diabetes mellitus    type 2, uncomplicated  . Essential hypertension, benign   . GERD (gastroesophageal reflux disease)   . IBS (irritable bowel syndrome)   . Malaise   . Morbid obesity ([place])   . Murmur   . Pain in soft tissues of limb   . Proteinuria   . Pure hypercholesterolemia   . Rash and other nonspecific skin eruption   . Sleep apnea    Obstructive; Uses C-Pap machine  . Urinary incontinence     Past Surgical History:  Procedure Laterality Date  . ABDOMINAL HYSTERECTOMY  2010  . BLADDER SURGERY  1967   stretching of bladder  . BREAST BIOPSY Left 03/28/2015   done at Dr. Dwyane Luo office. fibrocystic changes  . COLONOSCOPY    . COLONOSCOPY WITH PROPOFOL N/A 09/09/2015   Procedure: COLONOSCOPY WITH PROPOFOL;  Surgeon: Manya Silvas, MD;  Location: [facility] ENDOSCOPY;  Service: Endoscopy;  Laterality: N/A;  . ESOPHAGOGASTRODUODENOSCOPY    . TONSILLECTOMY AND ADENOIDECTOMY  1959  . URETHRAL DILATION      Family History  Problem Relation Age of Onset  . Diabetes Mother   . Cancer Father        lung  . Breast cancer Maternal Grandmother 60  . Breast cancer Maternal Aunt 68    SOCIAL HX: reviewed.    Current Outpatient Medications:  .  aspirin 81 MG tablet, Take 81 mg by mouth daily., Disp: ,  Rfl:  .  beclomethasone (QVAR) 40 MCG/ACT inhaler, Inhale 1 puff into the lungs 2 (two) times daily., Disp: , Rfl:  .  cetirizine (ZYRTEC) 10 MG tablet, Take 10 mg by mouth daily., Disp: , Rfl:  .  clotrimazole-betamethasone (LOTRISONE) cream, Apply 1 application topically 2 (two) times daily., Disp: 30 g, Rfl: 0 .  flecainide (TAMBOCOR) 100 MG tablet, TAKE 1 TABLET BY MOUTH TWICE DAILY, Disp: , Rfl:  .  glimepiride (AMARYL) 2 MG tablet, Take 1 tablet (2 mg total) by mouth 2 (two) times daily., Disp: 180 tablet, Rfl: 1 .  hydrochlorothiazide (HYDRODIURIL) 25 MG tablet, TAKE ONE TABLET BY MOUTH DAILY, Disp: 30 tablet, Rfl: 0 .  lisinopril (ZESTRIL) 20 MG tablet, Take 1 tablet (20  mg total) by mouth daily., Disp: 30 tablet, Rfl: 2 .  metFORMIN (GLUCOPHAGE) 1000 MG tablet, Take 1 tablet (1,000 mg total) by mouth 2 (two) times daily with a meal., Disp: 180 tablet, Rfl: 1 .  metoprolol tartrate (LOPRESSOR) 25 MG tablet, Take 25 mg by mouth 2 (two) times daily., Disp: , Rfl:  .  Multiple Vitamin (MULTIVITAMIN) tablet, Take 1 tablet by mouth daily., Disp: , Rfl:  .  omeprazole (PRILOSEC) 20 MG capsule, TAKE 1 CAPSULE BY MOUTH TWICE DAILY (Patient taking differently: TAKE 1 CAPSULE BY MOUTH  DAILY), Disp: 60 capsule, Rfl: 5 .  oxybutynin (DITROPAN) 5 MG tablet, TAKE ONE TABLET BY MOUTH TWICE A DAY, Disp: 180 tablet, Rfl: 0 .  rosuvastatin (CRESTOR) 10 MG tablet, Take 1 tablet (10 mg total) by mouth daily. (Patient not taking: Reported on 01/23/2018), Disp: 30 tablet, Rfl: 2 .  triamcinolone cream (KENALOG) 0.1 %, Apply 1 application topically 2 (two) times daily., Disp: 60 g, Rfl: 0  EXAM:  VITALS per patient if applicable: blood pressure checked by pt during video visit:  161/83. Pulse: 76  GENERAL: alert, oriented, appears well and in no acute distress  HEENT: atraumatic, conjunttiva clear, no obvious abnormalities on inspection of external nose and ears  NECK: normal movements of the head and neck  LUNGS: on inspection no signs of respiratory distress, breathing rate appears normal, no obvious gross SOB, gasping or wheezing  CV: no obvious cyanosis  PSYCH/NEURO: pleasant and cooperative, no obvious depression or anxiety, speech and thought processing grossly intact  ASSESSMENT AND PLAN:  Discussed the following assessment and plan:  Abnormal mammogram of left breast  Atrial fibrillation, unspecified type (HCC)  Type 2 diabetes mellitus with hyperglycemia, without long-term current use of insulin (HCC)  Gastroesophageal reflux disease, esophagitis presence not specified  Hypercholesteremia  Essential hypertension  Obstructive sleep apnea  Abnormal  mammogram of left breast Has seen Dr Bary Castilla.  S/p breast biopsy x 2.  Was scheduled for f/u mammogram in 08/2018.  Do not have results.  Need to get scheduled if not done - when able.    Atrial fibrillation Has been followed by Dr Ubaldo Glassing.  On aspirin.  Stable.    Diabetes mellitus Sugars as outlined.  Discussed low carb diet and exercise.  Follow met b and a1c.    GERD (gastroesophageal reflux disease) Controlled on current regimen.  Follow.    Hypercholesteremia Low cholesterol diet and exercise.  Follow lipid panel.  Off crestor.    Hypertension Blood pressure increased as outlined.  Increase lisinopril to 70m q dya.  Follow pressures.  Schedule f/u soon - to f/u on blood press.  Follow metabolic panel.    Obstructive sleep apnea CPAP.  I discussed the assessment and treatment plan with the patient. The patient was provided an opportunity to ask questions and all were answered. The patient agreed with the plan and demonstrated an understanding of the instructions.   The patient was advised to call back or seek an in-person evaluation if the symptoms worsen or if the condition fails to improve as anticipated.   Einar Pheasant, MD

## 2018-12-21 ENCOUNTER — Encounter: Payer: Self-pay | Admitting: Internal Medicine

## 2018-12-21 MED ORDER — LISINOPRIL 20 MG PO TABS: 20.0000 mg | ORAL_TABLET | Freq: Every day | ORAL | 2 refills | Status: DC

## 2018-12-21 NOTE — Assessment & Plan Note (Signed)
Has been followed by Dr Lady Gary.  On aspirin.  Stable.

## 2018-12-21 NOTE — Assessment & Plan Note (Signed)
Has seen Dr Lemar Livings.  S/p breast biopsy x 2.  Was scheduled for f/u mammogram in 08/2018.  Do not have results.  Need to get scheduled if not done - when able.

## 2018-12-21 NOTE — Assessment & Plan Note (Signed)
Sugars as outlined.  Discussed low carb diet and exercise.  Follow met b and a1c.

## 2018-12-21 NOTE — Assessment & Plan Note (Signed)
CPAP.  

## 2018-12-21 NOTE — Assessment & Plan Note (Signed)
Blood pressure increased as outlined.  Increase lisinopril to 20mg  q dya.  Follow pressures.  Schedule f/u soon - to f/u on blood press.  Follow metabolic panel.

## 2018-12-21 NOTE — Assessment & Plan Note (Signed)
Low cholesterol diet and exercise.  Follow lipid panel.  Off crestor.   

## 2018-12-21 NOTE — Assessment & Plan Note (Signed)
Controlled on current regimen.  Follow.  

## 2019-01-01 ENCOUNTER — Telehealth: Payer: Self-pay | Admitting: Internal Medicine

## 2019-01-01 NOTE — Telephone Encounter (Signed)
Dr. Lorin Picket, this patient is requesting a refill on Crestor, in the chart it states she stopped takinig it las May and the RX is expired and she stated you never called in this medication, please advise.  Nina,cma

## 2019-01-01 NOTE — Telephone Encounter (Signed)
Copied from CRM [id number]. Topic: Quick Communication - See Telephone Encounter >> Jan 01, 2019  1:41 PM Terisa Starr wrote: CRM for notification. See Telephone encounter for: 01/01/19.  Patient states Dr Lorin Picket never called in her rosuvastatin (CRESTOR) 10 MG tablet. Please Advise.  [facility] [address] - [place], [place] - [address] [place] [address] Phone: [phone] Fax: [phone]

## 2019-01-02 ENCOUNTER — Other Ambulatory Visit: Payer: Self-pay

## 2019-01-02 MED ORDER — ROSUVASTATIN CALCIUM 10 MG PO TABS: 10.0000 mg | ORAL_TABLET | Freq: Every day | ORAL | 2 refills | Status: DC

## 2019-01-02 NOTE — Telephone Encounter (Signed)
Pt is going to start crestor. I have scheduled her for labs and sent in rx

## 2019-01-02 NOTE — Telephone Encounter (Signed)
Please clarify with pt wants to start crestor.  See previous note and see me before calling pt.  I am ok to send in rx for crestor, but she will need fasting lab in 4-6 weeks after starting.

## 2019-01-07 ENCOUNTER — Other Ambulatory Visit: Payer: Self-pay | Admitting: Internal Medicine

## 2019-01-17 ENCOUNTER — Other Ambulatory Visit: Payer: Self-pay | Admitting: Internal Medicine

## 2019-01-18 ENCOUNTER — Other Ambulatory Visit: Payer: Self-pay | Admitting: Internal Medicine

## 2019-01-20 ENCOUNTER — Ambulatory Visit: Payer: Self-pay | Admitting: Internal Medicine

## 2019-02-03 IMAGING — MG DIGITAL DIAGNOSTIC BILATERAL MAMMOGRAM WITH TOMO AND CAD
8 of 11 series · 8 of 27 positions shown · non-contrast
Comparison: Previous exam(s).

CLINICAL DATA: One year follow-up of probably benign left breast
calcifications.

EXAM:
DIGITAL DIAGNOSTIC BILATERAL MAMMOGRAM WITH CAD AND TOMO

[L CC]
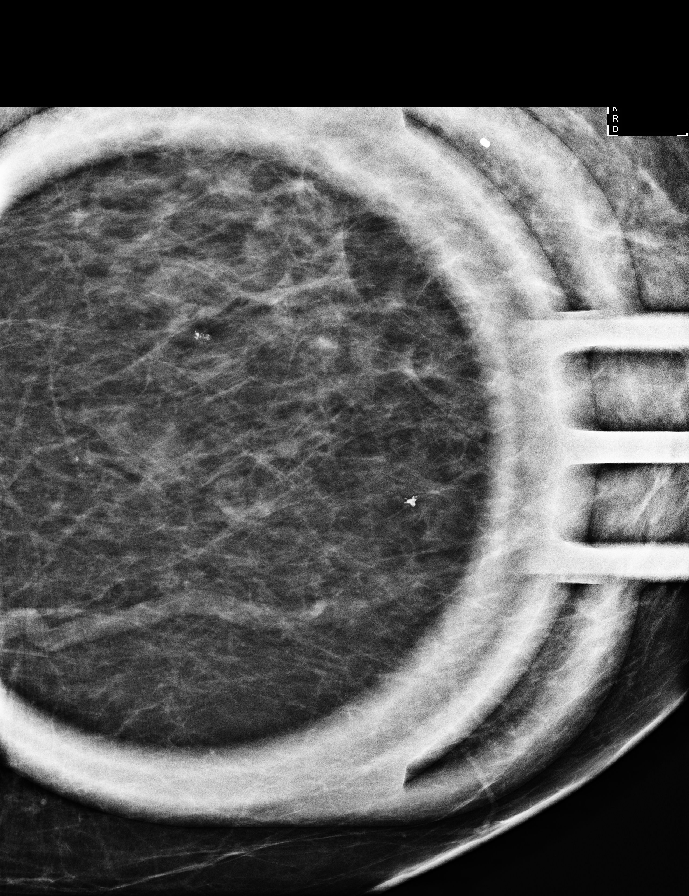

[L ML (1 of 2)]
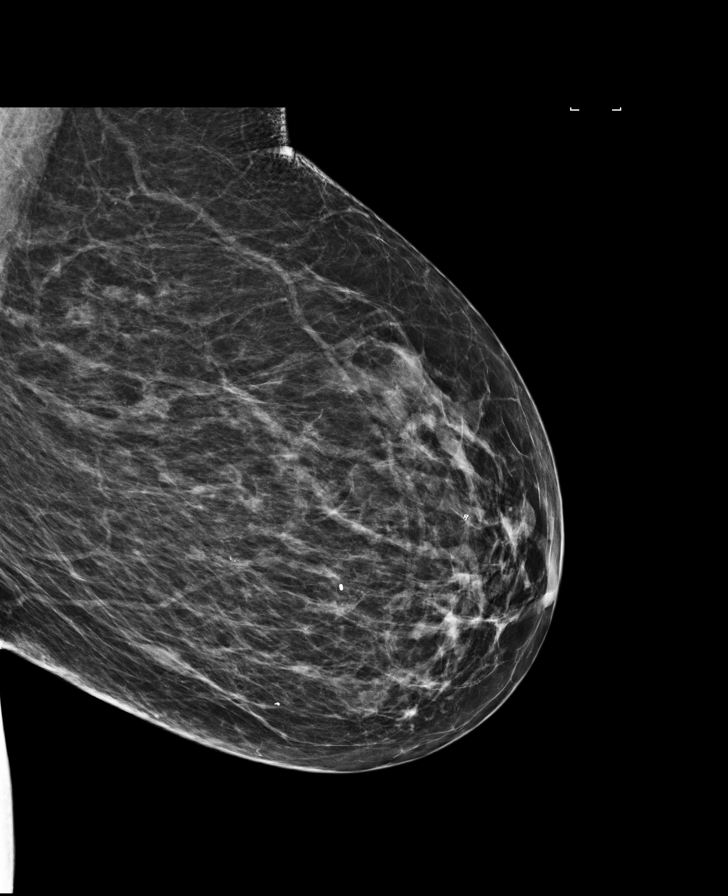

[L ML (2 of 2)]
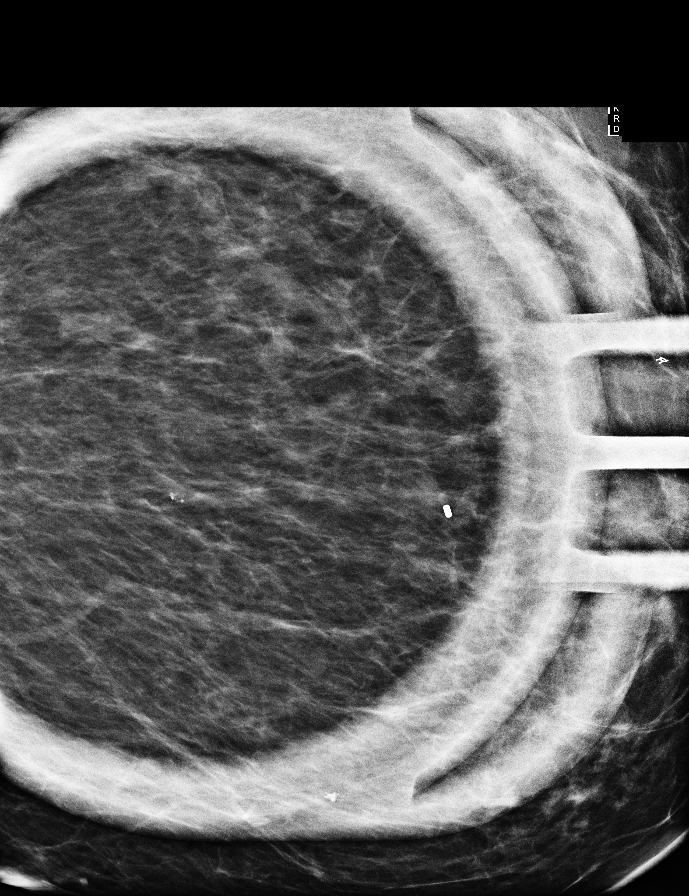

[L CC synth-2D]
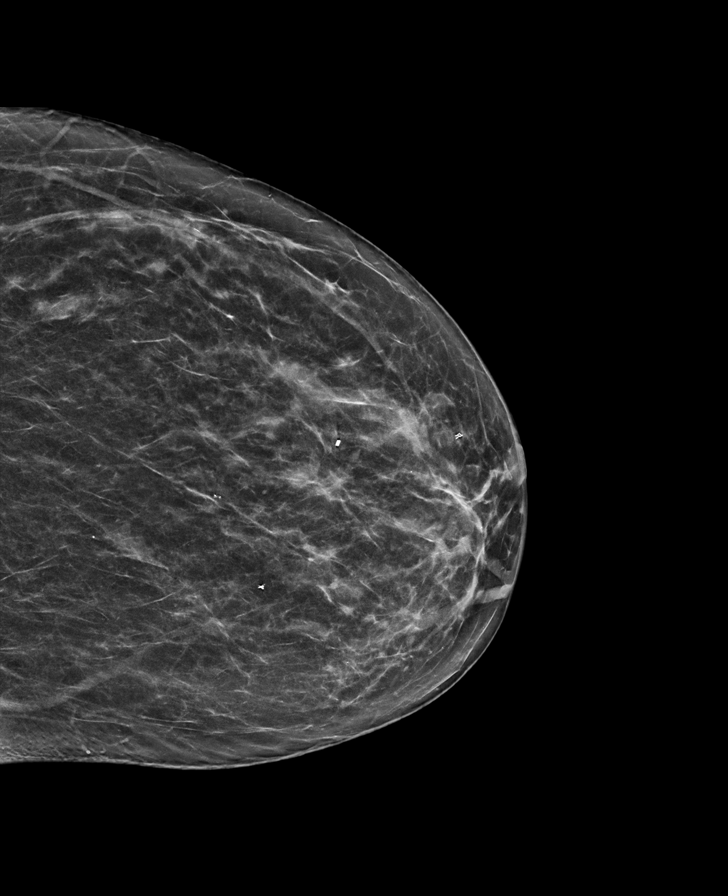

[R CC synth-2D]
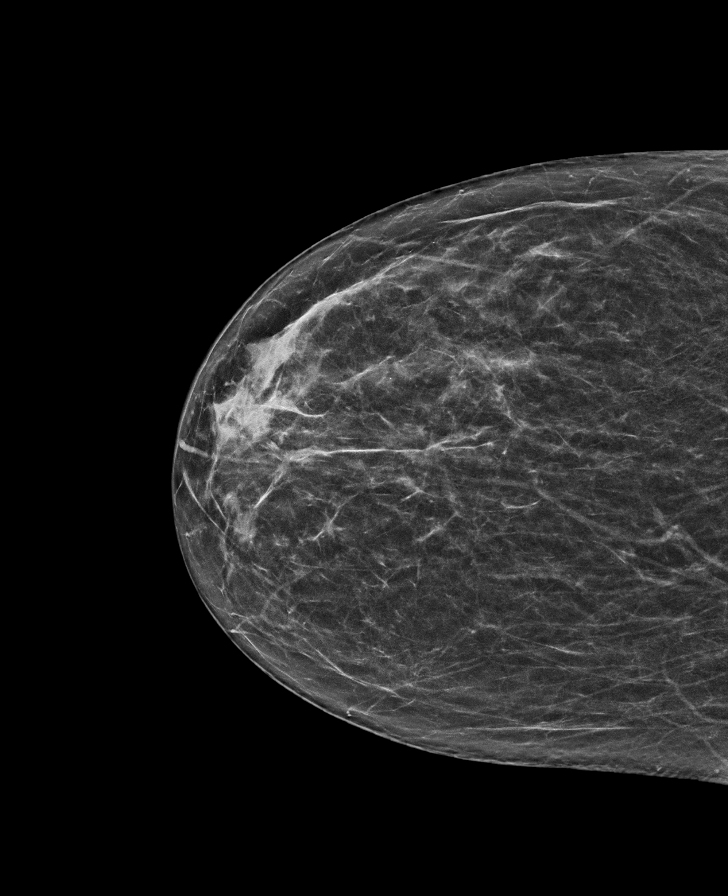

[R MLO synth-2D]
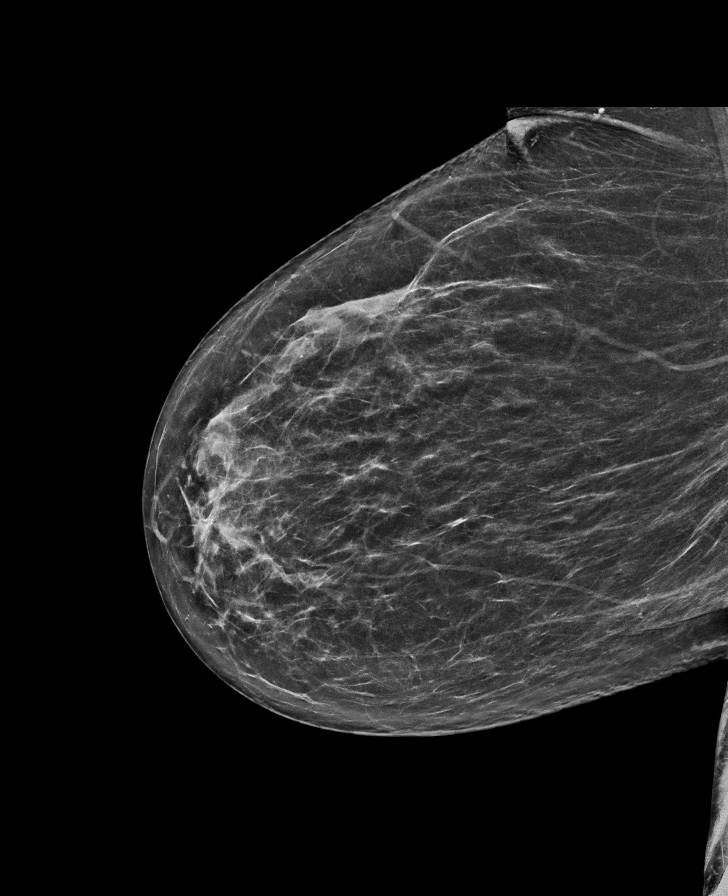

[L MLO synth-2D]
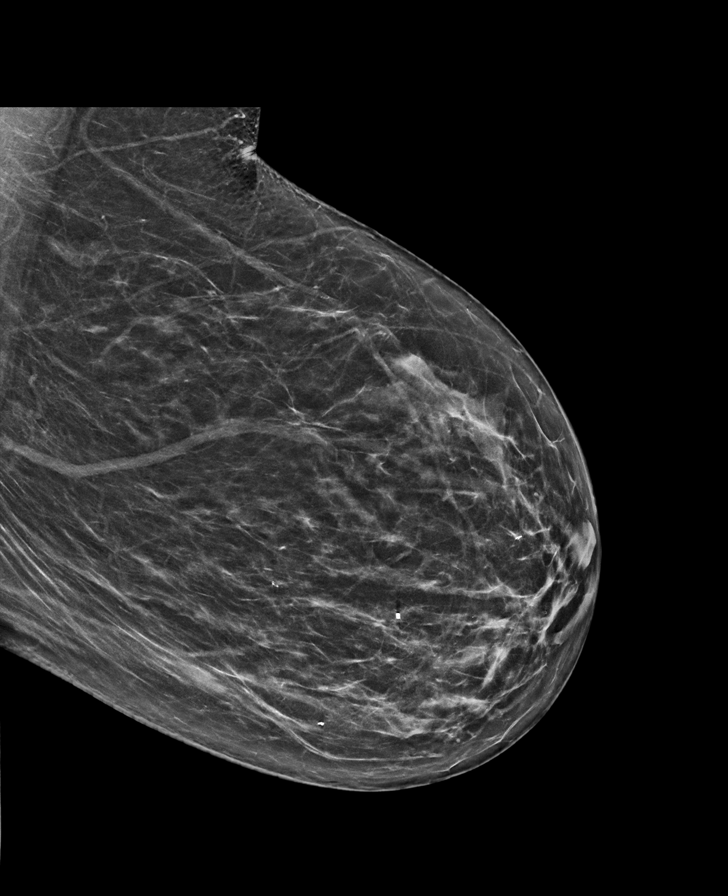

[L CC tomo · tomo slice 33/66.0]
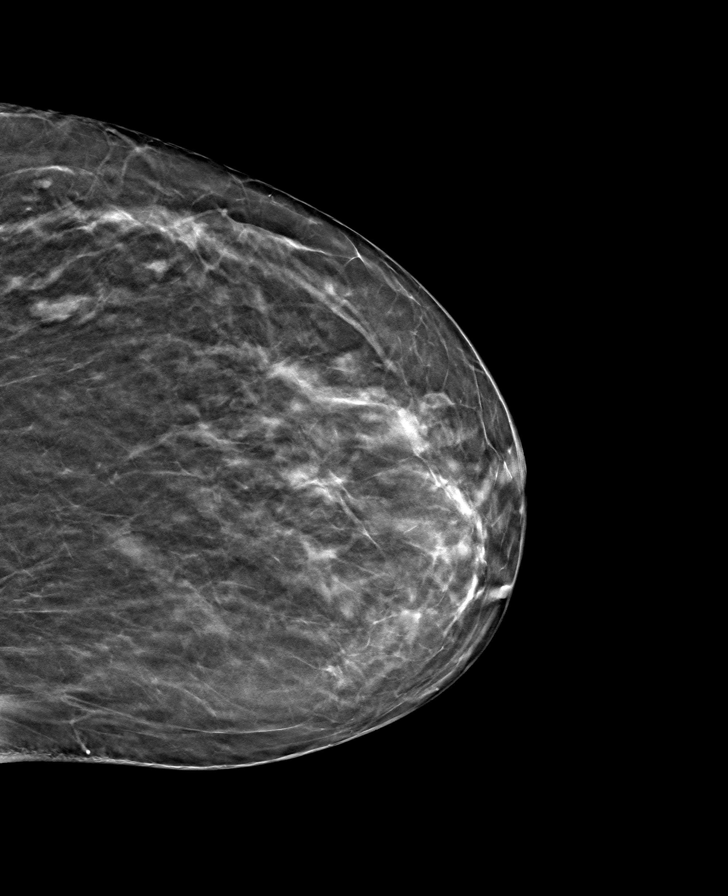

[8 of 27 positions shown; findings below may reference images not displayed]

ACR Breast Density Category b: There are scattered areas of
fibroglandular density.
FINDINGS: Mammographically, there are no suspicious masses, areas of
architectural distortion or new microcalcifications in either
breast. There is a few mm group of benign-appearing calcifications
in the left breast lower inner quadrant, middle depth, stable from
the prior mammograms. There is no associated mass.

Mammographic images were processed with CAD.
IMPRESSION: Stable probably benign left breast calcifications.

No mammographic evidence of malignancy in either breast.

RECOMMENDATION:
Diagnostic mammogram is suggested in 1 year. (Code:[id number]-7-7VA)

I have discussed the findings and recommendations with the patient.
Results were also provided in writing at the conclusion of the
visit. If applicable, a reminder letter will be sent to the patient
regarding the next appointment.

BI-RADS CATEGORY  3: Probably benign.

## 2019-02-06 ENCOUNTER — Other Ambulatory Visit: Payer: Self-pay | Admitting: Internal Medicine

## 2019-02-12 ENCOUNTER — Other Ambulatory Visit: Payer: Self-pay

## 2019-02-24 ENCOUNTER — Other Ambulatory Visit: Payer: Self-pay | Admitting: Internal Medicine

## 2019-02-24 ENCOUNTER — Telehealth: Payer: Self-pay | Admitting: *Deleted

## 2019-02-24 DIAGNOSIS — I10 Essential (primary) hypertension: Secondary | ICD-10-CM

## 2019-02-24 DIAGNOSIS — E1165 Type 2 diabetes mellitus with hyperglycemia: Secondary | ICD-10-CM

## 2019-02-24 DIAGNOSIS — E7800 Pure hypercholesterolemia, unspecified: Secondary | ICD-10-CM

## 2019-02-24 NOTE — Progress Notes (Signed)
Orders placed for f/u labs.  

## 2019-02-24 NOTE — Telephone Encounter (Signed)
Labs ordered.  Needs to be changed to a fasting lab appt.

## 2019-02-24 NOTE — Telephone Encounter (Signed)
Please place future orders for lab appt.  

## 2019-02-25 NOTE — Telephone Encounter (Signed)
Appt changed to fasting labs

## 2019-02-26 ENCOUNTER — Other Ambulatory Visit: Payer: Self-pay

## 2019-03-12 ENCOUNTER — Other Ambulatory Visit: Payer: Self-pay

## 2019-03-12 ENCOUNTER — Other Ambulatory Visit (INDEPENDENT_AMBULATORY_CARE_PROVIDER_SITE_OTHER): Payer: Self-pay

## 2019-03-12 DIAGNOSIS — E7800 Pure hypercholesterolemia, unspecified: Secondary | ICD-10-CM

## 2019-03-12 DIAGNOSIS — E1165 Type 2 diabetes mellitus with hyperglycemia: Secondary | ICD-10-CM

## 2019-03-12 LAB — HEPATIC FUNCTION PANEL
ALT: 16 U/L (ref 0–35)
AST: 12 U/L (ref 0–37)
Albumin: 4.2 g/dL (ref 3.5–5.2)
Alkaline Phosphatase: 55 U/L (ref 39–117)
Bilirubin, Direct: 0.1 mg/dL (ref 0.0–0.3)
Total Bilirubin: 0.5 mg/dL (ref 0.2–1.2)
Total Protein: 6.8 g/dL (ref 6.0–8.3)

## 2019-03-12 LAB — BASIC METABOLIC PANEL
BUN: 26 mg/dL — ABNORMAL HIGH (ref 6–23)
CO2: 30 mEq/L (ref 19–32)
Calcium: 9.8 mg/dL (ref 8.4–10.5)
Chloride: 100 mEq/L (ref 96–112)
Creatinine, Ser: 0.77 mg/dL (ref 0.40–1.20)
GFR: 75.25 mL/min (ref >60.00)
Glucose, Bld: 153 mg/dL — ABNORMAL HIGH (ref 70–99)
Potassium: 4.5 mEq/L (ref 3.5–5.1)
Sodium: 139 mEq/L (ref 135–145)

## 2019-03-12 LAB — LIPID PANEL
Cholesterol: 123 mg/dL (ref 0–200)
HDL: 50.4 mg/dL (ref >39.00)
LDL Cholesterol: 43 mg/dL (ref 0–99)
NonHDL: 72.76
Total CHOL/HDL Ratio: 2
Triglycerides: 147 mg/dL (ref 0.0–149.0)
VLDL: 29.4 mg/dL (ref 0.0–40.0)

## 2019-03-12 LAB — HEMOGLOBIN A1C: Hgb A1c MFr Bld: 7.4 % — ABNORMAL HIGH (ref 4.6–6.5)

## 2019-03-12 LAB — MICROALBUMIN / CREATININE URINE RATIO
Creatinine,U: 84.8 mg/dL
Microalb Creat Ratio: 2 mg/g (ref 0.0–30.0)
Microalb, Ur: 1.7 mg/dL (ref 0.0–1.9)

## 2019-03-26 ENCOUNTER — Other Ambulatory Visit: Payer: Self-pay | Admitting: Internal Medicine

## 2019-04-12 ENCOUNTER — Other Ambulatory Visit: Payer: Self-pay | Admitting: Internal Medicine

## 2019-04-22 ENCOUNTER — Other Ambulatory Visit: Payer: Self-pay | Admitting: Family

## 2019-05-08 ENCOUNTER — Other Ambulatory Visit: Payer: Self-pay | Admitting: *Deleted

## 2019-05-08 DIAGNOSIS — Z20822 Contact with and (suspected) exposure to covid-19: Secondary | ICD-10-CM

## 2019-05-10 LAB — NOVEL CORONAVIRUS, NAA: SARS-CoV-2, NAA: NOT DETECTED

## 2019-06-09 ENCOUNTER — Other Ambulatory Visit: Payer: Self-pay | Admitting: Internal Medicine

## 2019-07-12 ENCOUNTER — Other Ambulatory Visit: Payer: Self-pay | Admitting: Internal Medicine

## 2019-07-19 ENCOUNTER — Encounter: Payer: Self-pay | Admitting: Internal Medicine

## 2019-07-29 ENCOUNTER — Other Ambulatory Visit: Payer: Self-pay | Admitting: Family

## 2019-08-08 ENCOUNTER — Other Ambulatory Visit: Payer: Self-pay | Admitting: Internal Medicine

## 2019-09-24 ENCOUNTER — Encounter: Payer: Self-pay | Admitting: *Deleted

## 2019-09-24 ENCOUNTER — Other Ambulatory Visit: Payer: Self-pay | Admitting: Internal Medicine

## 2019-09-24 NOTE — Telephone Encounter (Signed)
Pt called and scheduled an appt for 10/09/19

## 2019-10-01 ENCOUNTER — Other Ambulatory Visit: Payer: Self-pay

## 2019-10-01 ENCOUNTER — Telehealth: Payer: Self-pay | Admitting: Internal Medicine

## 2019-10-01 ENCOUNTER — Other Ambulatory Visit: Payer: Self-pay | Admitting: Internal Medicine

## 2019-10-01 MED ORDER — LISINOPRIL 20 MG PO TABS: 20.0000 mg | ORAL_TABLET | Freq: Every day | ORAL | 0 refills | Status: DC

## 2019-10-01 NOTE — Telephone Encounter (Signed)
Rx refilled. Must keep f/u appt

## 2019-10-01 NOTE — Telephone Encounter (Signed)
Pt needs a refill on lisinopril 20mg . Pharmacy denied refill. Pt has appt on 10/06/19. Please send over refill. Thanks!

## 2019-10-01 NOTE — Telephone Encounter (Signed)
Pt made appt. Pt needs a refill for medication listed below.   Pharmacy is [facility] [address] - [place], [place] - [address]  Call pt @ [phone]

## 2019-10-06 ENCOUNTER — Telehealth: Payer: Self-pay | Admitting: Internal Medicine

## 2019-10-06 ENCOUNTER — Encounter: Payer: Self-pay | Admitting: Internal Medicine

## 2019-10-06 ENCOUNTER — Ambulatory Visit (INDEPENDENT_AMBULATORY_CARE_PROVIDER_SITE_OTHER): Payer: Medicare Other | Admitting: Internal Medicine

## 2019-10-06 ENCOUNTER — Other Ambulatory Visit: Payer: Self-pay

## 2019-10-06 VITALS — BP 139/75 | HR 68 | Ht 63.0 in | Wt 310.0 lb

## 2019-10-06 DIAGNOSIS — I4891 Unspecified atrial fibrillation: Secondary | ICD-10-CM

## 2019-10-06 DIAGNOSIS — E7800 Pure hypercholesterolemia, unspecified: Secondary | ICD-10-CM | POA: Diagnosis not present

## 2019-10-06 DIAGNOSIS — E1165 Type 2 diabetes mellitus with hyperglycemia: Secondary | ICD-10-CM

## 2019-10-06 DIAGNOSIS — I10 Essential (primary) hypertension: Secondary | ICD-10-CM

## 2019-10-06 DIAGNOSIS — R928 Other abnormal and inconclusive findings on diagnostic imaging of breast: Secondary | ICD-10-CM

## 2019-10-06 DIAGNOSIS — M549 Dorsalgia, unspecified: Secondary | ICD-10-CM | POA: Insufficient documentation

## 2019-10-06 DIAGNOSIS — M5489 Other dorsalgia: Secondary | ICD-10-CM

## 2019-10-06 DIAGNOSIS — G4733 Obstructive sleep apnea (adult) (pediatric): Secondary | ICD-10-CM

## 2019-10-06 DIAGNOSIS — K219 Gastro-esophageal reflux disease without esophagitis: Secondary | ICD-10-CM

## 2019-10-06 NOTE — Progress Notes (Signed)
Patient ID: Christine Robertson, female   DOB: 16-Dec-1955, 64 y.o.   MRN: [id number]   Virtual Visit via video Note  This visit type was conducted due to national recommendations for restrictions regarding the COVID-19 pandemic (e.g. social distancing).  This format is felt to be most appropriate for this patient at this time.  All issues noted in this document were discussed and addressed.  No physical exam was performed (except for noted visual exam findings with Video Visits).   I connected with Wilfred Curtis by a video enabled telemedicine application and verified that I am speaking with the correct person using two identifiers. Location patient: home Location provider: work Persons participating in the virtual visit: patient, provider  The limitations, risks, security and privacy concerns of performing an evaluation and management service by video and the availability of in person appointments have been discussed. The patient expressed understanding and agreed to proceed.   Reason for visit: scheduled follow up.   HPI: She reports she is doing relatively well.  Seeing Dr Ubaldo Glassing for f/u afib.  On flecainide, metoprolol and aspirin.  Overall feels is stable.  States am sugars averaging 113-145 and pm 123.  No chest pain.  Breathing overall stable.  States her cpap machine is old.  Is concerned is leaking.  Reports "tubes pop out".  Off and on not working right.  No acid reflux reported.  Blood pressure varying.  Recent readings 134-144/70s.  Has noticed right lower back pain.  Some increased frequency.  No dysuria or hematuria.  No fever, nausea or vomiting.  Not riding recumbent bike.  States had flu and pneumonia vaccine at [facility].  No record.  Obtain.     ROS: See pertinent positives and negatives per HPI.  Past Medical History:  Diagnosis Date  . Abnormal results of thyroid function studies   . Allergy    hay fever  . Angina pectoris, unspecified ([place])   . Arthritis   . Atrial  fibrillation, unspecified   . Chicken pox   . Cold hands   . Congenital anomaly of adrenal gland   . Diabetes mellitus    type 2, uncomplicated  . Essential hypertension, benign   . GERD (gastroesophageal reflux disease)   . IBS (irritable bowel syndrome)   . Malaise   . Morbid obesity ([place])   . Murmur   . Pain in soft tissues of limb   . Proteinuria   . Pure hypercholesterolemia   . Rash and other nonspecific skin eruption   . Sleep apnea    Obstructive; Uses C-Pap machine  . Urinary incontinence     Past Surgical History:  Procedure Laterality Date  . ABDOMINAL HYSTERECTOMY  2010  . BLADDER SURGERY  1967   stretching of bladder  . BREAST BIOPSY Left 03/28/2015   done at Dr. Dwyane Luo office. fibrocystic changes  . COLONOSCOPY    . COLONOSCOPY WITH PROPOFOL N/A 09/09/2015   Procedure: COLONOSCOPY WITH PROPOFOL;  Surgeon: Manya Silvas, MD;  [facility] END[facility]COPY;  Service: Endoscopy;  Laterality: N/A;  . ESOPHAGOGASTRODUODENOSCOPY    . TONSILLECTOMY AND ADENOIDECTOMY  1959  . URETHRAL DILATION      Family History  Problem Relation Age of Onset  . Diabetes Mother   . Cancer Father        lung  . Breast cancer Maternal Grandmother 60  . Breast cancer Maternal Aunt 63    SOCIAL HX: reviewed.    Current Outpatient Medications:  .  aspirin 81 MG tablet, Take 81 mg by mouth daily., Disp: , Rfl:  .  beclomethasone (QVAR) 40 MCG/ACT inhaler, Inhale 1 puff into the lungs 2 (two) times daily., Disp: , Rfl:  .  cetirizine (ZYRTEC) 10 MG tablet, Take 10 mg by mouth daily., Disp: , Rfl:  .  clotrimazole-betamethasone (LOTRISONE) cream, Apply 1 application topically 2 (two) times daily., Disp: 30 g, Rfl: 0 .  flecainide (TAMBOCOR) 100 MG tablet, TAKE 1 TABLET BY MOUTH TWICE DAILY, Disp: , Rfl:  .  glimepiride (AMARYL) 2 MG tablet, TAKE ONE TABLET BY MOUTH TWICE A DAY, Disp: 180 tablet, Rfl: 1 .  hydrochlorothiazide (HYDRODIURIL) 25 MG tablet, TAKE ONE TABLET BY MOUTH  DAILY, Disp: 90 tablet, Rfl: 1 .  lisinopril (ZESTRIL) 20 MG tablet, Take 1 tablet (20 mg total) by mouth daily., Disp: 90 tablet, Rfl: 0 .  metFORMIN (GLUCOPHAGE) 1000 MG tablet, TAKE ONE TABLET BY MOUTH TWICE A DAY WITH MEALS, Disp: 180 tablet, Rfl: 0 .  metoprolol tartrate (LOPRESSOR) 25 MG tablet, Take 25 mg by mouth 2 (two) times daily., Disp: , Rfl:  .  Multiple Vitamin (MULTIVITAMIN) tablet, Take 1 tablet by mouth daily., Disp: , Rfl:  .  omeprazole (PRILOSEC) 20 MG capsule, TAKE 1 CAPSULE BY MOUTH TWICE DAILY (Patient taking differently: TAKE 1 CAPSULE BY MOUTH  DAILY), Disp: 60 capsule, Rfl: 5 .  oxybutynin (DITROPAN) 5 MG tablet, TAKE ONE TABLET BY MOUTH TWICE A DAY, Disp: 180 tablet, Rfl: 0 .  rosuvastatin (CRESTOR) 10 MG tablet, TAKE ONE TABLET BY MOUTH DAILY, Disp: 90 tablet, Rfl: 3 .  triamcinolone cream (KENALOG) 0.1 %, Apply 1 application topically 2 (two) times daily., Disp: 60 g, Rfl: 0  EXAM:  VITALS per patient if applicable: 710/62  GENERAL: alert, oriented, appears well and in no acute distress  HEENT: atraumatic, conjunttiva clear, no obvious abnormalities on inspection of external nose and ears  NECK: normal movements of the head and neck  LUNGS: on inspection no signs of respiratory distress, breathing rate appears normal, no obvious gross SOB, gasping or wheezing  CV: no obvious cyanosis  PSYCH/NEURO: pleasant and cooperative, no obvious depression or anxiety, speech and thought processing grossly intact  ASSESSMENT AND PLAN:  Discussed the following assessment and plan:  Abnormal mammogram of left breast Has seen Dr Bary Castilla previously.  S/p breat biopsy x 2.  Last mammogram 09/05/18 - recommended diagnostic mammogram in one year.  Need to schedule.    Atrial fibrillation Followed by Dr Ubaldo Glassing.  Stable.  Continue flecainide, metoprolol and aspirin.    Back pain Right low back pain as outlined.  Some increased frequency.  No dysuria or hematuria.  Check urine  to confirm no infection.  Tylenol.  Follow.  Notify if persistent pain.   Diabetes mellitus Low carb diet and exercise.  Follow met b and a1c.    GERD (gastroesophageal reflux disease) Controlled on current regimen.  Follow.    Hypercholesteremia Low cholesterol diet and exercise.  Follow lipid panel and liver function tests.    Hypertension Blood pressure as outlined.  Have her spot check her pressure.  Lisinopril '20mg'$ .  Follow pressures.  Follow metabolic panel.   Obesity, morbid, BMI 50 or higher (HCC) Diet, exercise and weight loss.  Follow.   Obstructive sleep apnea Has old machine.  States - leaks.  Tubing pops - out.  See if can contact about a new machine.     Orders Placed This Encounter  Procedures  .  Urine Culture    Standing Status:   Future    Standing Expiration Date:   10/05/2020  . CBC with Differential/Platelet    Standing Status:   Future    Standing Expiration Date:   10/05/2020  . Hemoglobin A1c    Standing Status:   Future    Standing Expiration Date:   10/05/2020  . Hepatic function panel    Standing Status:   Future    Standing Expiration Date:   10/05/2020  . Lipid panel    Standing Status:   Future    Standing Expiration Date:   10/05/2020  . TSH    Standing Status:   Future    Standing Expiration Date:   10/05/2020  . Basic metabolic panel    Standing Status:   Future    Standing Expiration Date:   10/05/2020  . Urinalysis, Routine w reflex microscopic    Standing Status:   Future    Standing Expiration Date:   10/05/2020    No orders of the defined types were placed in this encounter.    I discussed the assessment and treatment plan with the patient. The patient was provided an opportunity to ask questions and all were answered. The patient agreed with the plan and demonstrated an understanding of the instructions.   The patient was advised to call back or seek an in-person evaluation if the symptoms worsen or if the condition fails to improve as  anticipated.   Einar Pheasant, MD

## 2019-10-06 NOTE — Telephone Encounter (Signed)
Unable to lm to schedule fasting labs this week and a yearly follow up in 54m

## 2019-10-09 ENCOUNTER — Other Ambulatory Visit: Payer: Self-pay

## 2019-10-11 ENCOUNTER — Encounter: Payer: Self-pay | Admitting: Internal Medicine

## 2019-10-11 ENCOUNTER — Telehealth: Payer: Self-pay | Admitting: Internal Medicine

## 2019-10-11 DIAGNOSIS — G4739 Other sleep apnea: Secondary | ICD-10-CM

## 2019-10-11 NOTE — Telephone Encounter (Signed)
Pt with known sleep apnea.  Has cpap.  States machine is leaking and tubing popping out.  Needs new machine.  Thinks has sleep med.  Need to contact about what needs to be done to get a new machine.

## 2019-10-11 NOTE — Assessment & Plan Note (Signed)
Controlled on current regimen.  Follow.  

## 2019-10-11 NOTE — Assessment & Plan Note (Signed)
Low cholesterol diet and exercise.  Follow lipid panel and liver function tests.  

## 2019-10-11 NOTE — Assessment & Plan Note (Signed)
Right low back pain as outlined.  Some increased frequency.  No dysuria or hematuria.  Check urine to confirm no infection.  Tylenol.  Follow.  Notify if persistent pain.

## 2019-10-11 NOTE — Assessment & Plan Note (Signed)
Diet, exercise and weight loss.  Follow.   

## 2019-10-11 NOTE — Assessment & Plan Note (Signed)
Followed by Dr Lady Gary.  Stable.  Continue flecainide, metoprolol and aspirin.

## 2019-10-11 NOTE — Assessment & Plan Note (Signed)
Blood pressure as outlined.  Have her spot check her pressure.  Lisinopril 20mg .  Follow pressures.  Follow metabolic panel.

## 2019-10-11 NOTE — Assessment & Plan Note (Signed)
Low carb diet and exercise.  Follow met b and a1c.   

## 2019-10-11 NOTE — Assessment & Plan Note (Signed)
Has old machine.  States - leaks.  Tubing pops - out.  See if can contact about a new machine.

## 2019-10-11 NOTE — Assessment & Plan Note (Signed)
Has seen Dr Lemar Livings previously.  S/p breat biopsy x 2.  Last mammogram 09/05/18 - recommended diagnostic mammogram in one year.  Need to schedule.

## 2019-10-13 ENCOUNTER — Other Ambulatory Visit: Payer: Self-pay

## 2019-10-13 ENCOUNTER — Other Ambulatory Visit (INDEPENDENT_AMBULATORY_CARE_PROVIDER_SITE_OTHER): Payer: Medicare Other

## 2019-10-13 DIAGNOSIS — I10 Essential (primary) hypertension: Secondary | ICD-10-CM | POA: Diagnosis not present

## 2019-10-13 DIAGNOSIS — M5489 Other dorsalgia: Secondary | ICD-10-CM | POA: Diagnosis not present

## 2019-10-13 DIAGNOSIS — E7800 Pure hypercholesterolemia, unspecified: Secondary | ICD-10-CM | POA: Diagnosis not present

## 2019-10-13 DIAGNOSIS — E1165 Type 2 diabetes mellitus with hyperglycemia: Secondary | ICD-10-CM | POA: Diagnosis not present

## 2019-10-13 LAB — LIPID PANEL
Cholesterol: 125 mg/dL (ref 0–200)
HDL: 49.7 mg/dL (ref >39.00)
LDL Cholesterol: 49 mg/dL (ref 0–99)
NonHDL: 75.51
Total CHOL/HDL Ratio: 3
Triglycerides: 135 mg/dL (ref 0.0–149.0)
VLDL: 27 mg/dL (ref 0.0–40.0)

## 2019-10-13 LAB — CBC WITH DIFFERENTIAL/PLATELET
Basophils Absolute: 0 10*3/uL (ref 0.0–0.1)
Basophils Relative: 0.5 % (ref 0.0–3.0)
Eosinophils Absolute: 0.2 10*3/uL (ref 0.0–0.7)
Eosinophils Relative: 2.6 % (ref 0.0–5.0)
HCT: 37.5 % (ref 36.0–46.0)
Hemoglobin: 12.3 g/dL (ref 12.0–15.0)
Lymphocytes Relative: 20 % (ref 12.0–46.0)
Lymphs Abs: 1.7 10*3/uL (ref 0.7–4.0)
MCHC: 32.7 g/dL (ref 30.0–36.0)
MCV: 87.3 fl (ref 78.0–100.0)
Monocytes Absolute: 0.5 10*3/uL (ref 0.1–1.0)
Monocytes Relative: 5.8 % (ref 3.0–12.0)
Neutro Abs: 6.1 10*3/uL (ref 1.4–7.7)
Neutrophils Relative %: 71.1 % (ref 43.0–77.0)
Platelets: 228 10*3/uL (ref 150.0–400.0)
RBC: 4.3 Mil/uL (ref 3.87–5.11)
RDW: 13.8 % (ref 11.5–15.5)
WBC: 8.6 10*3/uL (ref 4.0–10.5)

## 2019-10-13 LAB — URINALYSIS, ROUTINE W REFLEX MICROSCOPIC
Bilirubin Urine: NEGATIVE
Hgb urine dipstick: NEGATIVE
Ketones, ur: NEGATIVE
Leukocytes,Ua: NEGATIVE
Nitrite: NEGATIVE
RBC / HPF: NONE SEEN (ref 0–?)
Specific Gravity, Urine: 1.02 (ref 1.000–1.030)
Total Protein, Urine: NEGATIVE
Urine Glucose: NEGATIVE
Urobilinogen, UA: 0.2 (ref 0.0–1.0)
pH: 6 (ref 5.0–8.0)

## 2019-10-13 LAB — TSH: TSH: 0.87 u[IU]/mL (ref 0.35–4.50)

## 2019-10-13 LAB — HEMOGLOBIN A1C: Hgb A1c MFr Bld: 8.8 % — ABNORMAL HIGH (ref 4.6–6.5)

## 2019-10-13 LAB — HEPATIC FUNCTION PANEL
ALT: 18 U/L (ref 0–35)
AST: 16 U/L (ref 0–37)
Albumin: 4.1 g/dL (ref 3.5–5.2)
Alkaline Phosphatase: 54 U/L (ref 39–117)
Bilirubin, Direct: 0.1 mg/dL (ref 0.0–0.3)
Total Bilirubin: 0.5 mg/dL (ref 0.2–1.2)
Total Protein: 7 g/dL (ref 6.0–8.3)

## 2019-10-13 LAB — BASIC METABOLIC PANEL
BUN: 20 mg/dL (ref 6–23)
CO2: 31 mEq/L (ref 19–32)
Calcium: 9.7 mg/dL (ref 8.4–10.5)
Chloride: 101 mEq/L (ref 96–112)
Creatinine, Ser: 0.73 mg/dL (ref 0.40–1.20)
GFR: 79.88 mL/min (ref >60.00)
Glucose, Bld: 201 mg/dL — ABNORMAL HIGH (ref 70–99)
Potassium: 4.2 mEq/L (ref 3.5–5.1)
Sodium: 139 mEq/L (ref 135–145)

## 2019-10-14 ENCOUNTER — Other Ambulatory Visit: Payer: Self-pay | Admitting: Internal Medicine

## 2019-10-14 LAB — URINE CULTURE
MICRO NUMBER:: 10132334
SPECIMEN QUALITY:: ADEQUATE

## 2019-10-16 ENCOUNTER — Ambulatory Visit: Payer: Medicare Other | Attending: Internal Medicine

## 2019-10-16 DIAGNOSIS — Z23 Encounter for immunization: Secondary | ICD-10-CM | POA: Insufficient documentation

## 2019-10-16 NOTE — Progress Notes (Signed)
   Covid-19 Vaccination Clinic  Name:  GIANNIE SOLIDAY    MRN: [id number] DOB: November 05, 1955  10/16/2019  Ms. Bryngelson was observed post Covid-19 immunization for 15 minutes without incidence. She was provided with Vaccine Information Sheet and instruction to access the V-Safe system.   Ms. Schaad was instructed to call 911 with any severe reactions post vaccine: Marland Kitchen Difficulty breathing  . Swelling of your face and throat  . A fast heartbeat  . A bad rash all over your body  . Dizziness and weakness    Immunizations Administered    Name Date Dose VIS Date Route   Pfizer COVID-19 Vaccine 10/16/2019 12:25 PM 0.3 mL 08/14/2019 Intramuscular   Manufacturer: [employer], [employer]   Lot: [id number]   NDC: [address]-6

## 2019-10-17 ENCOUNTER — Other Ambulatory Visit: Payer: Self-pay | Admitting: Internal Medicine

## 2019-10-17 DIAGNOSIS — E1165 Type 2 diabetes mellitus with hyperglycemia: Secondary | ICD-10-CM

## 2019-10-17 NOTE — Progress Notes (Signed)
Order placed for CCM referral.  

## 2019-10-19 ENCOUNTER — Telehealth: Payer: Self-pay | Admitting: Internal Medicine

## 2019-10-19 NOTE — Chronic Care Management (AMB) (Signed)
  Chronic Care Management   Note  10/19/2019 Name: Christine Robertson MRN: [id number] DOB: 1956/04/07  Christine Robertson is a 64 y.o. year old female who is a primary care patient of Einar Pheasant, MD. I reached out to Ellin Goodie by phone today in response to a referral sent by [name]'s PCP, Dr. Einar Pheasant     [name] was given information about Chronic Care Management services today including:  1. CCM service includes personalized support from designated clinical staff supervised by her physician, including individualized plan of care and coordination with other care providers 2. 24/7 contact phone numbers for assistance for urgent and routine care needs. 3. Service will only be billed when office clinical staff spend 20 minutes or more in a month to coordinate care. 4. Only one practitioner may furnish and bill the service in a calendar month. 5. The patient may stop CCM services at any time (effective at the end of the month) by phone call to the office staff. 6. The patient will be responsible for cost sharing (co-pay) of up to 20% of the service fee (after annual deductible is met).  Patient agreed to services and verbal consent obtained.   Follow up plan: Telephone appointment with care management team member scheduled for:11/19/2019  Glenna Durand, LPN Health Advisor, [facility] ??[email or web address] ??[phone]

## 2019-10-20 NOTE — Telephone Encounter (Signed)
Will need to clarify settings.  I could not find the information with the current settings.

## 2019-10-20 NOTE — Telephone Encounter (Signed)
Form filled out for new machine and supplies through [facility] ([facility] now.) Will confirm pressure settings with pt. Last setting we have is 12. Placed in quick sign for signature

## 2019-10-21 NOTE — Telephone Encounter (Signed)
Faxed form to [facility]

## 2019-10-21 NOTE — Telephone Encounter (Signed)
Patient stated her current setting is 15.

## 2019-10-27 ENCOUNTER — Other Ambulatory Visit: Payer: Self-pay | Admitting: Family

## 2019-10-27 ENCOUNTER — Other Ambulatory Visit: Payer: Self-pay | Admitting: Internal Medicine

## 2019-11-11 ENCOUNTER — Ambulatory Visit: Payer: Medicare Other | Attending: Internal Medicine

## 2019-11-11 DIAGNOSIS — Z23 Encounter for immunization: Secondary | ICD-10-CM | POA: Insufficient documentation

## 2019-11-11 NOTE — Progress Notes (Signed)
   Covid-19 Vaccination Clinic  Name:  Christine Robertson    MRN: [id number] DOB: 03-May-1956  11/11/2019  [name] was observed post Covid-19 immunization for 15 minutes without incident. She was provided with Vaccine Information Sheet and instruction to access the V-Safe system.   [name] was instructed to call 911 with any severe reactions post vaccine: Marland Kitchen Difficulty breathing  . Swelling of face and throat  . A fast heartbeat  . A bad rash all over body  . Dizziness and weakness   Immunizations Administered    Name Date Dose VIS Date Route   Pfizer COVID-19 Vaccine 11/11/2019 12:12 PM 0.3 mL 08/14/2019 Intramuscular   Manufacturer: [employer], [employer]   Lot: [id number]   NDC: [address]-4

## 2019-11-19 ENCOUNTER — Encounter: Payer: Self-pay | Admitting: Pharmacist

## 2019-11-19 ENCOUNTER — Ambulatory Visit (INDEPENDENT_AMBULATORY_CARE_PROVIDER_SITE_OTHER): Payer: Medicare Other | Admitting: Pharmacist

## 2019-11-19 DIAGNOSIS — E7800 Pure hypercholesterolemia, unspecified: Secondary | ICD-10-CM | POA: Diagnosis not present

## 2019-11-19 DIAGNOSIS — E1165 Type 2 diabetes mellitus with hyperglycemia: Secondary | ICD-10-CM

## 2019-11-19 DIAGNOSIS — I4891 Unspecified atrial fibrillation: Secondary | ICD-10-CM

## 2019-11-19 DIAGNOSIS — R32 Unspecified urinary incontinence: Secondary | ICD-10-CM

## 2019-11-19 MED ORDER — OZEMPIC (0.25 OR 0.5 MG/DOSE) 2 MG/1.5ML ~~LOC~~ SOPN: 0.5000 mg | PEN_INJECTOR | SUBCUTANEOUS | 2 refills | Status: DC

## 2019-11-19 NOTE — Progress Notes (Signed)
Reviewed information.  Agree with assessment and plan.  Pt starting ozempic.    Dr Lorin Picket

## 2019-11-19 NOTE — Chronic Care Management (AMB) (Signed)
Chronic Care Management   Note  11/19/2019 Name: Christine Robertson MRN: [id number] DOB: [birth year]   Subjective:  Christine Robertson is a 64 y.o. year old female who is a primary care patient of Einar Pheasant, MD. The CCM team was consulted for assistance with chronic disease management and care coordination needs.    Contacted patient for medication management review.   Review of patient status, including review of consultants reports, laboratory and other test data, was performed as part of comprehensive evaluation and provision of chronic care management services.   SDOH (Social Determinants of Health) assessments and interventions performed:  yes  Objective:  Lab Results  Component Value Date   CREATININE 0.73 10/13/2019   CREATININE 0.77 03/12/2019   CREATININE 0.79 06/05/2018    Lab Results  Component Value Date   HGBA1C 8.8 (H) 10/13/2019       Component Value Date/Time   CHOL 125 10/13/2019 1053   TRIG 135.0 10/13/2019 1053   HDL 49.70 10/13/2019 1053   CHOLHDL 3 10/13/2019 1053   VLDL 27.0 10/13/2019 1053   LDLCALC 49 10/13/2019 1053   LDLDIRECT 101.0 06/05/2018 0918    Clinical ASCVD: No  The ASCVD Risk score Mikey Bussing DC Jr., et al., 2013) failed to calculate for the following reasons:   The valid total cholesterol range is 130 to 320 mg/dL    BP Readings from Last 3 Encounters:  10/11/19 139/75  05/23/18 138/72  01/23/18 (!) 154/87    Allergies  Allergen Reactions  . Erythromycin Hives  . Augmentin [Amoxicillin-Pot Clavulanate] Hives  . Demerol [Meperidine] Nausea Only    Medications Reviewed Today    Reviewed by De Hollingshead, [facility] (Pharmacist) on 11/19/19 at 1110  Med List Status: <None>  Medication Order Taking? Sig Documenting Provider Last Dose Status Informant  Ascorbic Acid (VITAMIN C) 500 MG CAPS [id number] Yes Take 500 mg by mouth daily. [provider] Taking Active   aspirin 81 MG tablet [id number] Yes Take 81 mg by mouth  daily. [provider] Taking Active Self  beclomethasone (QVAR) 40 MCG/ACT inhaler [id number] No Inhale 1 puff into the lungs 2 (two) times daily. [provider] Not Taking Active   cetirizine (ZYRTEC) 10 MG tablet [id number] Yes Take 10 mg by mouth daily. [provider] Taking Active Self       Patient not taking:      Discontinued 11/19/19 1110 (Completed Course)   flecainide (TAMBOCOR) 100 MG tablet [id number] Yes TAKE 1 TABLET BY MOUTH TWICE DAILY [provider] Taking Active   glimepiride (AMARYL) 2 MG tablet [id number] Yes TAKE ONE TABLET BY MOUTH TWICE A Maxcine Ham, Charlene, MD Taking Active   hydrochlorothiazide (HYDRODIURIL) 25 MG tablet [id number] Yes TAKE ONE TABLET BY MOUTH DAILY Einar Pheasant, MD Taking Active   lisinopril (ZESTRIL) 20 MG tablet [id number] Yes Take 1 tablet (20 mg total) by mouth daily. Einar Pheasant, MD Taking Active   metFORMIN (GLUCOPHAGE) 1000 MG tablet [id number] Yes TAKE ONE TABLET BY MOUTH TWICE A DAY WITH MEALS Arnett, Yvetta Coder, FNP Taking Active   metoprolol tartrate (LOPRESSOR) 25 MG tablet [id number] Yes Take 25 mg by mouth 2 (two) times daily. Teodoro Spray, MD Taking Active   Multiple Vitamin (MULTIVITAMIN) tablet [id number] Yes Take 1 tablet by mouth daily. [provider] Taking Active Self  omeprazole (PRILOSEC) 20 MG capsule [id number] Yes TAKE 1 CAPSULE BY MOUTH TWICE DAILY  Patient taking differently: TAKE 1 CAPSULE BY MOUTH  DAILY  Scott, Charlene, MD Taking Active            Med Note (TRAVIS, CATHERINE E   Thu Nov 19, 2019 11:08 AM) Once daily   oxybutynin (DITROPAN) 5 MG tablet [id number] Yes TAKE ONE TABLET BY MOUTH TWICE A DAY Scott, Charlene, MD Taking Active   rosuvastatin (CRESTOR) 10 MG tablet [id number] Yes TAKE ONE TABLET BY MOUTH DAILY Scott, Charlene, MD Taking Active   triamcinolone cream (KENALOG) 0.1 % [id number] No Apply 1 application topically 2 (two) times daily.  Patient not taking:  Reported on 11/19/2019   Scott, Charlene, MD Not Taking Active            Assessment:   Goals Addressed            This Visit's Progress     Patient Stated   . "I want to be healthy" (pt-stated)       CARE PLAN ENTRY (see longtitudinal plan of care for additional care plan information)  Current Barriers:  . Diabetes: uncontrolled; complicated by chronic medical conditions including hx Afib, HLD, HTN, most recent A1c 8.8% . Most recent eGFR: ~80 . Current antihyperglycemic regimen: metformin 1000 mg BID, glimepiride 2 mg BID o Hx- pioglitazone; unsure why this was d/c o Notes that DPP4 was prescribed but was too expensive at the time, as she was uninsured . Denies hypoglycemic symptoms, including dizziness, lightheadedness, shaking, sweating . Reports consistent hyperglycemic symptoms; notes she has gained ~10 lbs in the past few months . Current meal patterns: o Breakfast: oatmeal + 2 eggs; splenda + butter o Lunch: sandwiches; recently has switched off of white bread and using whole wheat o Supper: gets a lot of take out; hamburgers, pasta o Snacks: occasional peanut butter crackers; though notes she isn't a snacker, it's more of a meal so  o Drinks: water; diet coke . Current exercise: none; limited by back pain . Current blood glucose readings:  o Fasting: 147-212 (more average 150-180s) o Pre-Supper: 150s . Cardiovascular risk reduction (afib, HTN) o Current hypertensive regimen: flecainide 100 mg BID, metoprolol 25 mg BID, lisinopril 20 mg, HCTZ 25 mg daily o Current hyperlipidemia regimen: rosuvastatin 10 mg daily; last LDL at goal <70 o Current antiplatelet regimen: ASA 81 mg (CHADS2VASC 3, but has been in NSR, Dr. Fath elected to continue ASA instead of DOAC) . Urinary incontinence: oxybutynin 5 mg BID   Pharmacist Clinical Goal(s):  . Over the next 90 days, patient will work with PharmD and primary care provider to address optimized medication  management  Interventions: . Comprehensive medication review performed, medication list updated in electronic medical record . Extensive review of plate method, carbohydrate reduction, choosing smaller portion sizes . Reviewed goal A1c, goal fasting, and goal 2 hour post prandial glucose readings. Encourage patient to shift checks from before breakfast to after breakfast . Discussed benefit of GLP1, especially weight loss. Start Ozempic 0.25 mg weekly x 4 weeks, then increase to 0.5 mg weekly. Discussed potential of GI side effects, and to focus on smaller portions sizes to reduce GI distress. She will f/u with Dr. Scott in 6 weeks about this. . Moving forward, will address urinary incontinence tx. Could consider switch from oxybutynin IR to XR to reduce risk of anticholinergic side effects, especially as patient moves into geriatric age  Patient Self Care Activities:  . Patient will check blood glucose BID, document, and provide at future appointments . Patient will take medications as prescribed . Patient will report any questions   or concerns to provider   Initial goal documentation        Plan: - Scheduled f/u call 01/25/20  Catie Travis, PharmD, BCACP, CPP Clinical Pharmacist  [facility] [phone]  

## 2019-11-19 NOTE — Patient Instructions (Signed)
Visit Information  Goals Addressed            This Visit's Progress     Patient Stated   . "I want to be healthy" (pt-stated)       CARE PLAN ENTRY (see longtitudinal plan of care for additional care plan information)  Current Barriers:  . Diabetes: uncontrolled; complicated by chronic medical conditions including hx Afib, HLD, HTN, most recent A1c 8.8% o Reports that besides diabetes, her biggest concern today is increased "sciatic nerve pain" down her R leg. This has resulted in her sleeping on her back instead of side to reduce pain, and occasional use of OTC ibuprofen.  . Most recent eGFR: ~80 . Current antihyperglycemic regimen: metformin 1000 mg BID, glimepiride 2 mg BID o Hx- pioglitazone; unsure why this was d/c o Notes that DPP4 was prescribed but was too expensive at the time, as she was uninsured . Denies hypoglycemic symptoms, including dizziness, lightheadedness, shaking, sweating . Reports consistent hyperglycemic symptoms; notes she has gained ~10 lbs in the past few months . Current meal patterns: o Breakfast: oatmeal + 2 eggs; splenda + butter o Lunch: sandwiches; recently has switched off of white bread and using whole wheat o Supper: gets a lot of take out; hamburgers, pasta o Snacks: occasional peanut butter crackers; though notes she isn't a snacker, it's more of a meal so  o Drinks: water; diet coke . Current exercise: none; limited by back pain . Current blood glucose readings:  o Fasting: 147-212 (more average 150-180s) o Pre-Supper: 150s . Cardiovascular risk reduction (afib, HTN) o Current hypertensive regimen: flecainide 100 mg BID, metoprolol 25 mg BID, lisinopril 20 mg, HCTZ 25 mg daily o Current hyperlipidemia regimen: rosuvastatin 10 mg daily; last LDL at goal <70 o Current antiplatelet regimen: ASA 81 mg (CHADS2VASC 3, but has been in NSR, Dr. Ubaldo Glassing elected to continue ASA instead of DOAC) . Urinary incontinence: oxybutynin 5 mg BID   Pharmacist  Clinical Goal(s):  Marland Kitchen Over the next 90 days, patient will work with PharmD and primary care provider to address optimized medication management  Interventions: . Comprehensive medication review performed, medication list updated in electronic medical record . Extensive review of plate method, carbohydrate reduction, choosing smaller portion sizes . Reviewed goal A1c, goal fasting, and goal 2 hour post prandial glucose readings. Encourage patient to shift checks from before breakfast to after breakfast . Discussed benefit of GLP1, especially weight loss. Start Ozempic 0.25 mg weekly x 4 weeks, then increase to 0.5 mg weekly. Discussed potential of GI side effects, and to focus on smaller portions sizes to reduce GI distress. She will f/u with Dr. Nicki Reaper in 6 weeks about this. . Moving forward, will address urinary incontinence tx. Could consider switch from oxybutynin IR to XR to reduce risk of anticholinergic side effects, especially as patient moves into geriatric age . Encouraged to discuss sciatic pain w/ PCP at next appointment. Encouraged to call if increased pain before then  Patient Self Care Activities:  . Patient will check blood glucose BID, document, and provide at future appointments . Patient will take medications as prescribed . Patient will report any questions or concerns to provider   Initial goal documentation        Patient verbalizes understanding of instructions provided today.    Plan: - Scheduled f/u call 01/25/20  Catie Darnelle Maffucci, PharmD, BCACP, CPP Clinical Pharmacist [place] [phone]

## 2019-11-30 ENCOUNTER — Other Ambulatory Visit: Payer: Self-pay | Admitting: Internal Medicine

## 2019-12-14 NOTE — Telephone Encounter (Signed)
Pt called in and she needs a full face mask with nasal pillows. Currently she only has just the nasal pillows and she needs a new prescription sent to them in order for them to change it.

## 2019-12-15 NOTE — Telephone Encounter (Signed)
Need copy of sleep study. Pt stated she had through [facility]. [facility] is stating pt is a new pt. Sent message to Victorino Dike to clarify where I need to send request for sleep study. Pt is aware.

## 2019-12-22 NOTE — Addendum Note (Signed)
Addended by: Charm Barges on: 12/22/2019 05:34 PM   Modules accepted: Orders

## 2019-12-22 NOTE — Telephone Encounter (Signed)
Spoke with [facility]. Received message from [place] with [facility].. Pt needs another sleep study. Unable to get results of first sleep study due to loss of record when [facility] transitioned to [facility]. Pt is agreeable to see pulmonary for a sleep consult.

## 2019-12-22 NOTE — Telephone Encounter (Signed)
Order placed for pulmonary referral.  

## 2019-12-25 ENCOUNTER — Other Ambulatory Visit: Payer: Self-pay | Admitting: Internal Medicine

## 2020-01-04 ENCOUNTER — Encounter: Payer: Medicare Other | Admitting: Internal Medicine

## 2020-01-08 ENCOUNTER — Ambulatory Visit (INDEPENDENT_AMBULATORY_CARE_PROVIDER_SITE_OTHER): Payer: Medicare Other | Admitting: Internal Medicine

## 2020-01-08 ENCOUNTER — Encounter: Payer: Self-pay | Admitting: Internal Medicine

## 2020-01-08 ENCOUNTER — Other Ambulatory Visit: Payer: Self-pay

## 2020-01-08 VITALS — BP 130/80 | HR 74 | Temp 97.9°F | Ht 63.0 in | Wt 306.2 lb

## 2020-01-08 DIAGNOSIS — I890 Lymphedema, not elsewhere classified: Secondary | ICD-10-CM

## 2020-01-08 DIAGNOSIS — M5489 Other dorsalgia: Secondary | ICD-10-CM

## 2020-01-08 DIAGNOSIS — E7800 Pure hypercholesterolemia, unspecified: Secondary | ICD-10-CM

## 2020-01-08 DIAGNOSIS — K219 Gastro-esophageal reflux disease without esophagitis: Secondary | ICD-10-CM

## 2020-01-08 DIAGNOSIS — I10 Essential (primary) hypertension: Secondary | ICD-10-CM

## 2020-01-08 DIAGNOSIS — I4891 Unspecified atrial fibrillation: Secondary | ICD-10-CM

## 2020-01-08 DIAGNOSIS — E1165 Type 2 diabetes mellitus with hyperglycemia: Secondary | ICD-10-CM

## 2020-01-08 DIAGNOSIS — R928 Other abnormal and inconclusive findings on diagnostic imaging of breast: Secondary | ICD-10-CM

## 2020-01-08 DIAGNOSIS — Z0000 Encounter for general adult medical examination without abnormal findings: Secondary | ICD-10-CM

## 2020-01-08 DIAGNOSIS — G4733 Obstructive sleep apnea (adult) (pediatric): Secondary | ICD-10-CM

## 2020-01-08 DIAGNOSIS — M7989 Other specified soft tissue disorders: Secondary | ICD-10-CM | POA: Diagnosis not present

## 2020-01-08 DIAGNOSIS — Z1231 Encounter for screening mammogram for malignant neoplasm of breast: Secondary | ICD-10-CM

## 2020-01-08 LAB — GLUCOSE, POCT (MANUAL RESULT ENTRY): POC Glucose: 128 mg/dl — AB (ref 70–99)

## 2020-01-08 NOTE — Assessment & Plan Note (Addendum)
Physical today 01/08/20.  Colonoscopy 09/2015.  Recommended f/u in 5 years.  Mammogram 09/05/18 - birads III.  Recommended f/u bilateral diagnostic mammogram in 1 year.  Mammogram ordered. Colonoscopy 09/2015 - recommended f/u colonoscopy in 5 years.

## 2020-01-08 NOTE — Progress Notes (Signed)
Patient ID: Christine Robertson, female   DOB: 02/20/1956, 64 y.o.   MRN: [id number]   Subjective:    Patient ID: Christine Robertson, female    DOB: Sep 08, 1955, 64 y.o.   MRN: [id number]  HPI This visit occurred during the SARS-CoV-2 public health emergency.  Safety protocols were in place, including screening questions prior to the visit, additional usage of staff PPE, and extensive cleaning of exam room while observing appropriate contact time as indicated for disinfecting solutions.  Patient with past history of diabetes, hypertension and hypercholesterolemia.  She comes in to day to follow up of these issues as well as for a complete physical exam.  She reports she is doing relatively well.  Some back pain.  Does limit her activity.  [name] regularly.  Discussed exercise.  Sees Dr Ubaldo Glassing for afib.  Feels is stable.  No chest pain, sob or increased heart rate/palpitations reported.  Using cpap.  In the process of being evaluated for a new machine.  Is scheduled to see pulmonary.  She is now on ozempic.  Tolerating.  Taking a stool softener to keep her bowels moving.  No abdominal pain.  States blood sugars now averaging in am - 125-130 (down from 159-199).  Discussed diet adjustment.  Taking prilosec for acid reflux.  Overall appears to be handling stress.    Past Medical History:  Diagnosis Date  . Abnormal results of thyroid function studies   . Allergy    hay fever  . Angina pectoris, unspecified ([place])   . Arthritis   . Atrial fibrillation, unspecified   . Chicken pox   . Cold hands   . Congenital anomaly of adrenal gland   . Diabetes mellitus    type 2, uncomplicated  . Essential hypertension, benign   . GERD (gastroesophageal reflux disease)   . IBS (irritable bowel syndrome)   . Malaise   . Morbid obesity ([place])   . Murmur   . Pain in soft tissues of limb   . Proteinuria   . Pure hypercholesterolemia   . Rash and other nonspecific skin eruption   . Sleep apnea    Obstructive; Uses C-Pap machine  . Urinary incontinence    Past Surgical History:  Procedure Laterality Date  . ABDOMINAL HYSTERECTOMY  2010  . BLADDER SURGERY  1967   stretching of bladder  . BREAST BIOPSY Left 03/28/2015   done at Dr. Dwyane Luo office. fibrocystic changes  . COLONOSCOPY    . COLONOSCOPY WITH PROPOFOL N/A 09/09/2015   Procedure: COLONOSCOPY WITH PROPOFOL;  Surgeon: Manya Silvas, MD;  Location[facility] ENDOSCOPY;  Service: Endoscopy;  Laterality: N/A;  . ESOPHAGOGASTRODUODENOSCOPY    . TONSILLECTOMY AND ADENOIDECTOMY  1959  . URETHRAL DILATION     Family History  Problem Relation Age of Onset  . Diabetes Mother   . Cancer Father        lung  . Breast cancer Maternal Grandmother 60  . Breast cancer Maternal Aunt 65   Social History   Socioeconomic History  . Marital status: Married    Spouse name: Not on file  . Number of children: Not on file  . Years of education: Not on file  . Highest education level: Not on file  Occupational History  . Not on file  Tobacco Use  . Smoking status: Never Smoker  . Smokeless tobacco: Never Used  Substance and Sexual Activity  . Alcohol use: No    Alcohol/week: 0.0 standard drinks  .  Drug use: No  . Sexual activity: Not on file  Other Topics Concern  . Not on file  Social History Narrative  . Not on file   Social Determinants of Health   Financial Resource Strain:   . Difficulty of Paying Living Expenses:   Food Insecurity:   . Worried About Charity fundraiser in the Last Year:   . Arboriculturist in the Last Year:   Transportation Needs:   . Film/video editor (Medical):   Marland Kitchen Lack of Transportation (Non-Medical):   Physical Activity:   . Days of Exercise per Week:   . Minutes of Exercise per Session:   Stress:   . Feeling of Stress :   Social Connections:   . Frequency of Communication with Friends and Family:   . Frequency of Social Gatherings with Friends and Family:   . Attends Religious  Services:   . Active Member of Clubs or Organizations:   . Attends Archivist Meetings:   Marland Kitchen Marital Status:     Outpatient Encounter Medications as of 01/08/2020  Medication Sig  . Ascorbic Acid (VITAMIN C) 500 MG CAPS Take 500 mg by mouth daily.  Marland Kitchen aspirin 81 MG tablet Take 81 mg by mouth daily.  . beclomethasone (QVAR) 40 MCG/ACT inhaler Inhale 1 puff into the lungs 2 (two) times daily.  . cetirizine (ZYRTEC) 10 MG tablet Take 10 mg by mouth daily.  . flecainide (TAMBOCOR) 100 MG tablet TAKE 1 TABLET BY MOUTH TWICE DAILY  . glimepiride (AMARYL) 2 MG tablet TAKE ONE TABLET BY MOUTH TWICE A DAY  . hydrochlorothiazide (HYDRODIURIL) 25 MG tablet TAKE ONE TABLET BY MOUTH DAILY  . lisinopril (ZESTRIL) 20 MG tablet TAKE ONE TABLET BY MOUTH DAILY  . metFORMIN (GLUCOPHAGE) 1000 MG tablet TAKE ONE TABLET BY MOUTH TWICE A DAY WITH MEALS  . metoprolol tartrate (LOPRESSOR) 25 MG tablet Take 25 mg by mouth 2 (two) times daily.  . Multiple Vitamin (MULTIVITAMIN) tablet Take 1 tablet by mouth daily.  Marland Kitchen omeprazole (PRILOSEC) 20 MG capsule TAKE 1 CAPSULE BY MOUTH TWICE DAILY (Patient taking differently: TAKE 1 CAPSULE BY MOUTH  DAILY)  . oxybutynin (DITROPAN) 5 MG tablet TAKE ONE TABLET BY MOUTH TWICE A DAY  . rosuvastatin (CRESTOR) 10 MG tablet TAKE ONE TABLET BY MOUTH DAILY  . Semaglutide,0.25 or 0.'5MG'$ /DOS, (OZEMPIC, 0.25 OR 0.5 MG/DOSE,) 2 MG/1.5ML SOPN Inject 0.5 mg into the skin once a week. Inject 0.25 mg once weekly into the skin x4 weeks, then increase to 0.5 mg weekly  . triamcinolone cream (KENALOG) 0.1 % Apply 1 application topically 2 (two) times daily.   No facility-administered encounter medications on file as of 01/08/2020.    Review of Systems  Constitutional: Negative for appetite change and unexpected weight change.  HENT: Negative for congestion and sinus pressure.   Eyes: Negative for pain and visual disturbance.  Respiratory: Negative for cough, chest tightness and  shortness of breath.   Cardiovascular: Positive for leg swelling. Negative for chest pain and palpitations.  Gastrointestinal: Negative for abdominal pain, diarrhea, nausea and vomiting.  Genitourinary: Negative for difficulty urinating and dysuria.  Musculoskeletal: Positive for back pain. Negative for joint swelling and myalgias.  Skin: Negative for color change and rash.  Neurological: Negative for dizziness, light-headedness and headaches.  Hematological: Negative for adenopathy. Does not bruise/bleed easily.  Psychiatric/Behavioral: Negative for agitation and dysphoric mood.       Objective:    Physical Exam Vitals reviewed.  Constitutional:      General: She is not in acute distress.    Appearance: Normal appearance. She is well-developed.  HENT:     Head: Normocephalic and atraumatic.     Right Ear: External ear normal.     Left Ear: External ear normal.  Eyes:     General: No scleral icterus.       Right eye: No discharge.        Left eye: No discharge.     Conjunctiva/sclera: Conjunctivae normal.  Neck:     Thyroid: No thyromegaly.  Cardiovascular:     Rate and Rhythm: Normal rate and regular rhythm.  Pulmonary:     Effort: No tachypnea, accessory muscle usage or respiratory distress.     Breath sounds: Normal breath sounds. No decreased breath sounds or wheezing.  Chest:     Breasts:        Right: No inverted nipple, mass, nipple discharge or tenderness (no axillary adenopathy).        Left: No inverted nipple, mass, nipple discharge or tenderness (no axilarry adenopathy).  Abdominal:     General: Bowel sounds are normal.     Palpations: Abdomen is soft.     Tenderness: There is no abdominal tenderness.  Musculoskeletal:        General: No tenderness.     Cervical back: Neck supple. No tenderness.     Comments: Lower extremity - edema.  Some stasis changes.    Lymphadenopathy:     Cervical: No cervical adenopathy.  Skin:    Findings: No erythema or rash.    Neurological:     Mental Status: She is alert and oriented to person, place, and time.  Psychiatric:        Mood and Affect: Mood normal.        Behavior: Behavior normal.     BP 130/80 (BP Location: Left Arm, Patient Position: Sitting, Cuff Size: Large)   Pulse 74   Temp 97.9 F (36.6 C) (Skin)   Ht '5\' 3"'$  (1.6 m)   Wt (!) 306 lb 3.2 oz (138.9 kg)   SpO2 97%   BMI 54.24 kg/m  Wt Readings from Last 3 Encounters:  01/08/20 (!) 306 lb 3.2 oz (138.9 kg)  10/06/19 (!) 310 lb (140.6 kg)  05/23/18 (!) 301 lb 8 oz (136.8 kg)     Lab Results  Component Value Date   WBC 8.6 10/13/2019   HGB 12.3 10/13/2019   HCT 37.5 10/13/2019   PLT 228.0 10/13/2019   GLUCOSE 201 (H) 10/13/2019   CHOL 125 10/13/2019   TRIG 135.0 10/13/2019   HDL 49.70 10/13/2019   LDLDIRECT 101.0 06/05/2018   LDLCALC 49 10/13/2019   ALT 18 10/13/2019   AST 16 10/13/2019   NA 139 10/13/2019   K 4.2 10/13/2019   CL 101 10/13/2019   CREATININE 0.73 10/13/2019   BUN 20 10/13/2019   CO2 31 10/13/2019   TSH 0.87 10/13/2019   INR 0.99 11/15/2012   HGBA1C 8.8 (H) 10/13/2019   MICROALBUR 1.7 03/12/2019    MM DIAG BREAST TOMO BILATERAL  Result Date: 09/05/2018 CLINICAL DATA:  One year follow-up of probably benign left breast calcifications. EXAM: DIGITAL DIAGNOSTIC BILATERAL MAMMOGRAM WITH CAD AND TOMO COMPARISON:  Previous exam(s). ACR Breast Density Category b: There are scattered areas of fibroglandular density. FINDINGS: Mammographically, there are no suspicious masses, areas of architectural distortion or new microcalcifications in either breast. There is a few mm group of benign-appearing calcifications in  the left breast lower inner quadrant, middle depth, stable from the prior mammograms. There is no associated mass. Mammographic images were processed with CAD. IMPRESSION: Stable probably benign left breast calcifications. No mammographic evidence of malignancy in either breast. RECOMMENDATION: Diagnostic  mammogram is suggested in 1 year. (Code:DM-B-01Y) I have discussed the findings and recommendations with the patient. Results were also provided in writing at the conclusion of the visit. If applicable, a reminder letter will be sent to the patient regarding the next appointment. BI-RADS CATEGORY  3: Probably benign. Electronically Signed   By: Fidela Salisbury M.D.   On: 09/05/2018 11:33       Assessment & Plan:   Problem List Items Addressed This Visit    Abnormal mammogram of left breast    Has seen Dr Bary Castilla previously.  S/p breat biopsy x 2.  Last mammogram 09/05/18 - recommended f/u diagnostic mammogram in one year.  Ordered.        Atrial fibrillation ([place])    Followed by Dr Ubaldo Glassing.  Continues on flecainide, metoprolol and aspirin.  Feels is stable.  Follow.        Back pain    Still some back issues as outlined.  Notify if desires further intervention.       Diabetes mellitus ([place])    Low carb diet and exercise.  Just started ozempic.  Tolerating.  Sugars improved - per her report - as outlined.  Continue ozempic.  Follow met b and a1c.       Relevant Orders   POCT Glucose (CBG) (Completed)   Hemoglobin A1c   GERD (gastroesophageal reflux disease)    On prilosec.       Health care maintenance    Physical today 01/08/20.  Colonoscopy 09/2015.  Recommended f/u in 5 years.  Mammogram 09/05/18 - birads III.  Recommended f/u bilateral diagnostic mammogram in 1 year.  Mammogram ordered. Colonoscopy 09/2015 - recommended f/u colonoscopy in 5 years.        Hypercholesteremia    Low cholesterol diet and exercise.  Follow lipid panel and liver function tests.  Continue crestor.       Relevant Orders   Hepatic function panel   Lipid panel   Hypertension    Blood pressure as outlined.  Continue metoprolol, hctz and lisinopril.  Follow pressures.  Follow metabolic panel.       Relevant Orders   CBC with Differential/Platelet   Basic metabolic panel   Lymphedema    Lymphedema  pump as outlined.        Obesity, morbid, BMI 50 or higher ([place])    Discussed diet and exercise.  Follow.        Obstructive sleep apnea    Has old machine.  Uses regularly.  Apparently some leakage and tubing - popping.  Has appt scheduled to see pulmonary to discuss.        Swelling of lower extremity    Has had persistent issues with lower extremity swelling.  Has seen vascular surgery.  Has lymphedema pump.  Not using.  Discussed using this on a regular basis.  Has not f/u scheduled at this time with vascular surgery.  Desires not to schedule.  Wants to start using the pump regularly.  Will call or f/u if persistent problems.         Other Visit Diagnoses    Encounter for screening mammogram for malignant neoplasm of breast    -  Primary   Abnormal mammogram  Relevant Orders   MM DIAG BREAST TOMO BILATERAL       Einar Pheasant, MD

## 2020-01-10 ENCOUNTER — Encounter: Payer: Self-pay | Admitting: Internal Medicine

## 2020-01-10 NOTE — Assessment & Plan Note (Signed)
Blood pressure as outlined.  Continue metoprolol, hctz and lisinopril.  Follow pressures.  Follow metabolic panel.  

## 2020-01-10 NOTE — Assessment & Plan Note (Signed)
Low cholesterol diet and exercise.  Follow lipid panel and liver function tests.  Continue crestor.  

## 2020-01-10 NOTE — Assessment & Plan Note (Signed)
Has old machine.  Uses regularly.  Apparently some leakage and tubing - popping.  Has appt scheduled to see pulmonary to discuss.

## 2020-01-10 NOTE — Assessment & Plan Note (Signed)
Discussed diet and exercise.  Follow.  

## 2020-01-10 NOTE — Assessment & Plan Note (Signed)
Low carb diet and exercise.  Just started ozempic.  Tolerating.  Sugars improved - per her report - as outlined.  Continue ozempic.  Follow met b and a1c.

## 2020-01-10 NOTE — Assessment & Plan Note (Signed)
Lymphedema pump as outlined.

## 2020-01-10 NOTE — Assessment & Plan Note (Signed)
Has had persistent issues with lower extremity swelling.  Has seen vascular surgery.  Has lymphedema pump.  Not using.  Discussed using this on a regular basis.  Has not f/u scheduled at this time with vascular surgery.  Desires not to schedule.  Wants to start using the pump regularly.  Will call or f/u if persistent problems.

## 2020-01-10 NOTE — Assessment & Plan Note (Signed)
Has seen Dr Lemar Livings previously.  S/p breat biopsy x 2.  Last mammogram 09/05/18 - recommended f/u diagnostic mammogram in one year.  Ordered.

## 2020-01-10 NOTE — Assessment & Plan Note (Signed)
On prilosec

## 2020-01-10 NOTE — Assessment & Plan Note (Signed)
Still some back issues as outlined.  Notify if desires further intervention.

## 2020-01-10 NOTE — Assessment & Plan Note (Signed)
Followed by Dr Lady Gary.  Continues on flecainide, metoprolol and aspirin.  Feels is stable.  Follow.

## 2020-01-16 ENCOUNTER — Other Ambulatory Visit: Payer: Self-pay | Admitting: Internal Medicine

## 2020-01-24 ENCOUNTER — Other Ambulatory Visit: Payer: Self-pay | Admitting: Family

## 2020-01-24 ENCOUNTER — Other Ambulatory Visit: Payer: Self-pay | Admitting: Internal Medicine

## 2020-01-25 ENCOUNTER — Ambulatory Visit (INDEPENDENT_AMBULATORY_CARE_PROVIDER_SITE_OTHER): Payer: Medicare Other | Admitting: Pharmacist

## 2020-01-25 DIAGNOSIS — E1165 Type 2 diabetes mellitus with hyperglycemia: Secondary | ICD-10-CM | POA: Diagnosis not present

## 2020-01-25 MED ORDER — OZEMPIC (1 MG/DOSE) 2 MG/1.5ML ~~LOC~~ SOPN: 1.0000 mg | PEN_INJECTOR | SUBCUTANEOUS | 1 refills | Status: DC

## 2020-01-25 MED ORDER — OZEMPIC (1 MG/DOSE) 2 MG/1.5ML ~~LOC~~ SOPN: 1.0000 mg | PEN_INJECTOR | SUBCUTANEOUS | 3 refills | Status: DC

## 2020-01-25 NOTE — Chronic Care Management (AMB) (Signed)
Chronic Care Management   Follow Up Note   01/25/2020 Name: RADHIKA DERSHEM MRN: [id number] DOB: [birth year]  Referred by: Einar Pheasant, MD Reason for referral : Chronic Care Management (Medication Management)   CRYSTALINA STODGHILL is a 64 y.o. year old female who is a primary care patient of Einar Pheasant, MD. The CCM team was consulted for assistance with chronic disease management and care coordination needs.    Contacted patient for medication management review.   Review of patient status, including review of consultants reports, relevant laboratory and other test results, and collaboration with appropriate care team members and the patient's provider was performed as part of comprehensive patient evaluation and provision of chronic care management services.    SDOH (Social Determinants of Health) assessments performed: No See Care Plan activities for detailed interventions related to [facility])     Outpatient Encounter Medications as of 01/25/2020  Medication Sig Note  . metFORMIN (GLUCOPHAGE) 1000 MG tablet TAKE ONE TABLET BY MOUTH TWICE A DAY WITH A MEAL   . rosuvastatin (CRESTOR) 10 MG tablet TAKE ONE TABLET BY MOUTH DAILY   . [DISCONTINUED] glimepiride (AMARYL) 2 MG tablet TAKE ONE TABLET BY MOUTH TWICE A DAY   . [DISCONTINUED] Semaglutide,0.25 or 0.5MG/DOS, (OZEMPIC, 0.25 OR 0.5 MG/DOSE,) 2 MG/1.5ML SOPN Inject 0.5 mg into the skin once a week. Inject 0.25 mg once weekly into the skin x4 weeks, then increase to 0.5 mg weekly   . Ascorbic Acid (VITAMIN C) 500 MG CAPS Take 500 mg by mouth daily.   Marland Kitchen aspirin 81 MG tablet Take 81 mg by mouth daily.   . beclomethasone (QVAR) 40 MCG/ACT inhaler Inhale 1 puff into the lungs 2 (two) times daily.   . cetirizine (ZYRTEC) 10 MG tablet Take 10 mg by mouth daily.   . flecainide (TAMBOCOR) 100 MG tablet TAKE 1 TABLET BY MOUTH TWICE DAILY   . hydrochlorothiazide (HYDRODIURIL) 25 MG tablet TAKE ONE TABLET BY MOUTH DAILY   . lisinopril (ZESTRIL)  20 MG tablet TAKE ONE TABLET BY MOUTH DAILY   . metoprolol tartrate (LOPRESSOR) 25 MG tablet Take 25 mg by mouth 2 (two) times daily.   . Multiple Vitamin (MULTIVITAMIN) tablet Take 1 tablet by mouth daily.   Marland Kitchen omeprazole (PRILOSEC) 20 MG capsule TAKE 1 CAPSULE BY MOUTH TWICE DAILY (Patient taking differently: TAKE 1 CAPSULE BY MOUTH  DAILY) 11/19/2019: Once daily   . oxybutynin (DITROPAN) 5 MG tablet TAKE ONE TABLET BY MOUTH TWICE A DAY   . Semaglutide, 1 MG/DOSE, (OZEMPIC, 1 MG/DOSE,) 2 MG/1.5ML SOPN Inject 1 mg into the skin once a week.   . triamcinolone cream (KENALOG) 0.1 % Apply 1 application topically 2 (two) times daily.   . [DISCONTINUED] Semaglutide, 1 MG/DOSE, (OZEMPIC, 1 MG/DOSE,) 2 MG/1.5ML SOPN Inject 1 mg into the skin once a week.    No facility-administered encounter medications on file as of 01/25/2020.     Objective:   Goals Addressed            This Visit's Progress     Patient Stated   . "I want to be healthy" (pt-stated)       CARE PLAN ENTRY (see longtitudinal plan of care for additional care plan information)  Current Barriers:  . Diabetes: uncontrolled; complicated by chronic medical conditions including hx Afib, HLD, HTN, most recent A1c 8.8% o Reports she thinks she has lost ~5-7 lbs since starting Ozempic  . Most recent eGFR: ~80 mL/min . Current antihyperglycemic regimen: metformin  1000 mg BID, glimepiride 2 mg BID, Ozempic 0.5 mg weekly o Hx- pioglitazone; unsure why this was d/c o Notes that DPP4 was prescribed but was too expensive at the time, as she was uninsured . Denies hypoglycemic symptoms, including dizziness, lightheadedness, shaking, sweating . Current exercise: none; limited by back pain . Current blood glucose readings:  o Fasting: 130-140s o Post-Supper: 130-140s . Cardiovascular risk reduction (afib, HTN) o Current hypertensive regimen: flecainide 100 mg BID, metoprolol 25 mg BID, lisinopril 20 mg, HCTZ 25 mg daily o Current  hyperlipidemia regimen: rosuvastatin 10 mg daily; last LDL at goal <70 o Current antiplatelet regimen: ASA 81 mg (CHADS2VASC 3, but has been in NSR, Dr. Ubaldo Glassing elected to continue ASA instead of DOAC) . Urinary incontinence: oxybutynin 5 mg BID  Pharmacist Clinical Goal(s):  Marland Kitchen Over the next 90 days, patient will work with PharmD and primary care provider to address optimized medication management  Interventions: . Comprehensive medication review performed, medication list updated in electronic medical record . Extensive review of plate method, carbohydrate reduction, choosing smaller portion sizes . Reviewed goal A1c, goal fasting, and goal 2 hour post prandial glucose readings.  . D/c glimepiride, increase Ozempic to 1 mg weekly. Patient has ~2 doses of 0.5 left, she will give both of those together this week then pick up new strength  Patient Self Care Activities:  . Patient will check blood glucose BID, document, and provide at future appointments . Patient will take medications as prescribed . Patient will report any questions or concerns to provider   Please see past updates related to this goal by clicking on the "Past Updates" button in the selected goal          Plan:  - Scheduled f/u call in ~ 6 weeks  Catie Darnelle Maffucci, PharmD, [place], [place] Pharmacist [place] [phone]

## 2020-01-25 NOTE — Progress Notes (Signed)
I have reviewed the above note and agree. I was available to the pharmacist for consultation.  Kamau Weatherall, MD 

## 2020-01-25 NOTE — Patient Instructions (Signed)
Visit Information  Goals Addressed            This Visit's Progress     Patient Stated   . "I want to be healthy" (pt-stated)       CARE PLAN ENTRY (see longtitudinal plan of care for additional care plan information)  Current Barriers:  . Diabetes: uncontrolled; complicated by chronic medical conditions including hx Afib, HLD, HTN, most recent A1c 8.8% o Reports she thinks she has lost ~5-7 lbs since starting Ozempic  . Most recent eGFR: ~80 mL/min . Current antihyperglycemic regimen: metformin 1000 mg BID, glimepiride 2 mg BID, Ozempic 0.5 mg weekly o Hx- pioglitazone; unsure why this was d/c o Notes that DPP4 was prescribed but was too expensive at the time, as she was uninsured . Denies hypoglycemic symptoms, including dizziness, lightheadedness, shaking, sweating . Current exercise: none; limited by back pain . Current blood glucose readings:  o Fasting: 130-140s o Post-Supper: 130-140s . Cardiovascular risk reduction (afib, HTN) o Current hypertensive regimen: flecainide 100 mg BID, metoprolol 25 mg BID, lisinopril 20 mg, HCTZ 25 mg daily o Current hyperlipidemia regimen: rosuvastatin 10 mg daily; last LDL at goal <70 o Current antiplatelet regimen: ASA 81 mg (CHADS2VASC 3, but has been in NSR, Dr. Ubaldo Glassing elected to continue ASA instead of DOAC) . Urinary incontinence: oxybutynin 5 mg BID  Pharmacist Clinical Goal(s):  Marland Kitchen Over the next 90 days, patient will work with PharmD and primary care provider to address optimized medication management  Interventions: . Comprehensive medication review performed, medication list updated in electronic medical record . Extensive review of plate method, carbohydrate reduction, choosing smaller portion sizes . Reviewed goal A1c, goal fasting, and goal 2 hour post prandial glucose readings.  . D/c glimepiride, increase Ozempic to 1 mg weekly. Patient has ~2 doses of 0.5 left, she will give both of those together this week then pick up new  strength  Patient Self Care Activities:  . Patient will check blood glucose BID, document, and provide at future appointments . Patient will take medications as prescribed . Patient will report any questions or concerns to provider   Please see past updates related to this goal by clicking on the "Past Updates" button in the selected goal         Patient verbalizes understanding of instructions provided today.      Plan:  - Scheduled f/u call in ~ 6 weeks  Catie Darnelle Maffucci, PharmD, [place], [place] Pharmacist [place] [phone]

## 2020-01-27 ENCOUNTER — Other Ambulatory Visit: Payer: Medicare Other

## 2020-01-28 ENCOUNTER — Other Ambulatory Visit (INDEPENDENT_AMBULATORY_CARE_PROVIDER_SITE_OTHER): Payer: Medicare Other

## 2020-01-28 ENCOUNTER — Other Ambulatory Visit: Payer: Self-pay

## 2020-01-28 ENCOUNTER — Other Ambulatory Visit: Payer: Medicare Other

## 2020-01-28 DIAGNOSIS — E1165 Type 2 diabetes mellitus with hyperglycemia: Secondary | ICD-10-CM | POA: Diagnosis not present

## 2020-01-28 DIAGNOSIS — E7800 Pure hypercholesterolemia, unspecified: Secondary | ICD-10-CM

## 2020-01-28 DIAGNOSIS — I10 Essential (primary) hypertension: Secondary | ICD-10-CM | POA: Diagnosis not present

## 2020-01-28 LAB — LIPID PANEL
Cholesterol: 129 mg/dL (ref 0–200)
HDL: 48.4 mg/dL (ref >39.00)
LDL Cholesterol: 53 mg/dL (ref 0–99)
NonHDL: 80.33
Total CHOL/HDL Ratio: 3
Triglycerides: 137 mg/dL (ref 0.0–149.0)
VLDL: 27.4 mg/dL (ref 0.0–40.0)

## 2020-01-28 LAB — BASIC METABOLIC PANEL
BUN: 22 mg/dL (ref 6–23)
CO2: 30 mEq/L (ref 19–32)
Calcium: 9.9 mg/dL (ref 8.4–10.5)
Chloride: 100 mEq/L (ref 96–112)
Creatinine, Ser: 0.71 mg/dL (ref 0.40–1.20)
GFR: 82.41 mL/min (ref >60.00)
Glucose, Bld: 151 mg/dL — ABNORMAL HIGH (ref 70–99)
Potassium: 4.3 mEq/L (ref 3.5–5.1)
Sodium: 137 mEq/L (ref 135–145)

## 2020-01-28 LAB — CBC WITH DIFFERENTIAL/PLATELET
Basophils Absolute: 0.1 10*3/uL (ref 0.0–0.1)
Basophils Relative: 0.6 % (ref 0.0–3.0)
Eosinophils Absolute: 0.3 10*3/uL (ref 0.0–0.7)
Eosinophils Relative: 2.6 % (ref 0.0–5.0)
HCT: 41 % (ref 36.0–46.0)
Hemoglobin: 13.4 g/dL (ref 12.0–15.0)
Lymphocytes Relative: 17.9 % (ref 12.0–46.0)
Lymphs Abs: 2.2 10*3/uL (ref 0.7–4.0)
MCHC: 32.7 g/dL (ref 30.0–36.0)
MCV: 87.5 fl (ref 78.0–100.0)
Monocytes Absolute: 0.6 10*3/uL (ref 0.1–1.0)
Monocytes Relative: 4.7 % (ref 3.0–12.0)
Neutro Abs: 9 10*3/uL — ABNORMAL HIGH (ref 1.4–7.7)
Neutrophils Relative %: 74.2 % (ref 43.0–77.0)
Platelets: 258 10*3/uL (ref 150.0–400.0)
RBC: 4.69 Mil/uL (ref 3.87–5.11)
RDW: 13.6 % (ref 11.5–15.5)
WBC: 12.1 10*3/uL — ABNORMAL HIGH (ref 4.0–10.5)

## 2020-01-28 LAB — HEPATIC FUNCTION PANEL
ALT: 24 U/L (ref 0–35)
AST: 18 U/L (ref 0–37)
Albumin: 4.2 g/dL (ref 3.5–5.2)
Alkaline Phosphatase: 59 U/L (ref 39–117)
Bilirubin, Direct: 0.1 mg/dL (ref 0.0–0.3)
Total Bilirubin: 0.3 mg/dL (ref 0.2–1.2)
Total Protein: 6.7 g/dL (ref 6.0–8.3)

## 2020-01-28 LAB — HEMOGLOBIN A1C: Hgb A1c MFr Bld: 7.8 % — ABNORMAL HIGH (ref 4.6–6.5)

## 2020-01-29 ENCOUNTER — Other Ambulatory Visit: Payer: Self-pay | Admitting: Internal Medicine

## 2020-01-29 DIAGNOSIS — D72829 Elevated white blood cell count, unspecified: Secondary | ICD-10-CM

## 2020-01-29 NOTE — Progress Notes (Signed)
Order placed for f/u cbc.   

## 2020-02-08 ENCOUNTER — Ambulatory Visit (INDEPENDENT_AMBULATORY_CARE_PROVIDER_SITE_OTHER): Payer: Medicare Other | Admitting: Adult Health

## 2020-02-08 ENCOUNTER — Encounter: Payer: Self-pay | Admitting: Adult Health

## 2020-02-08 ENCOUNTER — Other Ambulatory Visit: Payer: Self-pay

## 2020-02-08 DIAGNOSIS — G4733 Obstructive sleep apnea (adult) (pediatric): Secondary | ICD-10-CM

## 2020-02-08 NOTE — Progress Notes (Signed)
Reviewed and agree with assessment/plan.   Coralyn Helling, MD [facility] Pulmonary/Critical Care 02/08/2020, 3:37 PM Pager:  [phone]

## 2020-02-08 NOTE — Assessment & Plan Note (Signed)
Obstructive sleep apnea on nocturnal CPAP since 2007 -she is very compliant.  Her machine is getting old and she needs a a new CPAP  She does have some residual daytime sleepiness.  We will need to see if her new machine takes care of this.  If not will need further evaluation.  Plan  Patient Instructions  Order for new CPAP.- CPAP at 15cmH2O .  CPAP Download  Continue on CPAP at bedtime Work on healthy weight loss Do not drive if sleepy Follow-up in 3-4 months with Dr. Belia Heman and As needed

## 2020-02-08 NOTE — Assessment & Plan Note (Signed)
Encouraged on healthy weight loss 

## 2020-02-08 NOTE — Patient Instructions (Addendum)
Order for new CPAP.- CPAP at 15cmH2O .  CPAP Download  Continue on CPAP at bedtime Work on healthy weight loss Do not drive if sleepy Follow-up in 3-4 months with Dr. Belia Heman and As needed

## 2020-02-08 NOTE — Progress Notes (Signed)
@Patient  ID: Christine Robertson, female    DOB: Apr 02, 1956, 64 y.o.   MRN: [id number]  Chief Complaint  Patient presents with   Sleep Consult    OSA    Referring provider: Einar Pheasant, MD  HPI: 64 year old female presents for a sleep consult February 08, 2020 to establish for obstructive sleep apnea Patient was diagnosed with obstructive sleep apnea in November 2007.  Started on nocturnal CPAP Medical history significant for HTN, A Fib, DM and Hyperlipidemia .   TEST/EVENTS :  Overnight NPSG July 12, 2006 AHI 26/hour, and SaO2 low 80% optimal control on 12 cm H2O.  02/08/2020 Sleep consult Patient presents for a sleep consult to establish for obstructive sleep apnea.  Patient says she was diagnosed with sleep apnea and November 2007.  She had a sleep study that showed severe sleep apnea with AHI at 26/hour.  She was started on nocturnal CPAP.  Patient says she wears her CPAP each night.  Gets in about 8-9  hours of sleep.  Says she still has daytime sleepiness, worries her machine is not properly. [place] is 12.  Says machine is getting old , is 64 yrs old. 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  Feels somewhat rested but not as good as she has before.  Drinks 1-2 colas a day .  No teeth grinding , cataplexy, sleep paralysis , or narcolepsy.  On CPAP 15cm H20. . Dream wear full face w/ nasal pillows Originally managed by Primary MD for OSA.  Takes a nap during daytime .  Weight is slightly up from previous sleep study .    SH : Retired, married, 2 adult kids, never smoker, no etoh/drugs.   FH : father lung cancer.   Allergies  Allergen Reactions   Erythromycin Hives   Augmentin [Amoxicillin-Pot Clavulanate] Hives   Demerol [Meperidine] Nausea Only    Immunization History  Administered Date(s) Administered   Influenza Split 06/06/2012, 06/03/2014   Influenza,inj,Quad PF,6+ Mos 06/23/2013, 05/18/2015, 07/16/2016, 07/03/2017    PFIZER SARS-COV-2 Vaccination 10/16/2019, 11/11/2019   Pneumococcal Polysaccharide-23 08/31/2016   Zoster 06/01/2015    Past Medical History:  Diagnosis Date   Abnormal results of thyroid function studies    Allergy    hay fever   Angina pectoris, unspecified ([place])    Arthritis    Atrial fibrillation, unspecified    Chicken pox    Cold hands    Congenital anomaly of adrenal gland    Diabetes mellitus    type 2, uncomplicated   Essential hypertension, benign    GERD (gastroesophageal reflux disease)    IBS (irritable bowel syndrome)    Malaise    Morbid obesity (HCC)    Murmur    Pain in soft tissues of limb    Proteinuria    Pure hypercholesterolemia    Rash and other nonspecific skin eruption    Sleep apnea    Obstructive; Uses C-Pap machine   Urinary incontinence     Tobacco History: Social History   Tobacco Use  Smoking Status Never Smoker  Smokeless Tobacco Never Used   Counseling given: Not Answered   Outpatient Medications Prior to Visit  Medication Sig Dispense Refill   Ascorbic Acid (VITAMIN C) 500 MG CAPS Take 500 mg by mouth daily.     aspirin 81 MG tablet Take 81 mg by mouth daily.     beclomethasone (QVAR) 40 MCG/ACT inhaler Inhale 1 puff into the lungs 2 (  two) times daily as needed.      cetirizine (ZYRTEC) 10 MG tablet Take 10 mg by mouth daily.     flecainide (TAMBOCOR) 100 MG tablet TAKE 1 TABLET BY MOUTH TWICE DAILY     hydrochlorothiazide (HYDRODIURIL) 25 MG tablet TAKE ONE TABLET BY MOUTH DAILY 30 tablet 0   lisinopril (ZESTRIL) 20 MG tablet TAKE ONE TABLET BY MOUTH DAILY 90 tablet 0   metFORMIN (GLUCOPHAGE) 1000 MG tablet TAKE ONE TABLET BY MOUTH TWICE A DAY WITH A MEAL 180 tablet 0   metoprolol tartrate (LOPRESSOR) 25 MG tablet Take 25 mg by mouth 2 (two) times daily.     Multiple Vitamin (MULTIVITAMIN) tablet Take 1 tablet by mouth daily.     omeprazole (PRILOSEC) 20 MG capsule TAKE 1 CAPSULE BY MOUTH TWICE  DAILY (Patient taking differently: TAKE 1 CAPSULE BY MOUTH  DAILY) 60 capsule 5   oxybutynin (DITROPAN) 5 MG tablet TAKE ONE TABLET BY MOUTH TWICE A DAY 180 tablet 0   rosuvastatin (CRESTOR) 10 MG tablet TAKE ONE TABLET BY MOUTH DAILY 90 tablet 3   Semaglutide, 1 MG/DOSE, (OZEMPIC, 1 MG/DOSE,) 2 MG/1.5ML SOPN Inject 1 mg into the skin once a week. 9 mL 1   triamcinolone cream (KENALOG) 0.1 % Apply 1 application topically 2 (two) times daily. 60 g 0   No facility-administered medications prior to visit.     Review of Systems:   Constitutional:   No  weight loss, night sweats,  Fevers, chills, fatigue, or  lassitude.  HEENT:   No headaches,  Difficulty swallowing,  Tooth/dental problems, or  Sore throat,                No sneezing, itching, ear ache, nasal congestion, post nasal drip,   CV:  No chest pain,  Orthopnea, PND, swelling in lower extremities, anasarca, dizziness, palpitations, syncope.   GI  No heartburn, indigestion, abdominal pain, nausea, vomiting, diarrhea, change in bowel habits, loss of appetite, bloody stools.   Resp: No shortness of breath with exertion or at rest.  No excess mucus, no productive cough,  No non-productive cough,  No coughing up of blood.  No change in color of mucus.  No wheezing.  No chest wall deformity  Skin: no rash or lesions.  GU: no dysuria, change in color of urine, no urgency or frequency.  No flank pain, no hematuria   MS:  No joint pain or swelling.  No decreased range of motion.  No back pain.    Physical Exam  BP 134/78 (BP Location: Left Arm, Cuff Size: Large)    Pulse 74    Temp 98 F (36.7 C) (Temporal)    Ht 5' 3.5" (1.613 m)    Wt (!) 307 lb (139.3 kg)    SpO2 95% Comment: on RA   BMI 53.53 kg/m   GEN: A/Ox3; pleasant , NAD, BMI 53    HEENT:  [place]/AT,  EACs-clear, TMs-wnl, NOSE-clear, THROAT-clear, no lesions, no postnasal drip or exudate noted.   NECK:  Supple w/ fair ROM; no JVD; normal carotid impulses w/o bruits; no  thyromegaly or nodules palpated; no lymphadenopathy.    RESP  Clear  P & A; w/o, wheezes/ rales/ or rhonchi. no accessory muscle use, no dullness to percussion  CARD:  RRR, no m/r/g, no peripheral edema, pulses intact, no cyanosis or clubbing. Bilateral lymphedema in LE .   GI:   Soft & nt; nml bowel sounds; no organomegaly or masses detected.  Musco: Warm bil, no deformities or joint swelling noted.   Neuro: alert, no focal deficits noted.    Skin: Warm, no lesions or rashes, stasis dermatatic changes in LE .     Lab Results:    BNP No results found for: BNP  ProBNP No results found for: PROBNP  Imaging: No results found.    No flowsheet data found.  No results found for: NITRICOXIDE      Assessment & Plan:   Obstructive sleep apnea Obstructive sleep apnea on nocturnal CPAP since 2007 -she is very compliant.  Her machine is getting old and she needs a a new CPAP  She does have some residual daytime sleepiness.  We will need to see if her new machine takes care of this.  If not will need further evaluation.  Plan  Patient Instructions  Order for new CPAP.- CPAP at 15cmH2O .  CPAP Download  Continue on CPAP at bedtime Work on healthy weight loss Do not drive if sleepy Follow-up in 3-4 months with Dr. Belia Heman and As needed        Obesity, morbid, BMI 50 or higher (HCC) Encouraged on healthy weight loss     Rubye Oaks, NP 02/08/2020

## 2020-02-11 ENCOUNTER — Other Ambulatory Visit (INDEPENDENT_AMBULATORY_CARE_PROVIDER_SITE_OTHER): Payer: Medicare Other

## 2020-02-11 ENCOUNTER — Other Ambulatory Visit: Payer: Self-pay

## 2020-02-11 DIAGNOSIS — D72829 Elevated white blood cell count, unspecified: Secondary | ICD-10-CM

## 2020-02-11 LAB — CBC WITH DIFFERENTIAL/PLATELET
Basophils Absolute: 0.1 10*3/uL (ref 0.0–0.1)
Basophils Relative: 0.6 % (ref 0.0–3.0)
Eosinophils Absolute: 0.3 10*3/uL (ref 0.0–0.7)
Eosinophils Relative: 2.3 % (ref 0.0–5.0)
HCT: 39.4 % (ref 36.0–46.0)
Hemoglobin: 13.2 g/dL (ref 12.0–15.0)
Lymphocytes Relative: 17.2 % (ref 12.0–46.0)
Lymphs Abs: 2 10*3/uL (ref 0.7–4.0)
MCHC: 33.5 g/dL (ref 30.0–36.0)
MCV: 87 fl (ref 78.0–100.0)
Monocytes Absolute: 0.7 10*3/uL (ref 0.1–1.0)
Monocytes Relative: 5.7 % (ref 3.0–12.0)
Neutro Abs: 8.5 10*3/uL — ABNORMAL HIGH (ref 1.4–7.7)
Neutrophils Relative %: 74.2 % (ref 43.0–77.0)
Platelets: 234 10*3/uL (ref 150.0–400.0)
RBC: 4.53 Mil/uL (ref 3.87–5.11)
RDW: 13.9 % (ref 11.5–15.5)
WBC: 11.4 10*3/uL — ABNORMAL HIGH (ref 4.0–10.5)

## 2020-02-15 ENCOUNTER — Telehealth: Payer: Self-pay | Admitting: Adult Health

## 2020-02-15 DIAGNOSIS — G4733 Obstructive sleep apnea (adult) (pediatric): Secondary | ICD-10-CM

## 2020-02-15 NOTE — Telephone Encounter (Signed)
That is fine for a home sleep study

## 2020-02-15 NOTE — Telephone Encounter (Signed)
Christine Robertson, please advise if you are okay with [place] ordering a new sleep study?

## 2020-02-15 NOTE — Telephone Encounter (Signed)
Did they say why , she has prev study and is complaint

## 2020-02-15 NOTE — Telephone Encounter (Signed)
Called and spoke with pt to see why a new sleep study would need to be repeated and she stated she was told by DME due to the previous study being performed in 2007, they need to have a more updated study. Pt said that her insurance will accept a home sleep study.

## 2020-02-16 NOTE — Telephone Encounter (Signed)
LMTCB x1 for pt.  

## 2020-02-16 NOTE — Telephone Encounter (Signed)
Spoke with pt. She is aware that we will be ordering a home sleep study for her. Nothing further was needed.

## 2020-02-16 NOTE — Telephone Encounter (Signed)
Pt called back, please return call  

## 2020-02-22 ENCOUNTER — Ambulatory Visit
Admission: RE | Admit: 2020-02-22 | Discharge: 2020-02-22 | Disposition: A | Discharge status: Home / Self Care | Payer: Medicare Other | Source: Ambulatory Visit | Attending: Internal Medicine | Admitting: Internal Medicine

## 2020-02-22 DIAGNOSIS — R928 Other abnormal and inconclusive findings on diagnostic imaging of breast: Secondary | ICD-10-CM | POA: Diagnosis present

## 2020-02-25 ENCOUNTER — Other Ambulatory Visit: Payer: Self-pay | Admitting: Internal Medicine

## 2020-03-10 ENCOUNTER — Telehealth: Payer: Medicare Other

## 2020-03-10 ENCOUNTER — Ambulatory Visit: Payer: Self-pay | Admitting: Pharmacist

## 2020-03-10 NOTE — Chronic Care Management (AMB) (Signed)
°  Chronic Care Management   Note  03/10/2020 Name: RANAY KETTER MRN: [id number] DOB: 04-19-1956  CORIANNA AVALLONE is a 64 y.o. year old female who is a primary care patient of Dale [place], MD. The CCM team was consulted for assistance with chronic disease management and care coordination needs.    Attempted to contact patient for medication management review. Left HIPAA compliant message for patient to return my call at their convenience.   Plan: - Will collaborate with Care Guide to outreach to schedule follow up with me  [name], PharmD, [place], CPP Clinical Pharmacist [facility] [employer] [phone]

## 2020-03-11 ENCOUNTER — Telehealth: Payer: Self-pay | Admitting: Internal Medicine

## 2020-03-11 NOTE — Chronic Care Management (AMB) (Signed)
  Care Management   Note  03/11/2020 Name: KASSIDY DOCKENDORF MRN: [id number] DOB: Jul 07, 1956  CHANLER SCHREITER is a 64 y.o. year old female who is a primary care patient of Dale [place], MD and is actively engaged with the care management team. I reached out to [name] by phone today to assist with re-scheduling a follow up visit with the Pharmacist  Follow up plan: Unsuccessful telephone outreach attempt made. A HIPPA compliant phone message was left for the patient providing contact information and requesting a return call.  The care management team will reach out to the patient again over the next 7 days.  If patient returns call to provider office, please advise to call Embedded Care Management Care Guide Penne Lash  at [phone]  Penne Lash, RMA Care Guide, Embedded Care Coordination [facility]  [place], [place] [address] Direct Dial: [phone] [email or web address] Website: [email or web address]

## 2020-03-11 NOTE — Progress Notes (Signed)
I have reviewed the above note and agree. I was available to the pharmacist for consultation.  Jasten Guyette, MD 

## 2020-03-14 NOTE — Chronic Care Management (AMB) (Signed)
  Care Management   Note  03/14/2020 Name: Christine Robertson MRN: [id number] DOB: [birth year]  Christine Robertson is a 64 y.o. year old female who is a primary care patient of Dale [place], MD and is actively engaged with the care management team. I reached out to [name] by phone today to assist with re-scheduling a follow up visit with the Pharmacist  Follow up plan: Unsuccessful telephone outreach attempt made. A HIPPA compliant phone message was left for the patient providing contact information and requesting a return call.  The care management team will reach out to the patient again over the next 7 days.  If patient returns call to provider office, please advise to call Embedded Care Management Care Guide Penne Lash  at [phone]  Penne Lash, RMA Care Guide, Embedded Care Coordination [facility]  [place], [place] [address] Direct Dial: [phone] [email or web address]

## 2020-03-14 NOTE — Chronic Care Management (AMB) (Signed)
  Care Management   Note  03/14/2020 Name: Christine Robertson MRN: [id number] DOB: 1956/07/08  Christine Robertson is a 64 y.o. year old female who is a primary care patient of Dale [place], MD and is actively engaged with the care management team. I reached out to [name] by phone today to assist with re-scheduling a follow up visit with the Pharmacist  Follow up plan: Telephone appointment with care management team member scheduled for:04/12/2020  Penne Lash, RMA Care Guide, Embedded Care Coordination [facility]  [place], [place] [address] Direct Dial: [phone] [email or web address]: [email or web address]

## 2020-03-14 NOTE — Progress Notes (Signed)
Pt has been r/s for 04/12/2020

## 2020-03-21 ENCOUNTER — Ambulatory Visit: Payer: Medicare Other

## 2020-03-21 ENCOUNTER — Telehealth: Payer: Self-pay

## 2020-03-21 ENCOUNTER — Other Ambulatory Visit: Payer: Self-pay

## 2020-03-21 DIAGNOSIS — G4733 Obstructive sleep apnea (adult) (pediatric): Secondary | ICD-10-CM

## 2020-03-21 NOTE — Telephone Encounter (Signed)
Sleep study results; Contuine on CPAP at current settings, needs new CPAP machine - Tammy Parrett  Spoke with pt and she is completing ONO. Pt wants to wait on those results to order new machine. Pt states understanding. Nothing further needed.

## 2020-03-23 DIAGNOSIS — G4733 Obstructive sleep apnea (adult) (pediatric): Secondary | ICD-10-CM | POA: Diagnosis not present

## 2020-03-25 ENCOUNTER — Telehealth: Payer: Self-pay | Admitting: Adult Health

## 2020-03-25 DIAGNOSIS — G4733 Obstructive sleep apnea (adult) (pediatric): Secondary | ICD-10-CM

## 2020-03-25 NOTE — Telephone Encounter (Signed)
Spoke with patient. She stated that her husband has an autocpap and seems to be doing well with it. She wants to know if she could have an order to be sent for an autocpap instead of a set pressure.   TP, please advise. Thanks!

## 2020-03-25 NOTE — Telephone Encounter (Signed)
Spoke with patient. She is ok with the autocpap machine. She does not have a preference for her DME. Order has been placed. Nothing further needed.

## 2020-03-25 NOTE — Telephone Encounter (Signed)
That is fine , CPAP auto 5-20 cm

## 2020-03-25 NOTE — Telephone Encounter (Signed)
Please place new order for new CPAP machine.  Use current CPAP setting .   Home sleep study from 03/21/20 showed moderate obstructive sleep apnea with an AHI of 27.1 and SpO2 low of 78%.

## 2020-03-29 ENCOUNTER — Other Ambulatory Visit: Payer: Self-pay | Admitting: Internal Medicine

## 2020-04-12 ENCOUNTER — Ambulatory Visit (INDEPENDENT_AMBULATORY_CARE_PROVIDER_SITE_OTHER): Payer: Medicare Other | Admitting: Pharmacist

## 2020-04-12 DIAGNOSIS — E7800 Pure hypercholesterolemia, unspecified: Secondary | ICD-10-CM | POA: Diagnosis not present

## 2020-04-12 DIAGNOSIS — I10 Essential (primary) hypertension: Secondary | ICD-10-CM

## 2020-04-12 DIAGNOSIS — E1165 Type 2 diabetes mellitus with hyperglycemia: Secondary | ICD-10-CM

## 2020-04-12 DIAGNOSIS — I4891 Unspecified atrial fibrillation: Secondary | ICD-10-CM

## 2020-04-12 NOTE — Chronic Care Management (AMB) (Signed)
Chronic Care Management   Follow Up Note   04/12/2020 Name: Christine Robertson MRN: [id number] DOB: 10/10/1955  Referred by: Einar Pheasant, MD Reason for referral : Chronic Care Management (Medication Management)   Christine Robertson is a 64 y.o. year old female who is a primary care patient of Einar Pheasant, MD. The CCM team was consulted for assistance with chronic disease management and care coordination needs.    Contacted patient for medication management review.   Review of patient status, including review of consultants reports, relevant laboratory and other test results, and collaboration with appropriate care team members and the patient's provider was performed as part of comprehensive patient evaluation and provision of chronic care management services.    SDOH (Social Determinants of Health) assessments performed: Yes See Care Plan activities for detailed interventions related to SDOH)  SDOH Interventions     Most Recent Value  SDOH Interventions  Physical Activity Interventions Other (Comments)  [counseled on goals]       Outpatient Encounter Medications as of 04/12/2020  Medication Sig Note   Ascorbic Acid (VITAMIN C) 500 MG CAPS Take 500 mg by mouth daily.    aspirin 81 MG tablet Take 81 mg by mouth daily.    cetirizine (ZYRTEC) 10 MG tablet Take 10 mg by mouth daily.    flecainide (TAMBOCOR) 100 MG tablet TAKE 1 TABLET BY MOUTH TWICE DAILY    hydrochlorothiazide (HYDRODIURIL) 25 MG tablet TAKE ONE TABLET BY MOUTH DAILY    lisinopril (ZESTRIL) 20 MG tablet TAKE ONE TABLET BY MOUTH DAILY    metFORMIN (GLUCOPHAGE) 1000 MG tablet TAKE ONE TABLET BY MOUTH TWICE A DAY WITH A MEAL    metoprolol tartrate (LOPRESSOR) 25 MG tablet Take 25 mg by mouth 2 (two) times daily.    Multiple Vitamin (MULTIVITAMIN) tablet Take 1 tablet by mouth daily.    omeprazole (PRILOSEC) 20 MG capsule TAKE 1 CAPSULE BY MOUTH TWICE DAILY (Patient taking differently: TAKE 1 CAPSULE BY  MOUTH  DAILY) 11/19/2019: Once daily    oxybutynin (DITROPAN) 5 MG tablet TAKE ONE TABLET BY MOUTH TWICE A DAY    rosuvastatin (CRESTOR) 10 MG tablet TAKE ONE TABLET BY MOUTH DAILY    Semaglutide, 1 MG/DOSE, (OZEMPIC, 1 MG/DOSE,) 2 MG/1.5ML SOPN Inject 1 mg into the skin once a week.    beclomethasone (QVAR) 40 MCG/ACT inhaler Inhale 1 puff into the lungs 2 (two) times daily as needed.  (Patient not taking: Reported on 04/12/2020)    triamcinolone cream (KENALOG) 0.1 % Apply 1 application topically 2 (two) times daily. (Patient not taking: Reported on 04/12/2020)    No facility-administered encounter medications on file as of 04/12/2020.     Objective:   Goals Addressed              This Visit's Progress     Patient Stated     "I want to be healthy" (pt-stated)        CARE PLAN ENTRY (see longtitudinal plan of care for additional care plan information)  Current Barriers:   Social, financial, and community needs:  o Reports that she has "fallen off the wagon" in regards to focus on diabetes. Notes multiple family birthdays recently that have had sweets.   Diabetes: uncontrolled; complicated by chronic medical conditions including hx Afib, HLD, HTN, most recent A1c 8.8%  Most recent eGFR: ~80 mL/min  Current antihyperglycemic regimen: metformin 1000 mg BID, Ozempic 1 mg weekly o Hx- pioglitazone; unsure why this was d/c o  Over income for GLP1 assistance through [employer]  Denies hypoglycemic symptoms, including dizziness, lightheadedness, shaking, sweating  Current meal patterns: o Breakfast: Yogurt; though is often nauseous in the morning; now eating more white bread; o Lunch: will go out and get a hamburger sometimes, but doesn't usually make an effort to pack lunch o Snacks: peanut butter crackers sometimes, but not often much of a snack person o Drinks: water, diet cokes o Supper: sometimes will fix meat + vegetables, w/ potato o Sunday supper: cut out  desserts  Current exercise: none; limited by back pain  Current blood glucose readings:  o Fasting: 170s-180s o Occasional random reading: ~170-180  Cardiovascular risk reduction (afib, HTN) o Current hypertensive regimen: flecainide 100 mg BID, metoprolol 25 mg BID, lisinopril 20 mg, HCTZ 25 mg daily o Current hyperlipidemia regimen: rosuvastatin 10 mg daily; last LDL at goal <70 o Current antiplatelet regimen: ASA 81 mg (CHADS2VASC 3, but has been in NSR, Dr. Ubaldo Glassing elected to continue ASA instead of DOAC)  Urinary incontinence: oxybutynin 5 mg BID  Pharmacist Clinical Goal(s):   Over the next 90 days, patient will work with PharmD and primary care provider to address optimized medication management  Interventions:  Comprehensive medication review performed, medication list updated in electronic medical record  Inter-disciplinary care team collaboration (see longitudinal plan of care)  Reviewed goal A1c, goal fasting, and goal 2 hour post prandial glucose readings.   Given inability to qualify for patient assistance and financial concerns, prefer extensive focus on dietary intervention prior to addition of pharmacotherapy. Extensive dietary education, mailing patient Healthy Meal Planning handout.   Recommended patient resume focus of checking blood sugars BID - fasting and 2 hour post prandial, alternating between after lunch and after supper (rarely eats breakfast). She verbalized understanding. Mailing handout today to aid in documentation.   Discussed referral to nutrition. Patient declines, noting that she did see a nutritionist when she first was diagnosed, so she "knows" what she should do, just needs to focus on motivating herself to do it.  Moving forward, address if anticholinergic effects present from oxybutynin IR, can consider switch to ER  Patient Self Care Activities:   Patient will check blood glucose BID, document, and provide at future appointments  Patient  will take medications as prescribed  Patient will report any questions or concerns to provider   Please see past updates related to this goal by clicking on the "Past Updates" button in the selected goal          Plan:  - Scheduled f/u call in ~ 6 weeks  Catie Darnelle Maffucci, PharmD, [place], [place] Pharmacist [place] [phone]

## 2020-04-12 NOTE — Progress Notes (Signed)
I have reviewed the above note and agree. Can d/w her more in future regarding medication change. I was available to the pharmacist for consultation.  Dale [place], MD

## 2020-04-12 NOTE — Patient Instructions (Addendum)
Christine Robertson,   It was great talking with you today!  See the enclosed handout about healthy meal planning. Focus on reducing carbohydrate portion sizes.   Start checking your sugars twice a day - fasting, first thing in the morning and then 2 hours after the largest meal of your day. Alternate between lunch and supper. Our goal fasting sugar is <130 and goal after meals is <180 to target a hemoglobin A1c of <7%  Please call me with any questions or concerns!  Catie Darnelle Maffucci, PharmD [phone]  Visit Information  Goals Addressed              This Visit's Progress     Patient Stated   .  "I want to be healthy" (pt-stated)        CARE PLAN ENTRY (see longtitudinal plan of care for additional care plan information)  Current Barriers:  . Social, financial, and community needs:  o Reports that she has "fallen off the wagon" in regards to focus on diabetes. Notes multiple family birthdays recently that have had sweets.  . Diabetes: uncontrolled; complicated by chronic medical conditions including hx Afib, HLD, HTN, most recent A1c 8.8% . Most recent eGFR: ~80 mL/min . Current antihyperglycemic regimen: metformin 1000 mg BID, Ozempic 1 mg weekly o Hx- pioglitazone; unsure why this was d/c o Over income for GLP1 assistance through [employer] . Denies hypoglycemic symptoms, including dizziness, lightheadedness, shaking, sweating . Current meal patterns: o Breakfast: Yogurt; though is often nauseous in the morning; now eating more white bread; o Lunch: will go out and get a hamburger sometimes, but doesn't usually make an effort to pack lunch o Snacks: peanut butter crackers sometimes, but not often much of a snack person o Drinks: water, diet cokes o Supper: sometimes will fix meat + vegetables, w/ potato o Sunday supper: cut out desserts . Current exercise: none; limited by back pain . Current blood glucose readings:  o Fasting: 170s-180s o Occasional random reading:  ~170-180 . Cardiovascular risk reduction (afib, HTN) o Current hypertensive regimen: flecainide 100 mg BID, metoprolol 25 mg BID, lisinopril 20 mg, HCTZ 25 mg daily o Current hyperlipidemia regimen: rosuvastatin 10 mg daily; last LDL at goal <70 o Current antiplatelet regimen: ASA 81 mg (CHADS2VASC 3, but has been in NSR, Dr. Ubaldo Glassing elected to continue ASA instead of DOAC) . Urinary incontinence: oxybutynin 5 mg BID  Pharmacist Clinical Goal(s):  [place] Kitchen Over the next 90 days, patient will work with PharmD and primary care provider to address optimized medication management  Interventions: . Comprehensive medication review performed, medication list updated in electronic medical record . Inter-disciplinary care team collaboration (see longitudinal plan of care) . Reviewed goal A1c, goal fasting, and goal 2 hour post prandial glucose readings.  . Given inability to qualify for patient assistance and financial concerns, prefer extensive focus on dietary intervention prior to addition of pharmacotherapy. Extensive dietary education, mailing patient Healthy Meal Planning handout.  . Recommended patient resume focus of checking blood sugars BID - fasting and 2 hour post prandial, alternating between after lunch and after supper (rarely eats breakfast). She verbalized understanding. Mailing handout today to aid in documentation.  . Discussed referral to nutrition. Patient declines, noting that she did see a nutritionist when she first was diagnosed, so she "knows" what she should do, just needs to focus on motivating herself to do it. . Moving forward, address if anticholinergic effects present from oxybutynin IR, can consider switch to ER  Patient Self Care Activities:  .  Patient will check blood glucose BID, document, and provide at future appointments . Patient will take medications as prescribed . Patient will report any questions or concerns to provider   Please see past updates related to this goal  by clicking on the "Past Updates" button in the selected goal         The patient verbalized understanding of instructions provided today and agreed to receive a mailed copy of patient instruction and/or educational materials.  Plan:  - Scheduled f/u call in ~ 6 weeks  Catie Darnelle Maffucci, PharmD, [place], [place] Pharmacist [place] [phone]

## 2020-04-19 ENCOUNTER — Other Ambulatory Visit: Payer: Self-pay | Admitting: Family

## 2020-04-19 ENCOUNTER — Other Ambulatory Visit: Payer: Self-pay | Admitting: Internal Medicine

## 2020-05-01 ENCOUNTER — Other Ambulatory Visit: Payer: Self-pay | Admitting: Internal Medicine

## 2020-05-10 ENCOUNTER — Ambulatory Visit
Admission: RE | Admit: 2020-05-10 | Discharge: 2020-05-10 | Disposition: A | Discharge status: Home / Self Care | Payer: Medicare Other | Source: Ambulatory Visit | Attending: Internal Medicine | Admitting: Internal Medicine

## 2020-05-10 ENCOUNTER — Ambulatory Visit: Payer: Medicare Other

## 2020-05-10 ENCOUNTER — Encounter: Payer: Self-pay | Admitting: Internal Medicine

## 2020-05-10 ENCOUNTER — Ambulatory Visit (INDEPENDENT_AMBULATORY_CARE_PROVIDER_SITE_OTHER): Payer: Medicare Other | Admitting: Internal Medicine

## 2020-05-10 ENCOUNTER — Other Ambulatory Visit: Payer: Self-pay

## 2020-05-10 VITALS — BP 136/86 | HR 89 | Temp 98.8°F | Resp 16 | Ht 63.5 in | Wt 299.1 lb

## 2020-05-10 DIAGNOSIS — E7800 Pure hypercholesterolemia, unspecified: Secondary | ICD-10-CM

## 2020-05-10 DIAGNOSIS — K219 Gastro-esophageal reflux disease without esophagitis: Secondary | ICD-10-CM

## 2020-05-10 DIAGNOSIS — M545 Low back pain, unspecified: Secondary | ICD-10-CM | POA: Insufficient documentation

## 2020-05-10 DIAGNOSIS — I4891 Unspecified atrial fibrillation: Secondary | ICD-10-CM

## 2020-05-10 DIAGNOSIS — E1165 Type 2 diabetes mellitus with hyperglycemia: Secondary | ICD-10-CM

## 2020-05-10 DIAGNOSIS — R109 Unspecified abdominal pain: Secondary | ICD-10-CM | POA: Insufficient documentation

## 2020-05-10 DIAGNOSIS — Q891 Congenital malformations of adrenal gland: Secondary | ICD-10-CM

## 2020-05-10 DIAGNOSIS — G4733 Obstructive sleep apnea (adult) (pediatric): Secondary | ICD-10-CM

## 2020-05-10 DIAGNOSIS — Z23 Encounter for immunization: Secondary | ICD-10-CM

## 2020-05-10 DIAGNOSIS — I10 Essential (primary) hypertension: Secondary | ICD-10-CM | POA: Diagnosis not present

## 2020-05-10 NOTE — Progress Notes (Signed)
Patient ID: Christine Robertson, female   DOB: 1956/05/07, 64 y.o.   MRN: [id number]   Subjective:    Patient ID: Christine Robertson, female    DOB: 1955/10/30, 63 y.o.   MRN: [id number]  HPI This visit occurred during the SARS-CoV-2 public health emergency.  Safety protocols were in place, including screening questions prior to the visit, additional usage of staff PPE, and extensive cleaning of exam room while observing appropriate contact time as indicated for disinfecting solutions.  Patient here for a scheduled follow up.  She reports she has been having increased pain right low side.  Present for 2 months.  Located right side and right abdomen.  Previous diarrhea.  Now with constipation.  Notices some pain when eating.  Also having low back pain. Has to lean over the counter to help support her back, after standing for 5 minutes.  Support helps.  No chest pain.  Followed by cardiology for PAF.  On flecainide, metoprolol and aspirin.  Not watching her diet.  Not exercising.  AM sugars 135-175.  No increased cough or congestion.  No acid reflux reported.  No vomiting.     Past Medical History:  Diagnosis Date  . Abnormal results of thyroid function studies   . Allergy    hay fever  . Angina pectoris, unspecified ([place])   . Arthritis   . Atrial fibrillation, unspecified   . Chicken pox   . Cold hands   . Congenital anomaly of adrenal gland   . Diabetes mellitus    type 2, uncomplicated  . Essential hypertension, benign   . GERD (gastroesophageal reflux disease)   . IBS (irritable bowel syndrome)   . Malaise   . Morbid obesity ([place])   . Murmur   . Pain in soft tissues of limb   . Proteinuria   . Pure hypercholesterolemia   . Rash and other nonspecific skin eruption   . Sleep apnea    Obstructive; Uses C-Pap machine  . Urinary incontinence    Past Surgical History:  Procedure Laterality Date  . ABDOMINAL HYSTERECTOMY  2010  . BLADDER SURGERY  1967   stretching of bladder  . BREAST  BIOPSY Left 03/28/2015   done at Dr. Dwyane Luo office. fibrocystic changes  . COLONOSCOPY    . COLONOSCOPY WITH PROPOFOL N/A 09/09/2015   Procedure: COLONOSCOPY WITH PROPOFOL;  Surgeon: Manya Silvas, MD;  Location: [facility] ENDOSCOPY;  Service: Endoscopy;  Laterality: N/A;  . ESOPHAGOGASTRODUODENOSCOPY    . TONSILLECTOMY AND ADENOIDECTOMY  1959  . URETHRAL DILATION     Family History  Problem Relation Age of Onset  . Diabetes Mother   . Cancer Father        lung  . Breast cancer Maternal Grandmother 60  . Breast cancer Maternal Aunt 65   Social History   Socioeconomic History  . Marital status: Married    Spouse name: Not on file  . Number of children: Not on file  . Years of education: Not on file  . Highest education level: Not on file  Occupational History  . Not on file  Tobacco Use  . Smoking status: Never Smoker  . Smokeless tobacco: Never Used  Substance and Sexual Activity  . Alcohol use: No    Alcohol/week: 0.0 standard drinks  . Drug use: No  . Sexual activity: Not on file  Other Topics Concern  . Not on file  Social History Narrative  . Not on file   Social Determinants of  Health   Financial Resource Strain:   . Difficulty of Paying Living Expenses: Not on file  Food Insecurity:   . Worried About Charity fundraiser in the Last Year: Not on file  . Ran Out of Food in the Last Year: Not on file  Transportation Needs:   . Lack of Transportation (Medical): Not on file  . Lack of Transportation (Non-Medical): Not on file  Physical Activity: Unknown  . Days of Exercise per Week: Not on file  . Minutes of Exercise per Session: 0 min  Stress:   . Feeling of Stress : Not on file  Social Connections:   . Frequency of Communication with Friends and Family: Not on file  . Frequency of Social Gatherings with Friends and Family: Not on file  . Attends Religious Services: Not on file  . Active Member of Clubs or Organizations: Not on file  . Attends Theatre manager Meetings: Not on file  . Marital Status: Not on file    Outpatient Encounter Medications as of 05/10/2020  Medication Sig  . Ascorbic Acid (VITAMIN C) 500 MG CAPS Take 500 mg by mouth daily.  Marland Kitchen aspirin 81 MG tablet Take 81 mg by mouth daily.  . beclomethasone (QVAR) 40 MCG/ACT inhaler Inhale 1 puff into the lungs 2 (two) times daily as needed.   . cetirizine (ZYRTEC) 10 MG tablet Take 10 mg by mouth daily.  . flecainide (TAMBOCOR) 100 MG tablet TAKE 1 TABLET BY MOUTH TWICE DAILY  . hydrochlorothiazide (HYDRODIURIL) 25 MG tablet TAKE ONE TABLET BY MOUTH DAILY  . lisinopril (ZESTRIL) 20 MG tablet TAKE ONE TABLET BY MOUTH DAILY  . metFORMIN (GLUCOPHAGE) 1000 MG tablet TAKE ONE TABLET BY MOUTH TWICE A DAY WITH MEALS  . metoprolol tartrate (LOPRESSOR) 25 MG tablet Take 25 mg by mouth 2 (two) times daily.  . Multiple Vitamin (MULTIVITAMIN) tablet Take 1 tablet by mouth daily.  Marland Kitchen omeprazole (PRILOSEC) 20 MG capsule TAKE 1 CAPSULE BY MOUTH TWICE DAILY (Patient taking differently: TAKE 1 CAPSULE BY MOUTH  DAILY)  . oxybutynin (DITROPAN) 5 MG tablet TAKE ONE TABLET BY MOUTH TWICE A DAY  . rosuvastatin (CRESTOR) 10 MG tablet TAKE ONE TABLET BY MOUTH DAILY  . Semaglutide, 1 MG/DOSE, (OZEMPIC, 1 MG/DOSE,) 2 MG/1.5ML SOPN Inject 1 mg into the skin once a week.  . triamcinolone cream (KENALOG) 0.1 % Apply 1 application topically 2 (two) times daily.   No facility-administered encounter medications on file as of 05/10/2020.    Review of Systems  Constitutional: Negative for appetite change and unexpected weight change.  HENT: Negative for congestion and sinus pressure.   Respiratory: Negative for cough, chest tightness and shortness of breath.   Cardiovascular: Negative for chest pain and palpitations.       No increased swelling.   Gastrointestinal: Positive for abdominal pain. Negative for nausea and vomiting.       Right side and abdominal pain. Previous diarrhea.  Now some constipation.    Genitourinary: Negative for difficulty urinating and dysuria.  Musculoskeletal: Positive for back pain. Negative for joint swelling and myalgias.  Skin: Negative for color change and rash.  Neurological: Negative for dizziness, light-headedness and headaches.  Psychiatric/Behavioral: Negative for agitation and dysphoric mood.       Objective:    Physical Exam Vitals reviewed.  Constitutional:      General: She is not in acute distress.    Appearance: Normal appearance.  HENT:     Head: Normocephalic and  atraumatic.     Right Ear: External ear normal.     Left Ear: External ear normal.  Eyes:     General: No scleral icterus.       Right eye: No discharge.        Left eye: No discharge.     Conjunctiva/sclera: Conjunctivae normal.  Neck:     Thyroid: No thyromegaly.  Cardiovascular:     Rate and Rhythm: Normal rate and regular rhythm.  Pulmonary:     Effort: No respiratory distress.     Breath sounds: Normal breath sounds. No wheezing.  Abdominal:     General: Bowel sounds are normal.     Palpations: Abdomen is soft.     Tenderness: There is no abdominal tenderness.  Musculoskeletal:        General: No swelling or tenderness.     Cervical back: Neck supple. No tenderness.  Lymphadenopathy:     Cervical: No cervical adenopathy.  Skin:    Findings: No erythema or rash.  Neurological:     Mental Status: She is alert.  Psychiatric:        Mood and Affect: Mood normal.        Behavior: Behavior normal.     BP 136/86 (BP Location: Left Arm, Patient Position: Sitting, Cuff Size: Large) Comment (Cuff Size): thigh cuff  Pulse 89   Temp 98.8 F (37.1 C) (Oral)   Resp 16   Ht 5' 3.5" (1.613 m)   Wt 299 lb 1.9 oz (135.7 kg)   SpO2 94%   BMI 52.16 kg/m  Wt Readings from Last 3 Encounters:  05/10/20 299 lb 1.9 oz (135.7 kg)  02/08/20 (!) 307 lb (139.3 kg)  01/08/20 (!) 306 lb 3.2 oz (138.9 kg)     Lab Results  Component Value Date   WBC 9.8 05/10/2020   HGB  13.2 05/10/2020   HCT 38.9 05/10/2020   PLT 257.0 05/10/2020   GLUCOSE 176 (H) 05/10/2020   CHOL 126 05/10/2020   TRIG 200.0 (H) 05/10/2020   HDL 51.20 05/10/2020   LDLDIRECT 101.0 06/05/2018   LDLCALC 35 05/10/2020   ALT 30 05/10/2020   AST 27 05/10/2020   NA 137 05/10/2020   K 4.0 05/10/2020   CL 99 05/10/2020   CREATININE 0.68 05/10/2020   BUN 15 05/10/2020   CO2 29 05/10/2020   TSH 0.87 10/13/2019   INR 0.99 11/15/2012   HGBA1C 8.9 (H) 05/10/2020   MICROALBUR 1.7 03/12/2019    MM DIAG BREAST TOMO BILATERAL  Result Date: 02/22/2020 CLINICAL DATA:  Short-term follow-up for left breast calcifications, initially assessed in September 2018. EXAM: DIGITAL DIAGNOSTIC BILATERAL MAMMOGRAM WITH TOMO AND CAD COMPARISON:  Previous exam(s). ACR Breast Density Category b: There are scattered areas of fibroglandular density. FINDINGS: The small group of calcifications in the left breast are unchanged. There are no suspicious masses, no areas of significant asymmetry, no architectural distortion and no new or suspicious calcifications. Mammographic images were processed with CAD. IMPRESSION: 1. No evidence of breast malignancy. 2. Benign left breast calcifications. RECOMMENDATION: Screening mammogram in one year.(Code:SM-B-01Y) I have discussed the findings and recommendations with the patient. If applicable, a reminder letter will be sent to the patient regarding the next appointment. BI-RADS CATEGORY  2: Benign. Electronically Signed   By: Lajean Manes M.D.   On: 02/22/2020 11:34       Assessment & Plan:   Problem List Items Addressed This Visit    Right sided abdominal pain  Persistent right side abdominal pain as outlined.  Check KUB.  Routine labs.  May need CT scan.       Relevant Orders   Urinalysis, Routine w reflex microscopic (Completed)   DG Abd 2 Views (Completed)   Obstructive sleep apnea    Continue cpap.        Obesity, morbid, BMI 50 or higher ([place])    Discussed  diet and exercise.        Low back pain    Low back pain as outlined.  Check L-S spine xray.  May need PT.       Relevant Orders   DG Lumbar Spine 2-3 Views (Completed)   Hypertension - Primary    Blood pressure as outlined.  Continue metoprolol, hctz and lisinopril.  Follow pressures.  Follow metabolic panel.       Relevant Orders   CBC with Differential/Platelet (Completed)   Basic metabolic panel (Completed)   Hypercholesteremia    On crestor. Low cholesterol diet and exercise.  Follow lipid panel and liver function tests.        Relevant Orders   Hepatic function panel (Completed)   Lipid panel (Completed)   GERD (gastroesophageal reflux disease)    On prilosec.       Diabetes mellitus ([place])    Low carb diet and exercise.  On ozempic.  Sugars as outlined.  Check met b and a1c.  Not watching her diet.  Not exercising.        Relevant Orders   Hemoglobin A1c (Completed)   Atrial fibrillation ([facility])    Followed by Dr Ubaldo Glassing.  Stable.  On flecainide, metoprolol and aspirin.        Adrenal gland anomaly    Last MRI stable.         Other Visit Diagnoses    Need for immunization against influenza       Relevant Orders   Flu Vaccine QUAD High Dose(Fluad) (Completed)       Einar Pheasant, MD

## 2020-05-11 LAB — BASIC METABOLIC PANEL
BUN: 15 mg/dL (ref 6–23)
CO2: 29 mEq/L (ref 19–32)
Calcium: 9.4 mg/dL (ref 8.4–10.5)
Chloride: 99 mEq/L (ref 96–112)
Creatinine, Ser: 0.68 mg/dL (ref 0.40–1.20)
GFR: 86.54 mL/min (ref >60.00)
Glucose, Bld: 176 mg/dL — ABNORMAL HIGH (ref 70–99)
Potassium: 4 mEq/L (ref 3.5–5.1)
Sodium: 137 mEq/L (ref 135–145)

## 2020-05-11 LAB — CBC WITH DIFFERENTIAL/PLATELET
Basophils Absolute: 0.1 10*3/uL (ref 0.0–0.1)
Basophils Relative: 0.7 % (ref 0.0–3.0)
Eosinophils Absolute: 0.3 10*3/uL (ref 0.0–0.7)
Eosinophils Relative: 2.7 % (ref 0.0–5.0)
HCT: 38.9 % (ref 36.0–46.0)
Hemoglobin: 13.2 g/dL (ref 12.0–15.0)
Lymphocytes Relative: 21.3 % (ref 12.0–46.0)
Lymphs Abs: 2.1 10*3/uL (ref 0.7–4.0)
MCHC: 34 g/dL (ref 30.0–36.0)
MCV: 87.9 fl (ref 78.0–100.0)
Monocytes Absolute: 0.5 10*3/uL (ref 0.1–1.0)
Monocytes Relative: 5.4 % (ref 3.0–12.0)
Neutro Abs: 6.9 10*3/uL (ref 1.4–7.7)
Neutrophils Relative %: 69.9 % (ref 43.0–77.0)
Platelets: 257 10*3/uL (ref 150.0–400.0)
RBC: 4.42 Mil/uL (ref 3.87–5.11)
RDW: 13 % (ref 11.5–15.5)
WBC: 9.8 10*3/uL (ref 4.0–10.5)

## 2020-05-11 LAB — LIPID PANEL
Cholesterol: 126 mg/dL (ref 0–200)
HDL: 51.2 mg/dL (ref >39.00)
LDL Cholesterol: 35 mg/dL (ref 0–99)
NonHDL: 74.61
Total CHOL/HDL Ratio: 2
Triglycerides: 200 mg/dL — ABNORMAL HIGH (ref 0.0–149.0)
VLDL: 40 mg/dL (ref 0.0–40.0)

## 2020-05-11 LAB — URINALYSIS, ROUTINE W REFLEX MICROSCOPIC
Bilirubin Urine: NEGATIVE
Hgb urine dipstick: NEGATIVE
Ketones, ur: NEGATIVE
Leukocytes,Ua: NEGATIVE
Nitrite: NEGATIVE
Specific Gravity, Urine: 1.01 (ref 1.000–1.030)
Total Protein, Urine: NEGATIVE
Urine Glucose: NEGATIVE
Urobilinogen, UA: 0.2 (ref 0.0–1.0)
pH: 5.5 (ref 5.0–8.0)

## 2020-05-11 LAB — HEPATIC FUNCTION PANEL
ALT: 30 U/L (ref 0–35)
AST: 27 U/L (ref 0–37)
Albumin: 4.2 g/dL (ref 3.5–5.2)
Alkaline Phosphatase: 51 U/L (ref 39–117)
Bilirubin, Direct: 0.1 mg/dL (ref 0.0–0.3)
Total Bilirubin: 0.4 mg/dL (ref 0.2–1.2)
Total Protein: 6.9 g/dL (ref 6.0–8.3)

## 2020-05-11 LAB — HEMOGLOBIN A1C: Hgb A1c MFr Bld: 8.9 % — ABNORMAL HIGH (ref 4.6–6.5)

## 2020-05-14 ENCOUNTER — Telehealth: Payer: Self-pay | Admitting: Internal Medicine

## 2020-05-14 ENCOUNTER — Other Ambulatory Visit: Payer: Self-pay | Admitting: Internal Medicine

## 2020-05-14 DIAGNOSIS — R1011 Right upper quadrant pain: Secondary | ICD-10-CM

## 2020-05-14 DIAGNOSIS — M545 Low back pain, unspecified: Secondary | ICD-10-CM

## 2020-05-14 NOTE — Progress Notes (Signed)
Order placed for CT abdomen and pelvis and for PT referral.

## 2020-05-14 NOTE — Telephone Encounter (Signed)
I have placed an order for referral to PT and order for CT scan abdomen and pelvis.  She is having increased right side pain.  Xray abdomen negative.  Has a history of adrenal lesion.  Needs CT scan abdomen and pelvis.  Will need to hold metformin 24 hours prior to scan and 48 hours after scan.

## 2020-05-15 ENCOUNTER — Encounter: Payer: Self-pay | Admitting: Internal Medicine

## 2020-05-15 NOTE — Assessment & Plan Note (Signed)
Blood pressure as outlined.  Continue metoprolol, hctz and lisinopril.  Follow pressures.  Follow metabolic panel.

## 2020-05-15 NOTE — Assessment & Plan Note (Signed)
Persistent right side abdominal pain as outlined.  Check KUB.  Routine labs.  May need CT scan.

## 2020-05-15 NOTE — Assessment & Plan Note (Signed)
Discussed diet and exercise 

## 2020-05-15 NOTE — Assessment & Plan Note (Signed)
Followed by Dr Lady Gary.  Stable.  On flecainide, metoprolol and aspirin.

## 2020-05-15 NOTE — Assessment & Plan Note (Signed)
Last MRI stable.

## 2020-05-15 NOTE — Assessment & Plan Note (Signed)
On crestor.  Low cholesterol diet and exercise.  Follow lipid panel and liver function tests.   

## 2020-05-15 NOTE — Assessment & Plan Note (Signed)
Low back pain as outlined.  Check L-S spine xray.  May need PT.

## 2020-05-15 NOTE — Assessment & Plan Note (Signed)
Low carb diet and exercise.  On ozempic.  Sugars as outlined.  Check met b and a1c.  Not watching her diet.  Not exercising.

## 2020-05-15 NOTE — Assessment & Plan Note (Signed)
Continue cpap.  

## 2020-05-15 NOTE — Assessment & Plan Note (Signed)
On prilosec

## 2020-05-16 NOTE — Telephone Encounter (Signed)
Good morning!  Noted

## 2020-05-16 NOTE — Telephone Encounter (Signed)
Pt is scheduled for CT scan 05/30/20.  Please make sure she is aware to hold her metformin 24 hours before the CT and 48 hours after CT.  She will need creatinine checked 24 hours after - before starting back on metformin.   Let me know if any questions.  Will need non fasting lab scheduled.

## 2020-05-25 ENCOUNTER — Ambulatory Visit (INDEPENDENT_AMBULATORY_CARE_PROVIDER_SITE_OTHER): Payer: Medicare Other | Admitting: Pharmacist

## 2020-05-25 DIAGNOSIS — E1165 Type 2 diabetes mellitus with hyperglycemia: Secondary | ICD-10-CM | POA: Diagnosis not present

## 2020-05-25 DIAGNOSIS — I10 Essential (primary) hypertension: Secondary | ICD-10-CM

## 2020-05-25 DIAGNOSIS — E7800 Pure hypercholesterolemia, unspecified: Secondary | ICD-10-CM | POA: Diagnosis not present

## 2020-05-25 NOTE — Chronic Care Management (AMB) (Signed)
Chronic Care Management   Follow Up Note   05/25/2020 Name: Christine Robertson MRN: [id number] DOB: 26-May-1956  Referred by: Dale [place], MD Reason for referral : Chronic Care Management (Medication Management)   Christine Robertson is a 64 y.o. year old female who is a primary care patient of Dale [place], MD. The CCM team was consulted for assistance with chronic disease management and care coordination needs.    Contacted patient for medication management f/u.  Review of patient status, including review of consultants reports, relevant laboratory and other test results, and collaboration with appropriate care team members and the patient's provider was performed as part of comprehensive patient evaluation and provision of chronic care management services.    SDOH (Social Determinants of Health) assessments performed: Yes See Care Plan activities for detailed interventions related to SDOH)  SDOH Interventions     Most Recent Value  SDOH Interventions  Financial Strain Interventions Other (Comment)  [over income for medication assistance]       Outpatient Encounter Medications as of 05/25/2020  Medication Sig Note  . Ascorbic Acid (VITAMIN C) 500 MG CAPS Take 500 mg by mouth daily.   Marland Kitchen aspirin 81 MG tablet Take 81 mg by mouth daily.   . beclomethasone (QVAR) 40 MCG/ACT inhaler Inhale 1 puff into the lungs 2 (two) times daily as needed.    . cetirizine (ZYRTEC) 10 MG tablet Take 10 mg by mouth daily.   . flecainide (TAMBOCOR) 100 MG tablet TAKE 1 TABLET BY MOUTH TWICE DAILY   . hydrochlorothiazide (HYDRODIURIL) 25 MG tablet TAKE ONE TABLET BY MOUTH DAILY   . lisinopril (ZESTRIL) 20 MG tablet TAKE ONE TABLET BY MOUTH DAILY   . metFORMIN (GLUCOPHAGE) 1000 MG tablet TAKE ONE TABLET BY MOUTH TWICE A DAY WITH MEALS   . metoprolol tartrate (LOPRESSOR) 25 MG tablet Take 25 mg by mouth 2 (two) times daily.   . Multiple Vitamin (MULTIVITAMIN) tablet Take 1 tablet by mouth daily.   Marland Kitchen  omeprazole (PRILOSEC) 20 MG capsule TAKE 1 CAPSULE BY MOUTH TWICE DAILY (Patient taking differently: TAKE 1 CAPSULE BY MOUTH  DAILY) 11/19/2019: Once daily   . oxybutynin (DITROPAN) 5 MG tablet TAKE ONE TABLET BY MOUTH TWICE A DAY   . rosuvastatin (CRESTOR) 10 MG tablet TAKE ONE TABLET BY MOUTH DAILY   . Semaglutide, 1 MG/DOSE, (OZEMPIC, 1 MG/DOSE,) 2 MG/1.5ML SOPN Inject 1 mg into the skin once a week.   . triamcinolone cream (KENALOG) 0.1 % Apply 1 application topically 2 (two) times daily.    No facility-administered encounter medications on file as of 05/25/2020.     Objective:   Goals Addressed              This Visit's Progress     Patient Stated   .  "I want to be healthy" (pt-stated)        CARE PLAN ENTRY (see longtitudinal plan of care for additional care plan information)  Current Barriers:  . Social, financial, and community needs:  o Notes that she is in the [employer] Coverage Gap. Ozempic >$200. Patient is over income for assistance through [employer] . Diabetes: uncontrolled; complicated by chronic medical conditions including hx Afib, HLD, HTN, most recent A1c 8.9% . Most recent eGFR: ~86 mL/min . Current antihyperglycemic regimen: metformin 1000 mg BID, Ozempic 1 mg weekly o Hx- pioglitazone; unsure why this was d/c o Over income for GLP1 assistance through [employer] . Current meal patterns: o Breakfast: Bacon, egg,  and cheese biscuit if she goes out in the morning, usually once per week; sometimes an egg, sometimes oatmeal; diet coke or water; does note that she bought [employer] Protein shakes but has not incorporated into her routine yet o Lunch: [place] sandwich on whole wheat bread; sometimes leftovers from supper o Supper: Frozen breaded oven baked shrimp; peas; carrots w/ honey; sometimes will get take out;  o Self reported problem: gets hungry again before bed, so will eat seconds fo supper . Current exercise: none; limited by back pain . Current blood  glucose readings:  o Fasting: 139-179 o After lunch: ~140-180s; highest 185 . Cardiovascular risk reduction (afib, HTN; follows w/ Dr. Ubaldo Glassing) o Current hypertensive regimen: flecainide 100 mg BID, metoprolol tartrate 25 mg BID, lisinopril 20 mg, HCTZ 25 mg daily; BP at goal at recent office visits. o Current hyperlipidemia regimen: rosuvastatin 10 mg daily; last LDL at goal <70 o Current antiplatelet regimen: ASA 81 mg (CHADS2VASC 3, but has been in NSR, Dr. Ubaldo Glassing elected to continue ASA instead of DOAC) . Urinary incontinence: oxybutynin 5 mg BID . Back pain/R side pain: currently being worked up by Dr. Nicki Reaper. Nothing found on x ray. Upcoming CT scan.  Pharmacist Clinical Goal(s):  Marland Kitchen Over the next 90 days, patient will work with PharmD and primary care provider to address optimized medication management  Interventions: . Comprehensive medication review performed, medication list updated in electronic medical record . Inter-disciplinary care team collaboration (see longitudinal plan of care) . Reviewed goal A1c, goal fasting, and goal 2 hour post prandial.  . Discussed next steps. Dicussed addition of SGLT2, though cost-prohibitive at this time as patient in the Medicare Coverage Gap and over income for assistance. Discussed addition of glipizide w/ supper, though risk of hypoglycemia and weight gain. Discussed use of CGM to evaluate dietary choices. Medicare would not cover as patient is not on insulin, but she could pay cash price of $75/month for [employer]. Also suggested a 2 week sample CGM. Patient suggested keeping a food diary, and elects to do this first for 4 weeks. I encouraged her to continue to check fasting and post-lunch, but add post-supper glucose checks to evaluate impact of dinner choices that she makes . Discussed suggestions for better post-supper snacks than leftovers from supper. Encouraged high protein, low carbohydrate snacks.  Patient Self Care Activities:  . Patient  will check blood glucose BID-TID, document, and provide at future appointments . Patient will take medications as prescribed . Patient will report any questions or concerns to provider   Please see past updates related to this goal by clicking on the "Past Updates" button in the selected goal          Plan:  - Scheduled f/u call in ~ 4 weeks  Catie Darnelle Maffucci, PharmD, [place], [place] Pharmacist [place] Orestes [phone]

## 2020-05-25 NOTE — Patient Instructions (Signed)
Ms. Toothaker,   It was great talking with you today!  I think keeping a food diary for the next 4 weeks is a GREAT idea. I've included some sample logs where you could keep track of what you are eating AND how your blood sugars are running. I think correlating these pieces of information will help you identify patterns.   We talked about a couple of next steps:  1) We have samples of the FreeStyle Libre 2 Continuous Glucose Monitor. You could wear one for 2 weeks for free. This meter checks your sugar every 15 minutes. We could then review your blood sugar trends, compare to what you are eating, and help make adjustments.  2) For now, Medicare only covers Continuous Glucose Monitors if you are on insulin. If you decided you wanted to pay out of pocket for the Continuous Glucose Monitor, the cash prices for [employer] 2 is $75 3) The ideal next medication choice would be something like [place] or [place]. These work by causing you to pee more glucose. However, they are brand name medications, so would likely be as expensive as the Ozempic now that you are in the Coverage Gap 4) A cheaper alternative would be glipizide (a shorter acting cousin medication to glimepiride). This medication is cheap, but unfortunately has side effects of increasing the risk of low blood sugars or weight gain.   I look forward to talking to you next month! Call me if you have any questions or concerns in the meantime.   Catie Darnelle Maffucci, PharmD [phone]  Visit Information  Goals Addressed              This Visit's Progress     Patient Stated     "I want to be healthy" (pt-stated)        CARE PLAN ENTRY (see longtitudinal plan of care for additional care plan information)  Current Barriers:   Social, financial, and community needs:  o Notes that she is in the [employer] Coverage Gap. Ozempic >$200. Patient is over income for assistance through [employer]  Diabetes: uncontrolled; complicated by  chronic medical conditions including hx Afib, HLD, HTN, most recent A1c 8.9%  Most recent eGFR: ~86 mL/min  Current antihyperglycemic regimen: metformin 1000 mg BID, Ozempic 1 mg weekly o Hx- pioglitazone; unsure why this was d/c o Over income for GLP1 assistance through [employer]  Current meal patterns: o Breakfast: Bacon, egg, and cheese biscuit if she goes out in the morning, usually once per week; sometimes an egg, sometimes oatmeal; diet coke or water; does note that she bought [employer] Protein shakes but has not incorporated into her routine yet o Lunch: [place] sandwich on whole wheat bread; sometimes leftovers from supper o Supper: Frozen breaded oven baked shrimp; peas; carrots w/ honey; sometimes will get take out;  o Self reported problem: gets hungry again before bed, so will eat seconds fo supper  Current exercise: none; limited by back pain  Current blood glucose readings:  o Fasting: 139-179 o After lunch: ~140-180s; highest 185  Cardiovascular risk reduction (afib, HTN; follows w/ Dr. Ubaldo Glassing) o Current hypertensive regimen: flecainide 100 mg BID, metoprolol tartrate 25 mg BID, lisinopril 20 mg, HCTZ 25 mg daily; BP at goal at recent office visits. o Current hyperlipidemia regimen: rosuvastatin 10 mg daily; last LDL at goal <70 o Current antiplatelet regimen: ASA 81 mg (CHADS2VASC 3, but has been in NSR, Dr. Ubaldo Glassing elected to continue ASA instead of DOAC)  Urinary incontinence: oxybutynin  5 mg BID  Back pain/R side pain: currently being worked up by Dr. Nicki Reaper. Nothing found on x ray. Upcoming CT scan.  Pharmacist Clinical Goal(s):   Over the next 90 days, patient will work with PharmD and primary care provider to address optimized medication management  Interventions:  Comprehensive medication review performed, medication list updated in electronic medical record  Inter-disciplinary care team collaboration (see longitudinal plan of care)  Reviewed goal A1c, goal  fasting, and goal 2 hour post prandial.   Discussed next steps. Dicussed addition of SGLT2, though cost-prohibitive at this time as patient in the Medicare Coverage Gap and over income for assistance. Discussed addition of glipizide w/ supper, though risk of hypoglycemia and weight gain. Discussed use of CGM to evaluate dietary choices. Medicare would not cover as patient is not on insulin, but she could pay cash price of $75/month for [employer]. Also suggested a 2 week sample CGM. Patient suggested keeping a food diary, and elects to do this first for 4 weeks. I encouraged her to continue to check fasting and post-lunch, but add post-supper glucose checks to evaluate impact of dinner choices that she makes  Discussed suggestions for better post-supper snacks than leftovers from supper. Encouraged high protein, low carbohydrate snacks.  Patient Self Care Activities:   Patient will check blood glucose BID-TID, document, and provide at future appointments  Patient will take medications as prescribed  Patient will report any questions or concerns to provider   Please see past updates related to this goal by clicking on the "Past Updates" button in the selected goal         The patient verbalized understanding of instructions provided today and declined a print copy of patient instruction materials.   Plan:  - Scheduled f/u call in ~ 4 weeks  Catie Darnelle Maffucci, PharmD, [place], [place] Pharmacist [place] [phone]

## 2020-05-30 ENCOUNTER — Other Ambulatory Visit: Payer: Self-pay

## 2020-05-30 ENCOUNTER — Ambulatory Visit
Admission: RE | Admit: 2020-05-30 | Discharge: 2020-05-30 | Disposition: A | Discharge status: Home / Self Care | Payer: Medicare Other | Source: Ambulatory Visit | Attending: Internal Medicine | Admitting: Internal Medicine

## 2020-05-30 DIAGNOSIS — R1011 Right upper quadrant pain: Secondary | ICD-10-CM | POA: Diagnosis present

## 2020-05-30 MED ORDER — IOHEXOL 350 MG/ML SOLN
125.0000 mL | Freq: Once | INTRAVENOUS | Status: AC | PRN
  Administered 2020-05-30: 125 mL via INTRAVENOUS

## 2020-05-31 ENCOUNTER — Ambulatory Visit: Payer: Medicare Other | Admitting: Physical Therapy

## 2020-06-02 ENCOUNTER — Other Ambulatory Visit: Payer: Self-pay | Admitting: Internal Medicine

## 2020-06-02 ENCOUNTER — Ambulatory Visit: Payer: Medicare Other | Admitting: Internal Medicine

## 2020-06-02 DIAGNOSIS — Q891 Congenital malformations of adrenal gland: Secondary | ICD-10-CM

## 2020-06-02 DIAGNOSIS — Z1211 Encounter for screening for malignant neoplasm of colon: Secondary | ICD-10-CM

## 2020-06-02 DIAGNOSIS — R109 Unspecified abdominal pain: Secondary | ICD-10-CM

## 2020-06-02 NOTE — Progress Notes (Signed)
Order placed for GI referral.   

## 2020-06-02 NOTE — Progress Notes (Signed)
Order placed for referral to endocrinology.  

## 2020-06-04 ENCOUNTER — Other Ambulatory Visit: Payer: Self-pay | Admitting: Internal Medicine

## 2020-06-13 ENCOUNTER — Ambulatory Visit (INDEPENDENT_AMBULATORY_CARE_PROVIDER_SITE_OTHER): Payer: Medicare Other

## 2020-06-13 VITALS — Ht 63.5 in | Wt 299.0 lb

## 2020-06-13 DIAGNOSIS — Z0000 Encounter for general adult medical examination without abnormal findings: Secondary | ICD-10-CM

## 2020-06-13 NOTE — Patient Instructions (Addendum)
Christine Robertson , Thank you for taking time to come for your Medicare Wellness Visit. I appreciate your ongoing commitment to your health goals. Please review the following plan we discussed and let me know if I can assist you in the future.   These are the goals we discussed:  Goal: Reduce sugar intake; portion control   This is a list of the screening recommended for you and due dates:   Immunizations Immunization History  Administered Date(s) Administered  . Fluad Quad(high Dose 66+) 05/10/2020  . Influenza Split 06/06/2012, 06/03/2014  . Influenza,inj,Quad PF,6+ Mos 06/23/2013, 05/18/2015, 07/16/2016, 07/03/2017  . PFIZER SARS-COV-2 Vaccination 10/16/2019, 11/11/2019  . Pneumococcal Polysaccharide-23 08/31/2016  . Zoster 06/01/2015   Keep all routine maintenance appointments.   Follow up CCM 06/22/20 @ 11:45  Cpe 07/14/20 @ 3:30  Advanced directives: not yet completed  Conditions/risks identified: none new  Follow up in one year for your annual wellness visit.   Preventive Care 66 Years and Older, Female Preventive care refers to lifestyle choices and visits with your health care provider that can promote health and wellness. What does preventive care include?  A yearly physical exam. This is also called an annual well check.  Dental exams once or twice a year.  Routine eye exams. Ask your health care provider how often you should have your eyes checked.  Personal lifestyle choices, including:  Daily care of your teeth and gums.  Regular physical activity.  Eating a healthy diet.  Avoiding tobacco and drug use.  Limiting alcohol use.  Practicing safe sex.  Taking low-dose aspirin every day.  Taking vitamin and mineral supplements as recommended by your health care provider. What happens during an annual well check? The services and screenings done by your health care provider during your annual well check will depend on your age, overall health, lifestyle  risk factors, and family history of disease. Counseling  Your health care provider may ask you questions about your:  Alcohol use.  Tobacco use.  Drug use.  Emotional well-being.  Home and relationship well-being.  Sexual activity.  Eating habits.  History of falls.  Memory and ability to understand (cognition).  Work and work Astronomer.  Reproductive health. Screening  You may have the following tests or measurements:  Height, weight, and BMI.  Blood pressure.  Lipid and cholesterol levels. These may be checked every 5 years, or more frequently if you are over 62 years old.  Skin check.  Lung cancer screening. You may have this screening every year starting at age 63 if you have a 30-pack-year history of smoking and currently smoke or have quit within the past 15 years.  Fecal occult blood test (FOBT) of the stool. You may have this test every year starting at age 41.  Flexible sigmoidoscopy or colonoscopy. You may have a sigmoidoscopy every 5 years or a colonoscopy every 10 years starting at age 47.  Hepatitis C blood test.  Hepatitis B blood test.  Sexually transmitted disease (STD) testing.  Diabetes screening. This is done by checking your blood sugar (glucose) after you have not eaten for a while (fasting). You may have this done every 1-3 years.  Bone density scan. This is done to screen for osteoporosis. You may have this done starting at age 49.  Mammogram. This may be done every 1-2 years. Talk to your health care provider about how often you should have regular mammograms. Talk with your health care provider about your test results, treatment options,  and if necessary, the need for more tests. Vaccines  Your health care provider may recommend certain vaccines, such as:  Influenza vaccine. This is recommended every year.  Tetanus, diphtheria, and acellular pertussis (Tdap, Td) vaccine. You may need a Td booster every 10 years.  Zoster vaccine.  You may need this after age 66.  Pneumococcal 13-valent conjugate (PCV13) vaccine. One dose is recommended after age 66.  Pneumococcal polysaccharide (PPSV23) vaccine. One dose is recommended after age 66. Talk to your health care provider about which screenings and vaccines you need and how often you need them. This information is not intended to replace advice given to you by your health care provider. Make sure you discuss any questions you have with your health care provider. Document Released: 09/16/2015 Document Revised: 05/09/2016 Document Reviewed: 06/21/2015 Elsevier Interactive Patient Education  2017 [place] Prevention in the Home Falls can cause injuries. They can happen to people of all ages. There are many things you can do to make your home safe and to help prevent falls. What can I do on the outside of my home?  Regularly fix the edges of walkways and driveways and fix any cracks.  Remove anything that might make you trip as you walk through a door, such as a raised step or threshold.  Trim any bushes or trees on the path to your home.  Use bright outdoor lighting.  Clear any walking paths of anything that might make someone trip, such as rocks or tools.  Regularly check to see if handrails are loose or broken. Make sure that both sides of any steps have handrails.  Any raised decks and porches should have guardrails on the edges.  Have any leaves, snow, or ice cleared regularly.  Use sand or salt on walking paths during winter.  Clean up any spills in your garage right away. This includes oil or grease spills. What can I do in the bathroom?  Use night lights.  Install grab bars by the toilet and in the tub and shower. Do not use towel bars as grab bars.  Use non-skid mats or decals in the tub or shower.  If you need to sit down in the shower, use a plastic, non-slip stool.  Keep the floor dry. Clean up any water that spills on the floor as soon  as it happens.  Remove soap buildup in the tub or shower regularly.  Attach bath mats securely with double-sided non-slip rug tape.  Do not have throw rugs and other things on the floor that can make you trip. What can I do in the bedroom?  Use night lights.  Make sure that you have a light by your bed that is easy to reach.  Do not use any sheets or blankets that are too big for your bed. They should not hang down onto the floor.  Have a firm chair that has side arms. You can use this for support while you get dressed.  Do not have throw rugs and other things on the floor that can make you trip. What can I do in the kitchen?  Clean up any spills right away.  Avoid walking on wet floors.  Keep items that you use a lot in easy-to-reach places.  If you need to reach something above you, use a strong step stool that has a grab bar.  Keep electrical cords out of the way.  Do not use floor polish or wax that makes floors slippery. If  you must use wax, use non-skid floor wax.  Do not have throw rugs and other things on the floor that can make you trip. What can I do with my stairs?  Do not leave any items on the stairs.  Make sure that there are handrails on both sides of the stairs and use them. Fix handrails that are broken or loose. Make sure that handrails are as long as the stairways.  Check any carpeting to make sure that it is firmly attached to the stairs. Fix any carpet that is loose or worn.  Avoid having throw rugs at the top or bottom of the stairs. If you do have throw rugs, attach them to the floor with carpet tape.  Make sure that you have a light switch at the top of the stairs and the bottom of the stairs. If you do not have them, ask someone to add them for you. What else can I do to help prevent falls?  Wear shoes that:  Do not have high heels.  Have rubber bottoms.  Are comfortable and fit you well.  Are closed at the toe. Do not wear sandals.  If  you use a stepladder:  Make sure that it is fully opened. Do not climb a closed stepladder.  Make sure that both sides of the stepladder are locked into place.  Ask someone to hold it for you, if possible.  Clearly mark and make sure that you can see:  Any grab bars or handrails.  First and last steps.  Where the edge of each step is.  Use tools that help you move around (mobility aids) if they are needed. These include:  Canes.  Walkers.  Scooters.  Crutches.  Turn on the lights when you go into a dark area. Replace any light bulbs as soon as they burn out.  Set up your furniture so you have a clear path. Avoid moving your furniture around.  If any of your floors are uneven, fix them.  If there are any pets around you, be aware of where they are.  Review your medicines with your doctor. Some medicines can make you feel dizzy. This can increase your chance of falling. Ask your doctor what other things that you can do to help prevent falls. This information is not intended to replace advice given to you by your health care provider. Make sure you discuss any questions you have with your health care provider. Document Released: 06/16/2009 Document Revised: 01/26/2016 Document Reviewed: 09/24/2014 Elsevier Interactive Patient Education  2017 [employer].

## 2020-06-13 NOTE — Progress Notes (Addendum)
Subjective:   Christine Robertson is a 64 y.o. female who presents for an Initial Medicare Annual Wellness Visit.  Review of Systems    No ROS.  Medicare Wellness Virtual Visit.   Cardiac Risk Factors include: advanced age (>34men, >33 women)     Objective:    Today's Vitals   06/13/20 1335  Weight: 299 lb (135.6 kg)  Height: 5' 3.5" (1.613 m)   Body mass index is 52.13 kg/m.  Advanced Directives 06/13/2020  Does Patient Have a Medical Advance Directive? No  Would patient like information on creating a medical advance directive? No - Patient declined    Current Medications (verified) Outpatient Encounter Medications as of 06/13/2020  Medication Sig  . Ascorbic Acid (VITAMIN C) 500 MG CAPS Take 500 mg by mouth daily.  Marland Kitchen aspirin 81 MG tablet Take 81 mg by mouth daily.  . beclomethasone (QVAR) 40 MCG/ACT inhaler Inhale 1 puff into the lungs 2 (two) times daily as needed.   . cetirizine (ZYRTEC) 10 MG tablet Take 10 mg by mouth daily.  . flecainide (TAMBOCOR) 100 MG tablet TAKE 1 TABLET BY MOUTH TWICE DAILY  . hydrochlorothiazide (HYDRODIURIL) 25 MG tablet TAKE ONE TABLET BY MOUTH DAILY  . lisinopril (ZESTRIL) 20 MG tablet TAKE ONE TABLET BY MOUTH DAILY  . metFORMIN (GLUCOPHAGE) 1000 MG tablet TAKE ONE TABLET BY MOUTH TWICE A DAY WITH MEALS  . metoprolol tartrate (LOPRESSOR) 25 MG tablet Take 25 mg by mouth 2 (two) times daily.  . Multiple Vitamin (MULTIVITAMIN) tablet Take 1 tablet by mouth daily.  Marland Kitchen omeprazole (PRILOSEC) 20 MG capsule TAKE 1 CAPSULE BY MOUTH TWICE DAILY (Patient taking differently: TAKE 1 CAPSULE BY MOUTH  DAILY)  . oxybutynin (DITROPAN) 5 MG tablet TAKE ONE TABLET BY MOUTH TWICE A DAY  . rosuvastatin (CRESTOR) 10 MG tablet TAKE ONE TABLET BY MOUTH DAILY  . Semaglutide, 1 MG/DOSE, (OZEMPIC, 1 MG/DOSE,) 2 MG/1.5ML SOPN Inject 1 mg into the skin once a week.  . triamcinolone cream (KENALOG) 0.1 % Apply 1 application topically 2 (two) times daily.   No  facility-administered encounter medications on file as of 06/13/2020.    Allergies (verified) Erythromycin, Augmentin [amoxicillin-pot clavulanate], and Demerol [meperidine]   History: Past Medical History:  Diagnosis Date  . Abnormal results of thyroid function studies   . Allergy    hay fever  . Angina pectoris, unspecified (HCC)   . Arthritis   . Atrial fibrillation, unspecified   . Chicken pox   . Cold hands   . Congenital anomaly of adrenal gland   . Diabetes mellitus    type 2, uncomplicated  . Essential hypertension, benign   . GERD (gastroesophageal reflux disease)   . IBS (irritable bowel syndrome)   . Malaise   . Morbid obesity (HCC)   . Murmur   . Pain in soft tissues of limb   . Proteinuria   . Pure hypercholesterolemia   . Rash and other nonspecific skin eruption   . Sleep apnea    Obstructive; Uses C-Pap machine  . Urinary incontinence    Past Surgical History:  Procedure Laterality Date  . ABDOMINAL HYSTERECTOMY  2010  . BLADDER SURGERY  1967   stretching of bladder  . BREAST BIOPSY Left 03/28/2015   done at Dr. Rutherford Nail office. fibrocystic changes  . COLONOSCOPY    . COLONOSCOPY WITH PROPOFOL N/A 09/09/2015   Procedure: COLONOSCOPY WITH PROPOFOL;  Surgeon: Scot Jun, MD;  Location[facility] ENDOSCOPY;  Service: Endoscopy;  Laterality: N/A;  . ESOPHAGOGASTRODUODENOSCOPY    . TONSILLECTOMY AND ADENOIDECTOMY  1959  . URETHRAL DILATION     Family History  Problem Relation Age of Onset  . Diabetes Mother   . Cancer Father        lung  . Breast cancer Maternal Grandmother 60  . Breast cancer Maternal Aunt 60   Social History   Socioeconomic History  . Marital status: Married    Spouse name: Not on file  . Number of children: Not on file  . Years of education: Not on file  . Highest education level: Not on file  Occupational History  . Not on file  Tobacco Use  . Smoking status: Never Smoker  . Smokeless tobacco: Never Used  Substance and  Sexual Activity  . Alcohol use: No    Alcohol/week: 0.0 standard drinks  . Drug use: No  . Sexual activity: Not on file  Other Topics Concern  . Not on file  Social History Narrative  . Not on file   Social Determinants of Health   Financial Resource Strain: Medium Risk  . Difficulty of Paying Living Expenses: Somewhat hard  Food Insecurity: No Food Insecurity  . Worried About Programme researcher, broadcasting/film/video in the Last Year: Never true  . Ran Out of Food in the Last Year: Never true  Transportation Needs: No Transportation Needs  . Lack of Transportation (Medical): No  . Lack of Transportation (Non-Medical): No  Physical Activity: Unknown  . Days of Exercise per Week: Not on file  . Minutes of Exercise per Session: 0 min  Stress: No Stress Concern Present  . Feeling of Stress : Not at all  Social Connections: Unknown  . Frequency of Communication with Friends and Family: More than three times a week  . Frequency of Social Gatherings with Friends and Family: More than three times a week  . Attends Religious Services: Not on file  . Active Member of Clubs or Organizations: Not on file  . Attends Banker Meetings: Not on file  . Marital Status: Not on file    Tobacco Counseling Counseling given: Not Answered   Clinical Intake:  Pre-visit preparation completed: Yes        Diabetes: Yes (Followed by pcp)  How often do you need to have someone help you when you read instructions, pamphlets, or other written materials from your doctor or pharmacy?: 1 - Never Interpreter Needed?: No      Activities of Daily Living In your present state of health, do you have any difficulty performing the following activities: 06/13/2020  Hearing? N  Vision? N  Difficulty concentrating or making decisions? N  Walking or climbing stairs? N  Dressing or bathing? N  Doing errands, shopping? N  Preparing Food and eating ? N  Using the Toilet? N  In the past six months, have you  accidently leaked urine? N  Do you have problems with loss of bowel control? N  Managing your Medications? N  Managing your Finances? N  Housekeeping or managing your Housekeeping? N  Some recent data might be hidden    Patient Care Team: Dale Patterson, MD as PCP - General (Internal Medicine) Dale [place], MD (Internal Medicine) Lemar Livings, Merrily Pew, MD (General Surgery) Lourena Simmonds, RPH-CPP (Pharmacist)  Indicate any recent Medical Services you may have received from other than [facility] providers in the past year (date may be approximate).     Assessment:   This is a routine  wellness examination for Takelia.  I connected with Inell today by telephone and verified that I am speaking with the correct person using two identifiers. Location patient: home Location provider: work Persons participating in the virtual visit: patient, Engineer, civil (consulting).    I discussed the limitations, risks, security and privacy concerns of performing an evaluation and management service by telephone and the availability of in person appointments. The patient expressed understanding and verbally consented to this telephonic visit.    Interactive audio and video telecommunications were attempted between this provider and patient, however failed, due to patient having technical difficulties OR patient did not have access to video capability.  We continued and completed visit with audio only.  Some vital signs may be absent or patient reported.   Hearing/Vision screen  Hearing Screening   125Hz  250Hz  500Hz  1000Hz  2000Hz  3000Hz  4000Hz  6000Hz  8000Hz   Right ear:           Left ear:           Comments: Patient is able to hear conversational tones without difficulty.  No issues reported.  Vision Screening Comments: Wears corrective lenses Visual acuity not assessed, virtual visit.  They have seen their ophthalmologist in the last 12 months.   Goal: Reduce sugar intake; portion control  Dietary issues and  exercise activities discussed: Current Exercise Habits: Home exercise routine, Time (Minutes): 10, Frequency (Times/Week): 7, Weekly Exercise (Minutes/Week): 70, Intensity: Mild  Depression Screen PHQ 2/9 Scores 06/13/2020 01/08/2020 10/06/2019 04/25/2017 03/11/2015 12/09/2014  PHQ - 2 Score 0 0 0 0 0 0  PHQ- 9 Score - 2 0 - - -    Fall Risk Fall Risk  06/13/2020 05/10/2020 01/08/2020 10/06/2019 04/25/2017  Falls in the past year? 0 0 0 1 No  Number falls in past yr: 0 - - 0 -  Injury with Fall? - - - 1 -  Follow up Falls evaluation completed Falls evaluation completed Falls evaluation completed Falls evaluation completed -   Handrails in use when climbing stairs? Yes Home free of loose throw rugs in walkways, pet beds, electrical cords, etc? Yes  Adequate lighting in your home to reduce risk of falls? Yes   ASSISTIVE DEVICES UTILIZED TO PREVENT FALLS: Life alert? No  Use of a cane, walker or w/c? No   TIMED UP AND GO: Was the test performed? No . Virtual visit.   Cognitive Function:  Patient is alert and oriented x3.  Denies difficulty focusing, concentrating, memory loss.      Immunizations Immunization History  Administered Date(s) Administered  . Fluad Quad(high Dose 65+) 05/10/2020  . Influenza Split 06/06/2012, 06/03/2014  . Influenza,inj,Quad PF,6+ Mos 06/23/2013, 05/18/2015, 07/16/2016, 07/03/2017  . PFIZER SARS-COV-2 Vaccination 10/16/2019, 11/11/2019  . Pneumococcal Polysaccharide-23 08/31/2016  . Zoster 06/01/2015    TDAP status: Due, Education has been provided regarding the importance of this vaccine. Advised may receive this vaccine at local pharmacy or Health Dept. Aware to provide a copy of the vaccination record if obtained from local pharmacy or Health Dept. Verbalized acceptance and understanding.  Deferred.    Health Maintenance Health Maintenance  Topic Date Due  . TETANUS/TDAP  Never done  . FOOT EXAM  10/04/2018  . OPHTHALMOLOGY EXAM  01/15/2019  . DEXA SCAN   Never done  . PNA vac Low Risk Adult (1 of 2 - PCV13) 04/09/2019  . COLONOSCOPY  09/08/2020  . HEMOGLOBIN A1C  11/07/2020  . MAMMOGRAM  02/21/2021  . INFLUENZA VACCINE  Completed  . COVID-19 Vaccine  Completed  . Hepatitis C Screening  Completed   Dental Screening: Recommended annual dental exams for proper oral hygiene.  Community Resource Referral / Chronic Care Management: CRR required this visit?  No   CCM required this visit?  No      Plan:   Keep all routine maintenance appointments.   Follow up CCM 06/22/20 @ 11:45  Cpe 07/14/20 @ 3:30  I have personally reviewed and noted the following in the patient's chart:   . Medical and social history . Use of alcohol, tobacco or illicit drugs  . Current medications and supplements . Functional ability and status . Nutritional status . Physical activity . Advanced directives . List of other physicians . Hospitalizations, surgeries, and ER visits in previous 12 months . Vitals . Screenings to include cognitive, depression, and falls . Referrals and appointments  In addition, I have reviewed and discussed with patient certain preventive protocols, quality metrics, and best practice recommendations. A written personalized care plan for preventive services as well as general preventive health recommendations were provided to patient via mychart.     Ashok PallOBrien-Blaney, Loreal Schuessler L, LPN   16/10/960410/07/2020    Reviewed above information.  Agree with assessment and plan.    Dr Lorin PicketScott

## 2020-06-22 ENCOUNTER — Ambulatory Visit (INDEPENDENT_AMBULATORY_CARE_PROVIDER_SITE_OTHER): Payer: Medicare Other | Admitting: Pharmacist

## 2020-06-22 DIAGNOSIS — I4891 Unspecified atrial fibrillation: Secondary | ICD-10-CM

## 2020-06-22 DIAGNOSIS — E1165 Type 2 diabetes mellitus with hyperglycemia: Secondary | ICD-10-CM

## 2020-06-22 DIAGNOSIS — I10 Essential (primary) hypertension: Secondary | ICD-10-CM

## 2020-06-22 DIAGNOSIS — E7800 Pure hypercholesterolemia, unspecified: Secondary | ICD-10-CM

## 2020-06-22 NOTE — Patient Instructions (Signed)
Visit Information  Goals Addressed              This Visit's Progress     Patient Stated   .  "I want to be healthy" (pt-stated)        CARE PLAN ENTRY (see longtitudinal plan of care for additional care plan information)  Current Barriers:  . Social, financial, and community needs:  o Denies any needs at this time.  . Diabetes: uncontrolled; complicated by chronic medical conditions including hx Afib, HLD, HTN, most recent A1c 8.9% . Most recent eGFR: ~86 mL/min . Current antihyperglycemic regimen: metformin 1000 mg BID, Ozempic 1 mg weekly o Hx- pioglitazone; unsure why this was d/c o Over income for GLP1 assistance through [employer] . Current meal patterns: has been trying to cut back on carbohydrates, has not been writing down food regularly   o Breakfast: oatmeal + egg o Lunch: 1/2 sandwich (lower sodium [place]); tomatoes; other vegetables;  o Supper: Grilled chicken, vegetables; vegetable beef soup; o Desserts: avoiding; every couple of weeks will treat themselves to ice cream treat o Drinks: water, diet coke . Current exercise: none; limited by back pain; does note that she has been watching her grandkids more often recently . Current blood glucose readings:  o Fasting: 150s (range 140s-180s) o After supper: 165-170s . Cardiovascular risk reduction (afib, HTN; follows w/ Dr. Ubaldo Glassing) o Current hypertensive regimen: flecainide 100 mg BID, metoprolol tartrate 25 mg BID, lisinopril 20 mg, HCTZ 25 mg daily; BP at goal at recent office visits o Current hyperlipidemia regimen: rosuvastatin 10 mg daily; last LDL at goal <70 o Current antiplatelet regimen: ASA 81 mg (CHADS2VASC 3, but has been in NSR, Dr. Ubaldo Glassing elected to continue ASA instead of DOAC) . Urinary incontinence: oxybutynin 5 mg BID  Pharmacist Clinical Goal(s):  Marland Kitchen Over the next 90 days, patient will work with PharmD and primary care provider to address optimized medication  management  Interventions: . Comprehensive medication review performed, medication list updated in electronic medical record . Inter-disciplinary care team collaboration (see longitudinal plan of care) . Discussed glucose control. Difficult to ascertain if there will be improvement in A1c with next lab work. Unable to add SGLT2 at this time d/t cost, as patient is in the Coverage Gap and does not qualify for medication assistance. Would avoid pioglitazone d/t cardiovascular risk and patient's baseline afib, risk of CHF. Could consider sulfonlyurea with biggest meal if patient's next A1c remains uncontrolled. Will defer changes until labwork with next PCP appt. . Continue antihypertensive regimen; fill hx up to date . Continue antihyperlipemic regimen; fill hx up to date  Patient Self Care Activities:  . Patient will check blood glucose BID-TID, document, and provide at future appointments . Patient will take medications as prescribed . Patient will keep a food diary to evaluate impact of meals she is eating. . Patient will report any questions or concerns to provider   Please see past updates related to this goal by clicking on the "Past Updates" button in the selected goal         The patient verbalized understanding of instructions provided today and declined a print copy of patient instruction materials.  Plan:  - Scheduled f/u call in ~ 8 weeks (PCP visit in ~ 4 weeks)  Catie Darnelle Maffucci, PharmD, [place], [place] [phone]

## 2020-06-22 NOTE — Chronic Care Management (AMB) (Signed)
Chronic Care Management   Follow Up Note   06/22/2020 Name: Christine Robertson MRN: [id number] DOB: [birth year]  Referred by: Einar Pheasant, MD Reason for referral : Chronic Care Management (Medication Management)   Christine Robertson is a 64 y.o. year old female who is a primary care patient of Einar Pheasant, MD. The CCM team was consulted for assistance with chronic disease management and care coordination needs.    Contacted patient for medication management review.  Review of patient status, including review of consultants reports, relevant laboratory and other test results, and collaboration with appropriate care team members and the patient's provider was performed as part of comprehensive patient evaluation and provision of chronic care management services.    SDOH (Social Determinants of Health) assessments performed: Yes See Care Plan activities for detailed interventions related to SDOH)  SDOH Interventions     Most Recent Value  SDOH Interventions  Financial Strain Interventions Other (Comment)  [manufacturer assistance review]       Outpatient Encounter Medications as of 06/22/2020  Medication Sig Note   Ascorbic Acid (VITAMIN C) 500 MG CAPS Take 500 mg by mouth daily.    aspirin 81 MG tablet Take 81 mg by mouth daily.    beclomethasone (QVAR) 40 MCG/ACT inhaler Inhale 1 puff into the lungs 2 (two) times daily as needed.     cetirizine (ZYRTEC) 10 MG tablet Take 10 mg by mouth daily.    flecainide (TAMBOCOR) 100 MG tablet TAKE 1 TABLET BY MOUTH TWICE DAILY    hydrochlorothiazide (HYDRODIURIL) 25 MG tablet TAKE ONE TABLET BY MOUTH DAILY    lisinopril (ZESTRIL) 20 MG tablet TAKE ONE TABLET BY MOUTH DAILY    metFORMIN (GLUCOPHAGE) 1000 MG tablet TAKE ONE TABLET BY MOUTH TWICE A DAY WITH MEALS    metoprolol tartrate (LOPRESSOR) 25 MG tablet Take 25 mg by mouth 2 (two) times daily.    Multiple Vitamin (MULTIVITAMIN) tablet Take 1 tablet by mouth daily.     omeprazole (PRILOSEC) 20 MG capsule TAKE 1 CAPSULE BY MOUTH TWICE DAILY (Patient taking differently: TAKE 1 CAPSULE BY MOUTH  DAILY) 11/19/2019: Once daily    oxybutynin (DITROPAN) 5 MG tablet TAKE ONE TABLET BY MOUTH TWICE A DAY    rosuvastatin (CRESTOR) 10 MG tablet TAKE ONE TABLET BY MOUTH DAILY    Semaglutide, 1 MG/DOSE, (OZEMPIC, 1 MG/DOSE,) 2 MG/1.5ML SOPN Inject 1 mg into the skin once a week.    triamcinolone cream (KENALOG) 0.1 % Apply 1 application topically 2 (two) times daily.    No facility-administered encounter medications on file as of 06/22/2020.     Objective:   Goals Addressed              This Visit's Progress     Patient Stated     "I want to be healthy" (pt-stated)        CARE PLAN ENTRY (see longtitudinal plan of care for additional care plan information)  Current Barriers:   Social, financial, and community needs:  o Denies any needs at this time.   Diabetes: uncontrolled; complicated by chronic medical conditions including hx Afib, HLD, HTN, most recent A1c 8.9%  Most recent eGFR: ~86 mL/min  Current antihyperglycemic regimen: metformin 1000 mg BID, Ozempic 1 mg weekly o Hx- pioglitazone; unsure why this was d/c o Over income for GLP1 assistance through [employer]  Current meal patterns: has been trying to cut back on carbohydrates, has not been writing down food regularly   o Breakfast:  oatmeal + egg o Lunch: 1/2 sandwich (lower sodium [place]); tomatoes; other vegetables;  o Supper: Grilled chicken, vegetables; vegetable beef soup; o Desserts: avoiding; every couple of weeks will treat themselves to ice cream treat o Drinks: water, diet coke  Current exercise: none; limited by back pain; does note that she has been watching her grandkids more often recently  Current blood glucose readings:  o Fasting: 150s (range 140s-180s) o After supper: 165-170s  Cardiovascular risk reduction (afib, HTN; follows w/ Dr. Ubaldo Glassing) o Current hypertensive  regimen: flecainide 100 mg BID, metoprolol tartrate 25 mg BID, lisinopril 20 mg, HCTZ 25 mg daily; BP at goal at recent office visits o Current hyperlipidemia regimen: rosuvastatin 10 mg daily; last LDL at goal <70 o Current antiplatelet regimen: ASA 81 mg (CHADS2VASC 3, but has been in NSR, Dr. Ubaldo Glassing elected to continue ASA instead of DOAC)  Urinary incontinence: oxybutynin 5 mg BID  Pharmacist Clinical Goal(s):   Over the next 90 days, patient will work with PharmD and primary care provider to address optimized medication management  Interventions:  Comprehensive medication review performed, medication list updated in electronic medical record  Inter-disciplinary care team collaboration (see longitudinal plan of care)  Discussed glucose control. Difficult to ascertain if there will be improvement in A1c with next lab work. Unable to add SGLT2 at this time d/t cost, as patient is in the Coverage Gap and does not qualify for medication assistance. Would avoid pioglitazone d/t cardiovascular risk and patient's baseline afib, risk of CHF. Could consider sulfonlyurea with biggest meal if patient's next A1c remains uncontrolled. Will defer changes until labwork with next PCP appt.  Continue antihypertensive regimen; fill hx up to date  Continue antihyperlipemic regimen; fill hx up to date  Patient Self Care Activities:   Patient will check blood glucose BID-TID, document, and provide at future appointments  Patient will take medications as prescribed  Patient will keep a food diary to evaluate impact of meals she is eating.  Patient will report any questions or concerns to provider   Please see past updates related to this goal by clicking on the "Past Updates" button in the selected goal          Plan:  - Scheduled f/u call in ~ 8 weeks (PCP visit in ~ 4 weeks)  Catie Darnelle Maffucci, PharmD, [place], CPP Clinical Pharmacist [place] [phone]

## 2020-07-02 ENCOUNTER — Other Ambulatory Visit: Payer: Self-pay | Admitting: Internal Medicine

## 2020-07-14 ENCOUNTER — Other Ambulatory Visit: Payer: Self-pay

## 2020-07-14 ENCOUNTER — Telehealth (INDEPENDENT_AMBULATORY_CARE_PROVIDER_SITE_OTHER): Payer: Medicare Other | Admitting: Internal Medicine

## 2020-07-14 DIAGNOSIS — K219 Gastro-esophageal reflux disease without esophagitis: Secondary | ICD-10-CM | POA: Diagnosis not present

## 2020-07-14 DIAGNOSIS — G4733 Obstructive sleep apnea (adult) (pediatric): Secondary | ICD-10-CM

## 2020-07-14 DIAGNOSIS — E1165 Type 2 diabetes mellitus with hyperglycemia: Secondary | ICD-10-CM | POA: Diagnosis not present

## 2020-07-14 DIAGNOSIS — I890 Lymphedema, not elsewhere classified: Secondary | ICD-10-CM

## 2020-07-14 DIAGNOSIS — I4891 Unspecified atrial fibrillation: Secondary | ICD-10-CM

## 2020-07-14 DIAGNOSIS — M545 Low back pain, unspecified: Secondary | ICD-10-CM

## 2020-07-14 DIAGNOSIS — I10 Essential (primary) hypertension: Secondary | ICD-10-CM

## 2020-07-14 DIAGNOSIS — E7800 Pure hypercholesterolemia, unspecified: Secondary | ICD-10-CM

## 2020-07-14 NOTE — Progress Notes (Signed)
Patient ID: Christine Robertson, female   DOB: May 10, 1956, 64 y.o.   MRN: [id number]   Virtual Visit via videoNote  This visit type was conducted due to national recommendations for restrictions regarding the COVID-19 pandemic (e.g. social distancing).  This format is felt to be most appropriate for this patient at this time.  All issues noted in this document were discussed and addressed.  No physical exam was performed (except for noted visual exam findings with Video Visits).   I connected with Christine Robertson by a video enabled telemedicine application and verified that I am speaking with the correct person using two identifiers. Location patient: home Location provider: work  Persons participating in the virtual visit: patient, provider  The limitations, risks, security and privacy concerns of performing an evaluation and management service by video and the availability of in person appointments have been discussed.  It has also been discussed with the patient that there may be a patient responsible charge related to this service. The patient expressed understanding and agreed to proceed.   Reason for visit: scheduled follow up.    HPI: Follow up regarding diabetes, hypertension and hypercholesterolemia.  Has been having low back Robertson.  Worse with standing and walking.  She requested a prescription for walker with a seat.  If she uses a walker, no Robertson.  Discussed PT.  She is agreeable.  No chest Robertson.  Breathing stable.  No acid reflux reported.  No abdominal Robertson.  Bowels moving.  Has been eating more sweets and chips.  AM sugars averaging 147-199 and PM suars 120-129 and 160 after meal.     ROS: See pertinent positives and negatives per HPI.  Past Medical History:  Diagnosis Date  . Abnormal results of thyroid function studies   . Allergy    hay fever  . Angina pectoris, unspecified ([place])   . Arthritis   . Atrial fibrillation, unspecified   . Chicken pox   . Cold hands   .  Congenital anomaly of adrenal gland   . Diabetes mellitus    type 2, uncomplicated  . Essential hypertension, benign   . GERD (gastroesophageal reflux disease)   . IBS (irritable bowel syndrome)   . Malaise   . Morbid obesity ([place])   . Murmur   . Robertson in soft tissues of limb   . Proteinuria   . Pure hypercholesterolemia   . Rash and other nonspecific skin eruption   . Sleep apnea    Obstructive; Uses C-Pap machine  . Urinary incontinence     Past Surgical History:  Procedure Laterality Date  . ABDOMINAL HYSTERECTOMY  2010  . BLADDER SURGERY  1967   stretching of bladder  . BREAST BIOPSY Left 03/28/2015   done at Dr. Dwyane Luo office. fibrocystic changes  . COLONOSCOPY    . COLONOSCOPY WITH PROPOFOL N/A 09/09/2015   Procedure: COLONOSCOPY WITH PROPOFOL;  Surgeon: Manya Silvas, MD;  Location: [facility] ENDOSCOPY;  Service: Endoscopy;  Laterality: N/A;  . ESOPHAGOGASTRODUODENOSCOPY    . TONSILLECTOMY AND ADENOIDECTOMY  1959  . URETHRAL DILATION      Family History  Problem Relation Age of Onset  . Diabetes Mother   . Cancer Father        lung  . Breast cancer Maternal Grandmother 60  . Breast cancer Maternal Aunt 105    SOCIAL HX: reviewed.    Current Outpatient Medications:  .  Ascorbic Acid (VITAMIN C) 500 MG CAPS, Take 500 mg by mouth daily., Disp: ,  Rfl:  .  aspirin 81 MG tablet, Take 81 mg by mouth daily., Disp: , Rfl:  .  beclomethasone (QVAR) 40 MCG/ACT inhaler, Inhale 1 puff into the lungs 2 (two) times daily as needed. , Disp: , Rfl:  .  cetirizine (ZYRTEC) 10 MG tablet, Take 10 mg by mouth daily., Disp: , Rfl:  .  flecainide (TAMBOCOR) 100 MG tablet, TAKE 1 TABLET BY MOUTH TWICE DAILY, Disp: , Rfl:  .  hydrochlorothiazide (HYDRODIURIL) 25 MG tablet, TAKE ONE TABLET BY MOUTH DAILY, Disp: 30 tablet, Rfl: 0 .  lisinopril (ZESTRIL) 20 MG tablet, TAKE ONE TABLET BY MOUTH DAILY, Disp: 90 tablet, Rfl: 0 .  metFORMIN (GLUCOPHAGE) 1000 MG tablet, TAKE ONE TABLET BY MOUTH  TWICE A DAY WITH MEALS, Disp: 180 tablet, Rfl: 0 .  metoprolol tartrate (LOPRESSOR) 25 MG tablet, Take 25 mg by mouth 2 (two) times daily., Disp: , Rfl:  .  Multiple Vitamin (MULTIVITAMIN) tablet, Take 1 tablet by mouth daily., Disp: , Rfl:  .  omeprazole (PRILOSEC) 20 MG capsule, TAKE 1 CAPSULE BY MOUTH TWICE DAILY (Patient taking differently: TAKE 1 CAPSULE BY MOUTH  DAILY), Disp: 60 capsule, Rfl: 5 .  oxybutynin (DITROPAN) 5 MG tablet, TAKE ONE TABLET BY MOUTH TWICE A DAY, Disp: 180 tablet, Rfl: 0 .  rosuvastatin (CRESTOR) 10 MG tablet, TAKE ONE TABLET BY MOUTH DAILY, Disp: 90 tablet, Rfl: 3 .  Semaglutide, 1 MG/DOSE, (OZEMPIC, 1 MG/DOSE,) 2 MG/1.5ML SOPN, Inject 1 mg into the skin once a week., Disp: 9 mL, Rfl: 1 .  triamcinolone cream (KENALOG) 0.1 %, Apply 1 application topically 2 (two) times daily., Disp: 60 g, Rfl: 0  EXAM:  GENERAL: alert, oriented, appears well and in no acute distress  HEENT: atraumatic, conjunttiva clear, no obvious abnormalities on inspection of external nose and ears  NECK: normal movements of the head and neck  LUNGS: on inspection no signs of respiratory distress, breathing rate appears normal, no obvious gross SOB, gasping or wheezing  CV: no obvious cyanosis  PSYCH/NEURO: pleasant and cooperative, no obvious depression or anxiety, speech and thought processing grossly intact  ASSESSMENT AND PLAN:  Discussed the following assessment and plan:  Problem List Items Addressed This Visit    Obstructive sleep apnea    Continue cpap.       Lymphedema    Continue lymphedema pump.        Low back Robertson    Low back Robertson.  Worse with standing and walking.  Discussed PT.  Agreeable.        Relevant Orders   Ambulatory referral to Physical Therapy   Hypertension    Blood pressure as outlined.  Continue on lisinopril, hctz and metoprolol.  Follow pressures.  Follow metabolic panel.        Hypercholesteremia    On crestor.  Low cholesterol diet and  exercise.  Follow lipid panel and liver function tests.        GERD (gastroesophageal reflux disease)    On prilosec.  Upper symptoms controlled.        Diabetes mellitus ([place])    On ozempic.  Low carb diet and exercise.  Follow met b and a1c.  Check and record sugars and send in readings over the next few weeks.        Atrial fibrillation ([facility])    On flecainide, metoprolol and asprin.  Stable.  Follow.           I discussed the assessment and treatment plan  with the patient. The patient was provided an opportunity to ask questions and all were answered. The patient agreed with the plan and demonstrated an understanding of the instructions.   The patient was advised to call back or seek an in-person evaluation if the symptoms worsen or if the condition fails to improve as anticipated.    Einar Pheasant, MD

## 2020-07-18 ENCOUNTER — Encounter: Payer: Self-pay | Admitting: Internal Medicine

## 2020-07-18 ENCOUNTER — Other Ambulatory Visit: Payer: Self-pay | Admitting: Family

## 2020-07-18 ENCOUNTER — Other Ambulatory Visit: Payer: Self-pay | Admitting: Internal Medicine

## 2020-07-18 NOTE — Assessment & Plan Note (Addendum)
Blood pressure as outlined.  Continue on lisinopril, hctz and metoprolol.  Follow pressures.  Follow metabolic panel.

## 2020-07-18 NOTE — Assessment & Plan Note (Signed)
On ozempic.  Low carb diet and exercise.  Follow met b and a1c.  Check and record sugars and send in readings over the next few weeks.

## 2020-07-18 NOTE — Assessment & Plan Note (Signed)
On prilosec.  Upper symptoms controlled.   

## 2020-07-18 NOTE — Assessment & Plan Note (Signed)
On crestor.  Low cholesterol diet and exercise.  Follow lipid panel and liver function tests.   

## 2020-07-18 NOTE — Assessment & Plan Note (Signed)
Continue cpap.  

## 2020-07-18 NOTE — Assessment & Plan Note (Signed)
Low back pain.  Worse with standing and walking.  Discussed PT.  Agreeable.

## 2020-07-18 NOTE — Assessment & Plan Note (Signed)
Continue lymphedema pump.  

## 2020-07-18 NOTE — Assessment & Plan Note (Signed)
On flecainide, metoprolol and asprin.  Stable.  Follow.

## 2020-07-22 ENCOUNTER — Ambulatory Visit: Payer: Medicare Other | Admitting: Internal Medicine

## 2020-07-28 ENCOUNTER — Other Ambulatory Visit: Payer: Self-pay | Admitting: Internal Medicine

## 2020-08-03 ENCOUNTER — Ambulatory Visit (INDEPENDENT_AMBULATORY_CARE_PROVIDER_SITE_OTHER): Payer: Medicare Other | Admitting: Internal Medicine

## 2020-08-03 ENCOUNTER — Other Ambulatory Visit: Payer: Self-pay

## 2020-08-03 ENCOUNTER — Encounter: Payer: Self-pay | Admitting: Internal Medicine

## 2020-08-03 VITALS — BP 126/72 | HR 80 | Temp 98.4°F | Ht 63.5 in | Wt 290.4 lb

## 2020-08-03 DIAGNOSIS — G4733 Obstructive sleep apnea (adult) (pediatric): Secondary | ICD-10-CM

## 2020-08-03 NOTE — Patient Instructions (Signed)
Continue auto CPAP as prescribed 

## 2020-08-03 NOTE — Progress Notes (Signed)
@Patient  ID: , female    DOB: [birth year], 64 y.o.   MRN: [id number]   Referring provider: [id number], MD  **CPAP download 07/23/2020 100% compliance for days 97% compliance for greater than 4 hours  AHI reduced to 2.4 Auto CPAP 5-20 cm h20 Excellent compliance report  CC  follow up OSA  HPI: 64 year old female establish care with [facility] June 2021 for obstructive sleep apnea She was diagnosed with OSA in November 2007 Started on nocturnal CPAP She has a history of hypertension A. fib diabetes and hyperlipidemia  Overnight PSG November 2007 AHI of 26/h Hypoxia 80% She was started on CPAP 15 cm of water pressure    Discussed sleep data and reviewed with patient.  Encouraged proper weight management.  Discussed driving precautions and its relationship with hypersomnolence.  Discussed operating dangerous equipment and its relationship with hypersomnolence.  Discussed sleep hygiene, and benefits of a fixed sleep waked time.  The importance of getting eight or more hours of sleep discussed with patient.  Discussed limiting the use of the computer and television before bedtime.  Decrease naps during the day, so night time sleep will become enhanced.  Limit caffeine, and sleep deprivation.  HTN, stroke, and heart failure are potential risk factors.       SH : Retired, married, 2 adult kids, never smoker, no etoh/drugs.   FH : father lung cancer.   Allergies  Allergen Reactions  . Erythromycin Hives  . Augmentin [Amoxicillin-Pot Clavulanate] Hives  . Demerol [Meperidine] Nausea Only    Immunization History  Administered Date(s) Administered  . Fluad Quad(high Dose 65+) 05/10/2020  . Influenza Split 06/06/2012, 06/03/2014  . Influenza,inj,Quad PF,6+ Mos 06/23/2013, 05/18/2015, 07/16/2016, 07/03/2017  . PFIZER SARS-COV-2 Vaccination 10/16/2019, 11/11/2019  . Pneumococcal Polysaccharide-23 08/31/2016  . Zoster 06/01/2015    Past  Medical History:  Diagnosis Date  . Abnormal results of thyroid function studies   . Allergy    hay fever  . Angina pectoris, unspecified ([facility])   . Arthritis   . Atrial fibrillation, unspecified   . Chicken pox   . Cold hands   . Congenital anomaly of adrenal gland   . Diabetes mellitus    type 2, uncomplicated  . Essential hypertension, benign   . GERD (gastroesophageal reflux disease)   . IBS (irritable bowel syndrome)   . Malaise   . Morbid obesity ([facility])   . Murmur   . Pain in soft tissues of limb   . Proteinuria   . Pure hypercholesterolemia   . Rash and other nonspecific skin eruption   . Sleep apnea    Obstructive; Uses C-Pap machine  . Urinary incontinence     Tobacco History: Social History   Tobacco Use  Smoking Status Never Smoker  Smokeless Tobacco Never Used   Counseling given: Not Answered   Outpatient Medications Prior to Visit  Medication Sig Dispense Refill  . Ascorbic Acid (VITAMIN C) 500 MG CAPS Take 500 mg by mouth daily.    06/03/2015 aspirin 81 MG tablet Take 81 mg by mouth daily.    . beclomethasone (QVAR) 40 MCG/ACT inhaler Inhale 1 puff into the lungs 2 (two) times daily as needed.     . cetirizine (ZYRTEC) 10 MG tablet Take 10 mg by mouth daily.    . flecainide (TAMBOCOR) 100 MG tablet TAKE 1 TABLET BY MOUTH TWICE DAILY    . hydrochlorothiazide (HYDRODIURIL) 25 MG tablet TAKE ONE TABLET BY MOUTH DAILY 30 tablet 0  .  lisinopril (ZESTRIL) 20 MG tablet TAKE ONE TABLET BY MOUTH DAILY 90 tablet 0  . metFORMIN (GLUCOPHAGE) 1000 MG tablet TAKE ONE TABLET BY MOUTH TWICE A DAY WITH MEALS 180 tablet 0  . metoprolol tartrate (LOPRESSOR) 25 MG tablet Take 25 mg by mouth 2 (two) times daily.    . Multiple Vitamin (MULTIVITAMIN) tablet Take 1 tablet by mouth daily.    Marland Kitchen omeprazole (PRILOSEC) 20 MG capsule TAKE 1 CAPSULE BY MOUTH TWICE DAILY (Patient taking differently: TAKE 1 CAPSULE BY MOUTH  DAILY) 60 capsule 5  . oxybutynin (DITROPAN) 5 MG tablet TAKE ONE  TABLET BY MOUTH TWICE A DAY 180 tablet 0  . rosuvastatin (CRESTOR) 10 MG tablet TAKE ONE TABLET BY MOUTH DAILY 90 tablet 3  . Semaglutide, 1 MG/DOSE, (OZEMPIC, 1 MG/DOSE,) 2 MG/1.5ML SOPN Inject 1 mg into the skin once a week. 9 mL 1  . triamcinolone cream (KENALOG) 0.1 % Apply 1 application topically 2 (two) times daily. 60 g 0   No facility-administered medications prior to visit.      Review of Systems:  Gen:  Denies  fever, sweats, chills weight loss  HEENT: Denies blurred vision, double vision, ear pain, eye pain, hearing loss, nose bleeds, sore throat Cardiac:  No dizziness, chest pain or heaviness, chest tightness,edema, No JVD Resp:   No cough, -sputum production, -shortness of breath,-wheezing, -hemoptysis,  Gi: Denies swallowing difficulty, stomach pain, nausea or vomiting, diarrhea, constipation, bowel incontinence Gu:  Denies bladder incontinence, burning urine Ext:   Denies Joint pain, stiffness or swelling Skin: Denies  skin rash, easy bruising or bleeding or hives Endoc:  Denies polyuria, polydipsia , polyphagia or weight change Psych:   Denies depression, insomnia or hallucinations  Other:  All other systems negative   BP 126/72 (BP Location: Left Arm, Cuff Size: Large)   Pulse 80   Temp 98.4 F (36.9 C) (Temporal)   Ht 5' 3.5" (1.613 m)   Wt 290 lb 6.4 oz (131.7 kg)   SpO2 96%   BMI 50.64 kg/m   Physical Examination:   General Appearance: No distress  Neuro:without focal findings,  speech normal,  HEENT: PERRLA, EOM intact.   Pulmonary: normal breath sounds, No wheezing.  CardiovascularNormal S1,S2.  No m/r/g.   Abdomen: Benign, Soft, non-tender. Renal:  No costovertebral tenderness  GU:  Not performed at this time. Endoc: No evident thyromegaly Skin:   warm, no rashes, no ecchymosis  Extremities: normal, no cyanosis, clubbing. PSYCHIATRIC: Mood, affect within normal limits.   ALL OTHER ROS ARE NEGATIVE    Lab Results:   Assessment and  plan  Obstructive sleep apnea Previous AHI in 2007 was 26 Patient is compliant with her CPAP therapy She has excellent compliance with 100% for days and 97% for greater than 4 hours Her AHI is reduced to 2.4 Patient is on auto CPAP 5 to 20 cm water pressure Uses benefits from auto CPAP therapy  Obesity -recommend significant weight loss -recommend changing diet  Deconditioned state -Recommend increased daily activity and exercise  COVID-19 EDUCATION: The signs and symptoms of COVID-19 were discussed with the patient and how to seek care for testing.  The importance of social distancing was discussed today. Hand Washing Techniques and avoid touching face was advised.     MEDICATION ADJUSTMENTS/LABS AND TESTS ORDERED: Continue auto CPAP as prescribed   CURRENT MEDICATIONS REVIEWED AT LENGTH WITH PATIENT TODAY   Patient satisfied with Plan of action and management. All questions answered  Follow up in  1 year TOTAL TIME SPENT 21 mins  Aviva Wolfer Santiago Glad, M.D.  [facility]  Medical Director [facility] Medical Director [facility] Cardio-Pulmonary Department

## 2020-08-08 ENCOUNTER — Ambulatory Visit: Payer: Medicare Other | Attending: Internal Medicine

## 2020-08-08 ENCOUNTER — Other Ambulatory Visit: Payer: Self-pay

## 2020-08-08 DIAGNOSIS — M25551 Pain in right hip: Secondary | ICD-10-CM | POA: Diagnosis present

## 2020-08-08 DIAGNOSIS — G8929 Other chronic pain: Secondary | ICD-10-CM | POA: Insufficient documentation

## 2020-08-08 DIAGNOSIS — M6281 Muscle weakness (generalized): Secondary | ICD-10-CM | POA: Diagnosis present

## 2020-08-08 DIAGNOSIS — M545 Low back pain, unspecified: Secondary | ICD-10-CM | POA: Insufficient documentation

## 2020-08-08 NOTE — Patient Instructions (Addendum)
Access Code: [facility] URL: [email or web address] Date: 08/08/2020 Prepared by: Ria Comment  Exercises Supine Posterior Pelvic Tilt - 2 x daily - 7 x weekly - 2 sets - 10 reps - 3s hold Supine Single Knee to Chest Stretch - 2 x daily - 7 x weekly - 3 reps - 30s hold Seated Lumbar Flexion Stretch - 2 x daily - 7 x weekly - 3 reps - 30s hold Modified Thomas Stretch - 2 x daily - 7 x weekly - 3 reps - 30s hold

## 2020-08-08 NOTE — Therapy (Signed)
[facility] [address]. [place], [place], [address] Phone: [phone]   Fax:  [phone]  Physical Therapy Evaluation  Patient Details  Name: Christine Robertson MRN: [id number] Date of Birth: 15-Oct-1955 Referring Provider (PT): Dr. Lorin Picket   Encounter Date: 08/08/2020   PT End of Session - 08/08/20 1445    Visit Number 1    Number of Visits 17    Date for PT Re-Evaluation 10/03/20    Authorization Type eval: 08/08/20    PT Start Time 1320    PT Stop Time 1415    PT Time Calculation (min) 55 min    Activity Tolerance Patient tolerated treatment well    Behavior During Therapy [facility] for tasks assessed/performed           Past Medical History:  Diagnosis Date  . Abnormal results of thyroid function studies   . Allergy    hay fever  . Angina pectoris, unspecified (HCC)   . Arthritis   . Atrial fibrillation, unspecified   . Chicken pox   . Cold hands   . Congenital anomaly of adrenal gland   . Diabetes mellitus    type 2, uncomplicated  . Essential hypertension, benign   . GERD (gastroesophageal reflux disease)   . IBS (irritable bowel syndrome)   . Malaise   . Morbid obesity (HCC)   . Murmur   . Pain in soft tissues of limb   . Proteinuria   . Pure hypercholesterolemia   . Rash and other nonspecific skin eruption   . Sleep apnea    Obstructive; Uses C-Pap machine  . Urinary incontinence     Past Surgical History:  Procedure Laterality Date  . ABDOMINAL HYSTERECTOMY  2010  . BLADDER SURGERY  1967   stretching of bladder  . BREAST BIOPSY Left 03/28/2015   done at Dr. Rutherford Nail office. fibrocystic changes  . COLONOSCOPY    . COLONOSCOPY WITH PROPOFOL N/A 09/09/2015   Procedure: COLONOSCOPY WITH PROPOFOL;  Surgeon: Scot Jun, MD;  Location: [facility] ENDOSCOPY;  Service: Endoscopy;  Laterality: N/A;  . ESOPHAGOGASTRODUODENOSCOPY    . TONSILLECTOMY AND ADENOIDECTOMY  1959  . URETHRAL DILATION      There were no  vitals filed for this visit.    Subjective Assessment - 08/08/20 1343    Subjective Low back pain    Pertinent History Pt reports back pain for multiple years, progressively worsening. Insidious onset without any known trauma or injury. Pain is midline and radiates out laterally on both sides. No pain in buttocks, thighs, or LEs. Pain is worse with standing and walking which is impeding her function. She has been having R sided abdominal pain which was hurting for 3 months however has improved over the last month. She had an abdominal CT which showed slight enlargement in left adrenal mass. 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 Small fat containing umbilical hernia. Chronic changes as described above without acute abnormality. Pt also had plain film lumbar imaging 05/10/20 which showed no fracture or spondylolisthesis. There is moderate disc space narrowing at L2-3, L3-4, L4-5, L5-S1. There are anterior and lateral osteophytes at all levels. There is facet arthropathy at L3-4, L4-5, L5-S1 bilaterally. Over the course of the last month she has also been having R groin pain when trying to flex R hip to get into or out of the car.    How long can you stand  comfortably? 5 minutes    How long can you walk comfortably? 5 minutes    Diagnostic tests See history    Patient Stated Goals Decrease back pain, improve ability to clean house with less pain             SUBJECTIVE Chief complaint:  Midline low back pain History: Pt reports back pain for multiple years, progressively worsening. Insidious onset without any known trauma or injury. Pain is midline and radiates out laterally on both sides. No pain in buttocks, thighs, or LEs. Pain is worse with standing and walking which is impeding her function. She has been having R sided abdominal pain which was hurting for 3 months however has improved over the last month. She had an abdominal CT which showed  slight enlargement in left adrenal mass. 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 Small fat containing umbilical hernia. Chronic changes as described above without acute abnormality. Pt also had plain film lumbar imaging 05/10/20 which showed no fracture or spondylolisthesis. There is moderate disc space narrowing at L2-3, L3-4, L4-5, L5-S1. There are anterior and lateral osteophytes at all levels. There is facet arthropathy at L3-4, L4-5, L5-S1 bilaterally. Over the course of the last month she has also been having R groin pain when trying to flex R hip to get into or out of the car.  Referring Dx: midline low back pain Referring Provider: Dr. Lorin Picket Pain location: Midline, radiates out laterally, never into the buttocks or the legs Pain: Present 0/10, Best 0/10, Worst 8/10 Pain quality: pain quality: vague, ache Radiating pain: No  Numbness/Tingling: No 24 hour pain behavior: No change throughout the day, never wakes her up at night Aggravating factors: Standing and walking Easing factors: Sitting or laying down, minimal help from Tylenol/ibuprofen How long can you sit: no limitations How long can you stand: 5 minutes prior to onset of pain How long can you walk: 5 minutes prior to onset of pain History of back injury, pain, surgery, or therapy: No Follow-up appointment with MD: Yes, January 2022 with PCP Dominant hand: right Imaging: see history Falls in the last 6 months: No  Occupational demands: Retired Investment banker, corporate Hobbies: Reading Goals: Decrease back pain, improve ability to clean house with less pain Red flags (bowel/bladder changes, saddle paresthesia, personal history of cancer, chills/fever, night sweats, unrelenting pain, first onset of insidious LBP <20 y/o) Negative    OBJECTIVE  Mental Status Patient is oriented to person, place and time.  Recent memory is intact.  Remote memory is intact.  Attention span and  concentration are intact.  Expressive speech is intact.  Patient's fund of knowledge is within normal limits for educational level.  SENSATION: Not tested today. 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   MUSCULOSKELETAL: Tremor: None Bulk: Normal Tone: Normal No visible step-off along spinal column  Posture Lumbar lordosis: WNL Iliac crest height: equal bilaterally Lumbar lateral shift: negative Lower crossed syndrome (tight hip flexors and erector spinae; weak gluts and abs): negative  Gait Pt with excessive lateral trunk leaning during gait. No excessively wide BOS during gait. Gait speed is slowed but functional for limited community mobility   Palpation Pt denies pain with palpation to lumbar spinal column or paraspinals bilaterally;   Strength (out of 5) R/L 4*/4+ Hip flexion 4+*/4+ Hip ER 4+*/4+ Hip IR Strong and painless hip abduction in sitting Weak and painless hip adduction in sitting 4+*/5  Knee extension (pain in R hip) 4+/4+ Knee flexion (sitting) 4+/4+ Ankle dorsiflexion *Indicates pain   AROM (degrees) R/L (all movements include overpressure unless otherwise stated) Lumbar forward flexion (65): Grossly WNL Lumbar extension (30): Grossly WNL Lumbar lateral flexion (25): Grossly WNL bilaterally but painful bilaterally* Thoracic and Lumbar rotation (30 degrees): WNL Hip IR (0-45): Mild limitation bilaterally Hip ER (0-45): Mild limitation bilaterally Hip Flexion (0-125): Limited by habitus Hip extension (0-15): Pt unable to lay prone for testing *Indicates pain  Repeated Movements No centralization or peripheralization of symptoms with repeated lumbar extension or flexion.    Muscle Length Hamstrings: grossly 65 degrees bilaterally, painless   Passive Accessory Intervertebral Motion (PAIVM) Pt unable to lay prone for passive accessory motion testing    SPECIAL TESTS Lumbar Radiculopathy and Discogenic:  Centralization and Peripheralization (SN 92, -LR 0.12): Negative Slump (SN 83, -LR 0.32): R: Negative L: Negative SLR (SN 92, -LR 0.29): R: Negative L:  Negative Crossed SLR (SP 90): R: Negative L: Negative  Facet Joint: Extension-Rotation (SN 100, -LR 0.0): R: Negative L: Positive  Lumbar Spinal Stenosis: Lumbar quadrant (SN 70): R: Negative L: Positive  Hip: FABER (SN 81): R: Positive L: Negative FADIR (SN 94): R: Positive L: Negative Hip scour (SN 50): R: Positive L: Negative  SIJ:  Thigh Thrust (SN 88, -LR 0.18) : R: Not examined L: Not examined  Piriformis Syndrome: FAIR Test (SN 88, SP 83): R: Not examined L: Not examined  mODI: 46% FOTO: 44, predicted improvement to 56                 Objective measurements completed on examination: See above findings.               PT Education - 08/08/20 1445    Education Details Plan of care and HEP    Person(s) Educated Patient    Methods Explanation;Handout    Comprehension Verbalized understanding            PT Short Term Goals - 08/08/20 1453      PT SHORT TERM GOAL #1   Title Pt will be independent with HEP in order to improve strength and decrease back pain in order to improve pain-free function at home.    Time 4    Period Weeks    Status New    Target Date 09/05/20             PT Long Term Goals - 08/08/20 1453      PT LONG TERM GOAL #1   Title Pt will decrease mODI score by at least 13 points in order demonstrate clinically significant reduction in back pain/disability.    Baseline 08/08/20: 46%    Time 8    Period Weeks    Status New    Target Date 10/03/20      PT LONG TERM GOAL #2   Title Pt will improve FOTO to at least 56 in order to demonstrate significant improvement in back pain and function    Baseline 08/08/20: 44    Time 8    Period Weeks    Status New    Target Date 10/03/20      PT LONG TERM GOAL #3   Title Pt will decrease worst back pain as reported on  NPRS by at least 2 points in order to demonstrate clinically significant reduction in back pain.    Baseline 08/08/20: 8/10    Time 8    Period Weeks  Status New    Target Date 10/03/20                  Plan - 08/08/20 1413    Clinical Impression Statement Pt is a pleasant 64 year-old female referred for low back pain. PT examination reveals reproduction of pain with activities involve lumbar extension and reduction in pain with activities involve lumbar flexion.  Patient reports significant limitations in standing and walking tolerance however complete resolution of symptoms in sitting or laying down.  No signs/symptoms of lumbar radiculopathy or discogenic pain.  History and examination consistent with likely central canal stenosis spite the lack of bilateral lower extremity symptoms.  On an unrelated note patient has a new onset right hip/groin pain for the last month.  Patient with considerable pain and weakness with active right hip flexion.  She appears to have some irritation of her right hip flexor which will be addressed during therapy as well.  Pending progress with physical therapy she might benefit from additional imaging of her lumbar spine such as an MRI to assess whether she has central compression.  Patient provided flexion-based HEP as well as gentle right hip flexor stretches.  Patient encouraged to follow-up as scheduled.  She will benefit from skilled PT services to address deficits in back pain and return to pain-free function at home.    Personal Factors and Comorbidities Age;Comorbidity 3+    Comorbidities Obesity, diabetes, hypertension    Examination-Activity Limitations Locomotion Level;Stairs;Stand    Examination-Participation Restrictions Community Activity;Laundry;Meal Prep;Cleaning    Stability/Clinical Decision Making Unstable/Unpredictable    Clinical Decision Making High    Rehab Potential Fair    PT Frequency 2x / week    PT Duration 8 weeks    PT  Treatment/Interventions ADLs/Self Care Home Management;Aquatic Therapy;Biofeedback;Canalith Repostioning;Cryotherapy;Electrical Stimulation;Iontophoresis 4mg /ml Dexamethasone;Moist Heat;Traction;Ultrasound;DME Instruction;Gait training;Stair training;Functional mobility training;Therapeutic activities;Therapeutic exercise;Balance training;Neuromuscular re-education;Patient/family education;Manual techniques;Passive range of motion;Dry needling;Vestibular;Spinal Manipulations;Joint Manipulations    PT Next Visit Plan Review HEP, progress flexion-based exercises    PT Home Exercise Plan Access Code: [email or web address]           Patient will benefit from skilled therapeutic intervention in order to improve the following deficits and impairments:  Abnormal gait, Pain, Decreased strength  Visit Diagnosis: Chronic midline low back pain without sciatica - Plan: PT plan of care cert/re-cert  Muscle weakness (generalized) - Plan: PT plan of care cert/re-cert  Pain in right hip - Plan: PT plan of care cert/re-cert     Problem List Patient Active Problem List   Diagnosis Date Noted  . Low back pain 05/10/2020  . Right sided abdominal pain 05/10/2020  . Back pain 10/06/2019  . Lymphedema 01/23/2018  . Chronic venous insufficiency 01/23/2018  . Swelling of lower extremity 01/16/2018  . SOB (shortness of breath) on exertion 04/27/2017  . Hand lesion 08/21/2015  . Nasal congestion 08/21/2015  . Left breast mass 03/29/2015  . Abnormal mammogram of left breast 03/29/2015  . Health care maintenance 03/14/2015  . Obesity, morbid, BMI 50 or higher (HCC) 06/25/2014  . Foot pain, bilateral 06/25/2014  . Fatigue 01/25/2014  . Obstructive sleep apnea 01/25/2014  . Rash 08/09/2013  . Atrial fibrillation (HCC) 06/24/2013  . Microalbuminuria 06/24/2013  . 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 Low TSH level 12/22/2012  . Diabetes mellitus (HCC) 06/06/2012  . Hypertension 06/06/2012  . Hypercholesteremia  06/06/2012  . Urinary incontinence 06/06/2012  . GERD (gastroesophageal reflux disease) 06/06/2012    Lynnea MaizesJason D Venicia Vandall PT, DPT,  GCS  Nycere Presley 08/08/2020, 3:04 PM  [place] [facility] [address] [place], [place], [address] Phone: [phone]   Fax:  [phone]  Name: Christine Robertson MRN: [id number] Date of Birth: September 23, 1955

## 2020-08-12 ENCOUNTER — Other Ambulatory Visit: Payer: Self-pay | Admitting: Internal Medicine

## 2020-08-15 ENCOUNTER — Other Ambulatory Visit: Payer: Self-pay | Admitting: Internal Medicine

## 2020-08-17 ENCOUNTER — Other Ambulatory Visit: Payer: Self-pay

## 2020-08-17 ENCOUNTER — Ambulatory Visit: Payer: Medicare Other

## 2020-08-17 DIAGNOSIS — M545 Low back pain, unspecified: Secondary | ICD-10-CM | POA: Diagnosis not present

## 2020-08-17 DIAGNOSIS — M6281 Muscle weakness (generalized): Secondary | ICD-10-CM

## 2020-08-17 DIAGNOSIS — M25551 Pain in right hip: Secondary | ICD-10-CM

## 2020-08-17 NOTE — Therapy (Signed)
[place] [facility] [address] [place], [place], [address] Phone: [phone]   Fax:  [phone]  Physical Therapy Treatment  Patient Details  Name: Christine Robertson MRN: [id number] Date of Birth: [birth year] Referring Provider (PT): Dr. Lorin Picket   Encounter Date: 08/17/2020   PT End of Session - 08/17/20 1531    Visit Number 2    Number of Visits 17    Date for PT Re-Evaluation 10/03/20    Authorization Type eval: 08/08/20    PT Start Time 1527    PT Stop Time 1600    PT Time Calculation (min) 33 min    Activity Tolerance Patient tolerated treatment well    Behavior During Therapy [facility] for tasks assessed/performed           Past Medical History:  Diagnosis Date  . Abnormal results of thyroid function studies   . Allergy    hay fever  . Angina pectoris, unspecified (HCC)   . Arthritis   . Atrial fibrillation, unspecified   . Chicken pox   . Cold hands   . Congenital anomaly of adrenal gland   . Diabetes mellitus    type 2, uncomplicated  . Essential hypertension, benign   . GERD (gastroesophageal reflux disease)   . IBS (irritable bowel syndrome)   . Malaise   . Morbid obesity (HCC)   . Murmur   . Pain in soft tissues of limb   . Proteinuria   . Pure hypercholesterolemia   . Rash and other nonspecific skin eruption   . Sleep apnea    Obstructive; Uses C-Pap machine  . Urinary incontinence     Past Surgical History:  Procedure Laterality Date  . ABDOMINAL HYSTERECTOMY  2010  . BLADDER SURGERY  1967   stretching of bladder  . BREAST BIOPSY Left 03/28/2015   done at Dr. Rutherford Nail office. fibrocystic changes  . COLONOSCOPY    . COLONOSCOPY WITH PROPOFOL N/A 09/09/2015   Procedure: COLONOSCOPY WITH PROPOFOL;  Surgeon: Scot Jun, MD;  Location: [facility] ENDOSCOPY;  Service: Endoscopy;  Laterality: N/A;  . ESOPHAGOGASTRODUODENOSCOPY    . TONSILLECTOMY AND ADENOIDECTOMY  1959  . URETHRAL DILATION      There were no  vitals filed for this visit.   Subjective Assessment - 08/17/20 1531    Subjective Pt reports that she is doing well today. She denies any low back pain upon arrival in sitting. She also states that the R hip pain is improving and has no pain at rest upon arrival. No specific questions or concerns currently.    Pertinent History Pt reports back pain for multiple years, progressively worsening. Insidious onset without any known trauma or injury. Pain is midline and radiates out laterally on both sides. No pain in buttocks, thighs, or LEs. Pain is worse with standing and walking which is impeding her function. She has been having R sided abdominal pain which was hurting for 3 months however has improved over the last month. She had an abdominal CT which showed slight enlargement in left adrenal mass. 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 Small fat containing umbilical hernia. Chronic changes as described above without acute abnormality. Pt also had plain film lumbar imaging 05/10/20 which showed no fracture or spondylolisthesis. There is moderate disc space narrowing at L2-3, L3-4, L4-5, L5-S1. There are anterior and lateral osteophytes at all levels. There is facet arthropathy at L3-4, L4-5, L5-S1  bilaterally. Over the course of the last month she has also been having R groin pain when trying to flex R hip to get into or out of the car.    How long can you stand comfortably? 5 minutes    How long can you walk comfortably? 5 minutes    Diagnostic tests See history    Patient Stated Goals Decrease back pain, improve ability to clean house with less pain    Currently in Pain? No/denies              TREATMENT   Ther-ex Hooklying posterior pelvic tilts 3-5s hold 2 x 10; Hooklying marches LLE only 2 x 10, attempted RLE but too painful; Supine Single Knee to Chest Stretch 30s hold x 3 bilateral, pt able to assist with LLE but unable to assist  with RLE due to increase in pain; Modified Thomas Stretch for RLE 30s x 3; Hooklying lumbar rocking 30s x 2; Seated Lumbar Flexion Stretch pball forward rollout 10s hold x 10;   Pt educated throughout session about proper posture and technique with exercises. Improved exercise technique, movement at target joints, use of target muscles after min to mod verbal, visual, tactile cues.    Patient reports improvement in right hip pain however remains a significant limitation during PT session.  Reviewed HEP with patient and perform during session.  No HEP modifications provided on this date.  Patient continues report minimal to no low back pain in sitting or supine however does report pain in standing.  Significant concern remains for central canal stenosis.  Patient encouraged to continue HEP and follow-up as scheduled. Pt will benefit from PT services to address deficits in pain in order to return to full function at home.                           PT Short Term Goals - 08/08/20 1453      PT SHORT TERM GOAL #1   Title Pt will be independent with HEP in order to improve strength and decrease back pain in order to improve pain-free function at home.    Time 4    Period Weeks    Status New    Target Date 09/05/20             PT Long Term Goals - 08/08/20 1453      PT LONG TERM GOAL #1   Title Pt will decrease mODI score by at least 13 points in order demonstrate clinically significant reduction in back pain/disability.    Baseline 08/08/20: 46%    Time 8    Period Weeks    Status New    Target Date 10/03/20      PT LONG TERM GOAL #2   Title Pt will improve FOTO to at least 56 in order to demonstrate significant improvement in back pain and function    Baseline 08/08/20: 44    Time 8    Period Weeks    Status New    Target Date 10/03/20      PT LONG TERM GOAL #3   Title Pt will decrease worst back pain as reported on NPRS by at least 2 points in order to  demonstrate clinically significant reduction in back pain.    Baseline 08/08/20: 8/10    Time 8    Period Weeks    Status New    Target Date 10/03/20  Plan - 08/17/20 1532    Clinical Impression Statement Patient reports improvement in right hip pain however remains a significant limitation during PT session.  Reviewed HEP with patient and perform during session.  No HEP modifications provided on this date.  Patient continues report minimal to no low back pain in sitting or supine however does report pain in standing.  Significant concern remains for central canal stenosis.  Patient encouraged to continue HEP and follow-up as scheduled. Pt will benefit from PT services to address deficits in pain in order to return to full function at home.    Personal Factors and Comorbidities Age;Comorbidity 3+    Comorbidities Obesity, diabetes, hypertension    Examination-Activity Limitations Locomotion Level;Stairs;Stand    Examination-Participation Restrictions Community Activity;Laundry;Meal Prep;Cleaning    Stability/Clinical Decision Making Unstable/Unpredictable    Rehab Potential Fair    PT Frequency 2x / week    PT Duration 8 weeks    PT Treatment/Interventions ADLs/Self Care Home Management;Aquatic Therapy;Biofeedback;Canalith Repostioning;Cryotherapy;Electrical Stimulation;Iontophoresis 4mg /ml Dexamethasone;Moist Heat;Traction;Ultrasound;DME Instruction;Gait training;Stair training;Functional mobility training;Therapeutic activities;Therapeutic exercise;Balance training;Neuromuscular re-education;Patient/family education;Manual techniques;Passive range of motion;Dry needling;Vestibular;Spinal Manipulations;Joint Manipulations    PT Next Visit Plan Review HEP, progress flexion-based exercises    PT Home Exercise Plan Access Code: [facility]           Patient will benefit from skilled therapeutic intervention in order to improve the following deficits and impairments:   Abnormal gait,Pain,Decreased strength  Visit Diagnosis: Chronic midline low back pain without sciatica  Muscle weakness (generalized)  Pain in right hip     Problem List Patient Active Problem List   Diagnosis Date Noted  . Low back pain 05/10/2020  . Right sided abdominal pain 05/10/2020  . Back pain 10/06/2019  . Lymphedema 01/23/2018  . Chronic venous insufficiency 01/23/2018  . Swelling of lower extremity 01/16/2018  . SOB (shortness of breath) on exertion 04/27/2017  . Hand lesion 08/21/2015  . Nasal congestion 08/21/2015  . Left breast mass 03/29/2015  . Abnormal mammogram of left breast 03/29/2015  . Health care maintenance 03/14/2015  . Obesity, morbid, BMI 50 or higher (HCC) 06/25/2014  . Foot pain, bilateral 06/25/2014  . Fatigue 01/25/2014  . Obstructive sleep apnea 01/25/2014  . Rash 08/09/2013  . Atrial fibrillation (HCC) 06/24/2013  . Microalbuminuria 06/24/2013  . 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 Low TSH level 12/22/2012  . Diabetes mellitus (HCC) 06/06/2012  . Hypertension 06/06/2012  . Hypercholesteremia 06/06/2012  . Urinary incontinence 06/06/2012  . GERD (gastroesophageal reflux disease) 06/06/2012   08/06/2012 Kito Cuffe PT, DPT, GCS  Yaritzi Craun 08/17/2020, 4:13 PM  [place] [facility] [address]. [place], [place], [place] Phone: [phone]   Fax:  [phone]  Name: MCKAYLA MULCAHEY MRN: [name] Date of Birth: 26-Sep-1955

## 2020-08-18 ENCOUNTER — Ambulatory Visit: Payer: Medicare Other | Admitting: Pharmacist

## 2020-08-18 DIAGNOSIS — E7800 Pure hypercholesterolemia, unspecified: Secondary | ICD-10-CM

## 2020-08-18 DIAGNOSIS — I10 Essential (primary) hypertension: Secondary | ICD-10-CM

## 2020-08-18 DIAGNOSIS — I4891 Unspecified atrial fibrillation: Secondary | ICD-10-CM

## 2020-08-18 DIAGNOSIS — E1165 Type 2 diabetes mellitus with hyperglycemia: Secondary | ICD-10-CM

## 2020-08-18 NOTE — Chronic Care Management (AMB) (Signed)
Chronic Care Management   Pharmacy Note  08/18/2020 Name: Christine Robertson MRN: [id number] DOB: 10-15-1955  Subjective:  Christine Robertson is a 64 y.o. year old female who is a primary care patient of Dale [employer], MD. The CCM team was consulted for assistance with chronic disease management and care coordination needs.    Engaged with patient by telephone for follow up visit in response to provider referral for pharmacy case management and/or care coordination services.   Consent to Services:  [name] was given information about Chronic Care Management services, agreed to services, and gave verbal consent prior to initiation of services on 11/20/19. Please see initial visit note for detailed documentation.   Objective:  Lab Results  Component Value Date   CREATININE 0.68 05/10/2020   CREATININE 0.71 01/28/2020   CREATININE 0.73 10/13/2019    Lab Results  Component Value Date   HGBA1C 8.9 (H) 05/10/2020       Component Value Date/Time   CHOL 126 05/10/2020 1420   TRIG 200.0 (H) 05/10/2020 1420   HDL 51.20 05/10/2020 1420   CHOLHDL 2 05/10/2020 1420   VLDL 40.0 05/10/2020 1420   LDLCALC 35 05/10/2020 1420   LDLDIRECT 101.0 06/05/2018 0918     BP Readings from Last 3 Encounters:  08/03/20 126/72  07/14/20 128/75  05/10/20 136/86    Assessment/Interventions: Review of patient past medical history, allergies, medications, health status, including review of consultants reports, laboratory and other test data, was performed as part of comprehensive evaluation and provision of chronic care management services.   SDOH (Social Determinants of Health) assessments and interventions performed:  SDOH Interventions   Flowsheet Row Most Recent Value  SDOH Interventions   Financial Strain Interventions Other (Comment)  [does not qualify for patient assisatnce]       CCM Care Plan  Allergies  Allergen Reactions  . Erythromycin Hives  . Augmentin [Amoxicillin-Pot  Clavulanate] Hives  . Demerol [Meperidine] Nausea Only    Medications Reviewed Today    Reviewed by Lourena Simmonds, RPH-CPP (Pharmacist) on 08/18/20 at 1407  Med List Status: <None>  Medication Order Taking? Sig Documenting Provider Last Dose Status Informant  Ascorbic Acid (VITAMIN C) 500 MG CAPS [id number] Yes Take 500 mg by mouth daily. [provider] Taking Active   aspirin 81 MG tablet [id number] Yes Take 81 mg by mouth daily. [provider] Taking Active Self  beclomethasone (QVAR) 40 MCG/ACT inhaler [id number] No Inhale 1 puff into the lungs 2 (two) times daily as needed.   Patient not taking: No sig reported   [provider] Not Taking Active   cetirizine (ZYRTEC) 10 MG tablet [id number] Yes Take 10 mg by mouth daily. [provider] Taking Active Self  flecainide (TAMBOCOR) 100 MG tablet [id number] Yes TAKE 1 TABLET BY MOUTH TWICE DAILY [provider] Taking Active   hydrochlorothiazide (HYDRODIURIL) 25 MG tablet [id number] Yes TAKE ONE TABLET BY MOUTH DAILY Dale [place], MD Taking Active   lisinopril (ZESTRIL) 20 MG tablet [id number] Yes TAKE ONE TABLET BY MOUTH DAILY Dale [place], MD Taking Active   metFORMIN (GLUCOPHAGE) 1000 MG tablet [id number] Yes TAKE ONE TABLET BY MOUTH TWICE A DAY WITH MEALS Arnett, Lyn Records, FNP Taking Active   metoprolol tartrate (LOPRESSOR) 25 MG tablet [id number] Yes Take 25 mg by mouth 2 (two) times daily. Dalia Heading, MD Taking Active   Multiple Vitamin (MULTIVITAMIN) tablet [id number] Yes Take 1 tablet by mouth daily. [provider] Taking Active Self  omeprazole (PRILOSEC) 20 MG capsule [id number] Yes TAKE 1 CAPSULE BY MOUTH TWICE DAILY Dale [place], MD Taking Active            Med Note Tyrone Nine Nov 19, 2019 11:08 AM) Once daily   oxybutynin (DITROPAN) 5 MG tablet [id number] Yes TAKE ONE TABLET BY MOUTH TWICE A Donne Anon, MD Taking Active   OZEMPIC, 1 MG/DOSE, 4  MG/3ML Namon Cirri [id number] Yes DIAL AND INJECT UNDER THE SKIN 1 MG WEEKLY Dale [place], MD Taking Active   rosuvastatin (CRESTOR) 10 MG tablet [id number] Yes TAKE ONE TABLET BY MOUTH DAILY Dale [place], MD Taking Active   triamcinolone cream (KENALOG) 0.1 % [id number] No Apply 1 application topically 2 (two) times daily.  Patient not taking: Reported on 08/18/2020   Dale [place], MD Not Taking Active           Patient Active Problem List   Diagnosis Date Noted  . Low back pain 05/10/2020  . Right sided abdominal pain 05/10/2020  . Back pain 10/06/2019  . Lymphedema 01/23/2018  . Chronic venous insufficiency 01/23/2018  . Swelling of lower extremity 01/16/2018  . SOB (shortness of breath) on exertion 04/27/2017  . Hand lesion 08/21/2015  . Nasal congestion 08/21/2015  . Left breast mass 03/29/2015  . Abnormal mammogram of left breast 03/29/2015  . Health care maintenance 03/14/2015  . Obesity, morbid, BMI 50 or higher (HCC) 06/25/2014  . Foot pain, bilateral 06/25/2014  . Fatigue 01/25/2014  . Obstructive sleep apnea 01/25/2014  . Rash 08/09/2013  . Atrial fibrillation (HCC) 06/24/2013  . Microalbuminuria 06/24/2013  . 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 Low TSH level 12/22/2012  . Diabetes mellitus (HCC) 06/06/2012  . Hypertension 06/06/2012  . Hypercholesteremia 06/06/2012  . Urinary incontinence 06/06/2012  . GERD (gastroesophageal reflux disease) 06/06/2012    Conditions to be addressed/monitored per PCP order: HTN, HLD and DMII  Patient Care Plan: Medication Management    Problem Identified: Diabetes, HTN, AFib     Long-Range Goal: Disease Progression Prevention   This Visit's Progress: On track  Priority: High  Note:   Current Barriers:  . Unable to achieve control of diabetes   Pharmacist Clinical Goal(s):  Marland Kitchen Over the next 90 days, patient will verbalize ability to afford treatment regimen . Over the next 90 days, patient will achieve control of diabetes  as evidenced by A1c through collaboration with PharmD and provider.  Interventions: . 1:1 collaboration with Dale [place], MD regarding development and update of comprehensive plan of care as evidenced by provider attestation and co-signature . Inter-disciplinary care team collaboration (see longitudinal plan of care) . Comprehensive medication review performed; medication list updated in electronic medical record  Diabetes: . Uncontrolled; current treatment: metformin 1000 mg BID, Ozempic 1 mg weekly  . Current glucose readings: 120-160s (~130s usually); afternoon 140-160s . Current meal patterns: bBreakfast: oatmeal + egg; Lunch: 1/2 sandwich (lower sodium [place]); tomatoes; other vegetables; Supper: Grilled chicken, vegetables; vegetable beef soup; Desserts: avoiding; every couple of weeks will treat themselves to ice cream treat; Drinks: water, diet coke . Current exercise: limited by back pain . Next steps would be SGLT2 vs sulfonylurea. Unfortunately, patient is over income for assistance and is worried about adding an additional brand name medication.  . Discussed benefit of trial of CGM to evaluate impact of dietary and lifestyle choices. Patient interested. Appears her Android device does have the capabilities for [place] 2. Encouraged to download and set up a Lib[employer]  account. Scheduled for time with nurse when she is in office next month w/ PCP visit for education.  Hyperlipidemia and ASCVD risk reducation: . Controlled; current treatment: rosuvastatin 10 mg daily   . Recommended to continue current regimen at this time  Atrial Fibrillation with HTN: . Appropriately managed; current rhythm control: flecainide, current rate control: metoprolol tartrate 25 mg BID; anticoagulant treatment: none, Dr. Lady Gary elected to continue aspirin instead of DOAC; also on lisinopril 20 mg + HCTZ 25 mg for HTN . Patient has not been checking BP at home lately.  . Continue current regimen with  collaboration w/ cardiology at this time.   Patient Goals/Self-Care Activities . Over the next 90 days, patient will:  - take medications as prescribed check glucose TID using CGM, document, and provide at future appointments check blood pressure BID, document, and provide at future appointments  Follow Up Plan: Telephone follow up appointment with care management team member scheduled for: ~5 weeks      Medication Assistance: Patient is over income for assistance  Plan: Telephone follow up appointment with care management team member scheduled for: ~ 6 weeks  [name], PharmD, [place], CPP Clinical Pharmacist [employer] [facility] [employer] [phone]

## 2020-08-18 NOTE — Patient Instructions (Signed)
Visit Information  Patient Care Plan: Medication Management    Problem Identified: Diabetes, HTN, AFib     Long-Range Goal: Disease Progression Prevention   This Visit's Progress: On track  Priority: High  Note:   Current Barriers:  . Unable to achieve control of diabetes   Pharmacist Clinical Goal(s):  Marland Kitchen Over the next 90 days, patient will verbalize ability to afford treatment regimen . Over the next 90 days, patient will achieve control of diabetes as evidenced by A1c through collaboration with PharmD and provider.  Interventions: . 1:1 collaboration with Dale Shubham Thackston, MD regarding development and update of comprehensive plan of care as evidenced by provider attestation and co-signature . Inter-disciplinary care team collaboration (see longitudinal plan of care) . Comprehensive medication review performed; medication list updated in electronic medical record  Diabetes: . Uncontrolled; current treatment: metformin 1000 mg BID, Ozempic 1 mg weekly  . Current glucose readings: 120-160s (~130s usually); afternoon 140-160s . Current meal patterns: bBreakfast: oatmeal + egg; Lunch: 1/2 sandwich (lower sodium [place]); tomatoes; other vegetables; Supper: Grilled chicken, vegetables; vegetable beef soup; Desserts: avoiding; every couple of weeks will treat themselves to ice cream treat; Drinks: water, diet coke . Current exercise: limited by back pain . Next steps would be SGLT2 vs sulfonylurea. Unfortunately, patient is over income for assistance and is worried about adding an additional brand name medication.  . Discussed benefit of trial of CGM to evaluate impact of dietary and lifestyle choices. Patient interested. Appears her Android device does have the capabilities for [place] 2. Encouraged to download and set up a LibreView account. Scheduled for time with nurse when she is in office next month w/ PCP visit for education.  Hyperlipidemia and ASCVD risk reducation: . Controlled;  current treatment: rosuvastatin 10 mg daily   . Recommended to continue current regimen at this time  Atrial Fibrillation with HTN: . Appropriately managed; current rhythm control: flecainide, current rate control: metoprolol tartrate 25 mg BID; anticoagulant treatment: none, Dr. Lady Gary elected to continue aspirin instead of DOAC; also on lisinopril 20 mg + HCTZ 25 mg for HTN . Patient has not been checking BP at home lately.  . Continue current regimen with collaboration w/ cardiology at this time.   Patient Goals/Self-Care Activities . Over the next 90 days, patient will:  - take medications as prescribed check glucose TID using CGM, document, and provide at future appointments check blood pressure BID, document, and provide at future appointments  Follow Up Plan: Telephone follow up appointment with care management team member scheduled for: ~5 weeks       The patient verbalized understanding of instructions, educational materials, and care plan provided today and declined offer to receive copy of patient instructions, educational materials, and care plan.   Plan: Telephone follow up appointment with care management team member scheduled for: ~ 6 weeks  [name], PharmD, [place], CPP Clinical Pharmacist [employer] [facility] [employer] [phone]

## 2020-08-23 ENCOUNTER — Other Ambulatory Visit: Payer: Self-pay

## 2020-08-23 ENCOUNTER — Ambulatory Visit: Payer: Medicare Other

## 2020-08-23 DIAGNOSIS — M545 Low back pain, unspecified: Secondary | ICD-10-CM | POA: Diagnosis not present

## 2020-08-23 DIAGNOSIS — M6281 Muscle weakness (generalized): Secondary | ICD-10-CM

## 2020-08-23 DIAGNOSIS — M25551 Pain in right hip: Secondary | ICD-10-CM

## 2020-08-23 NOTE — Therapy (Signed)
[facility] [address]. [place], [place], [address] Phone: [phone]   Fax:  [phone]  Physical Therapy Treatment  Patient Details  Name: Christine Robertson MRN: [id number] Date of Birth: [birth year] Referring Provider (PT): Dr. Lorin Picket   Encounter Date: 08/23/2020   PT End of Session - 08/23/20 0941    Visit Number 3    Number of Visits 17    Date for PT Re-Evaluation 10/03/20    Authorization Type eval: 08/08/20    PT Start Time 0935    PT Stop Time 1020    PT Time Calculation (min) 45 min    Activity Tolerance Patient tolerated treatment well    Behavior During Therapy [facility] for tasks assessed/performed           Past Medical History:  Diagnosis Date   Abnormal results of thyroid function studies    Allergy    hay fever   Angina pectoris, unspecified (HCC)    Arthritis    Atrial fibrillation, unspecified    Chicken pox    Cold hands    Congenital anomaly of adrenal gland    Diabetes mellitus    type 2, uncomplicated   Essential hypertension, benign    GERD (gastroesophageal reflux disease)    IBS (irritable bowel syndrome)    Malaise    Morbid obesity (HCC)    Murmur    Pain in soft tissues of limb    Proteinuria    Pure hypercholesterolemia    Rash and other nonspecific skin eruption    Sleep apnea    Obstructive; Uses C-Pap machine   Urinary incontinence     Past Surgical History:  Procedure Laterality Date   ABDOMINAL HYSTERECTOMY  2010   BLADDER SURGERY  1967   stretching of bladder   BREAST BIOPSY Left 03/28/2015   done at Dr. Rutherford Nail office. fibrocystic changes   COLONOSCOPY     COLONOSCOPY WITH PROPOFOL N/A 09/09/2015   Procedure: COLONOSCOPY WITH PROPOFOL;  Surgeon: Scot Jun, MD;  [facility] ENDOSCOPY;  Service: Endoscopy;  Laterality: N/A;   ESOPHAGOGASTRODUODENOSCOPY     TONSILLECTOMY AND ADENOIDECTOMY  1959   URETHRAL DILATION      There were no  vitals filed for this visit.   Subjective Assessment - 08/23/20 0940    Subjective Pt reports that she is doing well today. She denies any low back pain upon arrival in sitting. She also states that the R hip pain is still present and bothersome although slightly better than at the initial evaluation. No specific questions or concerns currently.    Pertinent History Pt reports back pain for multiple years, progressively worsening. Insidious onset without any known trauma or injury. Pain is midline and radiates out laterally on both sides. No pain in buttocks, thighs, or LEs. Pain is worse with standing and walking which is impeding her function. She has been having R sided abdominal pain which was hurting for 3 months however has improved over the last month. She had an abdominal CT which showed slight enlargement in left adrenal mass. 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 Small fat containing umbilical hernia. Chronic changes as described above without acute abnormality. Pt also had plain film lumbar imaging 05/10/20 which showed no fracture or spondylolisthesis. There is moderate disc space narrowing at L2-3, L3-4, L4-5, L5-S1. There are anterior and lateral osteophytes at all levels. There is facet arthropathy at  L3-4, L4-5, L5-S1 bilaterally. Over the course of the last month she has also been having R groin pain when trying to flex R hip to get into or out of the car.    How long can you stand comfortably? 5 minutes    How long can you walk comfortably? 5 minutes    Diagnostic tests See history    Patient Stated Goals Decrease back pain, improve ability to clean house with less pain    Currently in Pain? No/denies              TREATMENT   Ther-ex Hooklying posterior pelvic tilts 3s hold x 40;; Hooklying marches LLE only x 10 BLE, additional 10 performed on LLE; Hooklying L SLR x 10; Hooklying lumbar rocking 30s x 2; Hooklying  isometric resisted lumbar rotation in hooklying with therapist providing resistance at knees 3s hold x 3 each direction however discontinued secondary to hip pain; Hooklying abdominal and lumbar strengthening with pt holding cane at 90 degrees and therapist providing superior and inferior resistance 3s hold x 10 each direction; Hooklying oblique strengthening with pt holding cane at 90 degrees and therapist providing right and left resistance 3s hold x 10 each direction; Seated lumbar flexion stretch pball rollout 10s hold x 10; Seated lumbar lateral  flexion stretch pball rollout 10s hold x 10 toward each side;   Manual Therapy  Supine Single Knee to Chest Stretch 30s hold bilateral; Hip FABER and FADIR stretches x 30s bilateral; Long axis hip distraction with belt assist x 30s bilateral, pt unable to tolerate additional bouts due to hip pain; Attempted hip medial to lateral distraction but unable to approximate close enough to joint line;   Pt educated throughout session about proper posture and technique with exercises. Improved exercise technique, movement at target joints, use of target muscles after min to mod verbal, visual, tactile cues.    Right hip pain remains a significant limitation during PT session.  Repeated lumbar and hip stretches today however it is difficult for patient to achieve an adequate stretch.  Continued with abdominal and lumbar strengthening as well in hooklying.  Attempted lumbar distraction through long axis distraction of the hip however patient struggles to tolerate so unable to perform more than one bout. No HEP modifications provided on this date.  Patient continues report minimal to no low back pain in sitting or supine however does report pain in standing.  Significant concern remains for central canal stenosis.  Patient encouraged to continue HEP and follow-up as scheduled. Pt will benefit from PT services to address deficits in pain in order to return to full  function at home.                                PT Short Term Goals - 08/08/20 1453      PT SHORT TERM GOAL #1   Title Pt will be independent with HEP in order to improve strength and decrease back pain in order to improve pain-free function at home.    Time 4    Period Weeks    Status New    Target Date 09/05/20             PT Long Term Goals - 08/08/20 1453      PT LONG TERM GOAL #1   Title Pt will decrease mODI score by at least 13 points in order demonstrate clinically significant reduction in back pain/disability.  Baseline 08/08/20: 46%    Time 8    Period Weeks    Status New    Target Date 10/03/20      PT LONG TERM GOAL #2   Title Pt will improve FOTO to at least 56 in order to demonstrate significant improvement in back pain and function    Baseline 08/08/20: 44    Time 8    Period Weeks    Status New    Target Date 10/03/20      PT LONG TERM GOAL #3   Title Pt will decrease worst back pain as reported on NPRS by at least 2 points in order to demonstrate clinically significant reduction in back pain.    Baseline 08/08/20: 8/10    Time 8    Period Weeks    Status New    Target Date 10/03/20                 Plan - 08/23/20 0942    Clinical Impression Statement Right hip pain remains a significant limitation during PT session.  Repeated lumbar and hip stretches today however it is difficult for patient to achieve an adequate stretch.  Continued with abdominal and lumbar strengthening as well in hooklying.  Attempted lumbar distraction through long axis distraction of the hip however patient struggles to tolerate so unable to perform more than one bout. No HEP modifications provided on this date.  Patient continues report minimal to no low back pain in sitting or supine however does report pain in standing.  Significant concern remains for central canal stenosis.  Patient encouraged to continue HEP and follow-up as scheduled. Pt  will benefit from PT services to address deficits in pain in order to return to full function at home.    Personal Factors and Comorbidities Age;Comorbidity 3+    Comorbidities Obesity, diabetes, hypertension    Examination-Activity Limitations Locomotion Level;Stairs;Stand    Examination-Participation Restrictions Community Activity;Laundry;Meal Prep;Cleaning    Stability/Clinical Decision Making Unstable/Unpredictable    Rehab Potential Fair    PT Frequency 2x / week    PT Duration 8 weeks    PT Treatment/Interventions ADLs/Self Care Home Management;Aquatic Therapy;Biofeedback;Canalith Repostioning;Cryotherapy;Electrical Stimulation;Iontophoresis 4mg /ml Dexamethasone;Moist Heat;Traction;Ultrasound;DME Instruction;Gait training;Stair training;Functional mobility training;Therapeutic activities;Therapeutic exercise;Balance training;Neuromuscular re-education;Patient/family education;Manual techniques;Passive range of motion;Dry needling;Vestibular;Spinal Manipulations;Joint Manipulations    PT Next Visit Plan Review HEP, progress flexion-based exercises    PT Home Exercise Plan Access Code: [email or web address]           Patient will benefit from skilled therapeutic intervention in order to improve the following deficits and impairments:  Abnormal gait,Pain,Decreased strength  Visit Diagnosis: Chronic midline low back pain without sciatica  Muscle weakness (generalized)  Pain in right hip     Problem List Patient Active Problem List   Diagnosis Date Noted   Low back pain 05/10/2020   Right sided abdominal pain 05/10/2020   Back pain 10/06/2019   Lymphedema 01/23/2018   Chronic venous insufficiency 01/23/2018   Swelling of lower extremity 01/16/2018   SOB (shortness of breath) on exertion 04/27/2017   Hand lesion 08/21/2015   Nasal congestion 08/21/2015   Left breast mass 03/29/2015   Abnormal mammogram of left breast 03/29/2015   Health care maintenance 03/14/2015    Obesity, morbid, BMI 50 or higher (HCC) 06/25/2014   Foot pain, bilateral 06/25/2014   Fatigue 01/25/2014   Obstructive sleep apnea 01/25/2014   Rash 08/09/2013   Atrial fibrillation (HCC) 06/24/2013   Microalbuminuria 06/24/2013   Adrenal gland anomaly  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 [place], [place], [address] Phone: [phone]   Fax:  [phone]  Name: TYLIN STRADLEY MRN: [id number] Date of Birth: 1955/10/30

## 2020-08-25 ENCOUNTER — Ambulatory Visit: Payer: Medicare Other

## 2020-08-25 ENCOUNTER — Other Ambulatory Visit: Payer: Self-pay

## 2020-08-25 DIAGNOSIS — M545 Low back pain, unspecified: Secondary | ICD-10-CM | POA: Diagnosis not present

## 2020-08-25 DIAGNOSIS — M6281 Muscle weakness (generalized): Secondary | ICD-10-CM

## 2020-08-25 NOTE — Therapy (Signed)
[facility] [address]. [place], [place], [address] Phone: [phone]   Fax:  [phone]  Physical Therapy Treatment  Patient Details  Name: Christine Robertson MRN: [id number] Date of Birth: [birth year] Referring Provider (PT): Dr. Lorin Picket   Encounter Date: 08/25/2020   PT End of Session - 08/25/20 0938    Visit Number 4    Number of Visits 17    Date for PT Re-Evaluation 10/03/20    Authorization Type eval: 08/08/20    PT Start Time 0933    PT Stop Time 1018    PT Time Calculation (min) 45 min    Activity Tolerance Patient tolerated treatment well    Behavior During Therapy [facility] for tasks assessed/performed           Past Medical History:  Diagnosis Date  . Abnormal results of thyroid function studies   . Allergy    hay fever  . Angina pectoris, unspecified (HCC)   . Arthritis   . Atrial fibrillation, unspecified   . Chicken pox   . Cold hands   . Congenital anomaly of adrenal gland   . Diabetes mellitus    type 2, uncomplicated  . Essential hypertension, benign   . GERD (gastroesophageal reflux disease)   . IBS (irritable bowel syndrome)   . Malaise   . Morbid obesity (HCC)   . Murmur   . Pain in soft tissues of limb   . Proteinuria   . Pure hypercholesterolemia   . Rash and other nonspecific skin eruption   . Sleep apnea    Obstructive; Uses C-Pap machine  . Urinary incontinence     Past Surgical History:  Procedure Laterality Date  . ABDOMINAL HYSTERECTOMY  2010  . BLADDER SURGERY  1967   stretching of bladder  . BREAST BIOPSY Left 03/28/2015   done at Dr. Rutherford [facility] office. fibrocystic changes  . COLONOSCOPY    . COLONOSCOPY WITH PROPOFOL N/A 09/09/2015   Procedure: COLONOSCOPY WITH PROPOFOL;  Surgeon: Scot Jun, MD;  Location: [facility] ENDOSCOPY;  Service: Endoscopy;  Laterality: N/A;  . ESOPHAGOGASTRODUODENOSCOPY    . TONSILLECTOMY AND ADENOIDECTOMY  1959  . URETHRAL DILATION      There were no  vitals filed for this visit.   Subjective Assessment - 08/25/20 0937    Subjective Pt reports that she is doing well today. She denies any low back or right hip pain upon arrival in sitting. No change in back pain since starting therapy. No specific questions or concerns currently.    Pertinent History Pt reports back pain for multiple years, progressively worsening. Insidious onset without any known trauma or injury. Pain is midline and radiates out laterally on both sides. No pain in buttocks, thighs, or LEs. Pain is worse with standing and walking which is impeding her function. She has been having R sided abdominal pain which was hurting for 3 months however has improved over the last month. She had an abdominal CT which showed slight enlargement in left adrenal mass. 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 Small fat containing umbilical hernia. Chronic changes as described above without acute abnormality. Pt also had plain film lumbar imaging 05/10/20 which showed no fracture or spondylolisthesis. There is moderate disc space narrowing at L2-3, L3-4, L4-5, L5-S1. There are anterior and lateral osteophytes at all levels. There is facet arthropathy at L3-4, L4-5, L5-S1 bilaterally. Over the course of the last  month she has also been having R groin pain when trying to flex R hip to get into or out of the car.    How long can you stand comfortably? 5 minutes    How long can you walk comfortably? 5 minutes    Diagnostic tests See history    Patient Stated Goals Decrease back pain, improve ability to clean house with less pain    Currently in Pain? No/denies             TREATMENT   Ther-ex Hooklying posterior pelvic tilts 5s hold x 20;; Hooklying marches LLE only x 10, attempted RLE however too painful; Hooklying lumbar rocking x 60s; Hooklying hip fallouts with coordinated breathing 2 x 10 BLE; Hooklying clams with manual resistance  from therapist 2 x 10 BLE; Hooklying adductor squeeze with manual resistance from therapist 2 x 10 BLE; Supine straight leg hip abduction/adduction with manual resistance from therapist x 10 each LLE, x 7 each RLE but discontinued secondary to pain; Hooklying isometric resisted lumbar rotation in hooklying with therapist providing resistance at knees 3s hold x 10 each direction, minimal pain reported today; Hooklying abdominal and lumbar strengthening with pt holding cane at 90 degrees and therapist providing superior and inferior resistance 3s hold x 10 each direction; Hooklying oblique strengthening with pt holding cane at 90 degrees and therapist providing right and left resistance 3s hold x 10 each direction;  Seated lumbar flexion stretch pball rollout 10s hold x 10; Seated lumbar lateral  flexion stretch pball rollout 10s hold x 10 toward each side;   Manual Therapy  Supine Single Knee to Chest Stretch 30s hold bilateral; Hip FABER and FADIR stretches x 30s bilateral; Long axis hip distraction with hands only 30s x 3 bilateral, pt reports she is able to feel the force through the low back;   Pt educated throughout session about proper posture and technique with exercises. Improved exercise technique, movement at target joints, use of target muscles after min to mod verbal, visual, tactile cues.    Right hip pain remains a significant limitation during PT session however is not as limiting today compared to previous sessions..  Repeated lumbar and hip stretches today however it is difficult for patient to achieve an adequate stretch. Continued with abdominal and lumbar strengthening as well in hooklying.    She is able to tolerate long axis hip distraction and reports that she can feel force through her low back.  No HEP modifications provided on this date.  Patient continues report minimal to no low back pain in sitting or supine however does report pain in standing.  Patient encouraged  to continue HEP and follow-up as scheduled. Pt will benefit from PT services to address deficits in pain in order to return to full function at home.                             PT Short Term Goals - 08/08/20 1453      PT SHORT TERM GOAL #1   Title Pt will be independent with HEP in order to improve strength and decrease back pain in order to improve pain-free function at home.    Time 4    Period Weeks    Status New    Target Date 09/05/20             PT Long Term Goals - 08/08/20 1453      PT LONG TERM  GOAL #1   Title Pt will decrease mODI score by at least 13 points in order demonstrate clinically significant reduction in back pain/disability.    Baseline 08/08/20: 46%    Time 8    Period Weeks    Status New    Target Date 10/03/20      PT LONG TERM GOAL #2   Title Pt will improve FOTO to at least 56 in order to demonstrate significant improvement in back pain and function    Baseline 08/08/20: 44    Time 8    Period Weeks    Status New    Target Date 10/03/20      PT LONG TERM GOAL #3   Title Pt will decrease worst back pain as reported on NPRS by at least 2 points in order to demonstrate clinically significant reduction in back pain.    Baseline 08/08/20: 8/10    Time 8    Period Weeks    Status New    Target Date 10/03/20                 Plan - 08/25/20 0939    Clinical Impression Statement Right hip pain remains a significant limitation during PT session however is not as limiting today compared to previous sessions..  Repeated lumbar and hip stretches today however it is difficult for patient to achieve an adequate stretch. Continued with abdominal and lumbar strengthening as well in hooklying.    She is able to tolerate long axis hip distraction and reports that she can feel force through her low back.  No HEP modifications provided on this date.  Patient continues report minimal to no low back pain in sitting or supine however does  report pain in standing.  Patient encouraged to continue HEP and follow-up as scheduled. Pt will benefit from PT services to address deficits in pain in order to return to full function at home.    Personal Factors and Comorbidities Age;Comorbidity 3+    Comorbidities Obesity, diabetes, hypertension    Examination-Activity Limitations Locomotion Level;Stairs;Stand    Examination-Participation Restrictions Community Activity;Laundry;Meal Prep;Cleaning    Stability/Clinical Decision Making Unstable/Unpredictable    Rehab Potential Fair    PT Frequency 2x / week    PT Duration 8 weeks    PT Treatment/Interventions ADLs/Self Care Home Management;Aquatic Therapy;Biofeedback;Canalith Repostioning;Cryotherapy;Electrical Stimulation;Iontophoresis 4mg /ml Dexamethasone;Moist Heat;Traction;Ultrasound;DME Instruction;Gait training;Stair training;Functional mobility training;Therapeutic activities;Therapeutic exercise;Balance training;Neuromuscular re-education;Patient/family education;Manual techniques;Passive range of motion;Dry needling;Vestibular;Spinal Manipulations;Joint Manipulations    PT Next Visit Plan Review HEP, progress flexion-based exercises    PT Home Exercise Plan [email or web address]           Patient will benefit from skilled therapeutic intervention in order to improve the following deficits and impairments:  Abnormal gait,Pain,Decreased strength  Visit Diagnosis: Chronic midline low back pain without sciatica  Muscle weakness (generalized)     Problem List Patient Active Problem List   Diagnosis Date Noted  . Low back pain 05/10/2020  . Right sided abdominal pain 05/10/2020  . Back pain 10/06/2019  . Lymphedema 01/23/2018  . Chronic venous insufficiency 01/23/2018  . Swelling of lower extremity 01/16/2018  . SOB (shortness of breath) on exertion 04/27/2017  . Hand lesion 08/21/2015  . Nasal congestion 08/21/2015  . Left breast mass 03/29/2015  . Abnormal mammogram of  left breast 03/29/2015  . Health care maintenance 03/14/2015  . Obesity, morbid, BMI 50 or higher (HCC) 06/25/2014  . Foot pain, bilateral 06/25/2014  . Fatigue 01/25/2014  .  Obstructive sleep apnea 01/25/2014  . Rash 08/09/2013  . Atrial fibrillation (HCC) 06/24/2013  . Microalbuminuria 06/24/2013  . 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 Low TSH level 12/22/2012  . Diabetes mellitus (HCC) 06/06/2012  . Hypertension 06/06/2012  . Hypercholesteremia 06/06/2012  . Urinary incontinence 06/06/2012  . GERD (gastroesophageal reflux disease) 06/06/2012   Sharalyn Ink Terrilee Dudzik PT, DPT, GCS  Darriel Utter 08/25/2020, 10:34 AM  [facility] [address]. [place], [place], [address] Phone: [phone]   Fax:  [phone]  Name: Christine Robertson MRN: [id number] Date of Birth: [birth year]

## 2020-08-27 ENCOUNTER — Other Ambulatory Visit: Payer: Self-pay | Admitting: Internal Medicine

## 2020-08-30 ENCOUNTER — Other Ambulatory Visit: Payer: Self-pay

## 2020-08-30 ENCOUNTER — Ambulatory Visit: Payer: Medicare Other

## 2020-08-30 DIAGNOSIS — M6281 Muscle weakness (generalized): Secondary | ICD-10-CM

## 2020-08-30 DIAGNOSIS — M25551 Pain in right hip: Secondary | ICD-10-CM

## 2020-08-30 DIAGNOSIS — G8929 Other chronic pain: Secondary | ICD-10-CM

## 2020-08-30 DIAGNOSIS — M545 Low back pain, unspecified: Secondary | ICD-10-CM | POA: Diagnosis not present

## 2020-08-30 NOTE — Therapy (Signed)
[facility] [address] [place], [place], [address] Phone: [phone]   Fax:  [phone]  Physical Therapy Treatment  Patient Details  Name: Christine Robertson MRN: [id number] Date of Birth: [birth year] Referring Provider (PT): Dr. Lorin Picket   Encounter Date: 08/30/2020   PT End of Session - 08/30/20 0941    Visit Number 5    Number of Visits 17    Date for PT Re-Evaluation 10/03/20    Authorization Type eval: 08/08/20    PT Start Time 0935    PT Stop Time 1015    PT Time Calculation (min) 40 min    Activity Tolerance Patient tolerated treatment well    Behavior During Therapy [facility] for tasks assessed/performed           Past Medical History:  Diagnosis Date   Abnormal results of thyroid function studies    Allergy    hay fever   Angina pectoris, unspecified (HCC)    Arthritis    Atrial fibrillation, unspecified    Chicken pox    Cold hands    Congenital anomaly of adrenal gland    Diabetes mellitus    type 2, uncomplicated   Essential hypertension, benign    GERD (gastroesophageal reflux disease)    IBS (irritable bowel syndrome)    Malaise    Morbid obesity (HCC)    Murmur    Pain in soft tissues of limb    Proteinuria    Pure hypercholesterolemia    Rash and other nonspecific skin eruption    Sleep apnea    Obstructive; Uses C-Pap machine   Urinary incontinence     Past Surgical History:  Procedure Laterality Date   ABDOMINAL HYSTERECTOMY  2010   BLADDER SURGERY  1967   stretching of bladder   BREAST BIOPSY Left 03/28/2015   done at Dr. Rutherford Nail office. fibrocystic changes   COLONOSCOPY     COLONOSCOPY WITH PROPOFOL N/A 09/09/2015   Procedure: COLONOSCOPY WITH PROPOFOL;  Surgeon: Scot Jun, MD;  Location: [facility] ENDOSCOPY;  Service: Endoscopy;  Laterality: N/A;   ESOPHAGOGASTRODUODENOSCOPY     TONSILLECTOMY AND ADENOIDECTOMY  1959   URETHRAL DILATION      There were no  vitals filed for this visit.   Subjective Assessment - 08/30/20 0939    Subjective Pt reports that she noticed some back pain this morning when getting out of the shower. She currently rates it as 4/10. She feels like her R hip is improving slightly. No specific questions or concerns currently.    Pertinent History Pt reports back pain for multiple years, progressively worsening. Insidious onset without any known trauma or injury. Pain is midline and radiates out laterally on both sides. No pain in buttocks, thighs, or LEs. Pain is worse with standing and walking which is impeding her function. She has been having R sided abdominal pain which was hurting for 3 months however has improved over the last month. She had an abdominal CT which showed slight enlargement in left adrenal mass. 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 Small fat containing umbilical hernia. Chronic changes as described above without acute abnormality. Pt also had plain film lumbar imaging 05/10/20 which showed no fracture or spondylolisthesis. There is moderate disc space narrowing at L2-3, L3-4, L4-5, L5-S1. There are anterior and lateral osteophytes at all levels. There is facet arthropathy at L3-4, L4-5, L5-S1 bilaterally. Over the course of  the last month she has also been having R groin pain when trying to flex R hip to get into or out of the car.    How long can you stand comfortably? 5 minutes    How long can you walk comfortably? 5 minutes    Diagnostic tests See history    Patient Stated Goals Decrease back pain, improve ability to clean house with less pain    Currently in Pain? Yes    Pain Score 4     Pain Location Back    Pain Orientation Lower    Pain Descriptors / Indicators Aching    Pain Type Chronic pain    Pain Onset More than a month ago    Pain Frequency Intermittent             TREATMENT   Ther-ex NuStep L2 x 5 minutes for warm-up with moist  heat on back (seat 10, arms 9), 2 minutes unbilled; Hooklying posterior pelvic tilts 5s hold 2 x 20; Hooklying marches LLE only x 15, x 10 RLE; attempted RLE however too painful; Hooklying lumbar rocking x 10 each direction; Hooklying hip fallouts with coordinated breathing x 15 BLE; Hooklying clams with manual resistance from therapist x 20 BLE; Hooklying adductor squeeze with manual resistance from therapist x 20 BLE; Supine SLR x 10 LLE, cues to maintain lumbar neutral; Supine straight leg hip abduction/adduction with manual resistance from therapist x 15 each, no pain reported; Hooklying isometric resisted lumbar rotation in hooklying with therapist providing resistance at knees 3s hold x 10 each direction, minimal pain reported today; Hooklying partial bridges 5s hold x 10; Seated opposite arm knee isometric hip flexion/abdominal contraction x 10 bilateral (added to HEP);   Manual Therapy Supine single knee to chest stretch 30s hold bilateral; Hip FABER and FADIR stretches x 30s bilateral; Supine HS stretch 30s hold bilateral; Long axis hip distraction with hands only 30s x 3 bilateral, pt reports she is able to feel the force through the low back;   Pt educated throughout session about proper posture and technique with exercises. Improved exercise technique, movement at target joints, use of target muscles after min to mod verbal, visual, tactile cues.   Right hip pain is significantly improved compared to previous sessions. She is able to perform hooklying marches without increase in R hip pain. Repeated lumbar and hip stretches today with improved ability of patient to localize stretch. Continued with abdominal and lumbar strengthening as well in hooklying.Added seated isometric opposite arm to knee marches/abdominal contractions to program today as well as HEP.Patient encouraged to continue HEP and follow-up as scheduled. Pt will benefit from PT services to address  deficits in pain in order to return to full function at home.                            PT Short Term Goals - 08/08/20 1453      PT SHORT TERM GOAL #1   Title Pt will be independent with HEP in order to improve strength and decrease back pain in order to improve pain-free function at home.    Time 4    Period Weeks    Status New    Target Date 09/05/20             PT Long Term Goals - 08/08/20 1453      PT LONG TERM GOAL #1   Title Pt will decrease mODI score by at  least 13 points in order demonstrate clinically significant reduction in back pain/disability.    Baseline 08/08/20: 46%    Time 8    Period Weeks    Status New    Target Date 10/03/20      PT LONG TERM GOAL #2   Title Pt will improve FOTO to at least 56 in order to demonstrate significant improvement in back pain and function    Baseline 08/08/20: 44    Time 8    Period Weeks    Status New    Target Date 10/03/20      PT LONG TERM GOAL #3   Title Pt will decrease worst back pain as reported on NPRS by at least 2 points in order to demonstrate clinically significant reduction in back pain.    Baseline 08/08/20: 8/10    Time 8    Period Weeks    Status New    Target Date 10/03/20                 Plan - 08/30/20 0941    Clinical Impression Statement Right hip pain is significantly improved compared to previous sessions. She is able to perform hooklying marches without increase in R hip pain. Repeated lumbar and hip stretches today with improved ability of patient to localize stretch. Continued with abdominal and lumbar strengthening as well in hooklying.  Added seated isometric opposite arm to knee marches/abdominal contractions to program today as well as HEP. Patient encouraged to continue HEP and follow-up as scheduled. Pt will benefit from PT services to address deficits in pain in order to return to full function at home.    Personal Factors and Comorbidities Age;Comorbidity 3+     Comorbidities Obesity, diabetes, hypertension    Examination-Activity Limitations Locomotion Level;Stairs;Stand    Examination-Participation Restrictions Community Activity;Laundry;Meal Prep;Cleaning    Stability/Clinical Decision Making Unstable/Unpredictable    Rehab Potential Fair    PT Frequency 2x / week    PT Duration 8 weeks    PT Treatment/Interventions ADLs/Self Care Home Management;Aquatic Therapy;Biofeedback;Canalith Repostioning;Cryotherapy;Electrical Stimulation;Iontophoresis 4mg /ml Dexamethasone;Moist Heat;Traction;Ultrasound;DME Instruction;Gait training;Stair training;Functional mobility training;Therapeutic activities;Therapeutic exercise;Balance training;Neuromuscular re-education;Patient/family education;Manual techniques;Passive range of motion;Dry needling;Vestibular;Spinal Manipulations;Joint Manipulations    PT Next Visit Plan Review HEP, progress flexion-based exercises    PT Home Exercise Plan Access Code: [facility]           Patient will benefit from skilled therapeutic intervention in order to improve the following deficits and impairments:  Abnormal gait,Pain,Decreased strength  Visit Diagnosis: Chronic midline low back pain without sciatica  Muscle weakness (generalized)  Pain in right hip     Problem List Patient Active Problem List   Diagnosis Date Noted   Low back pain 05/10/2020   Right sided abdominal pain 05/10/2020   Back pain 10/06/2019   Lymphedema 01/23/2018   Chronic venous insufficiency 01/23/2018   Swelling of lower extremity 01/16/2018   SOB (shortness of breath) on exertion 04/27/2017   Hand lesion 08/21/2015   Nasal congestion 08/21/2015   Left breast mass 03/29/2015   Abnormal mammogram of left breast 03/29/2015   Health care maintenance 03/14/2015   Obesity, morbid, BMI 50 or higher (HCC) 06/25/2014   Foot pain, bilateral 06/25/2014   Fatigue 01/25/2014   Obstructive sleep apnea 01/25/2014   Rash  08/09/2013   Atrial fibrillation (HCC) 06/24/2013   Microalbuminuria 06/24/2013   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   Low TSH level 12/22/2012   Diabetes mellitus (HCC) 06/06/2012   Hypertension 06/06/2012   Hypercholesteremia  06/06/2012   Urinary incontinence 06/06/2012   GERD (gastroesophageal reflux disease) 06/06/2012   Sharalyn InkJason D Shantella Blubaugh PT, DPT, GCS  Capitola Ladson 08/30/2020, 1:39 PM  [facility] [address] [place], [place], [id number] Phone: [phone]   Fax:  [phone]  Name: Christine Mirzaamela C Robertson MRN: [id number] Date of Birth: 02/06/1956

## 2020-09-01 ENCOUNTER — Ambulatory Visit: Payer: Medicare Other

## 2020-09-05 ENCOUNTER — Ambulatory Visit: Payer: Medicare Other

## 2020-09-12 ENCOUNTER — Other Ambulatory Visit: Payer: Self-pay

## 2020-09-12 ENCOUNTER — Telehealth: Payer: Self-pay | Admitting: Internal Medicine

## 2020-09-12 ENCOUNTER — Ambulatory Visit: Payer: Medicare Other | Attending: Internal Medicine

## 2020-09-12 DIAGNOSIS — M545 Low back pain, unspecified: Secondary | ICD-10-CM | POA: Diagnosis not present

## 2020-09-12 DIAGNOSIS — M6281 Muscle weakness (generalized): Secondary | ICD-10-CM | POA: Insufficient documentation

## 2020-09-12 DIAGNOSIS — G8929 Other chronic pain: Secondary | ICD-10-CM | POA: Insufficient documentation

## 2020-09-12 NOTE — Therapy (Signed)
[facility] [address] [place], [place], [address] Phone: [phone]   Fax:  [phone]  Physical Therapy Treatment  Patient Details  Name: Christine Robertson MRN: [id number] Date of Birth: 07-28-1956 Referring Provider (PT): Dr. Lorin Picket   Encounter Date: 09/12/2020   PT End of Session - 09/12/20 1402    Visit Number 6    Number of Visits 17    Date for PT Re-Evaluation 10/03/20    Authorization Type eval: 08/08/20    PT Start Time 1400    PT Stop Time 1430    PT Time Calculation (min) 30 min    Activity Tolerance Patient tolerated treatment well    Behavior During Therapy [facility] for tasks assessed/performed           Past Medical History:  Diagnosis Date  . Abnormal results of thyroid function studies   . Allergy    hay fever  . Angina pectoris, unspecified (HCC)   . Arthritis   . Atrial fibrillation, unspecified   . Chicken pox   . Cold hands   . Congenital anomaly of adrenal gland   . Diabetes mellitus    type 2, uncomplicated  . Essential hypertension, benign   . GERD (gastroesophageal reflux disease)   . IBS (irritable bowel syndrome)   . Malaise   . Morbid obesity (HCC)   . Murmur   . Pain in soft tissues of limb   . Proteinuria   . Pure hypercholesterolemia   . Rash and other nonspecific skin eruption   . Sleep apnea    Obstructive; Uses C-Pap machine  . Urinary incontinence     Past Surgical History:  Procedure Laterality Date  . ABDOMINAL HYSTERECTOMY  2010  . BLADDER SURGERY  1967   stretching of bladder  . BREAST BIOPSY Left 03/28/2015   done at Dr. Rutherford Nail office. fibrocystic changes  . COLONOSCOPY    . COLONOSCOPY WITH PROPOFOL N/A 09/09/2015   Procedure: COLONOSCOPY WITH PROPOFOL;  Surgeon: Scot Jun, MD;  Location: [facility] ENDOSCOPY;  Service: Endoscopy;  Laterality: N/A;  . ESOPHAGOGASTRODUODENOSCOPY    . TONSILLECTOMY AND ADENOIDECTOMY  1959  . URETHRAL DILATION      There were no  vitals filed for this visit.   Subjective Assessment - 09/12/20 1359    Subjective Pt reports that she is doing alright today. She currently rates her low back pain as 2/10. She feels like her R hip is continuing to improve. No specific questions or concerns currently.    Pertinent History Pt reports back pain for multiple years, progressively worsening. Insidious onset without any known trauma or injury. Pain is midline and radiates out laterally on both sides. No pain in buttocks, thighs, or LEs. Pain is worse with standing and walking which is impeding her function. She has been having R sided abdominal pain which was hurting for 3 months however has improved over the last month. She had an abdominal CT which showed slight enlargement in left adrenal mass. 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 Small fat containing umbilical hernia. Chronic changes as described above without acute abnormality. Pt also had plain film lumbar imaging 05/10/20 which showed no fracture or spondylolisthesis. There is moderate disc space narrowing at L2-3, L3-4, L4-5, L5-S1. There are anterior and lateral osteophytes at all levels. There is facet arthropathy at L3-4, L4-5, L5-S1 bilaterally. Over the course of the last month she  has also been having R groin pain when trying to flex R hip to get into or out of the car.    How long can you stand comfortably? 5 minutes    How long can you walk comfortably? 5 minutes    Diagnostic tests See history    Patient Stated Goals Decrease back pain, improve ability to clean house with less pain    Currently in Pain? Yes    Pain Score 2     Pain Location Back    Pain Orientation Lower    Pain Descriptors / Indicators Aching    Pain Type Chronic pain    Pain Onset More than a month ago    Pain Frequency Intermittent              TREATMENT   Ther-ex NuStep L2 x 5 minutes for warm-up with moist heat on back (seat 10,  arms 9), 2 minutes unbilled; Hooklying lumbar rockingx 10 each direction; Hooklying marches x 10 BLE, 3/10 R hip pain; Hooklying anterior and posterior pelvic tilts5s hold 2 x20; Hooklying hip fallouts with coordinated breathing x 15 BLE; Hooklying clams with manual resistance from therapist x 20 BLE; Hooklying adductor squeeze with manual resistance from therapist x 20 BLE; Supine SLR x 10 LLE, cues to maintain lumbar neutral; Supine straight leg hip abduction/adduction with manual resistance from therapist x 15 each, no pain reported; Hooklying isometric resisted lumbar rotation in hooklying with therapist providing resistance at knees 3s hold x10 each direction, minimal pain reported today; Hooklying partial bridges 5s hold x 10; Seated opposite arm knee isometric hip flexion/abdominal contraction 5s hold x 10 bilateral; Seated cane rows and chest press with therapist providing resistance x 10 each; Seated cane isometric flexion/extension with pt holding cane at 90 flexion and therapist providing superior and inferior resistance 3s hold x 10 each; Seated cane isometric rotation with pt holding cane at 90 flexion and therapist providing right and left resistance 3s hold x 10 each; Seated pball lateral roll-outs 5s hold x 10 to each side;   Pt educated throughout session about proper posture and technique with exercises. Improved exercise technique, movement at target joints, use of target muscles after min to mod verbal, visual, tactile cues.   Pt arrived late for her session so appointment was abbreviated accordingly. Right hip pain continues to improve with each passing session. She is able to perform hooklying marches with only mild increase in R hip pain today. Continued with abdominal and lumbar strengthening as well in hooklying and sitting. Discussed the possible need for further imaging of lumbar spine if symptoms do not improve over the next few visits. Patient encouraged to  continue HEP and follow-up as scheduled. Pt will benefit from PT services to address deficits in pain in order to return to full function at home.                           PT Short Term Goals - 08/08/20 1453      PT SHORT TERM GOAL #1   Title Pt will be independent with HEP in order to improve strength and decrease back pain in order to improve pain-free function at home.    Time 4    Period Weeks    Status New    Target Date 09/05/20             PT Long Term Goals - 08/08/20 1453  PT LONG TERM GOAL #1   Title Pt will decrease mODI score by at least 13 points in order demonstrate clinically significant reduction in back pain/disability.    Baseline 08/08/20: 46%    Time 8    Period Weeks    Status New    Target Date 10/03/20      PT LONG TERM GOAL #2   Title Pt will improve FOTO to at least 56 in order to demonstrate significant improvement in back pain and function    Baseline 08/08/20: 44    Time 8    Period Weeks    Status New    Target Date 10/03/20      PT LONG TERM GOAL #3   Title Pt will decrease worst back pain as reported on NPRS by at least 2 points in order to demonstrate clinically significant reduction in back pain.    Baseline 08/08/20: 8/10    Time 8    Period Weeks    Status New    Target Date 10/03/20                 Plan - 09/12/20 1402    Clinical Impression Statement Pt arrived late for her session so appointment was abbreviated accordingly. Right hip pain continues to improve with each passing session. She is able to perform hooklying marches with only mild increase in R hip pain today. Continued with abdominal and lumbar strengthening as well in hooklying and sitting.  Discussed the possible need for further imaging of lumbar spine if symptoms do not improve over the next few visits. Patient encouraged to continue HEP and follow-up as scheduled. Pt will benefit from PT services to address deficits in pain in order to  return to full function at home.    Personal Factors and Comorbidities Age;Comorbidity 3+    Comorbidities Obesity, diabetes, hypertension    Examination-Activity Limitations Locomotion Level;Stairs;Stand    Examination-Participation Restrictions Community Activity;Laundry;Meal Prep;Cleaning    Stability/Clinical Decision Making Unstable/Unpredictable    Rehab Potential Fair    PT Frequency 2x / week    PT Duration 8 weeks    PT Treatment/Interventions ADLs/Self Care Home Management;Aquatic Therapy;Biofeedback;Canalith Repostioning;Cryotherapy;Electrical Stimulation;Iontophoresis 4mg /ml Dexamethasone;Moist Heat;Traction;Ultrasound;DME Instruction;Gait training;Stair training;Functional mobility training;Therapeutic activities;Therapeutic exercise;Balance training;Neuromuscular re-education;Patient/family education;Manual techniques;Passive range of motion;Dry needling;Vestibular;Spinal Manipulations;Joint Manipulations    PT Next Visit Plan Review HEP, progress flexion-based exercises    PT Home Exercise Plan Access Code: [facility]           Patient will benefit from skilled therapeutic intervention in order to improve the following deficits and impairments:  Abnormal gait,Pain,Decreased strength  Visit Diagnosis: Chronic midline low back pain without sciatica  Muscle weakness (generalized)     Problem List Patient Active Problem List   Diagnosis Date Noted  . Low back pain 05/10/2020  . Right sided abdominal pain 05/10/2020  . Back pain 10/06/2019  . Lymphedema 01/23/2018  . Chronic venous insufficiency 01/23/2018  . Swelling of lower extremity 01/16/2018  . SOB (shortness of breath) on exertion 04/27/2017  . Hand lesion 08/21/2015  . Nasal congestion 08/21/2015  . Left breast mass 03/29/2015  . Abnormal mammogram of left breast 03/29/2015  . Health care maintenance 03/14/2015  . Obesity, morbid, BMI 50 or higher (HCC) 06/25/2014  . Foot pain, bilateral 06/25/2014  .  Fatigue 01/25/2014  . Obstructive sleep apnea 01/25/2014  . Rash 08/09/2013  . Atrial fibrillation (HCC) 06/24/2013  . Microalbuminuria 06/24/2013  . 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 Low  TSH level 12/22/2012  . Diabetes mellitus (HCC) 06/06/2012  . Hypertension 06/06/2012  . Hypercholesteremia 06/06/2012  . Urinary incontinence 06/06/2012  . GERD (gastroesophageal reflux disease) 06/06/2012   Sharalyn InkJason D Courtney Bellizzi PT, DPT, GCS  Raef Sprigg 09/12/2020, 2:38 PM  [facility] [address] [place], [place], [id number] Phone: [phone]   Fax:  [phone]  Name: Christine Robertson MRN: [id number] Date of Birth: 11/16/1955

## 2020-09-12 NOTE — Telephone Encounter (Signed)
Rejection Reason - Patient did not respond - Patient has not responded to attempts to reschedule appointment. Please verify that pt does desire an appointment with endocrinology. If so, please place a new referral." [facility] said on Sep 12, 2020 3:07 PM  "Patient no-showed appt with Dr. Gershon Crane on 09/01/2020. A letter has been mailed requesting that pt contact office to reschedule appt." [facility] said on Sep 06, 2020 10:34 AM

## 2020-09-13 NOTE — Patient Instructions (Addendum)
Access Code: [facility] URL: [email or web address] Date: 09/14/2020 Prepared by: Ria Comment  Exercises Supine Posterior Pelvic Tilt - 2 x daily - 7 x weekly - 2 sets - 10 reps - 3s hold Supine Single Knee to Chest Stretch - 2 x daily - 7 x weekly - 3 reps - 30s hold Seated Lumbar Flexion Stretch - 2 x daily - 7 x weekly - 3 reps - 30s hold Modified Thomas Stretch - 2 x daily - 7 x weekly - 3 reps - 30s hold Sit to Stand without Arm Support - 1 x daily - 7 x weekly - 2 sets - 10 reps Clamshell - 1 x daily - 7 x weekly - 2 sets - 10 reps - 3s hold Sidelying Reverse Clamshell - 1 x daily - 7 x weekly - 2 sets - 10 reps - 3s hold

## 2020-09-14 ENCOUNTER — Ambulatory Visit: Payer: Medicare Other

## 2020-09-14 ENCOUNTER — Other Ambulatory Visit: Payer: Self-pay

## 2020-09-14 DIAGNOSIS — M545 Low back pain, unspecified: Secondary | ICD-10-CM

## 2020-09-14 DIAGNOSIS — G8929 Other chronic pain: Secondary | ICD-10-CM

## 2020-09-14 DIAGNOSIS — M6281 Muscle weakness (generalized): Secondary | ICD-10-CM

## 2020-09-14 NOTE — Therapy (Signed)
[place] [facility] [address] [place], [place], [address] Phone: [phone]   Fax:  [phone]  Physical Therapy Treatment  Patient Details  Name: Christine Robertson MRN: [id number] Date of Birth: [birth year] Referring Provider (PT): Dr. Lorin Picket   Encounter Date: 09/14/2020   PT End of Session - 09/14/20 1353    Visit Number 7    Number of Visits 17    Date for PT Re-Evaluation 10/03/20    Authorization Type eval: 08/08/20    PT Start Time 1351    PT Stop Time 1430    PT Time Calculation (min) 39 min    Activity Tolerance Patient tolerated treatment well    Behavior During Therapy [facility] for tasks assessed/performed           Past Medical History:  Diagnosis Date  . Abnormal results of thyroid function studies   . Allergy    hay fever  . Angina pectoris, unspecified (HCC)   . Arthritis   . Atrial fibrillation, unspecified   . Chicken pox   . Cold hands   . Congenital anomaly of adrenal gland   . Diabetes mellitus    type 2, uncomplicated  . Essential hypertension, benign   . GERD (gastroesophageal reflux disease)   . IBS (irritable bowel syndrome)   . Malaise   . Morbid obesity (HCC)   . Murmur   . Pain in soft tissues of limb   . Proteinuria   . Pure hypercholesterolemia   . Rash and other nonspecific skin eruption   . Sleep apnea    Obstructive; Uses C-Pap machine  . Urinary incontinence     Past Surgical History:  Procedure Laterality Date  . ABDOMINAL HYSTERECTOMY  2010  . BLADDER SURGERY  1967   stretching of bladder  . BREAST BIOPSY Left 03/28/2015   done at Dr. Rutherford Nail office. fibrocystic changes  . COLONOSCOPY    . COLONOSCOPY WITH PROPOFOL N/A 09/09/2015   Procedure: COLONOSCOPY WITH PROPOFOL;  Surgeon: Scot Jun, MD;  Location: [facility] END[facility]COPY;  Service: Endoscopy;  Laterality: N/A;  . ESOPHAGOGASTRODUODENOSCOPY    . TONSILLECTOMY AND ADENOIDECTOMY  1959  . URETHRAL DILATION      There were no  vitals filed for this visit.   Subjective Assessment - 09/14/20 1352    Subjective Pt reports that she is doing alright today. She currently rates her low back pain as 2/10. She feels like her R hip is a little more painful today compared to last session. No specific questions or concerns currently.    Pertinent History Pt reports back pain for multiple years, progressively worsening. Insidious onset without any known trauma or injury. Pain is midline and radiates out laterally on both sides. No pain in buttocks, thighs, or LEs. Pain is worse with standing and walking which is impeding her function. She has been having R sided abdominal pain which was hurting for 3 months however has improved over the last month. She had an abdominal CT which showed slight enlargement in left adrenal mass. 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 Small fat containing umbilical hernia. Chronic changes as described above without acute abnormality. Pt also had plain film lumbar imaging 05/10/20 which showed no fracture or spondylolisthesis. There is moderate disc space narrowing at L2-3, L3-4, L4-5, L5-S1. There are anterior and lateral osteophytes at all levels. There is facet arthropathy at L3-4, L4-5, L5-S1 bilaterally. Over the  course of the last month she has also been having R groin pain when trying to flex R hip to get into or out of the car.    How long can you stand comfortably? 5 minutes    How long can you walk comfortably? 5 minutes    Diagnostic tests See history    Patient Stated Goals Decrease back pain, improve ability to clean house with less pain    Currently in Pain? Yes    Pain Score 2     Pain Location Back    Pain Orientation Lower    Pain Descriptors / Indicators Aching    Pain Type Chronic pain    Pain Onset More than a month ago    Pain Frequency Intermittent                TREATMENT   Ther-ex Hooklying lumbar rockingx10  each direction; Hooklying marches x 10 BLE, 3/10 R hip pain; Sidelying clams with manual resistance from therapist 2 x 10 BLE; Sidelying reverse clams 2 x 10 BLE; Sidelying L hip abduction 2 x 10, attempted RLE but pt unable to perform due to hip pain; Hooklying posterior pelvic tilts5s hold2x10; Hooklying hip fallouts with coordinated breathing 2 x 10BLE; Hooklying partial bridges 5s hold 2 x 10; Seated opposite arm knee isometric hip flexion/abdominal contraction 5s hold 2 x 10 bilateral; Seated cane rows and chest press with therapist providing resistance 2 x 10 each; Seated cane isometric flexion/extension with pt holding cane at 90 flexion and therapist providing superior and inferior resistance 3s hold x 10 each; Seated cane isometric rotation with pt holding cane at 90 flexion and therapist providing right and left resistance 3s hold x 10 each; Sit to stand without UE support 2 x 10; Seated pball forward and lateral roll-outs 5s hold x 10 to each side as well as forward   Pt educated throughout session about proper posture and technique with exercises. Improved exercise technique, movement at target joints, use of target muscles after min to mod verbal, visual, tactile cues.   Right hip painis slightly more aggravated today which does slightly limit exercises performed during this session. Incorporated sidelying exercises today and additional seated exercises. Updated HEP to reflect additional strengthening exercises. Patient encouraged to continue HEP and follow-up as scheduled. Pt will benefit from PT services to address deficits in pain in order to return to full function at home.             PT Short Term Goals - 08/08/20 1453      PT SHORT TERM GOAL #1   Title Pt will be independent with HEP in order to improve strength and decrease back pain in order to improve pain-free function at home.    Time 4    Period Weeks    Status New    Target Date 09/05/20              PT Long Term Goals - 08/08/20 1453      PT LONG TERM GOAL #1   Title Pt will decrease mODI score by at least 13 points in order demonstrate clinically significant reduction in back pain/disability.    Baseline 08/08/20: 46%    Time 8    Period Weeks    Status New    Target Date 10/03/20      PT LONG TERM GOAL #2   Title Pt will improve FOTO to at least 56 in order to demonstrate significant improvement in back  pain and function    Baseline 08/08/20: 44    Time 8    Period Weeks    Status New    Target Date 10/03/20      PT LONG TERM GOAL #3   Title Pt will decrease worst back pain as reported on NPRS by at least 2 points in order to demonstrate clinically significant reduction in back pain.    Baseline 08/08/20: 8/10    Time 8    Period Weeks    Status New    Target Date 10/03/20                 Plan - 09/14/20 1354    Clinical Impression Statement Right hip pain is slightly more aggravated today which does slightly limit exercises performed during this session. Incorporated sidelying exercises today and additional seated exercises. Updated HEP to reflect additional strengthening exercises. Patient encouraged to continue HEP and follow-up as scheduled. Pt will benefit from PT services to address deficits in pain in order to return to full function at home.    Personal Factors and Comorbidities Age;Comorbidity 3+    Comorbidities Obesity, diabetes, hypertension    Examination-Activity Limitations Locomotion Level;Stairs;Stand    Examination-Participation Restrictions Community Activity;Laundry;Meal Prep;Cleaning    Stability/Clinical Decision Making Unstable/Unpredictable    Rehab Potential Fair    PT Frequency 2x / week    PT Duration 8 weeks    PT Treatment/Interventions ADLs/Self Care Home Management;Aquatic Therapy;Biofeedback;Canalith Repostioning;Cryotherapy;Electrical Stimulation;Iontophoresis 4mg /ml Dexamethasone;Moist Heat;Traction;Ultrasound;DME  Instruction;Gait training;Stair training;Functional mobility training;Therapeutic activities;Therapeutic exercise;Balance training;Neuromuscular re-education;Patient/family education;Manual techniques;Passive range of motion;Dry needling;Vestibular;Spinal Manipulations;Joint Manipulations    PT Next Visit Plan Review HEP, progress flexion-based exercises    PT Home Exercise Plan Access Code: [facility]           Patient will benefit from skilled therapeutic intervention in order to improve the following deficits and impairments:  Abnormal gait,Pain,Decreased strength  Visit Diagnosis: Chronic midline low back pain without sciatica  Muscle weakness (generalized)     Problem List Patient Active Problem List   Diagnosis Date Noted  . Low back pain 05/10/2020  . Right sided abdominal pain 05/10/2020  . Back pain 10/06/2019  . Lymphedema 01/23/2018  . Chronic venous insufficiency 01/23/2018  . Swelling of lower extremity 01/16/2018  . SOB (shortness of breath) on exertion 04/27/2017  . Hand lesion 08/21/2015  . Nasal congestion 08/21/2015  . Left breast mass 03/29/2015  . Abnormal mammogram of left breast 03/29/2015  . Health care maintenance 03/14/2015  . Obesity, morbid, BMI 50 or higher (HCC) 06/25/2014  . Foot pain, bilateral 06/25/2014  . Fatigue 01/25/2014  . Obstructive sleep apnea 01/25/2014  . Rash 08/09/2013  . Atrial fibrillation (HCC) 06/24/2013  . Microalbuminuria 06/24/2013  . 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 Low TSH level 12/22/2012  . Diabetes mellitus (HCC) 06/06/2012  . Hypertension 06/06/2012  . Hypercholesteremia 06/06/2012  . Urinary incontinence 06/06/2012  . GERD (gastroesophageal reflux disease) 06/06/2012   08/06/2012 Ashaad Gaertner PT, DPT, GCS  Naila Elizondo 09/14/2020, 3:07 PM   [facility] [address]. [place], [place], [place] Phone: [phone]   Fax:  [phone]  Name: Christine Robertson MRN:  [name] Date of Birth: 07/04/1956

## 2020-09-15 ENCOUNTER — Telehealth: Payer: Self-pay | Admitting: *Deleted

## 2020-09-15 DIAGNOSIS — E7800 Pure hypercholesterolemia, unspecified: Secondary | ICD-10-CM

## 2020-09-15 DIAGNOSIS — E1165 Type 2 diabetes mellitus with hyperglycemia: Secondary | ICD-10-CM

## 2020-09-15 DIAGNOSIS — I10 Essential (primary) hypertension: Secondary | ICD-10-CM

## 2020-09-15 NOTE — Telephone Encounter (Signed)
Orders placed for labs

## 2020-09-15 NOTE — Telephone Encounter (Signed)
Please place future orders for lab appt.  

## 2020-09-16 NOTE — Progress Notes (Signed)
Do we have the samples in the office?

## 2020-09-19 ENCOUNTER — Ambulatory Visit: Payer: Medicare Other

## 2020-09-19 NOTE — Telephone Encounter (Signed)
Pt has been scheduled for labs on 09/21/20

## 2020-09-20 ENCOUNTER — Other Ambulatory Visit: Payer: Medicare Other

## 2020-09-21 ENCOUNTER — Other Ambulatory Visit: Payer: Medicare Other

## 2020-09-22 ENCOUNTER — Other Ambulatory Visit (INDEPENDENT_AMBULATORY_CARE_PROVIDER_SITE_OTHER): Payer: Medicare Other

## 2020-09-22 ENCOUNTER — Other Ambulatory Visit: Payer: Self-pay

## 2020-09-22 ENCOUNTER — Ambulatory Visit: Payer: Medicare Other

## 2020-09-22 ENCOUNTER — Ambulatory Visit (INDEPENDENT_AMBULATORY_CARE_PROVIDER_SITE_OTHER): Payer: Medicare Other | Admitting: Internal Medicine

## 2020-09-22 VITALS — BP 126/70 | HR 80 | Temp 98.1°F | Resp 16 | Ht 64.0 in | Wt 285.0 lb

## 2020-09-22 DIAGNOSIS — M5489 Other dorsalgia: Secondary | ICD-10-CM

## 2020-09-22 DIAGNOSIS — E7800 Pure hypercholesterolemia, unspecified: Secondary | ICD-10-CM

## 2020-09-22 DIAGNOSIS — I4891 Unspecified atrial fibrillation: Secondary | ICD-10-CM | POA: Diagnosis not present

## 2020-09-22 DIAGNOSIS — E1165 Type 2 diabetes mellitus with hyperglycemia: Secondary | ICD-10-CM

## 2020-09-22 DIAGNOSIS — G4733 Obstructive sleep apnea (adult) (pediatric): Secondary | ICD-10-CM

## 2020-09-22 DIAGNOSIS — I10 Essential (primary) hypertension: Secondary | ICD-10-CM

## 2020-09-22 DIAGNOSIS — I872 Venous insufficiency (chronic) (peripheral): Secondary | ICD-10-CM | POA: Diagnosis not present

## 2020-09-22 LAB — MICROALBUMIN / CREATININE URINE RATIO
Creatinine,U: 173.7 mg/dL
Microalb Creat Ratio: 1.3 mg/g (ref 0.0–30.0)
Microalb, Ur: 2.2 mg/dL — ABNORMAL HIGH (ref 0.0–1.9)

## 2020-09-22 LAB — BASIC METABOLIC PANEL
BUN: 26 mg/dL — ABNORMAL HIGH (ref 6–23)
CO2: 29 mEq/L (ref 19–32)
Calcium: 9.6 mg/dL (ref 8.4–10.5)
Chloride: 101 mEq/L (ref 96–112)
Creatinine, Ser: 0.76 mg/dL (ref 0.40–1.20)
GFR: 81.63 mL/min (ref >60.00)
Glucose, Bld: 203 mg/dL — ABNORMAL HIGH (ref 70–99)
Potassium: 4.3 mEq/L (ref 3.5–5.1)
Sodium: 138 mEq/L (ref 135–145)

## 2020-09-22 LAB — HEPATIC FUNCTION PANEL
ALT: 24 U/L (ref 0–35)
AST: 20 U/L (ref 0–37)
Albumin: 4.2 g/dL (ref 3.5–5.2)
Alkaline Phosphatase: 48 U/L (ref 39–117)
Bilirubin, Direct: 0.1 mg/dL (ref 0.0–0.3)
Total Bilirubin: 0.5 mg/dL (ref 0.2–1.2)
Total Protein: 6.8 g/dL (ref 6.0–8.3)

## 2020-09-22 LAB — LIPID PANEL
Cholesterol: 124 mg/dL (ref 0–200)
HDL: 50.8 mg/dL (ref >39.00)
LDL Cholesterol: 34 mg/dL (ref 0–99)
NonHDL: 73.29
Total CHOL/HDL Ratio: 2
Triglycerides: 194 mg/dL — ABNORMAL HIGH (ref 0.0–149.0)
VLDL: 38.8 mg/dL (ref 0.0–40.0)

## 2020-09-22 LAB — HEMOGLOBIN A1C: Hgb A1c MFr Bld: 8.5 % — ABNORMAL HIGH (ref 4.6–6.5)

## 2020-09-22 NOTE — Progress Notes (Signed)
Patient ID: IOANA LOUKS, female   DOB: 03-Oct-1955, 65 y.o.   MRN: [id number]   Subjective:    Patient ID: MAUDRY ZEIDAN, female    DOB: [birth year], 65 y.o.   MRN: [id number]  HPI This visit occurred during the SARS-CoV-2 public health emergency.  Safety protocols were in place, including screening questions prior to the visit, additional usage of staff PPE, and extensive cleaning of exam room while observing appropriate contact time as indicated for disinfecting solutions.  Patient here for a scheduled follow up.  Here to follow up regarding her diabetes, hypertension and hypercholesterolemia.  She has been having increased pain - lower back - left > right.  No radiation down leg.  If she is sitting - no pain.  Increased pain when walking.  When stands - has to lean on something for support.  Request prescription for walker with seat.  No chest pain.  Breathing stable.  No acid reflux reported.  No abdominal pain or bowel change reported.  Blood sugars elevated.  Not watching her diet.  Not checking sugars.  States am sugars 169-170.  On ozempic and metformin.    Past Medical History:  Diagnosis Date  . Abnormal results of thyroid function studies   . Allergy    hay fever  . Angina pectoris, unspecified (HCC)   . Arthritis   . Atrial fibrillation, unspecified   . Chicken pox   . Cold hands   . Congenital anomaly of adrenal gland   . Diabetes mellitus    type 2, uncomplicated  . Essential hypertension, benign   . GERD (gastroesophageal reflux disease)   . IBS (irritable bowel syndrome)   . Malaise   . Morbid obesity (HCC)   . Murmur   . Pain in soft tissues of limb   . Proteinuria   . Pure hypercholesterolemia   . Rash and other nonspecific skin eruption   . Sleep apnea    Obstructive; Uses C-Pap machine  . Urinary incontinence    Past Surgical History:  Procedure Laterality Date  . ABDOMINAL HYSTERECTOMY  2010  . BLADDER SURGERY  1967   stretching of bladder  . BREAST  BIOPSY Left 03/28/2015   done at Dr. Rutherford Nail office. fibrocystic changes  . COLONOSCOPY    . COLONOSCOPY WITH PROPOFOL N/A 09/09/2015   Procedure: COLONOSCOPY WITH PROPOFOL;  Surgeon: Scot Jun, MD;  Location: [facility] ENDOSCOPY;  Service: Endoscopy;  Laterality: N/A;  . ESOPHAGOGASTRODUODENOSCOPY    . TONSILLECTOMY AND ADENOIDECTOMY  1959  . URETHRAL DILATION     Family History  Problem Relation Age of Onset  . Diabetes Mother   . Cancer Father        lung  . Breast cancer Maternal Grandmother 60  . Breast cancer Maternal Aunt 61   Social History   Socioeconomic History  . Marital status: Married    Spouse name: Not on file  . Number of children: Not on file  . Years of education: Not on file  . Highest education level: Not on file  Occupational History  . Not on file  Tobacco Use  . Smoking status: Never Smoker  . Smokeless tobacco: Never Used  Substance and Sexual Activity  . Alcohol use: No    Alcohol/week: 0.0 standard drinks  . Drug use: No  . Sexual activity: Not on file  Other Topics Concern  . Not on file  Social History Narrative  . Not on file   Social Determinants of Health  Financial Resource Strain: Medium Risk  . Difficulty of Paying Living Expenses: Somewhat hard  Food Insecurity: No Food Insecurity  . Worried About Programme researcher, broadcasting/film/videounning Out of Food in the Last Year: Never true  . Ran Out of Food in the Last Year: Never true  Transportation Needs: No Transportation Needs  . Lack of Transportation (Medical): No  . Lack of Transportation (Non-Medical): No  Physical Activity: Unknown  . Days of Exercise per Week: Not on file  . Minutes of Exercise per Session: 0 min  Stress: No Stress Concern Present  . Feeling of Stress : Not at all  Social Connections: Unknown  . Frequency of Communication with Friends and Family: More than three times a week  . Frequency of Social Gatherings with Friends and Family: More than three times a week  . Attends Religious  Services: Not on file  . Active Member of Clubs or Organizations: Not on file  . Attends BankerClub or Organization Meetings: Not on file  . Marital Status: Not on file    Outpatient Encounter Medications as of 09/22/2020  Medication Sig  . Ascorbic Acid (VITAMIN C) 500 MG CAPS Take 500 mg by mouth daily.  Marland Kitchen. aspirin 81 MG tablet Take 81 mg by mouth daily.  . beclomethasone (QVAR) 40 MCG/ACT inhaler Inhale 1 puff into the lungs 2 (two) times daily as needed.  . cetirizine (ZYRTEC) 10 MG tablet Take 10 mg by mouth daily.  . flecainide (TAMBOCOR) 100 MG tablet TAKE 1 TABLET BY MOUTH TWICE DAILY  . hydrochlorothiazide (HYDRODIURIL) 25 MG tablet TAKE ONE TABLET BY MOUTH DAILY  . lisinopril (ZESTRIL) 20 MG tablet TAKE ONE TABLET BY MOUTH DAILY  . metFORMIN (GLUCOPHAGE) 1000 MG tablet TAKE ONE TABLET BY MOUTH TWICE A DAY WITH MEALS  . metoprolol tartrate (LOPRESSOR) 25 MG tablet Take 25 mg by mouth 2 (two) times daily.  . Multiple Vitamin (MULTIVITAMIN) tablet Take 1 tablet by mouth daily.  Marland Kitchen. omeprazole (PRILOSEC) 20 MG capsule TAKE 1 CAPSULE BY MOUTH TWICE DAILY  . oxybutynin (DITROPAN) 5 MG tablet TAKE ONE TABLET BY MOUTH TWICE A DAY  . rosuvastatin (CRESTOR) 10 MG tablet TAKE ONE TABLET BY MOUTH DAILY  . triamcinolone cream (KENALOG) 0.1 % Apply 1 application topically 2 (two) times daily.  . [DISCONTINUED] OZEMPIC, 1 MG/DOSE, 4 MG/3ML SOPN DIAL AND INJECT UNDER THE SKIN 1 MG WEEKLY   No facility-administered encounter medications on file as of 09/22/2020.    Review of Systems  Constitutional: Negative for appetite change and unexpected weight change.  HENT: Negative for congestion and sinus pressure.   Respiratory: Negative for cough and chest tightness.        Breathing stable.    Cardiovascular: Negative for chest pain and palpitations.       No increased leg swelling.  Stable.   Gastrointestinal: Negative for abdominal pain, diarrhea, nausea and vomiting.  Genitourinary: Negative for  difficulty urinating and dysuria.  Musculoskeletal: Positive for back pain. Negative for joint swelling and myalgias.  Skin: Negative for color change and rash.  Neurological: Negative for dizziness, light-headedness and headaches.  Psychiatric/Behavioral: Negative for agitation and dysphoric mood.       Objective:    Physical Exam Vitals reviewed.  Constitutional:      General: She is not in acute distress.    Appearance: Normal appearance.  HENT:     Head: Normocephalic and atraumatic.     Right Ear: External ear normal.     Left Ear: External ear  normal.     Mouth/Throat:     Mouth: Oropharynx is clear and moist.  Eyes:     General: No scleral icterus.       Right eye: No discharge.        Left eye: No discharge.  Neck:     Thyroid: No thyromegaly.  Cardiovascular:     Rate and Rhythm: Normal rate and regular rhythm.  Pulmonary:     Effort: No respiratory distress.     Breath sounds: Normal breath sounds. No wheezing.  Abdominal:     General: Bowel sounds are normal.     Palpations: Abdomen is soft.     Tenderness: There is no abdominal tenderness.  Musculoskeletal:        General: No tenderness or edema.     Cervical back: Neck supple. No tenderness.     Comments: Chronic lower extremity swelling.  Stasis changes present.   Lymphadenopathy:     Cervical: No cervical adenopathy.  Skin:    Findings: No erythema or rash.  Neurological:     Mental Status: She is alert.  Psychiatric:        Mood and Affect: Mood normal.        Behavior: Behavior normal.     BP 126/70   Pulse 80   Temp 98.1 F (36.7 C) (Oral)   Resp 16   Ht 5\' 4"  (1.626 m)   Wt 285 lb (129.3 kg)   SpO2 97%   BMI 48.92 kg/m  Wt Readings from Last 3 Encounters:  09/22/20 285 lb (129.3 kg)  08/03/20 290 lb 6.4 oz (131.7 kg)  07/14/20 299 lb (135.6 kg)     Lab Results  Component Value Date   WBC 9.8 05/10/2020   HGB 13.2 05/10/2020   HCT 38.9 05/10/2020   PLT 257.0 05/10/2020    GLUCOSE 203 (H) 09/22/2020   CHOL 124 09/22/2020   TRIG 194.0 (H) 09/22/2020   HDL 50.80 09/22/2020   LDLDIRECT 101.0 06/05/2018   LDLCALC 34 09/22/2020   ALT 24 09/22/2020   AST 20 09/22/2020   NA 138 09/22/2020   K 4.3 09/22/2020   CL 101 09/22/2020   CREATININE 0.76 09/22/2020   BUN 26 (H) 09/22/2020   CO2 29 09/22/2020   TSH 0.87 10/13/2019   INR 0.99 11/15/2012   HGBA1C 8.5 (H) 09/22/2020   MICROALBUR 2.2 (H) 09/22/2020    CT Abdomen Pelvis W Contrast  Result Date: 05/30/2020 CLINICAL DATA:  Right lower quadrant pain for several weeks EXAM: CT ABDOMEN AND PELVIS WITH CONTRAST TECHNIQUE: Multidetector CT imaging of the abdomen and pelvis was performed using the standard protocol following bolus administration of intravenous contrast. CONTRAST:  06/01/2020 OMNIPAQUE IOHEXOL 350 MG/ML SOLN COMPARISON:  10/11/2005 FINDINGS: Lower chest: Tiny calcified granuloma is noted in the right lower lobe. Hepatobiliary: Fatty infiltration of the liver is noted. Gallbladder is unremarkable. Pancreas: Unremarkable. No pancreatic ductal dilatation or surrounding inflammatory changes. Spleen: Normal in size without focal abnormality. Adrenals/Urinary Tract: Bilobed left adrenal mass is again noted. The more superior component measures 3.1 cm in greatest dimension increased from the prior exam at which time it measured 2.4 cm. The overall length measures 4.3 cm also increased from the prior study. Right adrenal gland is within normal limits. Kidneys demonstrate a normal enhancement pattern bilaterally. No renal calculi or obstructive changes are seen. The bladder is decompressed. Stomach/Bowel: Scattered diverticular change is noted. No evidence of diverticulitis is seen. The appendix is within normal limits.  The stomach and small bowel are unremarkable. Vascular/Lymphatic: No significant vascular findings are present. No enlarged abdominal or pelvic lymph nodes. Reproductive: Status post hysterectomy. No adnexal  masses. Other: No ascites is identified. Small fat containing umbilical hernia is noted. Musculoskeletal: Degenerative changes of lumbar spine are noted. No acute bony abnormality is seen. IMPRESSION: Slight enlargement in left adrenal mass. 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 Small fat containing umbilical hernia. Chronic changes as described above without acute abnormality. Electronically Signed   By: Alcide Clever M.D.   On: 05/30/2020 10:49       Assessment & Plan:   Problem List Items Addressed This Visit    Atrial fibrillation (HCC)    On flecaininde, metoprolol and aspirin.  Stable.        Back pain - Primary    Increased low back pain.  Worsened recently.  Hard to walk or stand.  Request prescription for rolling walker with seat.  Discussed ortho referral.  She initially agreed and then sent message back wanting to hold on referral.  Follow.       Relevant Orders   For home use only DME 4 wheeled rolling walker with seat   Chronic venous insufficiency    Swelling stable.  Compression hose.       Diabetes mellitus (HCC)    On ozempic and metformin.  Sugars elevated.  Discussed diet and exercise.  Libre sensor placed today with instructions.  Hopefully can get more sugar checks to help adjust medication.  Also, have her start monitoring diet - with instant feedback on sugar levels - may help to gain control.       Relevant Orders   Hemoglobin A1c   Basic metabolic panel   Hypercholesteremia    On crestor.  Low cholesterol diet and exercise.  Follow lipid panel and liver function tests.        Relevant Orders   Lipid panel   Hepatic function panel   Hypertension    Continue lisinopril, hctz and metoprolol.  Blood pressure doing well.  Continue current medication.  Follow pressures.  Follow metabolic panel.       Relevant Orders   TSH   Obstructive sleep apnea    Continue cpap.           Dale [place],  MD

## 2020-09-23 ENCOUNTER — Encounter: Payer: Self-pay | Admitting: Internal Medicine

## 2020-09-23 MED ORDER — OZEMPIC (1 MG/DOSE) 4 MG/3ML ~~LOC~~ SOPN: PEN_INJECTOR | SUBCUTANEOUS | 1 refills | Status: DC

## 2020-09-23 NOTE — Telephone Encounter (Signed)
rx sent in for ozempic

## 2020-09-25 ENCOUNTER — Encounter: Payer: Self-pay | Admitting: Internal Medicine

## 2020-09-25 NOTE — Assessment & Plan Note (Signed)
Continue lisinopril, hctz and metoprolol.  Blood pressure doing well.  Continue current medication.  Follow pressures.  Follow metabolic panel.

## 2020-09-25 NOTE — Assessment & Plan Note (Signed)
On crestor.  Low cholesterol diet and exercise.  Follow lipid panel and liver function tests.   

## 2020-09-25 NOTE — Assessment & Plan Note (Signed)
Increased low back pain.  Worsened recently.  Hard to walk or stand.  Request prescription for rolling walker with seat.  Discussed ortho referral.  She initially agreed and then sent message back wanting to hold on referral.  Follow.

## 2020-09-25 NOTE — Assessment & Plan Note (Signed)
On ozempic and metformin.  Sugars elevated.  Discussed diet and exercise.  Libre sensor placed today with instructions.  Hopefully can get more sugar checks to help adjust medication.  Also, have her start monitoring diet - with instant feedback on sugar levels - may help to gain control.

## 2020-09-25 NOTE — Assessment & Plan Note (Signed)
On flecaininde, metoprolol and aspirin.  Stable.

## 2020-09-25 NOTE — Assessment & Plan Note (Signed)
Swelling stable.  Compression hose.

## 2020-09-25 NOTE — Assessment & Plan Note (Signed)
Continue cpap.  

## 2020-09-26 ENCOUNTER — Ambulatory Visit: Payer: Medicare Other

## 2020-09-26 ENCOUNTER — Other Ambulatory Visit: Payer: Self-pay | Admitting: Internal Medicine

## 2020-09-28 ENCOUNTER — Ambulatory Visit: Payer: Medicare Other

## 2020-09-29 ENCOUNTER — Telehealth: Payer: Self-pay | Admitting: Internal Medicine

## 2020-09-29 MED ORDER — LISINOPRIL 20 MG PO TABS: 20.0000 mg | ORAL_TABLET | Freq: Every day | ORAL | 0 refills | Status: DC

## 2020-09-29 NOTE — Telephone Encounter (Signed)
Patient called in need refill for lisinopril (ZESTRIL) 20 MG tablet no refill

## 2020-10-03 ENCOUNTER — Ambulatory Visit: Payer: Medicare Other

## 2020-10-06 ENCOUNTER — Ambulatory Visit (INDEPENDENT_AMBULATORY_CARE_PROVIDER_SITE_OTHER): Payer: Medicare Other | Admitting: Pharmacist

## 2020-10-06 DIAGNOSIS — I10 Essential (primary) hypertension: Secondary | ICD-10-CM

## 2020-10-06 DIAGNOSIS — E7800 Pure hypercholesterolemia, unspecified: Secondary | ICD-10-CM

## 2020-10-06 DIAGNOSIS — E1165 Type 2 diabetes mellitus with hyperglycemia: Secondary | ICD-10-CM

## 2020-10-06 DIAGNOSIS — I4891 Unspecified atrial fibrillation: Secondary | ICD-10-CM

## 2020-10-06 DIAGNOSIS — G4733 Obstructive sleep apnea (adult) (pediatric): Secondary | ICD-10-CM

## 2020-10-06 MED ORDER — FREESTYLE LIBRE 2 SENSOR MISC: 11 refills | Status: DC

## 2020-10-06 NOTE — Patient Instructions (Signed)
Visit Information  PATIENT GOALS: Goals Addressed              This Visit's Progress     Patient Stated   .  Medication Monitoring (pt-stated)        Patient Goals/Self-Care Activities . Over the next 90 days, patient will:  - take medications as prescribed check glucose TID using CGM, document, and provide at future appointments check blood pressure BID, document, and provide at future appointments       Patient verbalizes understanding of instructions provided today and agrees to view in MyChart.    Plan: Telephone follow up appointment with care management team member scheduled for:  ~ 6 weeks  Catie Travis, PharmD, BCACP, CPP Clinical Pharmacist [facility] at [place] [phone]   

## 2020-10-06 NOTE — Chronic Care Management (AMB) (Signed)
Chronic Care Management Pharmacy Note  10/06/2020 Name:  Christine Robertson MRN:  [id number] DOB:  [birth year]  Subjective: Christine Robertson is an 65 y.o. year old female who is a primary patient of Dale [place], MD.  The CCM team was consulted for assistance with disease management and care coordination needs.    Engaged with patient by telephone for follow up visit in response to provider referral for pharmacy case management and/or care coordination services.   Consent to Services:  The patient was given information about Chronic Care Management services, agreed to services, and gave verbal consent prior to initiation of services.  Please see initial visit note for detailed documentation.   Objective:  Lab Results  Component Value Date   CREATININE 0.76 09/22/2020   CREATININE 0.68 05/10/2020   CREATININE 0.71 01/28/2020    Lab Results  Component Value Date   HGBA1C 8.5 (H) 09/22/2020       Component Value Date/Time   CHOL 124 09/22/2020 1140   TRIG 194.0 (H) 09/22/2020 1140   HDL 50.80 09/22/2020 1140   CHOLHDL 2 09/22/2020 1140   VLDL 38.8 09/22/2020 1140   LDLCALC 34 09/22/2020 1140   LDLDIRECT 101.0 06/05/2018 0918    Clinical ASCVD: No  The ASCVD Risk score Denman George DC Jr., et al., 2013) failed to calculate for the following reasons:   The valid total cholesterol range is 130 to 320 mg/dL     [id number] Score = 4  This indicates a 4.8% annual risk of stroke. The patient's score is based upon: CHF History: No HTN History: Yes Diabetes History: Yes Stroke History: No Vascular Disease History: No Age Score: 1 Gender Score: 1   BP Readings from Last 3 Encounters:  09/22/20 126/70  08/03/20 126/72  07/14/20 128/75    Assessment: Review of patient past medical history, allergies, medications, health status, including review of consultants reports, laboratory and other test data, was performed as part of comprehensive evaluation and provision of chronic  care management services.   SDOH:  (Social Determinants of Health) assessments and interventions performed:  SDOH Interventions   Flowsheet Row Most Recent Value  SDOH Interventions   SDOH Interventions for the Following Domains Physical Activity, Financial Strain  Financial Strain Interventions Other (Comment)  [over income for medication assistance]      CCM Care Plan  Allergies  Allergen Reactions  . Erythromycin Hives  . Augmentin [Amoxicillin-Pot Clavulanate] Hives  . Demerol [Meperidine] Nausea Only    Medications Reviewed Today    Reviewed by Lourena Simmonds, RPH-CPP (Pharmacist) on 10/06/20 at 1354  Med List Status: <None>  Medication Order Taking? Sig Documenting Provider Last Dose Status Informant  Ascorbic Acid (VITAMIN C) 500 MG CAPS [id number] Yes Take 500 mg by mouth daily. [provider] Taking Active   aspirin 81 MG tablet [id number] Yes Take 81 mg by mouth daily. [provider] Taking Active Self  beclomethasone (QVAR) 40 MCG/ACT inhaler [id number] No Inhale 1 puff into the lungs 2 (two) times daily as needed.  Patient not taking: Reported on 10/06/2020   [provider] Not Taking Active   cetirizine (ZYRTEC) 10 MG tablet [id number] Yes Take 10 mg by mouth daily. [provider] Taking Active Self  Continuous Blood Gluc Sensor (FREESTYLE LIBRE 2 SENSOR) MISC [id number] Yes Use to check glucose at least TID Dale [place], MD Taking Active   flecainide (TAMBOCOR) 100 MG tablet [id number] Yes Take 100 mg by mouth 2 (two) times daily. [provider] Taking Active   hydrochlorothiazide (HYDRODIURIL) 25 MG tablet [id number] Yes TAKE ONE TABLET BY MOUTH DAILY Dale [place], MD Taking Active   lisinopril (ZESTRIL) 20 MG tablet [id number] Yes Take 1 tablet (20 mg total) by mouth daily. Dale [place], MD Taking Active   metFORMIN (GLUCOPHAGE) 1000 MG tablet [id number] Yes TAKE ONE TABLET BY MOUTH TWICE A DAY WITH MEALS Arnett,  Lyn Records, FNP Taking Active   metoprolol tartrate (LOPRESSOR) 25 MG tablet [id number] Yes Take 25 mg by mouth 2 (two) times daily. Dalia Heading, MD Taking Active   Multiple Vitamin (MULTIVITAMIN) tablet [id number] Yes Take 1 tablet by mouth daily. [provider] Taking Active Self  omeprazole (PRILOSEC) 20 MG capsule [id number] Yes TAKE 1 CAPSULE BY MOUTH TWICE DAILY Dale [place], MD Taking Active            Med Note Tyrone Nine Nov 19, 2019 11:08 AM) Once daily   oxybutynin (DITROPAN) 5 MG tablet [id number] Yes TAKE ONE TABLET BY MOUTH TWICE A Ulice Dash, Westley Hummer, MD Taking Active   rosuvastatin (CRESTOR) 10 MG tablet [id number] Yes TAKE ONE TABLET BY MOUTH DAILY Dale [place], MD Taking Active   Semaglutide, 1 MG/DOSE, (OZEMPIC, 1 MG/DOSE,) 4 MG/3ML Namon Cirri [id number] Yes DIAL AND INJECT UNDER THE SKIN 1 MG WEEKLY Dale [place], MD Taking Active   triamcinolone cream (KENALOG) 0.1 % [id number]  Apply 1 application topically 2 (two) times daily. Dale [place], MD  Active           Patient Active Problem List   Diagnosis Date Noted  . Low back pain 05/10/2020  . Right sided abdominal pain 05/10/2020  . Back pain 10/06/2019  . Lymphedema 01/23/2018  . Chronic venous insufficiency 01/23/2018  . Swelling of lower extremity 01/16/2018  . SOB (shortness of breath) on exertion 04/27/2017  . Hand lesion 08/21/2015  . Nasal congestion 08/21/2015  . Left breast mass 03/29/2015  . Abnormal mammogram of left breast 03/29/2015  . Health care maintenance 03/14/2015  . Foot pain, bilateral 06/25/2014  . Fatigue 01/25/2014  . Obstructive sleep apnea 01/25/2014  . Rash 08/09/2013  . Atrial fibrillation (HCC) 06/24/2013  . Microalbuminuria 06/24/2013  . 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 Low TSH level 12/22/2012  . Diabetes mellitus (HCC) 06/06/2012  . Hypertension 06/06/2012  . Hypercholesteremia 06/06/2012  . Urinary incontinence 06/06/2012  . GERD  (gastroesophageal reflux disease) 06/06/2012    Conditions to be addressed/monitored: HTN, HLD and DMII  Care Plan : Medication Management  Updates made by Lourena Simmonds, RPH-CPP since 10/06/2020 12:00 AM    Problem: Diabetes, HTN, AFib     Long-Range Goal: Disease Progression Prevention   This Visit's Progress: On track  Recent Progress: On track  Priority: High  Note:   Current Barriers:  . Unable to achieve control of diabetes   Pharmacist Clinical Goal(s):  Marland Kitchen Over the next 90 days, patient will verbalize ability to afford treatment regimen . Over the next 90 days, patient will achieve control of diabetes as evidenced by A1c through collaboration with PharmD and provider.  Interventions: . 1:1 collaboration with Dale [place], MD regarding development and update of comprehensive plan of care as evidenced by provider attestation and co-signature . Inter-disciplinary care team collaboration (see longitudinal plan of care) . Comprehensive medication review performed; medication list updated in electronic medical record  Diabetes: . Uncontrolled; current treatment: metformin 1000 mg BID, Ozempic 1 mg weekly  . Reports loving using [place]  2 to help monitor impacts of dietary changes.  . Current glucose readings:  Date of Download: 09/18/20-10/01/20 % Time CGM is active: 59% Average Glucose: 201 mg/dL Glucose Management Indicator: 8.1  Glucose Variability: 16.1% (goal <36%) Time in Goal:  - Time in range 70-180: 30% - Time above range: 70% - Time below range: 0% . Current meal patterns: Breakfast: egg, sometimes oatmeal; Lunch: leftover meat/vegetables from supper; Supper: Grilled chicken, vegetables; vegetable beef soup; Desserts: avoiding; Snacks: Dannon yogurt, Activia yogurt. Drinks: water, diet coke. Multiple mentions of "not eating much" when she notices sugars are high . Current exercise: limited by back pain. Worsened lately. Knows that weight loss can provide  benefit.  . Reviewed CGM data. Discussed that basal glycemic control is needed. The addition of brand copay for SGLT2 is not an option for patient at this time (over income for GLP1 and SGLT2 assistance). More cost effective options to provide basal coverage - pioglitazone, basal insulin - would result in weight gain . Discussed referral to [facility] for more specific dietary education and support. Drastic change in meal planning may prevent need to add weight-prone options to control glucose. Patient amenable to referral. Will collaborate w/ PCP on this  Hyperlipidemia and ASCVD risk reducation: . Controlled per last lipid panel; current treatment: rosuvastatin 10 mg daily   . Recommended to continue current regimen at this time  Atrial Fibrillation with HTN: . Appropriately managed; current rhythm control: flecainide 100 mg BID, current rate control: metoprolol tartrate 25 mg BID; anticoagulant treatment: none, Dr. Lady Gary elected to continue aspirin instead of DOAC; also on lisinopril 20 mg + HCTZ 25 mg for HTN . Patient has not been checking BP at home lately. Last BP reading very well controlled in office.  . Continue current regimen with collaboration w/ cardiology at this time.   Overactive Bladder: . Controlled, current treatment; oxybutynin 5 mg BID . Consider change to XL formulation to reduce risk of anticholinergic side effects . Continue current regimen at this time  Supplements/OTC: Marland Kitchen Multivitamin, Vitamin D, cetirizine  Patient Goals/Self-Care Activities . Over the next 90 days, patient will:  - take medications as prescribed check glucose TID using CGM, document, and provide at future appointments check blood pressure BID, document, and provide at future appointments  Follow Up Plan: Telephone follow up appointment with care management team member scheduled for: ~6 weeks      Medication Assistance: Patient is over income for medication assistance  programs.   Follow Up:  Patient agrees to Care Plan and Follow-up.  Plan: Telephone follow up appointment with care management team member scheduled for:  ~ 6 weeks  [name], PharmD, [place], CPP Clinical Pharmacist [facility] at [facility] [facility] [phone]

## 2020-10-07 ENCOUNTER — Other Ambulatory Visit: Payer: Self-pay | Admitting: Internal Medicine

## 2020-10-23 ENCOUNTER — Other Ambulatory Visit: Payer: Self-pay | Admitting: Internal Medicine

## 2020-10-29 ENCOUNTER — Other Ambulatory Visit: Payer: Self-pay | Admitting: Family

## 2020-10-29 ENCOUNTER — Other Ambulatory Visit: Payer: Self-pay | Admitting: Internal Medicine

## 2020-12-01 ENCOUNTER — Ambulatory Visit (INDEPENDENT_AMBULATORY_CARE_PROVIDER_SITE_OTHER): Payer: Medicare Other | Admitting: Pharmacist

## 2020-12-01 DIAGNOSIS — I4891 Unspecified atrial fibrillation: Secondary | ICD-10-CM

## 2020-12-01 DIAGNOSIS — I10 Essential (primary) hypertension: Secondary | ICD-10-CM | POA: Diagnosis not present

## 2020-12-01 DIAGNOSIS — E7800 Pure hypercholesterolemia, unspecified: Secondary | ICD-10-CM

## 2020-12-01 DIAGNOSIS — E1165 Type 2 diabetes mellitus with hyperglycemia: Secondary | ICD-10-CM

## 2020-12-01 MED ORDER — FREESTYLE LIBRE 2 SENSOR MISC: 11 refills | Status: DC

## 2020-12-01 NOTE — Chronic Care Management (AMB) (Signed)
Chronic Care Management Pharmacy Note  12/01/2020 Name:  MARYALYCE SANJUAN MRN:  [id number] DOB:  26-Dec-1955  Subjective: Christine Robertson is an 65 y.o. year old female who is a primary patient of Einar Pheasant, MD.  The CCM team was consulted for assistance with disease management and care coordination needs.    Engaged with patient by telephone for follow up visit in response to provider referral for pharmacy case management and/or care coordination services.   Consent to Services:  The patient was given information about Chronic Care Management services, agreed to services, and gave verbal consent prior to initiation of services.  Please see initial visit note for detailed documentation.   Patient Care Team: Einar Pheasant, MD as PCP - General (Internal Medicine) Einar Pheasant, MD (Internal Medicine) Bary Castilla Forest Gleason, MD (General Surgery) De Hollingshead, RPH-CPP (Pharmacist)  Recent office visits: None since our last call  Recent consult visits: 3/25- tested positive for [facility] visits: None in previous 6 months  Objective:  Lab Results  Component Value Date   CREATININE 0.76 09/22/2020   CREATININE 0.68 05/10/2020   CREATININE 0.71 01/28/2020    Lab Results  Component Value Date   HGBA1C 8.5 (H) 09/22/2020   Last diabetic Eye exam:  Lab Results  Component Value Date/Time   HMDIABEYEEXA No Retinopathy 01/14/2018 12:00 AM    Last diabetic Foot exam:  Lab Results  Component Value Date/Time   HMDIABFOOTEX on my exam.  10/04/2017 12:00 AM        Component Value Date/Time   CHOL 124 09/22/2020 1140   TRIG 194.0 (H) 09/22/2020 1140   HDL 50.80 09/22/2020 1140   CHOLHDL 2 09/22/2020 1140   VLDL 38.8 09/22/2020 1140   LDLCALC 34 09/22/2020 1140   LDLDIRECT 101.0 06/05/2018 0918    Hepatic Function Latest Ref Rng & Units 09/22/2020 05/10/2020 01/28/2020  Total Protein 6.0 - 8.3 g/dL 6.8 6.9 6.7  Albumin 3.5 - 5.2 g/dL 4.2 4.2 4.2  AST 0 -  37 U/L _0 ALT 0 - 35 U/L _1 Alk Phosphatase 39 - 117 U/L 48 51 59  Total Bilirubin 0.2 - 1.2 mg/dL 0.5 0.4 0.3  Bilirubin, Direct 0.0 - 0.3 mg/dL 0.1 0.1 0.1    Lab Results  Component Value Date/Time   TSH 0.87 10/13/2019 10:53 AM   TSH 1.00 06/05/2018 09:18 AM   FREET4 1.05 12/05/2012 10:27 AM    CBC Latest Ref Rng & Units 05/10/2020 02/11/2020 01/28/2020  WBC 4.0 - 10.5 K/uL 9.8 11.4(H) 12.1(H)  Hemoglobin 12.0 - 15.0 g/dL 13.2 13.2 13.4  Hematocrit 36.0 - 46.0 % 38.9 39.4 41.0  Platelets 150.0 - 400.0 K/uL 257.0 234.0 258.0   Clinical ASCVD: No   Social History   Tobacco Use  Smoking Status Never Smoker  Smokeless Tobacco Never Used   BP Readings from Last 3 Encounters:  09/22/20 126/70  08/03/20 126/72  07/14/20 128/75   Pulse Readings from Last 3 Encounters:  09/22/20 80  08/03/20 80  05/10/20 89   Wt Readings from Last 3 Encounters:  09/22/20 285 lb (129.3 kg)  08/03/20 290 lb 6.4 oz (131.7 kg)  07/14/20 299 lb (135.6 kg)    Assessment: Review of patient past medical history, allergies, medications, health status, including review of consultants reports, laboratory and other test data, was performed as part of comprehensive evaluation and provision of chronic care management services.   SDOH:  (Social Determinants of  Health) assessments and interventions performed: none oday   CCM Care Plan  Allergies  Allergen Reactions  . Erythromycin Hives  . Augmentin [Amoxicillin-Pot Clavulanate] Hives  . Demerol [Meperidine] Nausea Only    Medications Reviewed Today    Reviewed by De Hollingshead, RPH-CPP (Pharmacist) on 10/06/20 at 1354  Med List Status: <None>  Medication Order Taking? Sig Documenting Provider Last Dose Status Informant  Ascorbic Acid (VITAMIN C) 500 MG CAPS [id number] Yes Take 500 mg by mouth daily. [provider] Taking Active   aspirin 81 MG tablet [id number] Yes Take 81 mg by mouth daily. [provider]  Taking Active Self  beclomethasone (QVAR) 40 MCG/ACT inhaler [id number] No Inhale 1 puff into the lungs 2 (two) times daily as needed.  Patient not taking: Reported on 10/06/2020   [provider] Not Taking Active   cetirizine (ZYRTEC) 10 MG tablet [id number] Yes Take 10 mg by mouth daily. [provider] Taking Active Self  Continuous Blood Gluc Sensor (FREESTYLE LIBRE 2 SENSOR) MISC [id number] Yes Use to check glucose at least TID Einar Pheasant, MD Taking Active   flecainide (TAMBOCOR) 100 MG tablet [id number] Yes Take 100 mg by mouth 2 (two) times daily. [provider] Taking Active   hydrochlorothiazide (HYDRODIURIL) 25 MG tablet [id number] Yes TAKE ONE TABLET BY MOUTH DAILY Einar Pheasant, MD Taking Active   lisinopril (ZESTRIL) 20 MG tablet [id number] Yes Take 1 tablet (20 mg total) by mouth daily. Einar Pheasant, MD Taking Active   metFORMIN (GLUCOPHAGE) 1000 MG tablet [id number] Yes TAKE ONE TABLET BY MOUTH TWICE A DAY WITH MEALS Arnett, Yvetta Coder, FNP Taking Active   metoprolol tartrate (LOPRESSOR) 25 MG tablet [id number] Yes Take 25 mg by mouth 2 (two) times daily. Teodoro Spray, MD Taking Active   Multiple Vitamin (MULTIVITAMIN) tablet [id number] Yes Take 1 tablet by mouth daily. [provider] Taking Active Self  omeprazole (PRILOSEC) 20 MG capsule [id number] Yes TAKE 1 CAPSULE BY MOUTH TWICE DAILY Einar Pheasant, MD Taking Active            Med Note Nat Christen Nov 19, 2019 11:08 AM) Once daily   oxybutynin (DITROPAN) 5 MG tablet [id number] Yes TAKE ONE TABLET BY MOUTH TWICE A Maxcine Ham, Randell Patient, MD Taking Active   rosuvastatin (CRESTOR) 10 MG tablet [id number] Yes TAKE ONE TABLET BY MOUTH DAILY Einar Pheasant, MD Taking Active   Semaglutide, 1 MG/DOSE, (OZEMPIC, 1 MG/DOSE,) 4 MG/3ML Bonney Aid [id number] Yes DIAL AND INJECT UNDER THE SKIN 1 MG WEEKLY Einar Pheasant, MD Taking Active   triamcinolone cream (KENALOG) 0.1 % [id number]  Apply 1  application topically 2 (two) times daily. Einar Pheasant, MD  Active           Patient Active Problem List   Diagnosis Date Noted  . Low back pain 05/10/2020  . Right sided abdominal pain 05/10/2020  . Back pain 10/06/2019  . Lymphedema 01/23/2018  . Chronic venous insufficiency 01/23/2018  . Swelling of lower extremity 01/16/2018  . SOB (shortness of breath) on exertion 04/27/2017  . Hand lesion 08/21/2015  . Nasal congestion 08/21/2015  . Left breast mass 03/29/2015  . Abnormal mammogram of left breast 03/29/2015  . Health care maintenance 03/14/2015  . Foot pain, bilateral 06/25/2014  . Fatigue 01/25/2014  . Obstructive sleep apnea 01/25/2014  . Rash 08/09/2013  . Atrial fibrillation ([place]) 06/24/2013  . Microalbuminuria 06/24/2013  . 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 Low TSH level 12/22/2012  .  Diabetes mellitus ([place]) 06/06/2012  . Hypertension 06/06/2012  . Hypercholesteremia 06/06/2012  . Urinary incontinence 06/06/2012  . GERD (gastroesophageal reflux disease) 06/06/2012    Immunization History  Administered Date(s) Administered  . Fluad Quad(high Dose 65+) 05/10/2020  . Influenza Split 06/06/2012, 06/03/2014  . Influenza,inj,Quad PF,6+ Mos 06/23/2013, 05/18/2015, 07/16/2016, 07/03/2017  . PFIZER(Purple Top)SARS-COV-2 Vaccination 10/16/2019, 11/11/2019  . Pneumococcal Polysaccharide-23 08/31/2016  . Zoster 06/01/2015    Conditions to be addressed/monitored: Atrial Fibrillation, HTN, HLD and DMII  Care Plan : Medication Management  Updates made by De Hollingshead, RPH-CPP since 12/01/2020 12:00 AM    Problem: Diabetes, HTN, AFib     Long-Range Goal: Disease Progression Prevention   This Visit's Progress: On track  Recent Progress: On track  Priority: High  Note:   Current Barriers:  . Unable to achieve control of diabetes   Pharmacist Clinical Goal(s):  Marland Kitchen Over the next 90 days, patient will verbalize ability to afford treatment regimen . Over  the next 90 days, patient will achieve control of diabetes as evidenced by A1c through collaboration with PharmD and provider.  Interventions: . 1:1 collaboration with Einar Pheasant, MD regarding development and update of comprehensive plan of care as evidenced by provider attestation and co-signature . Inter-disciplinary care team collaboration (see longitudinal plan of care) . Comprehensive medication review performed; medication list updated in electronic medical record  Health Maintenance: . Diagnosed with COVID 3/25. Notes she had a severe sore throat, fever initially, but now just has congestion.   Diabetes: . Uncontrolled; current treatment: metformin 1000 mg BID, Ozempic 1 mg weekly  . Reports loving using Libre 2 to help monitor impacts of dietary changes. Has not been able to get a refill since we last spoke. . Current glucose readings: fingerstick readings still in 150-160s . Ozempic 2 mg dose was FDA approved earlier this week. Expected to be available in May. Will plan to increase dose to 2 mg weekly when available.  . Previously discussed referral to [facility]. Encourage to discuss w/ PCP at next appt . Contacted [facility] to follow up on why they were unable to fill [place] 2. They note the price for 2 sensors is $140. Re-sent script to local [facility]. Patient given the Abbott # to call if copay is >$75 per month  Hyperlipidemia and ASCVD risk reducation: . Controlled per last lipid panel; current treatment: rosuvastatin 10 mg daily   . Recommended to continue current regimen at this time  Atrial Fibrillation with HTN: . Appropriately managed; current rhythm control: flecainide 100 mg BID, current rate control: metoprolol tartrate 25 mg BID; anticoagulant treatment: none, Dr. Ubaldo Glassing elected to continue aspirin instead of DOAC; also on lisinopril 20 mg + HCTZ 25 mg for HTN . Continue current regimen with collaboration w/ cardiology at this time.   Overactive  Bladder: . Controlled, current treatment; oxybutynin 5 mg BID . Consider change to XL formulation to reduce risk of anticholinergic side effects . Continue current regimen at this time  Supplements/OTC: Marland Kitchen Multivitamin, Vitamin D, cetirizine  Patient Goals/Self-Care Activities . Over the next 90 days, patient will:  - take medications as prescribed check glucose TID using CGM, document, and provide at future appointments check blood pressure BID, document, and provide at future appointments  Follow Up Plan: Telephone follow up appointment with care management team member scheduled for: ~8 weeks      Medication Assistance: Over income for assistance  Patient's preferred pharmacy is:  [facility] -  [place], [place] [address] Phone: [phone] Fax: [phone]   Follow Up:  Patient agrees to Care Plan and Follow-up.  Plan: Telephone follow up appointment with care management team member scheduled for:  ~ 8 weeks  Catie Darnelle Maffucci, PharmD, [place], [place] Clinical Pharmacist [employer] at [employer] [phone]

## 2020-12-01 NOTE — Patient Instructions (Signed)
Visit Information  PATIENT GOALS: Goals Addressed              This Visit's Progress     Patient Stated   .  Medication Monitoring (pt-stated)         Patient Goals/Self-Care Activities . Over the next 90 days, patient will:  - take medications as prescribed check glucose three times daily using CGM, document, and provide at future appointments check blood pressure periodically, document, and provide at future appointments        Patient verbalizes understanding of instructions provided today and agrees to view in MyChart.  Plan: Telephone follow up appointment with care management team member scheduled for:  ~ 8 weeks  [name], PharmD, [place], CPP Clinical Pharmacist [employer] at [employer] [phone]

## 2020-12-02 ENCOUNTER — Other Ambulatory Visit: Payer: Self-pay | Admitting: Internal Medicine

## 2020-12-06 ENCOUNTER — Other Ambulatory Visit: Payer: Self-pay | Admitting: Internal Medicine

## 2021-01-02 ENCOUNTER — Other Ambulatory Visit: Payer: Self-pay | Admitting: Internal Medicine

## 2021-01-17 ENCOUNTER — Other Ambulatory Visit: Payer: Medicare Other

## 2021-01-18 ENCOUNTER — Other Ambulatory Visit: Payer: Self-pay | Admitting: Internal Medicine

## 2021-01-19 ENCOUNTER — Ambulatory Visit: Payer: Medicare Other | Admitting: Pharmacist

## 2021-01-19 DIAGNOSIS — I4891 Unspecified atrial fibrillation: Secondary | ICD-10-CM

## 2021-01-19 DIAGNOSIS — E1165 Type 2 diabetes mellitus with hyperglycemia: Secondary | ICD-10-CM

## 2021-01-19 DIAGNOSIS — I10 Essential (primary) hypertension: Secondary | ICD-10-CM

## 2021-01-19 DIAGNOSIS — E7800 Pure hypercholesterolemia, unspecified: Secondary | ICD-10-CM

## 2021-01-19 MED ORDER — OZEMPIC (2 MG/DOSE) 8 MG/3ML ~~LOC~~ SOPN: 2.0000 mg | PEN_INJECTOR | SUBCUTANEOUS | 1 refills | Status: DC

## 2021-01-19 NOTE — Chronic Care Management (AMB) (Signed)
Chronic Care Management Pharmacy Note  01/19/2021 Name:  Christine Robertson MRN:  [id number] DOB:  [birth year]  Subjective: Christine Robertson is an 65 y.o. year old female who is a primary patient of Einar Pheasant, MD.  The CCM team was consulted for assistance with disease management and care coordination needs.    Engaged with patient by telephone for reponse to phone call question in response to provider referral for pharmacy case management and/or care coordination services.   Consent to Services:  The patient was given information about Chronic Care Management services, agreed to services, and gave verbal consent prior to initiation of services.  Please see initial visit note for detailed documentation.   Patient Care Team: Einar Pheasant, MD as PCP - General (Internal Medicine) Einar Pheasant, MD (Internal Medicine) Bary Castilla Forest Gleason, MD (General Surgery) De Hollingshead, RPH-CPP (Pharmacist)  Recent office visits: None since our last call  Recent consult visits: None since our last call  Hospital visits: None in previous 6 months  Objective:  Lab Results  Component Value Date   CREATININE 0.76 09/22/2020   CREATININE 0.68 05/10/2020   CREATININE 0.71 01/28/2020    Lab Results  Component Value Date   HGBA1C 8.5 (H) 09/22/2020   Last diabetic Eye exam:  Lab Results  Component Value Date/Time   HMDIABEYEEXA No Retinopathy 01/14/2018 12:00 AM    Last diabetic Foot exam:  Lab Results  Component Value Date/Time   HMDIABFOOTEX on my exam.  10/04/2017 12:00 AM        Component Value Date/Time   CHOL 124 09/22/2020 1140   TRIG 194.0 (H) 09/22/2020 1140   HDL 50.80 09/22/2020 1140   CHOLHDL 2 09/22/2020 1140   VLDL 38.8 09/22/2020 1140   LDLCALC 34 09/22/2020 1140   LDLDIRECT 101.0 06/05/2018 0918    Hepatic Function Latest Ref Rng & Units 09/22/2020 05/10/2020 01/28/2020  Total Protein 6.0 - 8.3 g/dL 6.8 6.9 6.7  Albumin 3.5 - 5.2 g/dL 4.2 4.2 4.2   AST 0 - 37 U/L $Remo'20 27 18  'ipyBK$ ALT 0 - 35 U/L $Remo'24 30 24  'pqYbH$ Alk Phosphatase 39 - 117 U/L 48 51 59  Total Bilirubin 0.2 - 1.2 mg/dL 0.5 0.4 0.3  Bilirubin, Direct 0.0 - 0.3 mg/dL 0.1 0.1 0.1    Lab Results  Component Value Date/Time   TSH 0.87 10/13/2019 10:53 AM   TSH 1.00 06/05/2018 09:18 AM   FREET4 1.05 12/05/2012 10:27 AM    CBC Latest Ref Rng & Units 05/10/2020 02/11/2020 01/28/2020  WBC 4.0 - 10.5 K/uL 9.8 11.4(H) 12.1(H)  Hemoglobin 12.0 - 15.0 g/dL 13.2 13.2 13.4  Hematocrit 36.0 - 46.0 % 38.9 39.4 41.0  Platelets 150.0 - 400.0 K/uL 257.0 234.0 258.0   Clinical ASCVD: No  The ASCVD Risk score Mikey Bussing DC Jr., et al., 2013) failed to calculate for the following reasons:   The valid total cholesterol range is 130 to 320 mg/dL     Social History   Tobacco Use  Smoking Status Never Smoker  Smokeless Tobacco Never Used   BP Readings from Last 3 Encounters:  09/22/20 126/70  08/03/20 126/72  07/14/20 128/75   Pulse Readings from Last 3 Encounters:  09/22/20 80  08/03/20 80  05/10/20 89   Wt Readings from Last 3 Encounters:  09/22/20 285 lb (129.3 kg)  08/03/20 290 lb 6.4 oz (131.7 kg)  07/14/20 299 lb (135.6 kg)    Assessment: Review of patient past medical history, allergies,  medications, health status, including review of consultants reports, laboratory and other test data, was performed as part of comprehensive evaluation and provision of chronic care management services.   SDOH:  (Social Determinants of Health) assessments and interventions performed:  SDOH Interventions   Flowsheet Row Most Recent Value  SDOH Interventions   Financial Strain Interventions Intervention Not Indicated      CCM Care Plan  Allergies  Allergen Reactions  . Erythromycin Hives  . Augmentin [Amoxicillin-Pot Clavulanate] Hives  . Demerol [Meperidine] Nausea Only    Medications Reviewed Today    Reviewed by De Hollingshead, RPH-CPP (Pharmacist) on 10/06/20 at 1354  Med List Status:  <None>  Medication Order Taking? Sig Documenting Provider Last Dose Status Informant  Ascorbic Acid (VITAMIN C) 500 MG CAPS [id number] Yes Take 500 mg by mouth daily. [provider] Taking Active   aspirin 81 MG tablet [id number] Yes Take 81 mg by mouth daily. [provider] Taking Active Self  beclomethasone (QVAR) 40 MCG/ACT inhaler [id number] No Inhale 1 puff into the lungs 2 (two) times daily as needed.  Patient not taking: Reported on 10/06/2020   [provider] Not Taking Active   cetirizine (ZYRTEC) 10 MG tablet [id number] Yes Take 10 mg by mouth daily. [provider] Taking Active Self  Continuous Blood Gluc Sensor (FREESTYLE LIBRE 2 SENSOR) MISC [id number] Yes Use to check glucose at least TID Einar Pheasant, MD Taking Active   flecainide (TAMBOCOR) 100 MG tablet [id number] Yes Take 100 mg by mouth 2 (two) times daily. [provider] Taking Active   hydrochlorothiazide (HYDRODIURIL) 25 MG tablet [id number] Yes TAKE ONE TABLET BY MOUTH DAILY Einar Pheasant, MD Taking Active   lisinopril (ZESTRIL) 20 MG tablet [id number] Yes Take 1 tablet (20 mg total) by mouth daily. Einar Pheasant, MD Taking Active   metFORMIN (GLUCOPHAGE) 1000 MG tablet [id number] Yes TAKE ONE TABLET BY MOUTH TWICE A DAY WITH MEALS Arnett, Yvetta Coder, FNP Taking Active   metoprolol tartrate (LOPRESSOR) 25 MG tablet [id number] Yes Take 25 mg by mouth 2 (two) times daily. Teodoro Spray, MD Taking Active   Multiple Vitamin (MULTIVITAMIN) tablet [id number] Yes Take 1 tablet by mouth daily. [provider] Taking Active Self  omeprazole (PRILOSEC) 20 MG capsule [id number] Yes TAKE 1 CAPSULE BY MOUTH TWICE DAILY Einar Pheasant, MD Taking Active            Med Note Nat Christen Nov 19, 2019 11:08 AM) Once daily   oxybutynin (DITROPAN) 5 MG tablet [id number] Yes TAKE ONE TABLET BY MOUTH TWICE A Maxcine Ham, Randell Patient, MD Taking Active   rosuvastatin (CRESTOR) 10 MG tablet  [id number] Yes TAKE ONE TABLET BY MOUTH DAILY Einar Pheasant, MD Taking Active   Semaglutide, 1 MG/DOSE, (OZEMPIC, 1 MG/DOSE,) 4 MG/3ML Bonney Aid [id number] Yes DIAL AND INJECT UNDER THE SKIN 1 MG WEEKLY Einar Pheasant, MD Taking Active   triamcinolone cream (KENALOG) 0.1 % [id number]  Apply 1 application topically 2 (two) times daily. Einar Pheasant, MD  Active           Patient Active Problem List   Diagnosis Date Noted  . Low back pain 05/10/2020  . Right sided abdominal pain 05/10/2020  . Back pain 10/06/2019  . Lymphedema 01/23/2018  . Chronic venous insufficiency 01/23/2018  . Swelling of lower extremity 01/16/2018  . SOB (shortness of breath) on exertion 04/27/2017  . Hand lesion 08/21/2015  . Nasal congestion 08/21/2015  . Left breast mass 03/29/2015  .  Abnormal mammogram of left breast 03/29/2015  . Health care maintenance 03/14/2015  . Foot pain, bilateral 06/25/2014  . Fatigue 01/25/2014  . Obstructive sleep apnea 01/25/2014  . Rash 08/09/2013  . Atrial fibrillation ([place]) 06/24/2013  . Microalbuminuria 06/24/2013  . 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 Low TSH level 12/22/2012  . Diabetes mellitus ([place]) 06/06/2012  . Hypertension 06/06/2012  . Hypercholesteremia 06/06/2012  . Urinary incontinence 06/06/2012  . GERD (gastroesophageal reflux disease) 06/06/2012    Immunization History  Administered Date(s) Administered  . Fluad Quad(high Dose 65+) 05/10/2020  . Influenza Split 06/06/2012, 06/03/2014  . Influenza,inj,Quad PF,6+ Mos 06/23/2013, 05/18/2015, 07/16/2016, 07/03/2017  . PFIZER(Purple Top)SARS-COV-2 Vaccination 10/16/2019, 11/11/2019  . Pneumococcal Polysaccharide-23 08/31/2016  . Zoster 06/01/2015    Conditions to be addressed/monitored: Atrial Fibrillation, HTN, HLD and DMII  Care Plan : Medication Management  Updates made by De Hollingshead, RPH-CPP since 01/19/2021 12:00 AM    Problem: Diabetes, HTN, AFib     Long-Range Goal: Disease  Progression Prevention   This Visit's Progress: On track  Recent Progress: On track  Priority: High  Note:   Current Barriers:  . Unable to achieve control of diabetes   Pharmacist Clinical Goal(s):  Marland Kitchen Over the next 90 days, patient will verbalize ability to afford treatment regimen . Over the next 90 days, patient will achieve control of diabetes as evidenced by A1c through collaboration with PharmD and provider.  Interventions: . 1:1 collaboration with Einar Pheasant, MD regarding development and update of comprehensive plan of care as evidenced by provider attestation and co-signature . Inter-disciplinary care team collaboration (see longitudinal plan of care) . Comprehensive medication review performed; medication list updated in electronic medical record    Diabetes: . Uncontrolled; current treatment: metformin 1000 mg BID, Ozempic 1 mg weekly  . Calls today to request increasing Ozempic to 2 mg weekly as we previously discussed. Counseled on potential side effects of nausea, stomach upset, queasiness, constipation with GLP1 dose increases and that these generally improve over time. Advised to contact our office with more severe symptoms, including nausea, diarrhea, stomach pain. Patient verbalized understanding. . Follow up in June as previously scheduled   Hyperlipidemia and ASCVD risk reducation: . Controlled per last lipid panel; current treatment: rosuvastatin 10 mg daily   . Previously recommended to continue current regimen at this time  Atrial Fibrillation with HTN: . Appropriately managed; current rhythm control: flecainide 100 mg BID, current rate control: metoprolol tartrate 25 mg BID; anticoagulant treatment: none, Dr. Ubaldo Glassing elected to continue aspirin instead of DOAC; also on lisinopril 20 mg + HCTZ 25 mg for HTN . Previously recommended to current regimen with collaboration w/ cardiology at this time.   Overactive Bladder: . Controlled, current treatment; oxybutynin  5 mg BID . Consider change to XL formulation to reduce risk of anticholinergic side effects . Previously recommended to continue current regimen at this time  Supplements/OTC: Marland Kitchen Multivitamin, Vitamin D, cetirizine  Patient Goals/Self-Care Activities . Over the next 90 days, patient will:  - take medications as prescribed check glucose three times daily using CGM, document, and provide at future appointments check blood pressure twice daily, document, and provide at future appointments  Follow Up Plan: Telephone follow up appointment with care management team member scheduled for: ~4 weeks as previously scheduled       Medication Assistance: None required.  Patient affirms current coverage meets needs.  Patient's preferred pharmacy is:  [facility], [place]  [address] [place] [address] Phone: [phone] Fax: [phone]   Follow Up:  Patient agrees to Care Plan and Follow-up.  Plan: Telephone follow up appointment with care management team member scheduled for:  ~ 4 weeks as previously scheduled  Catie Darnelle Maffucci, PharmD, [place], [place] Clinical Pharmacist [employer] at [employer] [phone]

## 2021-01-19 NOTE — Patient Instructions (Signed)
Visit Information  PATIENT GOALS: Goals Addressed              This Visit's Progress     Patient Stated   .  Medication Monitoring (pt-stated)        Patient Goals/Self-Care Activities . Over the next 90 days, patient will:  - take medications as prescribed check glucose three times daily using CGM, document, and provide at future appointments check blood pressure periodically, document, and provide at future appointments        Patient verbalizes understanding of instructions provided today and agrees to view in MyChart.   Plan: Telephone follow up appointment with care management team member scheduled for:  ~ 4 weeks as previously scheduled  [name], PharmD, [place], CPP Clinical Pharmacist [employer] at [employer] [phone]

## 2021-01-20 ENCOUNTER — Ambulatory Visit: Payer: Medicare Other | Admitting: Internal Medicine

## 2021-01-29 ENCOUNTER — Other Ambulatory Visit: Payer: Self-pay | Admitting: Internal Medicine

## 2021-02-05 ENCOUNTER — Other Ambulatory Visit: Payer: Self-pay | Admitting: Family

## 2021-02-06 ENCOUNTER — Other Ambulatory Visit: Payer: Medicare Other

## 2021-02-08 ENCOUNTER — Other Ambulatory Visit (INDEPENDENT_AMBULATORY_CARE_PROVIDER_SITE_OTHER): Payer: Medicare Other

## 2021-02-08 ENCOUNTER — Other Ambulatory Visit: Payer: Self-pay

## 2021-02-08 DIAGNOSIS — E1165 Type 2 diabetes mellitus with hyperglycemia: Secondary | ICD-10-CM | POA: Diagnosis not present

## 2021-02-08 DIAGNOSIS — E7800 Pure hypercholesterolemia, unspecified: Secondary | ICD-10-CM

## 2021-02-08 DIAGNOSIS — I10 Essential (primary) hypertension: Secondary | ICD-10-CM | POA: Diagnosis not present

## 2021-02-08 LAB — BASIC METABOLIC PANEL
BUN: 26 mg/dL — ABNORMAL HIGH (ref 6–23)
CO2: 29 mEq/L (ref 19–32)
Calcium: 10 mg/dL (ref 8.4–10.5)
Chloride: 101 mEq/L (ref 96–112)
Creatinine, Ser: 0.77 mg/dL (ref 0.40–1.20)
GFR: 80.15 mL/min (ref >60.00)
Glucose, Bld: 182 mg/dL — ABNORMAL HIGH (ref 70–99)
Potassium: 4.2 mEq/L (ref 3.5–5.1)
Sodium: 140 mEq/L (ref 135–145)

## 2021-02-08 LAB — HEPATIC FUNCTION PANEL
ALT: 26 U/L (ref 0–35)
AST: 18 U/L (ref 0–37)
Albumin: 4.2 g/dL (ref 3.5–5.2)
Alkaline Phosphatase: 59 U/L (ref 39–117)
Bilirubin, Direct: 0.1 mg/dL (ref 0.0–0.3)
Total Bilirubin: 0.5 mg/dL (ref 0.2–1.2)
Total Protein: 6.7 g/dL (ref 6.0–8.3)

## 2021-02-08 LAB — LIPID PANEL
Cholesterol: 124 mg/dL (ref 0–200)
HDL: 52.1 mg/dL (ref >39.00)
LDL Cholesterol: 33 mg/dL (ref 0–99)
NonHDL: 72.04
Total CHOL/HDL Ratio: 2
Triglycerides: 194 mg/dL — ABNORMAL HIGH (ref 0.0–149.0)
VLDL: 38.8 mg/dL (ref 0.0–40.0)

## 2021-02-08 LAB — TSH: TSH: 0.71 u[IU]/mL (ref 0.35–4.50)

## 2021-02-08 LAB — HEMOGLOBIN A1C: Hgb A1c MFr Bld: 8.3 % — ABNORMAL HIGH (ref 4.6–6.5)

## 2021-02-09 ENCOUNTER — Telehealth: Payer: Self-pay | Admitting: Internal Medicine

## 2021-02-09 ENCOUNTER — Ambulatory Visit (INDEPENDENT_AMBULATORY_CARE_PROVIDER_SITE_OTHER): Payer: Medicare Other | Admitting: Pharmacist

## 2021-02-09 DIAGNOSIS — E7800 Pure hypercholesterolemia, unspecified: Secondary | ICD-10-CM | POA: Diagnosis not present

## 2021-02-09 DIAGNOSIS — I4891 Unspecified atrial fibrillation: Secondary | ICD-10-CM | POA: Diagnosis not present

## 2021-02-09 DIAGNOSIS — I10 Essential (primary) hypertension: Secondary | ICD-10-CM

## 2021-02-09 DIAGNOSIS — E1165 Type 2 diabetes mellitus with hyperglycemia: Secondary | ICD-10-CM

## 2021-02-09 NOTE — Chronic Care Management (AMB) (Signed)
Chronic Care Management Pharmacy Note  02/09/2021 Name:  Christine Robertson MRN:  [id number] DOB:  1955/11/08  Subjective: Christine Robertson is an 65 y.o. year old female who is a primary patient of Einar Pheasant, MD.  The CCM team was consulted for assistance with disease management and care coordination needs.    Engaged with patient by telephone for follow up visit in response to provider referral for pharmacy case management and/or care coordination services.   Consent to Services:  The patient was given information about Chronic Care Management services, agreed to services, and gave verbal consent prior to initiation of services.  Please see initial visit note for detailed documentation.   Patient Care Team: Einar Pheasant, MD as PCP - General (Internal Medicine) Einar Pheasant, MD (Internal Medicine) Bary Castilla Forest Gleason, MD (General Surgery) De Hollingshead, RPH-CPP (Pharmacist)  Recent office visits: None since our last call  Recent consult visits: None since our last call  Hospital visits: None in previous 6 months  Objective:  Lab Results  Component Value Date   CREATININE 0.77 02/08/2021   CREATININE 0.76 09/22/2020   CREATININE 0.68 05/10/2020    Lab Results  Component Value Date   HGBA1C 8.3 (H) 02/08/2021   Last diabetic Eye exam:  Lab Results  Component Value Date/Time   HMDIABEYEEXA No Retinopathy 01/14/2018 12:00 AM    Last diabetic Foot exam:  Lab Results  Component Value Date/Time   HMDIABFOOTEX on my exam.  10/04/2017 12:00 AM        Component Value Date/Time   CHOL 124 02/08/2021 0933   TRIG 194.0 (H) 02/08/2021 0933   HDL 52.10 02/08/2021 0933   CHOLHDL 2 02/08/2021 0933   VLDL 38.8 02/08/2021 0933   LDLCALC 33 02/08/2021 0933   LDLDIRECT 101.0 06/05/2018 0918    Hepatic Function Latest Ref Rng & Units 02/08/2021 09/22/2020 05/10/2020  Total Protein 6.0 - 8.3 g/dL 6.7 6.8 6.9  Albumin 3.5 - 5.2 g/dL 4.2 4.2 4.2  AST 0 - 37 U/L _0 ALT 0 - 35 U/L _1 Alk Phosphatase 39 - 117 U/L 59 48 51  Total Bilirubin 0.2 - 1.2 mg/dL 0.5 0.5 0.4  Bilirubin, Direct 0.0 - 0.3 mg/dL 0.1 0.1 0.1    Lab Results  Component Value Date/Time   TSH 0.71 02/08/2021 09:33 AM   TSH 0.87 10/13/2019 10:53 AM   FREET4 1.05 12/05/2012 10:27 AM    CBC Latest Ref Rng & Units 05/10/2020 02/11/2020 01/28/2020  WBC 4.0 - 10.5 K/uL 9.8 11.4(H) 12.1(H)  Hemoglobin 12.0 - 15.0 g/dL 13.2 13.2 13.4  Hematocrit 36.0 - 46.0 % 38.9 39.4 41.0  Platelets 150.0 - 400.0 K/uL 257.0 234.0 258.0    No results found for: VD25OH  Clinical ASCVD: No  The ASCVD Risk score Mikey Bussing DC Jr., et al., 2013) failed to calculate for the following reasons:   The valid total cholesterol range is 130 to 320 mg/dL    Social History   Tobacco Use  Smoking Status Never  Smokeless Tobacco Never   BP Readings from Last 3 Encounters:  09/22/20 126/70  08/03/20 126/72  07/14/20 128/75   Pulse Readings from Last 3 Encounters:  09/22/20 80  08/03/20 80  05/10/20 89   Wt Readings from Last 3 Encounters:  09/22/20 285 lb (129.3 kg)  08/03/20 290 lb 6.4 oz (131.7 kg)  07/14/20 299 lb (135.6 kg)    Assessment: Review of patient past medical  history, allergies, medications, health status, including review of consultants reports, laboratory and other test data, was performed as part of comprehensive evaluation and provision of chronic care management services.   SDOH:  (Social Determinants of Health) assessments and interventions performed:  SDOH Interventions    Flowsheet Row Most Recent Value  SDOH Interventions   Financial Strain Interventions Other (Comment)  [over income for patient assistance]       CCM Care Plan  Allergies  Allergen Reactions   Erythromycin Hives   Augmentin [Amoxicillin-Pot Clavulanate] Hives   Demerol [Meperidine] Nausea Only    Medications Reviewed Today     Reviewed by De Hollingshead, RPH-CPP (Pharmacist) on  02/09/21 at 23  Med List Status: <None>   Medication Order Taking? Sig Documenting Provider Last Dose Status Informant  Ascorbic Acid (VITAMIN C) 500 MG CAPS [id number] Yes Take 500 mg by mouth daily. [provider] Taking Active   aspirin 81 MG tablet [id number] Yes Take 81 mg by mouth daily. [provider] Taking Active Self  beclomethasone (QVAR) 40 MCG/ACT inhaler [id number] No Inhale 1 puff into the lungs 2 (two) times daily as needed.  Patient not taking: No sig reported   [provider] Not Taking Active   cetirizine (ZYRTEC) 10 MG tablet [id number] Yes Take 10 mg by mouth daily. [provider] Taking Active Self  flecainide (TAMBOCOR) 100 MG tablet [id number] Yes Take 100 mg by mouth 2 (two) times daily. [provider] Taking Active   hydrochlorothiazide (HYDRODIURIL) 25 MG tablet [id number] Yes TAKE ONE TABLET BY MOUTH DAILY Einar Pheasant, MD Taking Active   lisinopril (ZESTRIL) 20 MG tablet [id number] Yes TAKE ONE TABLET BY MOUTH DAILY Einar Pheasant, MD Taking Active   metFORMIN (GLUCOPHAGE) 1000 MG tablet [id number] Yes TAKE ONE TABLET BY MOUTH TWICE A DAY WITH MEALS Arnett, Yvetta Coder, FNP Taking Active   metoprolol tartrate (LOPRESSOR) 25 MG tablet [id number] Yes Take 25 mg by mouth 2 (two) times daily. Teodoro Spray, MD Taking Active   Multiple Vitamin (MULTIVITAMIN) tablet [id number] Yes Take 1 tablet by mouth daily. [provider] Taking Active Self  omeprazole (PRILOSEC) 20 MG capsule [id number] Yes TAKE 1 CAPSULE BY MOUTH TWICE DAILY Einar Pheasant, MD Taking Active            Med Note Nat Christen Nov 19, 2019 11:08 AM) Once daily   oxybutynin (DITROPAN) 5 MG tablet [id number] Yes TAKE ONE TABLET BY MOUTH TWICE A Maxcine Ham, Randell Patient, MD Taking Active   rosuvastatin (CRESTOR) 10 MG tablet [id number] Yes TAKE ONE TABLET BY MOUTH DAILY Einar Pheasant, MD Taking Active   Semaglutide, 2 MG/DOSE, (OZEMPIC, 2 MG/DOSE,)  8 MG/3ML SOPN [id number] Yes Inject 2 mg into the skin once a week. Einar Pheasant, MD Taking Active            Med Note Nat Christen Feb 09, 2021  2:19 PM) 1 mg weekly  triamcinolone cream (KENALOG) 0.1 % [id number] No Apply 1 application topically 2 (two) times daily.  Patient not taking: Reported on 02/09/2021   Einar Pheasant, MD Not Taking Active             Patient Active Problem List   Diagnosis Date Noted   Low back pain 05/10/2020   Right sided abdominal pain 05/10/2020   Back pain 10/06/2019   Lymphedema 01/23/2018   Chronic venous insufficiency 01/23/2018   Swelling of lower extremity 01/16/2018   SOB (  shortness of breath) on exertion 04/27/2017   Hand lesion 08/21/2015   Nasal congestion 08/21/2015   Left breast mass 03/29/2015   Abnormal mammogram of left breast 03/29/2015   Health care maintenance 03/14/2015   Foot pain, bilateral 06/25/2014   Fatigue 01/25/2014   Obstructive sleep apnea 01/25/2014   Rash 08/09/2013   Atrial fibrillation ([place]) 06/24/2013   Microalbuminuria 06/24/2013   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   Low TSH level 12/22/2012   Diabetes mellitus ([place]) 06/06/2012   Hypertension 06/06/2012   Hypercholesteremia 06/06/2012   Urinary incontinence 06/06/2012   GERD (gastroesophageal reflux disease) 06/06/2012    Immunization History  Administered Date(s) Administered   Fluad Quad(high Dose 65+) 05/10/2020   Influenza Split 06/06/2012, 06/03/2014   Influenza,inj,Quad PF,6+ Mos 06/23/2013, 05/18/2015, 07/16/2016, 07/03/2017   PFIZER(Purple Top)SARS-COV-2 Vaccination 10/16/2019, 11/11/2019   Pneumococcal Polysaccharide-23 08/31/2016   Zoster, Live 06/01/2015    Conditions to be addressed/monitored: Atrial Fibrillation, HTN, HLD, and DMII  Care Plan : Medication Management  Updates made by De Hollingshead, RPH-CPP since 02/09/2021 12:00 AM     Problem: Diabetes, HTN, AFib      Long-Range Goal: Disease Progression  Prevention   This Visit's Progress: On track  Recent Progress: On track  Priority: High  Note:   Current Barriers:  Unable to achieve control of diabetes   Pharmacist Clinical Goal(s):  Over the next 90 days, patient will verbalize ability to afford treatment regimen Over the next 90 days, patient will achieve control of diabetes as evidenced by A1c through collaboration with PharmD and provider.  Interventions: 1:1 collaboration with Einar Pheasant, MD regarding development and update of comprehensive plan of care as evidenced by provider attestation and co-signature Inter-disciplinary care team collaboration (see longitudinal plan of care) Comprehensive medication review performed; medication list updated in electronic medical record   SDOH: Getting ready to move to a 1 level condo;   Health Maintenance: Needs to schedule DM eye exam. Discussed today. Encouraged to get scheduled and have results sent to our office Reports she tripped over a rug today and fell. Landed on hip. Slight bruise. Did not hit head.    Diabetes: Uncontrolled but slowly improving; current treatment: metformin 1000 mg BID, Ozempic 1 mg weekly  - planned to increase to 2 mg, but issues at the pharmacy when it was first sent. She picked up another month of 1 mg. Has since picked up the 2 mg dose, but plans to complete her current 1 mg supply first.  Current glucose readings: fasting: 170-180s; afternoon readings: 60s Current meal patterns: breakfast: egg, piece of whole wheat toast, liver pudding; grilled chicken or egg biscuits; lunch: sandwich; supper: variable, eating out; chicken pita; drinks: water and diet coke Current physical activity: very little activity. Doesn't feel safe in gravel lot Discussed benefit of incorporating safe physical activity to improve glycemic control. Encouraged additional focus on portion sizes. Continue plan to increase Ozempic to 2 mg weekly when she completes current supply. As  patient is in the Medicare Coverage Gap, she is unable to afford the copay for an SGLT2. Could consider adding glipizide with supper, but would be cautious for increased risk of hypoglycemia and falls. Encouraged to check 2 hour post supper readings between now and PCP visit. If severely elevated, would consider cautious addition of glipizide.  Avoid pioglitazone given baseline risk of HF d/t AFib  Hyperlipidemia and ASCVD risk reducation: Controlled per last lipid panel; current treatment: rosuvastatin 10 mg daily   Recommended to  continue current regimen at this time. Reviewed lab results today.   Atrial Fibrillation with HTN: Appropriately managed; current rhythm control: flecainide 100 mg BID, current rate control: metoprolol tartrate 25 mg BID; anticoagulant treatment: none, Dr. Ubaldo Glassing elected to continue aspirin instead of DOAC; also on lisinopril 20 mg + HCTZ 25 mg for HTN Current home BP readings: has not been checking lately Reports some SOB, but with deconditioning. No palpitations or chest pain Recommended to current regimen with collaboration w/ cardiology at this time.   Overactive Bladder: Controlled per patient report, current treatment; oxybutynin 5 mg BID Consider change to XL formulation to reduce risk of anticholinergic side effects Previously recommended to continue current regimen at this time  Supplements/OTC: Multivitamin, Vitamin D, cetirizine  Patient Goals/Self-Care Activities Over the next 90 days, patient will:  - take medications as prescribed check glucose twice daily, document, and provide at future appointments check blood pressure periodically, document, and provide at future appointments  Follow Up Plan: Telephone follow up appointment with care management team member scheduled for: ~8 weeks      Medication Assistance:  Over income for assistance  Patient's preferred pharmacy is:  [facility] [place] [address] [place] [address] Phone: [phone] Fax: [phone]  Follow Up:  Patient agrees to Care Plan and Follow-up.   Plan: Telephone follow up appointment with care management team member scheduled for:  8 weeks  Catie Darnelle Maffucci, PharmD, [place], [place] Clinical Pharmacist [employer] at [employer] [phone]

## 2021-02-09 NOTE — Patient Instructions (Signed)
Visit Information   PATIENT GOALS:   Goals Addressed               This Visit's Progress     Patient Stated     Medication Monitoring (pt-stated)         Patient Goals/Self-Care Activities Over the next 90 days, patient will:  - take medications as prescribed check glucose twice daily, document, and provide at future appointments check blood pressure periodically, document, and provide at future appointments           Patient verbalizes understanding of instructions provided today and agrees to view in MyChart.   Plan: Telephone follow up appointment with care management team member scheduled for:  8 weeks  [name], PharmD, [place], CPP Clinical Pharmacist [employer] at [employer] [phone]

## 2021-02-09 NOTE — Telephone Encounter (Signed)
Received note from Catie that Ms Barillas fell recently.  Injured her hip.  Denies any head injury. Please call her and confirm she is doing ok.  Any residual pain or problems.

## 2021-02-10 ENCOUNTER — Ambulatory Visit: Payer: Medicare Other | Admitting: Internal Medicine

## 2021-02-10 NOTE — Telephone Encounter (Signed)
Patient confirmed no head injury. No residual issues. She is doing okay. She is going to continue to monitor and let us know if she needs anything. She has upcoming appt 6/30

## 2021-02-26 ENCOUNTER — Other Ambulatory Visit: Payer: Self-pay | Admitting: Internal Medicine

## 2021-03-02 ENCOUNTER — Ambulatory Visit (INDEPENDENT_AMBULATORY_CARE_PROVIDER_SITE_OTHER): Payer: Medicare Other | Admitting: Internal Medicine

## 2021-03-02 ENCOUNTER — Encounter: Payer: Self-pay | Admitting: Internal Medicine

## 2021-03-02 ENCOUNTER — Other Ambulatory Visit: Payer: Self-pay

## 2021-03-02 DIAGNOSIS — I4891 Unspecified atrial fibrillation: Secondary | ICD-10-CM | POA: Diagnosis not present

## 2021-03-02 DIAGNOSIS — I890 Lymphedema, not elsewhere classified: Secondary | ICD-10-CM

## 2021-03-02 DIAGNOSIS — K219 Gastro-esophageal reflux disease without esophagitis: Secondary | ICD-10-CM | POA: Diagnosis not present

## 2021-03-02 DIAGNOSIS — I10 Essential (primary) hypertension: Secondary | ICD-10-CM

## 2021-03-02 DIAGNOSIS — Z23 Encounter for immunization: Secondary | ICD-10-CM | POA: Diagnosis not present

## 2021-03-02 DIAGNOSIS — E7800 Pure hypercholesterolemia, unspecified: Secondary | ICD-10-CM

## 2021-03-02 DIAGNOSIS — G4733 Obstructive sleep apnea (adult) (pediatric): Secondary | ICD-10-CM

## 2021-03-02 DIAGNOSIS — E1165 Type 2 diabetes mellitus with hyperglycemia: Secondary | ICD-10-CM

## 2021-03-02 DIAGNOSIS — R198 Other specified symptoms and signs involving the digestive system and abdomen: Secondary | ICD-10-CM

## 2021-03-02 NOTE — Progress Notes (Addendum)
Patient ID: Christine Robertson, female   DOB: 05/22/1956, 65 y.o.   MRN: [id number]   Subjective:    Patient ID: Christine Robertson, female    DOB: 02/17/1956, 65 y.o.   MRN: [id number]  HPI This visit occurred during the SARS-CoV-2 public health emergency.  Safety protocols were in place, including screening questions prior to the visit, additional usage of staff PPE, and extensive cleaning of exam room while observing appropriate contact time as indicated for disinfecting solutions.   Patient here for a scheduled follow up.  Here to follow up regarding her blood sugars, cholesterol and blood pressure.  On ozempic 2mg  dose note.  [name] in the am and pm.  Recent a1c >8.  Discussed low carb diet and exercise.  [name].  [name].  No chest pain.  Breathing stable.  Some nausea reported after eating.  Also reports loose stool after eating.  Some discomfort.  Persistent intermittent issue for her.  No chest pain.  Breathing stable.  Handling stress.   Past Medical History:  Diagnosis Date   Abnormal results of thyroid function studies    Allergy    hay fever   Angina pectoris, unspecified (HCC)    Arthritis    Atrial fibrillation, unspecified    Chicken pox    Cold hands    Congenital anomaly of adrenal gland    Diabetes mellitus    type 2, uncomplicated   Essential hypertension, benign    GERD (gastroesophageal reflux disease)    IBS (irritable bowel syndrome)    Malaise    Morbid obesity (HCC)    Murmur    Pain in soft tissues of limb    Proteinuria    Pure hypercholesterolemia    Rash and other nonspecific skin eruption    Sleep apnea    Obstructive; Uses C-Pap machine   Urinary incontinence    Past Surgical History:  Procedure Laterality Date   ABDOMINAL HYSTERECTOMY  2010   BLADDER SURGERY  1967   stretching of bladder   BREAST BIOPSY Left 03/28/2015   done at Dr. Rutherford Robertson office. fibrocystic changes   COLONOSCOPY     COLONOSCOPY  WITH PROPOFOL N/A 09/09/2015   Procedure: COLONOSCOPY WITH PROPOFOL;  Surgeon: Scot Junobert T Elliott, MD;  [facility];  Service: Endoscopy;  Laterality: N/A;   ESOPHAGOGASTRODUODENOSCOPY     TONSILLECTOMY AND ADENOIDECTOMY  1959   URETHRAL DILATION     Family History  Problem Relation Age of Onset   Diabetes Mother    Cancer Father        lung   Breast cancer Maternal Grandmother [id number]   Breast cancer Maternal Aunt [id number]   Social History   Socioeconomic History   Marital status: Married    Spouse name: Not on file   Number of children: Not on file   Years of education: Not on file   Highest education level: Not on file  Occupational History   Not on file  Tobacco Use   Smoking status: Never   Smokeless tobacco: Never  Substance and Sexual Activity   Alcohol use: No    Alcohol/week: 0.0 standard drinks   Drug use: No   Sexual activity: Not on file  Other Topics Concern   Not on file  Social History Narrative   Not on file   Social Determinants of Health   Financial Resource Strain: Medium Risk   Difficulty of Paying Living Expenses: Somewhat hard  Food Insecurity: No Food Insecurity   Worried About Programme researcher, broadcasting/film/video in the Last Year: Never true   Ran Out of Food in the Last Year: Never true  Transportation Needs: No Transportation Needs   Lack of Transportation (Medical): No   Lack of Transportation (Non-Medical): No  Physical Activity: Unknown   Days of Exercise per Week: Not on file   Minutes of Exercise per Session: 0 min  Stress: No Stress Concern Present   Feeling of Stress : Not at all  Social Connections: Unknown   Frequency of Communication with Friends and Family: More than three times a week   Frequency of Social Gatherings with Friends and Family: More than three times a week   Attends Religious Services: Not on Scientist, clinical (histocompatibility and immunogenetics) or Organizations: Not on file   Attends Banker Meetings: Not on file   Marital Status: Not  on file    Review of Systems  Constitutional:  Negative for appetite change and unexpected weight change.  HENT:  Negative for congestion and sinus pressure.   Respiratory:  Negative for cough and chest tightness.        Breathing stable.   Cardiovascular:  Negative for chest pain and palpitations.       No increased leg swelling.   Gastrointestinal:  Negative for vomiting.       Nausea and abdominal discomfort as outlined.  Loose stool as outlined.   Genitourinary:  Negative for difficulty urinating and dysuria.  Musculoskeletal:  Negative for joint swelling and myalgias.  Skin:  Negative for color change and rash.  Neurological:  Negative for dizziness, light-headedness and headaches.  Psychiatric/Behavioral:  Negative for agitation and dysphoric mood.       Objective:    Physical Exam Vitals reviewed.  Constitutional:      General: She is not in acute distress.    Appearance: Normal appearance.  HENT:     Head: Normocephalic and atraumatic.     Right Ear: External ear normal.     Left Ear: External ear normal.  Eyes:     General: No scleral icterus.       Right eye: No discharge.        Left eye: No discharge.     Conjunctiva/sclera: Conjunctivae normal.  Neck:     Thyroid: No thyromegaly.  Cardiovascular:     Rate and Rhythm: Normal rate and regular rhythm.  Pulmonary:     Effort: No respiratory distress.     Breath sounds: Normal breath sounds. No wheezing.  Abdominal:     General: Bowel sounds are normal.     Palpations: Abdomen is soft.     Tenderness: There is no abdominal tenderness.  Musculoskeletal:        General: No tenderness.     Cervical back: Neck supple. No tenderness.     Comments: No increased lower extremity swelling.   Lymphadenopathy:     Cervical: No cervical adenopathy.  Skin:    Findings: No erythema or rash.  Neurological:     Mental Status: She is alert.  Psychiatric:        Mood and Affect: Mood normal.        Behavior: Behavior  normal.    BP 128/78   Pulse 80   Temp (!) 96.6 F (35.9 C) (Temporal)   Resp 17   Ht 5\' 4"  (1.626 m)   Wt 277 lb 3.2 oz (125.7 kg)   SpO2 92%  BMI 47.58 kg/m  Wt Readings from Last 3 Encounters:  03/02/21 277 lb 3.2 oz (125.7 kg)  09/22/20 285 lb (129.3 kg)  08/03/20 290 lb 6.4 oz (131.7 kg)    Outpatient Encounter Medications as of 03/02/2021  Medication Sig   Ascorbic Acid (VITAMIN C) 500 MG CAPS Take 500 mg by mouth daily.   aspirin 81 MG tablet Take 81 mg by mouth daily.   cetirizine (ZYRTEC) 10 MG tablet Take 10 mg by mouth daily.   flecainide (TAMBOCOR) 100 MG tablet Take 100 mg by mouth 2 (two) times daily.   hydrochlorothiazide (HYDRODIURIL) 25 MG tablet TAKE ONE TABLET BY MOUTH DAILY   lisinopril (ZESTRIL) 20 MG tablet TAKE ONE TABLET BY MOUTH DAILY   metFORMIN (GLUCOPHAGE) 1000 MG tablet TAKE ONE TABLET BY MOUTH TWICE A DAY WITH MEALS   metoprolol tartrate (LOPRESSOR) 25 MG tablet Take 25 mg by mouth 2 (two) times daily.   Multiple Vitamin (MULTIVITAMIN) tablet Take 1 tablet by mouth daily.   omeprazole (PRILOSEC) 20 MG capsule TAKE 1 CAPSULE BY MOUTH TWICE DAILY   oxybutynin (DITROPAN) 5 MG tablet TAKE ONE TABLET BY MOUTH TWICE A DAY   rosuvastatin (CRESTOR) 10 MG tablet TAKE ONE TABLET BY MOUTH DAILY   Semaglutide, 2 MG/DOSE, (OZEMPIC, 2 MG/DOSE,) 8 MG/3ML SOPN Inject 2 mg into the skin once a week.   [DISCONTINUED] beclomethasone (QVAR) 40 MCG/ACT inhaler Inhale 1 puff into the lungs 2 (two) times daily as needed. (Patient not taking: No sig reported)   [DISCONTINUED] triamcinolone cream (KENALOG) 0.1 % Apply 1 application topically 2 (two) times daily. (Patient not taking: No sig reported)   No facility-administered encounter medications on file as of 03/02/2021.     Lab Results  Component Value Date   WBC 9.8 05/10/2020   HGB 13.2 05/10/2020   HCT 38.9 05/10/2020   PLT 257.0 05/10/2020   GLUCOSE 182 (H) 02/08/2021   CHOL 124 02/08/2021   TRIG 194.0 (H)  02/08/2021   HDL 52.10 02/08/2021   LDLDIRECT 101.0 06/05/2018   LDLCALC 33 02/08/2021   ALT 26 02/08/2021   AST 18 02/08/2021   NA 140 02/08/2021   K 4.2 02/08/2021   CL 101 02/08/2021   CREATININE 0.77 02/08/2021   BUN 26 (H) 02/08/2021   CO2 29 02/08/2021   TSH 0.71 02/08/2021   INR 0.99 11/15/2012   HGBA1C 8.3 (H) 02/08/2021   MICROALBUR 2.2 (H) 09/22/2020    CT Abdomen Pelvis W Contrast  Result Date: 05/30/2020 CLINICAL DATA:  Right lower quadrant pain for several weeks EXAM: CT ABDOMEN AND PELVIS WITH CONTRAST TECHNIQUE: Multidetector CT imaging of the abdomen and pelvis was performed using the standard protocol following bolus administration of intravenous contrast. CONTRAST:  OMNIPAQUE IOHEXOL 350 MG/ML SOLN COMPARISON:  10/11/2005 FINDINGS: Lower chest: Tiny calcified granuloma is noted in the right lower lobe. Hepatobiliary: Fatty infiltration of the liver is noted. Gallbladder is unremarkable. Pancreas: Unremarkable. No pancreatic ductal dilatation or surrounding inflammatory changes. Spleen: Normal in size without focal abnormality. Adrenals/Urinary Tract: Bilobed left adrenal mass is again noted. The more superior component measures 3.1 cm in greatest dimension increased from the prior exam at which time it measured 2.4 cm. The overall length measures 4.3 cm also increased from the prior study. Right adrenal gland is within normal limits. Kidneys demonstrate a normal enhancement pattern bilaterally. No renal calculi or obstructive changes are seen. The bladder is decompressed. Stomach/Bowel: Scattered diverticular change is noted. No evidence of diverticulitis  is seen. The appendix is within normal limits. The stomach and small bowel are unremarkable. Vascular/Lymphatic: No significant vascular findings are present. No enlarged abdominal or pelvic lymph nodes. Reproductive: Status post hysterectomy. No adnexal masses. Other: No ascites is identified. Small fat containing  umbilical hernia is noted. Musculoskeletal: Degenerative changes of lumbar spine are noted. No acute bony abnormality is seen. IMPRESSION: Slight enlargement in left adrenal mass. 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 Small fat containing umbilical hernia. Chronic changes as described above without acute abnormality. Electronically Signed   By: Alcide Clever M.D.   On: 05/30/2020 10:49       Assessment & Plan:   Problem List Items Addressed This Visit     Atrial fibrillation (HCC)    On flecaininde, metoprolol and aspirin.  Stable.         Change in bowel movement    Persistent loose stool after eating as outlined.  Discussed fiber.  Probiotic.  Given persistent change, refer to GI for further evaluation.  Due colonoscopy.  Last 09/2015.  Recommended f/u in 5 years.         Relevant Orders   Ambulatory referral to Gastroenterology   Diabetes mellitus (HCC)    On ozempic and metformin.  Sugars as outlined.  Discussed diet and exercise.  [name].  Plans to start doing more.  Low carb diet.  Follow.  a1c 8.3.  [name] now.  Check and record sugars.  Send in readings.  Further adjustment based on sugar trends.        Relevant Orders   Hemoglobin A1c   GERD (gastroesophageal reflux disease)    Some nausea as outlined.  On prilosec.  Refer to GI as outlined.        Relevant Orders   Ambulatory referral to Gastroenterology   Hypercholesteremia    On crestor.  Low cholesterol diet and exercise.  Follow lipid panel and liver function tests.         Relevant Orders   Hepatic function panel   Lipid panel   Hypertension    Continue lisinopril, hctz and metoprolol.  Blood pressure doing well.  Continue current medication.  Follow pressures.  Follow metabolic panel.        Relevant Orders   CBC with Differential/Platelet   Basic metabolic panel   Lymphedema    Continue lymphedema pump.         Obstructive sleep apnea    Continue cpap.        Other Visit Diagnoses     Need for vaccination       Relevant Orders   Pneumococcal conjugate vaccine 20-valent (Prevnar 20) (Completed)        Dale [place], MD

## 2021-03-07 ENCOUNTER — Encounter: Payer: Self-pay | Admitting: Internal Medicine

## 2021-03-07 DIAGNOSIS — R198 Other specified symptoms and signs involving the digestive system and abdomen: Secondary | ICD-10-CM | POA: Insufficient documentation

## 2021-03-07 NOTE — Assessment & Plan Note (Signed)
Continue cpap.  

## 2021-03-07 NOTE — Addendum Note (Signed)
Addended by: Charm Barges on: 03/07/2021 03:22 AM   Modules accepted: Orders

## 2021-03-07 NOTE — Assessment & Plan Note (Signed)
On ozempic and metformin.  Sugars as outlined.  Discussed diet and exercise.  [name].  Plans to start doing more.  Low carb diet.  Follow.  a1c 8.3.  [name] now.  Check and record sugars.  Send in readings.  Further adjustment based on sugar trends.

## 2021-03-07 NOTE — Assessment & Plan Note (Signed)
Continue lymphedema pump.  

## 2021-03-07 NOTE — Assessment & Plan Note (Addendum)
Persistent loose stool after eating as outlined.  Discussed fiber.  Probiotic.  Given persistent change, refer to GI for further evaluation.  Due colonoscopy.  Last 09/2015.  Recommended f/u in 5 years.

## 2021-03-07 NOTE — Assessment & Plan Note (Signed)
Continue lisinopril, hctz and metoprolol.  Blood pressure doing well.  Continue current medication.  Follow pressures.  Follow metabolic panel.

## 2021-03-07 NOTE — Assessment & Plan Note (Signed)
Some nausea as outlined.  On prilosec.  Refer to GI as outlined.

## 2021-03-07 NOTE — Assessment & Plan Note (Signed)
On flecaininde, metoprolol and aspirin.  Stable.

## 2021-03-07 NOTE — Assessment & Plan Note (Signed)
On crestor.  Low cholesterol diet and exercise.  Follow lipid panel and liver function tests.   

## 2021-03-09 ENCOUNTER — Encounter: Payer: Self-pay | Admitting: Internal Medicine

## 2021-03-25 ENCOUNTER — Other Ambulatory Visit: Payer: Self-pay | Admitting: Internal Medicine

## 2021-04-15 ENCOUNTER — Other Ambulatory Visit: Payer: Self-pay | Admitting: Internal Medicine

## 2021-04-21 ENCOUNTER — Telehealth: Payer: Self-pay | Admitting: Internal Medicine

## 2021-04-21 ENCOUNTER — Ambulatory Visit: Payer: Medicare Other | Admitting: Pharmacist

## 2021-04-21 DIAGNOSIS — E7800 Pure hypercholesterolemia, unspecified: Secondary | ICD-10-CM

## 2021-04-21 DIAGNOSIS — E1165 Type 2 diabetes mellitus with hyperglycemia: Secondary | ICD-10-CM

## 2021-04-21 DIAGNOSIS — I10 Essential (primary) hypertension: Secondary | ICD-10-CM

## 2021-04-21 NOTE — Telephone Encounter (Signed)
Preparing sample. See Ccm documentation

## 2021-04-21 NOTE — Patient Instructions (Signed)
Visit Information  PATIENT GOALS:  Goals Addressed               This Visit's Progress     Patient Stated     Medication Monitoring (pt-stated)        Patient Goals/Self-Care Activities Over the next 90 days, patient will:  - take medications as prescribed check glucose twice daily, document, and provide at future appointments check blood pressure periodically, document, and provide at future appointments         Patient verbalizes understanding of instructions provided today and agrees to view in MyChart.   Plan: Telephone follow up appointment with care management team member scheduled for:  ~ 6 weeks  [name], PharmD, [place], CPP Clinical Pharmacist [employer] at [employer] [phone]

## 2021-04-21 NOTE — Chronic Care Management (AMB) (Signed)
Chronic Care Management Pharmacy Note  04/21/2021 Name:  Christine Robertson MRN:  [id number] DOB:  August 03, 1956  Subjective: Christine Robertson is an 65 y.o. year old female who is a primary patient of Einar Pheasant, MD.  The CCM team was consulted for assistance with disease management and care coordination needs.    Engaged with patient by telephone for  medication access  in response to provider referral for pharmacy case management and/or care coordination services.   Consent to Services:  The patient was given information about Chronic Care Management services, agreed to services, and gave verbal consent prior to initiation of services.  Please see initial visit note for detailed documentation.   Patient Care Team: Einar Pheasant, MD as PCP - General (Internal Medicine) Einar Pheasant, MD (Internal Medicine) Bary Castilla Forest Gleason, MD (General Surgery) De Hollingshead, RPH-CPP (Pharmacist)   Objective:  Lab Results  Component Value Date   CREATININE 0.77 02/08/2021   CREATININE 0.76 09/22/2020   CREATININE 0.68 05/10/2020    Lab Results  Component Value Date   HGBA1C 8.3 (H) 02/08/2021   Last diabetic Eye exam:  Lab Results  Component Value Date/Time   HMDIABEYEEXA No Retinopathy 01/14/2018 12:00 AM    Last diabetic Foot exam:  Lab Results  Component Value Date/Time   HMDIABFOOTEX on my exam.  10/04/2017 12:00 AM        Component Value Date/Time   CHOL 124 02/08/2021 0933   TRIG 194.0 (H) 02/08/2021 0933   HDL 52.10 02/08/2021 0933   CHOLHDL 2 02/08/2021 0933   VLDL 38.8 02/08/2021 0933   LDLCALC 33 02/08/2021 0933   LDLDIRECT 101.0 06/05/2018 0918    Hepatic Function Latest Ref Rng & Units 02/08/2021 09/22/2020 05/10/2020  Total Protein 6.0 - 8.3 g/dL 6.7 6.8 6.9  Albumin 3.5 - 5.2 g/dL 4.2 4.2 4.2  AST 0 - 37 U/L $Remo'18 20 27  'nlBjq$ ALT 0 - 35 U/L $Remo'26 24 30  'dwBTl$ Alk Phosphatase 39 - 117 U/L 59 48 51  Total Bilirubin 0.2 - 1.2 mg/dL 0.5 0.5 0.4  Bilirubin, Direct 0.0  - 0.3 mg/dL 0.1 0.1 0.1    Lab Results  Component Value Date/Time   TSH 0.71 02/08/2021 09:33 AM   TSH 0.87 10/13/2019 10:53 AM   FREET4 1.05 12/05/2012 10:27 AM    CBC Latest Ref Rng & Units 05/10/2020 02/11/2020 01/28/2020  WBC 4.0 - 10.5 K/uL 9.8 11.4(H) 12.1(H)  Hemoglobin 12.0 - 15.0 g/dL 13.2 13.2 13.4  Hematocrit 36.0 - 46.0 % 38.9 39.4 41.0  Platelets 150.0 - 400.0 K/uL 257.0 234.0 258.0    No results found for: VD25OH  Clinical ASCVD: No  The ASCVD Risk score Mikey Bussing DC Jr., et al., 2013) failed to calculate for the following reasons:   The valid total cholesterol range is 130 to 320 mg/dL      Social History   Tobacco Use  Smoking Status Never  Smokeless Tobacco Never   BP Readings from Last 3 Encounters:  03/07/21 128/78  09/22/20 126/70  08/03/20 126/72   Pulse Readings from Last 3 Encounters:  03/02/21 80  09/22/20 80  08/03/20 80   Wt Readings from Last 3 Encounters:  03/02/21 277 lb 3.2 oz (125.7 kg)  09/22/20 285 lb (129.3 kg)  08/03/20 290 lb 6.4 oz (131.7 kg)    Assessment: Review of patient past medical history, allergies, medications, health status, including review of consultants reports, laboratory and other test data, was performed as part of  comprehensive evaluation and provision of chronic care management services.   SDOH:  (Social Determinants of Health) assessments and interventions performed:    CCM Care Plan  Allergies  Allergen Reactions   Erythromycin Hives   Augmentin [Amoxicillin-Pot Clavulanate] Hives   Demerol [Meperidine] Nausea Only    Medications Reviewed Today     Reviewed by Einar Pheasant, MD (Physician) on 03/07/21 at [place] List Status: <None>   Medication Order Taking? Sig Documenting Provider Last Dose Status Informant  Ascorbic Acid (VITAMIN C) 500 MG CAPS [id number] Yes Take 500 mg by mouth daily. [provider] Taking Active   aspirin 81 MG tablet [id number] Yes Take 81 mg by mouth daily. [provider] Taking Active Self  cetirizine (ZYRTEC) 10 MG tablet [id number] Yes Take 10 mg by mouth daily. [provider] Taking Active Self  flecainide (TAMBOCOR) 100 MG tablet [id number] Yes Take 100 mg by mouth 2 (two) times daily. [provider] Taking Active   hydrochlorothiazide (HYDRODIURIL) 25 MG tablet [id number] Yes TAKE ONE TABLET BY MOUTH DAILY Einar Pheasant, MD Taking Active   lisinopril (ZESTRIL) 20 MG tablet [id number] Yes TAKE ONE TABLET BY MOUTH DAILY Einar Pheasant, MD Taking Active   metFORMIN (GLUCOPHAGE) 1000 MG tablet [id number] Yes TAKE ONE TABLET BY MOUTH TWICE A DAY WITH MEALS Arnett, Yvetta Coder, FNP Taking Active   metoprolol tartrate (LOPRESSOR) 25 MG tablet [id number] Yes Take 25 mg by mouth 2 (two) times daily. Teodoro Spray, MD Taking Active   Multiple Vitamin (MULTIVITAMIN) tablet [id number] Yes Take 1 tablet by mouth daily. [provider] Taking Active Self  omeprazole (PRILOSEC) 20 MG capsule [id number] Yes TAKE 1 CAPSULE BY MOUTH TWICE DAILY Einar Pheasant, MD Taking Active            Med Note Nat Christen Nov 19, 2019 11:08 AM) Once daily   oxybutynin (DITROPAN) 5 MG tablet [id number] Yes TAKE ONE TABLET BY MOUTH TWICE A Maxcine Ham, Randell Patient, MD Taking Active   rosuvastatin (CRESTOR) 10 MG tablet [id number] Yes TAKE ONE TABLET BY MOUTH DAILY Einar Pheasant, MD Taking Active   Semaglutide, 2 MG/DOSE, (OZEMPIC, 2 MG/DOSE,) 8 MG/3ML SOPN [id number] Yes Inject 2 mg into the skin once a week. Einar Pheasant, MD Taking Active            Med Note Nat Christen Feb 09, 2021  2:19 PM) 1 mg weekly            Patient Active Problem List   Diagnosis Date Noted   Change in bowel movement 03/07/2021   Low back pain 05/10/2020   Right sided abdominal pain 05/10/2020   Back pain 10/06/2019   Lymphedema 01/23/2018   Chronic venous insufficiency 01/23/2018   Swelling of lower extremity 01/16/2018   SOB (shortness of  breath) on exertion 04/27/2017   Hand lesion 08/21/2015   Nasal congestion 08/21/2015   Left breast mass 03/29/2015   Abnormal mammogram of left breast 03/29/2015   Health care maintenance 03/14/2015   Foot pain, bilateral 06/25/2014   Fatigue 01/25/2014   Obstructive sleep apnea 01/25/2014   Rash 08/09/2013   Atrial fibrillation ([employer]) 06/24/2013   Microalbuminuria 06/24/2013   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   Low TSH level 12/22/2012   Diabetes mellitus ([place]) 06/06/2012   Hypertension 06/06/2012   Hypercholesteremia 06/06/2012   Urinary incontinence 06/06/2012   GERD (gastroesophageal reflux disease) 06/06/2012    Immunization History  Administered Date(s)  Administered   Fluad Quad(high Dose 65+) 05/10/2020   Influenza Split 06/06/2012, 06/03/2014   Influenza,inj,Quad PF,6+ Mos 06/23/2013, 05/18/2015, 07/16/2016, 07/03/2017   PFIZER(Purple Top)SARS-COV-2 Vaccination 10/16/2019, 11/11/2019   PNEUMOCOCCAL CONJUGATE-20 03/02/2021   Pneumococcal Polysaccharide-23 08/31/2016   Zoster, Live 06/01/2015    Conditions to be addressed/monitored: HTN and DMII  Care Plan : Medication Management  Updates made by De Hollingshead, RPH-CPP since 04/21/2021 12:00 AM     Problem: Diabetes, HTN, AFib      Long-Range Goal: Disease Progression Prevention   Recent Progress: On track  Priority: High  Note:   Current Barriers:  Unable to achieve control of diabetes   Pharmacist Clinical Goal(s):  Over the next 90 days, patient will verbalize ability to afford treatment regimen Over the next 90 days, patient will achieve control of diabetes as evidenced by A1c through collaboration with PharmD and provider.  Interventions: 1:1 collaboration with Einar Pheasant, MD regarding development and update of comprehensive plan of care as evidenced by provider attestation and co-signature Inter-disciplinary care team collaboration (see longitudinal plan of care) Comprehensive medication  review performed; medication list updated in electronic medical record   SDOH: Getting ready to move to a 1 level condo;   Health Maintenance: Needs to schedule DM eye exam. Discussed today. Encouraged to get scheduled and have results sent to our office Reports she tripped over a rug today and fell. Landed on hip. Slight bruise. Did not hit head.    Diabetes: Uncontrolled but slowly improving; current treatment: metformin 1000 mg BID, Ozempic 2 mg weekly Calls today to report that her pharmacy was unable to get 2 mg supply in stock. Prepared sample for her today.   Hyperlipidemia and ASCVD risk reducation: Controlled per last lipid panel; current treatment: rosuvastatin 10 mg daily   Previously recommended to continue current regimen at this time.  Atrial Fibrillation with HTN: Appropriately managed; current rhythm control: flecainide 100 mg BID, current rate control: metoprolol tartrate 25 mg BID; anticoagulant treatment: none, Dr. Ubaldo Glassing elected to continue aspirin instead of DOAC; also on lisinopril 20 mg + HCTZ 25 mg for HTN Previously recommended to current regimen with collaboration w/ cardiology at this time.   Overactive Bladder: Controlled per patient report, current treatment; oxybutynin 5 mg BID Consider change to XL formulation to reduce risk of anticholinergic side effects Previously recommended to continue current regimen at this time  Supplements/OTC: Multivitamin, Vitamin D, cetirizine  Patient Goals/Self-Care Activities Over the next 90 days, patient will:  - take medications as prescribed check glucose twice daily, document, and provide at future appointments check blood pressure periodically, document, and provide at future appointments  Follow Up Plan: Telephone follow up appointment with care management team member scheduled for: ~6 weeks      Medication Assistance: None required.  Patient affirms current coverage meets needs.  Patient's preferred pharmacy  is:  [facility] [id number], [place] [address] Phone: [phone] Fax: [phone]   Follow Up:  Patient agrees to Care Plan and Follow-up.  Plan: Telephone follow up appointment with care management team member scheduled for:  ~ 6 weeks  Catie Darnelle Maffucci, PharmD, [place], [place] Clinical Pharmacist [employer] at [employer] [phone]

## 2021-04-21 NOTE — Telephone Encounter (Signed)
Patient called and is unable to get her Semaglutide, 2 MG/DOSE, (OZEMPIC, 2 MG/DOSE,) 8 MG/3ML SOPN, the pharmacy told her there is a Recruitment consultant. Patient would like a call back from Catie.

## 2021-04-21 NOTE — Telephone Encounter (Signed)
Medication Samples have been provided to the patient. She has picked up  Drug name: Ozempic       Strength: 8 mg/3 mL        Qty: 1 pen  LOT: [id number]  Exp.Date: 03/02/22

## 2021-05-08 ENCOUNTER — Other Ambulatory Visit: Payer: Self-pay | Admitting: Family

## 2021-05-11 ENCOUNTER — Ambulatory Visit (INDEPENDENT_AMBULATORY_CARE_PROVIDER_SITE_OTHER): Payer: Medicare Other | Admitting: Pharmacist

## 2021-05-11 ENCOUNTER — Telehealth: Payer: Self-pay | Admitting: Pharmacist

## 2021-05-11 DIAGNOSIS — E1165 Type 2 diabetes mellitus with hyperglycemia: Secondary | ICD-10-CM

## 2021-05-11 DIAGNOSIS — I4891 Unspecified atrial fibrillation: Secondary | ICD-10-CM

## 2021-05-11 DIAGNOSIS — E7800 Pure hypercholesterolemia, unspecified: Secondary | ICD-10-CM

## 2021-05-11 DIAGNOSIS — I10 Essential (primary) hypertension: Secondary | ICD-10-CM

## 2021-05-11 NOTE — Chronic Care Management (AMB) (Signed)
Chronic Care Management Pharmacy Note  05/11/2021 Name:  Christine Robertson MRN:  [id number] DOB:  Jun 12, 1956  Subjective: Christine Robertson is an 65 y.o. year old female who is a primary patient of Einar Pheasant, MD.  The CCM team was consulted for assistance with disease management and care coordination needs.    Engaged with patient by telephone for follow up visit in response to provider referral for pharmacy case management and/or care coordination services.   Consent to Services:  The patient was given information about Chronic Care Management services, agreed to services, and gave verbal consent prior to initiation of services.  Please see initial visit note for detailed documentation.   Patient Care Team: Einar Pheasant, MD as PCP - General (Internal Medicine) Einar Pheasant, MD (Internal Medicine) Bary Castilla Forest Gleason, MD (General Surgery) De Hollingshead, RPH-CPP (Pharmacist)   Objective:  Lab Results  Component Value Date   CREATININE 0.77 02/08/2021   CREATININE 0.76 09/22/2020   CREATININE 0.68 05/10/2020    Lab Results  Component Value Date   HGBA1C 8.3 (H) 02/08/2021   Last diabetic Eye exam:  Lab Results  Component Value Date/Time   HMDIABEYEEXA No Retinopathy 01/14/2018 12:00 AM    Last diabetic Foot exam:  Lab Results  Component Value Date/Time   HMDIABFOOTEX on my exam.  10/04/2017 12:00 AM        Component Value Date/Time   CHOL 124 02/08/2021 0933   TRIG 194.0 (H) 02/08/2021 0933   HDL 52.10 02/08/2021 0933   CHOLHDL 2 02/08/2021 0933   VLDL 38.8 02/08/2021 0933   LDLCALC 33 02/08/2021 0933   LDLDIRECT 101.0 06/05/2018 0918    Hepatic Function Latest Ref Rng & Units 02/08/2021 09/22/2020 05/10/2020  Total Protein 6.0 - 8.3 g/dL 6.7 6.8 6.9  Albumin 3.5 - 5.2 g/dL 4.2 4.2 4.2  AST 0 - 37 U/L _0 ALT 0 - 35 U/L _1 Alk Phosphatase 39 - 117 U/L 59 48 51  Total Bilirubin 0.2 - 1.2 mg/dL 0.5 0.5 0.4  Bilirubin, Direct 0.0 - 0.3  mg/dL 0.1 0.1 0.1    Lab Results  Component Value Date/Time   TSH 0.71 02/08/2021 09:33 AM   TSH 0.87 10/13/2019 10:53 AM   FREET4 1.05 12/05/2012 10:27 AM    CBC Latest Ref Rng & Units 05/10/2020 02/11/2020 01/28/2020  WBC 4.0 - 10.5 K/uL 9.8 11.4(H) 12.1(H)  Hemoglobin 12.0 - 15.0 g/dL 13.2 13.2 13.4  Hematocrit 36.0 - 46.0 % 38.9 39.4 41.0  Platelets 150.0 - 400.0 K/uL 257.0 234.0 258.0    No results found for: VD25OH  Clinical ASCVD: No  The ASCVD Risk score (Arnett DK, et al., 2019) failed to calculate for the following reasons:   The valid total cholesterol range is 130 to 320 mg/dL     Social History   Tobacco Use  Smoking Status Never  Smokeless Tobacco Never   BP Readings from Last 3 Encounters:  03/07/21 128/78  09/22/20 126/70  08/03/20 126/72   Pulse Readings from Last 3 Encounters:  03/02/21 80  09/22/20 80  08/03/20 80   Wt Readings from Last 3 Encounters:  03/02/21 277 lb 3.2 oz (125.7 kg)  09/22/20 285 lb (129.3 kg)  08/03/20 290 lb 6.4 oz (131.7 kg)    Assessment: Review of patient past medical history, allergies, medications, health status, including review of consultants reports, laboratory and other test data, was performed as part of comprehensive evaluation and  provision of chronic care management services.   SDOH:  (Social Determinants of Health) assessments and interventions performed:  SDOH Interventions    Flowsheet Row Most Recent Value  SDOH Interventions   Financial Strain Interventions Other (Comment)  [over income for patient assistance]       CCM Care Plan  Allergies  Allergen Reactions   Erythromycin Hives   Augmentin [Amoxicillin-Pot Clavulanate] Hives   Demerol [Meperidine] Nausea Only    Medications Reviewed Today     Reviewed by De Hollingshead, RPH-CPP (Pharmacist) on 05/11/21 at 1414  Med List Status: <None>   Medication Order Taking? Sig Documenting Provider Last Dose Status Informant  Ascorbic Acid  (VITAMIN C) 500 MG CAPS [id number] Yes Take 500 mg by mouth daily. [provider] Taking Active   aspirin 81 MG tablet [id number] Yes Take 81 mg by mouth daily. [provider] Taking Active Self  cetirizine (ZYRTEC) 10 MG tablet [id number] Yes Take 10 mg by mouth daily. [provider] Taking Active Self  flecainide (TAMBOCOR) 100 MG tablet [id number] Yes Take 100 mg by mouth 2 (two) times daily. [provider] Taking Active   hydrochlorothiazide (HYDRODIURIL) 25 MG tablet [id number] Yes TAKE ONE TABLET BY MOUTH DAILY Einar Pheasant, MD Taking Active   lisinopril (ZESTRIL) 20 MG tablet [id number] Yes TAKE ONE TABLET BY MOUTH DAILY Einar Pheasant, MD Taking Active   metFORMIN (GLUCOPHAGE) 1000 MG tablet [id number] Yes TAKE ONE TABLET BY MOUTH TWICE A DAY WITH MEALS Einar Pheasant, MD Taking Active   metoprolol tartrate (LOPRESSOR) 25 MG tablet [id number] Yes Take 25 mg by mouth 2 (two) times daily. Teodoro Spray, MD Taking Active   Multiple Vitamin (MULTIVITAMIN) tablet [id number] Yes Take 1 tablet by mouth daily. [provider] Taking Active Self  omeprazole (PRILOSEC) 20 MG capsule [id number] Yes TAKE 1 CAPSULE BY MOUTH TWICE DAILY Einar Pheasant, MD Taking Active            Med Note Nat Christen Nov 19, 2019 11:08 AM) Once daily   oxybutynin (DITROPAN) 5 MG tablet [id number] Yes TAKE ONE TABLET BY MOUTH TWICE A Maxcine Ham, Randell Patient, MD Taking Active   rosuvastatin (CRESTOR) 10 MG tablet [id number] Yes TAKE ONE TABLET BY MOUTH DAILY Einar Pheasant, MD Taking Active   Semaglutide, 2 MG/DOSE, (OZEMPIC, 2 MG/DOSE,) 8 MG/3ML SOPN [id number] Yes Inject 2 mg into the skin once a week. Einar Pheasant, MD Taking Active            Med Note Nat Christen May 11, 2021  2:12 PM)              Patient Active Problem List   Diagnosis Date Noted   Change in bowel movement 03/07/2021   Low back pain 05/10/2020   Right sided abdominal pain  05/10/2020   Back pain 10/06/2019   Lymphedema 01/23/2018   Chronic venous insufficiency 01/23/2018   Swelling of lower extremity 01/16/2018   SOB (shortness of breath) on exertion 04/27/2017   Hand lesion 08/21/2015   Nasal congestion 08/21/2015   Left breast mass 03/29/2015   Abnormal mammogram of left breast 03/29/2015   Health care maintenance 03/14/2015   Foot pain, bilateral 06/25/2014   Fatigue 01/25/2014   Obstructive sleep apnea 01/25/2014   Rash 08/09/2013   Atrial fibrillation ([place]) 06/24/2013   Microalbuminuria 06/24/2013   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   Low TSH level 12/22/2012   Diabetes mellitus ([place]) 06/06/2012  Hypertension 06/06/2012   Hypercholesteremia 06/06/2012   Urinary incontinence 06/06/2012   GERD (gastroesophageal reflux disease) 06/06/2012    Immunization History  Administered Date(s) Administered   Fluad Quad(high Dose 65+) 05/10/2020   Influenza Split 06/06/2012, 06/03/2014   Influenza,inj,Quad PF,6+ Mos 06/23/2013, 05/18/2015, 07/16/2016, 07/03/2017   PFIZER(Purple Top)SARS-COV-2 Vaccination 10/16/2019, 11/11/2019   PNEUMOCOCCAL CONJUGATE-20 03/02/2021   Pneumococcal Polysaccharide-23 08/31/2016   Zoster, Live 06/01/2015    Conditions to be addressed/monitored: Atrial Fibrillation, HLD, and DMII  Care Plan : Medication Management  Updates made by De Hollingshead, RPH-CPP since 05/11/2021 12:00 AM     Problem: Diabetes, HTN, AFib      Long-Range Goal: Disease Progression Prevention   This Visit's Progress: On track  Recent Progress: On track  Priority: High  Note:   Current Barriers:  Unable to achieve control of diabetes   Pharmacist Clinical Goal(s):  Over the next 90 days, patient will verbalize ability to afford treatment regimen Over the next 90 days, patient will achieve control of diabetes as evidenced by A1c through collaboration with PharmD and provider.  Interventions: 1:1 collaboration with Einar Pheasant, MD  regarding development and update of comprehensive plan of care as evidenced by provider attestation and co-signature Inter-disciplinary care team collaboration (see longitudinal plan of care) Comprehensive medication review performed; medication list updated in electronic medical record   SDOH: Just moved to a one level condo. Reports she has not sold her house yet, so stress.   Health Maintenance Yearly diabetic eye exam: due - reports she has an appointment scheduled in October Yearly diabetic foot exam: due - placed reminder in appointment notes Urine microalbumin: up to date Yearly influenza vaccination: due - encouraged to pursue at local pharmacy or our office next month Td/Tdap vaccination: due  - encouraged to pursue at local pharmacy Pneumonia vaccination: due - discuss with Dr. Nicki Reaper moving forward COVID vaccinations: due - encouraged bivalent booster dose Shingrix vaccinations: due - encouraged to pursue at local pharmacy Colonoscopy: due - reports upcoming appointment with GI Bone density scan: due - encouraged to discuss with Dr. Nicki Reaper  Mammogram: due- encouraged to discuss w/ Dr. Nicki Reaper   Diabetes: Uncontrolled but slowly improving; current treatment: metformin 1000 mg BID, Ozempic 2 mg weekly Unable to add SGLT2 due to additional brand copay.  Current glucose readings: fasting: 130-150s; 2 hour post prandial: 180-220s  Current meal patterns: eating out more lately (stress with moving and selling house); breakfast: Egg McMuffin, sometimes eggs, liver pudding, sometimes oatmeal, uses whole wheat bread, water or diet coke; lunch: leftovers, sometimes mashed potatoes; sandwich; supper: eating out more often lately. Plans to reduce eating out moving forward.  Current physical activity: walking in her house, has not been walking outside due to heat.  Reports her pharmacy still has not gotten Ozempic 2 mg in stock. Will prepare another sample to continue therapy.  Extensive dietary  discussion. Encouraged focus on reducing carbohydrate portion sizes. Encouraged to use post prandial readings to evaluate meals and how to improve moving forward.  Encouraged increased focus on physical activity as weather cools.  Moving forward, recommend changing Ozempic to [facility] when available on her plan. Discussed addition of glipizide with supper. Patient requests to defer addition of medication until next set of lab work is available.   Hyperlipidemia and ASCVD risk reducation: Controlled per last lipid panel; current treatment: rosuvastatin 10 mg daily   Current antiplatelet regimen: aspirin 81 mg daily  Recommended to continue current regimen at this time.  Atrial Fibrillation with HTN: Inappropriately managed; current rhythm control: flecainide 100 mg BID, current rate control: metoprolol tartrate 25 mg BID; anticoagulant treatment: none, Dr. Ubaldo Glassing elected to continue aspirin instead of DOAC due to low afib burden. Additional antihypertensives: lisinopril 20 mg daily, HCTZ 25 mg Denies symptoms of atrial fibrillation. CHADS2VASc = 4.  Home BP readings: 125-130/75-80 Continue to monitor for atrial fibrillation. Recommend consideration for DOAC moving forward. Recommended to current regimen with collaboration w/ cardiology at this time.   Overactive Bladder: Controlled per patient report, current treatment; oxybutynin 5 mg BID Consider change to XL formulation to reduce risk of anticholinergic side effects Previously recommended to continue current regimen at this time  Supplements/OTC: Multivitamin, Vitamin D, cetirizine  Patient Goals/Self-Care Activities Over the next 90 days, patient will:  - take medications as prescribed check glucose twice daily, document, and provide at future appointments check blood pressure periodically, document, and provide at future appointments  Follow Up Plan: Telephone follow up appointment with care management team member scheduled for: ~8  weeks      Medication Assistance: None required. Over income for assistance  Patient's preferred pharmacy is:  [facility] [id number], [place] [address] Phone: [phone] Fax: [phone]   Follow Up:  Patient agrees to Care Plan and Follow-up.  Plan: Telephone follow up appointment with care management team member scheduled for:  ~ 8 weeks  Catie Darnelle Maffucci, PharmD, [place], [place] Clinical Pharmacist [employer] at [employer] [phone]

## 2021-05-11 NOTE — Telephone Encounter (Signed)
Medication Samples have been labeled and logged for the patient. She plans to pick up next week  Drug name: Ozempic       Strength: 8 mg/64mL        Qty: 1 pen  LOT: [id number]  Exp.Date: 03/02/22  Dosing instructions: Inject 2 mg weekly  The patient has been instructed regarding the correct time, dose, and frequency of taking this medication, including desired effects and most common side effects.   Lourena Simmonds 2:50 PM 05/11/2021

## 2021-05-11 NOTE — Patient Instructions (Signed)
It was great to talk to you today!  We recommend you get your yearly influenza vaccine for this season. We recommend you pursue the updated bivalent COVID-19 booster, at least 2 months after any prior doses.   You are also due for:  1) Tdap (tetanus, diptheria, pertussis) vaccine 2) Shingrix (shingles) vaccine - two doses   You can get both of these at your local pharmacy.   Talk to Dr. Lorin Picket about your 1) bone density scan 2) mammogram and a 3) pneumonia vaccine when you are here for your appointment in October.   Take care!  [name], PharmD [phone]   Visit Information  PATIENT GOALS:  Goals Addressed               This Visit's Progress     Patient Stated     Medication Monitoring (pt-stated)        Patient Goals/Self-Care Activities Over the next 90 days, patient will:  - take medications as prescribed check glucose twice daily, document, and provide at future appointments check blood pressure periodically, document, and provide at future appointments        The patient verbalized understanding of instructions, educational materials, and care plan provided today and agreed to receive a mailed copy of patient instructions, educational materials, and care plan.   Plan: Telephone follow up appointment with care management team member scheduled for:  ~ 8 weeks  [name], PharmD, [place], CPP Clinical Pharmacist [employer] at [employer] [phone]

## 2021-05-16 NOTE — Telephone Encounter (Signed)
Patient was provided medication sample.

## 2021-05-17 ENCOUNTER — Other Ambulatory Visit: Payer: Self-pay | Admitting: Gastroenterology

## 2021-05-17 ENCOUNTER — Other Ambulatory Visit: Payer: Self-pay | Admitting: Internal Medicine

## 2021-05-17 DIAGNOSIS — R1084 Generalized abdominal pain: Secondary | ICD-10-CM

## 2021-05-17 DIAGNOSIS — Z1231 Encounter for screening mammogram for malignant neoplasm of breast: Secondary | ICD-10-CM

## 2021-05-26 ENCOUNTER — Ambulatory Visit: Payer: Medicare Other

## 2021-05-26 LAB — HM DIABETES EYE EXAM

## 2021-05-29 ENCOUNTER — Other Ambulatory Visit: Payer: Self-pay

## 2021-05-29 ENCOUNTER — Ambulatory Visit
Admission: RE | Admit: 2021-05-29 | Discharge: 2021-05-29 | Disposition: A | Discharge status: Home / Self Care | Payer: Medicare Other | Source: Ambulatory Visit | Attending: Internal Medicine | Admitting: Internal Medicine

## 2021-05-29 DIAGNOSIS — Z1231 Encounter for screening mammogram for malignant neoplasm of breast: Secondary | ICD-10-CM | POA: Diagnosis present

## 2021-06-01 ENCOUNTER — Other Ambulatory Visit: Payer: Self-pay

## 2021-06-01 ENCOUNTER — Other Ambulatory Visit (INDEPENDENT_AMBULATORY_CARE_PROVIDER_SITE_OTHER): Payer: Medicare Other

## 2021-06-01 DIAGNOSIS — E7800 Pure hypercholesterolemia, unspecified: Secondary | ICD-10-CM | POA: Diagnosis not present

## 2021-06-01 DIAGNOSIS — E1165 Type 2 diabetes mellitus with hyperglycemia: Secondary | ICD-10-CM

## 2021-06-01 DIAGNOSIS — I10 Essential (primary) hypertension: Secondary | ICD-10-CM

## 2021-06-01 LAB — HEPATIC FUNCTION PANEL
ALT: 25 U/L (ref 0–35)
AST: 18 U/L (ref 0–37)
Albumin: 4.2 g/dL (ref 3.5–5.2)
Alkaline Phosphatase: 46 U/L (ref 39–117)
Bilirubin, Direct: 0.1 mg/dL (ref 0.0–0.3)
Total Bilirubin: 0.5 mg/dL (ref 0.2–1.2)
Total Protein: 6.8 g/dL (ref 6.0–8.3)

## 2021-06-01 LAB — LIPID PANEL
Cholesterol: 136 mg/dL (ref 0–200)
HDL: 54.2 mg/dL (ref >39.00)
NonHDL: 81.95
Total CHOL/HDL Ratio: 3
Triglycerides: 213 mg/dL — ABNORMAL HIGH (ref 0.0–149.0)
VLDL: 42.6 mg/dL — ABNORMAL HIGH (ref 0.0–40.0)

## 2021-06-01 LAB — BASIC METABOLIC PANEL
BUN: 25 mg/dL — ABNORMAL HIGH (ref 6–23)
CO2: 27 mEq/L (ref 19–32)
Calcium: 10.1 mg/dL (ref 8.4–10.5)
Chloride: 100 mEq/L (ref 96–112)
Creatinine, Ser: 0.71 mg/dL (ref 0.40–1.20)
GFR: 88.15 mL/min (ref >60.00)
Glucose, Bld: 180 mg/dL — ABNORMAL HIGH (ref 70–99)
Potassium: 4.3 mEq/L (ref 3.5–5.1)
Sodium: 139 mEq/L (ref 135–145)

## 2021-06-01 LAB — CBC WITH DIFFERENTIAL/PLATELET
Basophils Absolute: 0.1 10*3/uL (ref 0.0–0.1)
Basophils Relative: 0.6 % (ref 0.0–3.0)
Eosinophils Absolute: 0.2 10*3/uL (ref 0.0–0.7)
Eosinophils Relative: 2 % (ref 0.0–5.0)
HCT: 40.8 % (ref 36.0–46.0)
Hemoglobin: 13.6 g/dL (ref 12.0–15.0)
Lymphocytes Relative: 18.7 % (ref 12.0–46.0)
Lymphs Abs: 2.2 10*3/uL (ref 0.7–4.0)
MCHC: 33.2 g/dL (ref 30.0–36.0)
MCV: 88.3 fl (ref 78.0–100.0)
Monocytes Absolute: 0.7 10*3/uL (ref 0.1–1.0)
Monocytes Relative: 5.5 % (ref 3.0–12.0)
Neutro Abs: 8.8 10*3/uL — ABNORMAL HIGH (ref 1.4–7.7)
Neutrophils Relative %: 73.2 % (ref 43.0–77.0)
Platelets: 234 10*3/uL (ref 150.0–400.0)
RBC: 4.62 Mil/uL (ref 3.87–5.11)
RDW: 13.2 % (ref 11.5–15.5)
WBC: 11.9 10*3/uL — ABNORMAL HIGH (ref 4.0–10.5)

## 2021-06-01 LAB — LDL CHOLESTEROL, DIRECT: Direct LDL: 58 mg/dL

## 2021-06-01 LAB — HEMOGLOBIN A1C: Hgb A1c MFr Bld: 8.1 % — ABNORMAL HIGH (ref 4.6–6.5)

## 2021-06-02 ENCOUNTER — Other Ambulatory Visit: Payer: Medicare Other

## 2021-06-02 ENCOUNTER — Encounter
Admission: RE | Admit: 2021-06-02 | Discharge: 2021-06-02 | Disposition: A | Discharge status: Home / Self Care | Payer: Medicare Other | Source: Ambulatory Visit | Attending: Gastroenterology | Admitting: Gastroenterology

## 2021-06-02 DIAGNOSIS — E1165 Type 2 diabetes mellitus with hyperglycemia: Secondary | ICD-10-CM

## 2021-06-02 DIAGNOSIS — R1084 Generalized abdominal pain: Secondary | ICD-10-CM | POA: Insufficient documentation

## 2021-06-02 DIAGNOSIS — I10 Essential (primary) hypertension: Secondary | ICD-10-CM | POA: Diagnosis not present

## 2021-06-02 DIAGNOSIS — E7800 Pure hypercholesterolemia, unspecified: Secondary | ICD-10-CM | POA: Diagnosis not present

## 2021-06-02 DIAGNOSIS — I4891 Unspecified atrial fibrillation: Secondary | ICD-10-CM | POA: Diagnosis not present

## 2021-06-02 MED ORDER — TECHNETIUM TC 99M SULFUR COLLOID
2.0000 | Freq: Once | INTRAVENOUS | Status: AC
  Administered 2021-06-02: 2.33 via ORAL

## 2021-06-05 ENCOUNTER — Encounter: Payer: Self-pay | Admitting: Internal Medicine

## 2021-06-05 ENCOUNTER — Ambulatory Visit (INDEPENDENT_AMBULATORY_CARE_PROVIDER_SITE_OTHER): Payer: Medicare Other | Admitting: Internal Medicine

## 2021-06-05 ENCOUNTER — Other Ambulatory Visit: Payer: Self-pay

## 2021-06-05 VITALS — BP 124/86 | HR 74 | Temp 96.9°F | Ht 64.02 in | Wt 278.2 lb

## 2021-06-05 DIAGNOSIS — I10 Essential (primary) hypertension: Secondary | ICD-10-CM | POA: Diagnosis not present

## 2021-06-05 DIAGNOSIS — G4733 Obstructive sleep apnea (adult) (pediatric): Secondary | ICD-10-CM

## 2021-06-05 DIAGNOSIS — Z23 Encounter for immunization: Secondary | ICD-10-CM

## 2021-06-05 DIAGNOSIS — E1165 Type 2 diabetes mellitus with hyperglycemia: Secondary | ICD-10-CM

## 2021-06-05 DIAGNOSIS — I4891 Unspecified atrial fibrillation: Secondary | ICD-10-CM | POA: Diagnosis not present

## 2021-06-05 DIAGNOSIS — R109 Unspecified abdominal pain: Secondary | ICD-10-CM

## 2021-06-05 DIAGNOSIS — I872 Venous insufficiency (chronic) (peripheral): Secondary | ICD-10-CM | POA: Diagnosis not present

## 2021-06-05 DIAGNOSIS — L089 Local infection of the skin and subcutaneous tissue, unspecified: Secondary | ICD-10-CM

## 2021-06-05 DIAGNOSIS — E7800 Pure hypercholesterolemia, unspecified: Secondary | ICD-10-CM

## 2021-06-05 DIAGNOSIS — K219 Gastro-esophageal reflux disease without esophagitis: Secondary | ICD-10-CM

## 2021-06-05 LAB — HM DIABETES FOOT EXAM

## 2021-06-05 MED ORDER — DOXYCYCLINE HYCLATE 100 MG PO TABS: 100.0000 mg | ORAL_TABLET | Freq: Two times a day (BID) | ORAL | 0 refills | Status: DC

## 2021-06-05 NOTE — Patient Instructions (Signed)
Take a probiotic daily while you are on antibiotics and for two weeks after completing antibiotics.    Examples of probiotics are align, culturelle or florastor.

## 2021-06-05 NOTE — Progress Notes (Signed)
Patient ID: Christine Robertson, female   DOB: 1955/09/09, 65 y.o.   MRN: [id number]   Subjective:    Patient ID: Christine Robertson, female    DOB: 14-Mar-1956, 65 y.o.   MRN: [id number]  This visit occurred during the SARS-CoV-2 public health emergency.  Safety protocols were in place, including screening questions prior to the visit, additional usage of staff PPE, and extensive cleaning of exam room while observing appropriate contact time as indicated for disinfecting solutions.   Patient here for a scheduled follow up.  Marland Kitchen   HPI Here to follow up regarding her blood sugar, cholesterol and hypertension.  She continues to have abdominal pain - occurs after eating.  Localized to mid abdominal pain.  Some diarrhea.  Saw GI.  Had gastric emptying study.  Study revealed:  initial emptying to 2 hours is within normal limits. The delayed 3 and 4 hour gastric emptying is less than expected. No chest pain.  No vomiting.  No cough or congestion.  Breathing stable.      Past Medical History:  Diagnosis Date   Abnormal results of thyroid function studies    Allergy    hay fever   Angina pectoris, unspecified (HCC)    Arthritis    Atrial fibrillation, unspecified    Chicken pox    Cold hands    Congenital anomaly of adrenal gland    Diabetes mellitus    type 2, uncomplicated   Essential hypertension, benign    GERD (gastroesophageal reflux disease)    IBS (irritable bowel syndrome)    Malaise    Morbid obesity (HCC)    Murmur    Pain in soft tissues of limb    Proteinuria    Pure hypercholesterolemia    Rash and other nonspecific skin eruption    Sleep apnea    Obstructive; Uses C-Pap machine   Urinary incontinence    Past Surgical History:  Procedure Laterality Date   ABDOMINAL HYSTERECTOMY  2010   BLADDER SURGERY  1967   stretching of bladder   BREAST BIOPSY Left 03/28/2015   done at Dr. Rutherford Nail office. fibrocystic changes   COLONOSCOPY     COLONOSCOPY WITH PROPOFOL N/A 09/09/2015    Procedure: COLONOSCOPY WITH PROPOFOL;  Surgeon: Scot Jun, MD;  Location: [facility] END[facility]COPY;  Service: Endoscopy;  Laterality: N/A;   ESOPHAGOGASTRODUODENOSCOPY     TONSILLECTOMY AND ADENOIDECTOMY  1959   URETHRAL DILATION     Family History  Problem Relation Age of Onset   Diabetes Mother    Cancer Father        lung   Breast cancer Maternal Grandmother 51   Breast cancer Maternal Aunt 72   Social History   Socioeconomic History   Marital status: Married    Spouse name: Not on file   Number of children: Not on file   Years of education: Not on file   Highest education level: Not on file  Occupational History   Not on file  Tobacco Use   Smoking status: Never   Smokeless tobacco: Never  Substance and Sexual Activity   Alcohol use: No    Alcohol/week: 0.0 standard drinks   Drug use: No   Sexual activity: Not on file  Other Topics Concern   Not on file  Social History Narrative   Not on file   Social Determinants of Health   Financial Resource Strain: Medium Risk   Difficulty of Paying Living Expenses: Somewhat hard  Food Insecurity: No Food  Insecurity   Worried About Programme researcher, broadcasting/film/video in the Last Year: Never true   Ran Out of Food in the Last Year: Never true  Transportation Needs: No Transportation Needs   Lack of Transportation (Medical): No   Lack of Transportation (Non-Medical): No  Physical Activity: Not on file  Stress: No Stress Concern Present   Feeling of Stress : Not at all  Social Connections: Unknown   Frequency of Communication with Friends and Family: More than three times a week   Frequency of Social Gatherings with Friends and Family: More than three times a week   Attends Religious Services: Not on file   Active Member of Clubs or Organizations: Not on file   Attends Banker Meetings: Not on file   Marital Status: Not on file     Review of Systems  Constitutional:  Negative for appetite change and unexpected weight change.   HENT:  Negative for congestion and sinus pressure.   Respiratory:  Negative for cough, chest tightness and shortness of breath.   Cardiovascular:  Negative for chest pain and palpitations.  Gastrointestinal:  Positive for abdominal pain and diarrhea. Negative for vomiting.  Genitourinary:  Negative for difficulty urinating and dysuria.  Musculoskeletal:  Negative for joint swelling and myalgias.  Skin:  Negative for color change and rash.  Neurological:  Negative for dizziness, light-headedness and headaches.  Psychiatric/Behavioral:  Negative for agitation and dysphoric mood.       Objective:     BP 124/86   Pulse 74   Temp (!) 96.9 F (36.1 C)   Ht 5' 4.02" (1.626 m)   Wt 278 lb 3.2 oz (126.2 kg)   SpO2 95%   BMI 47.73 kg/m  Wt Readings from Last 3 Encounters:  06/05/21 278 lb 3.2 oz (126.2 kg)  03/02/21 277 lb 3.2 oz (125.7 kg)  09/22/20 285 lb (129.3 kg)    Physical Exam Vitals reviewed.  Constitutional:      General: She is not in acute distress.    Appearance: Normal appearance.  HENT:     Head: Normocephalic and atraumatic.     Right Ear: External ear normal.     Left Ear: External ear normal.  Eyes:     General: No scleral icterus.       Right eye: No discharge.        Left eye: No discharge.     Conjunctiva/sclera: Conjunctivae normal.  Neck:     Thyroid: No thyromegaly.  Cardiovascular:     Rate and Rhythm: Normal rate and regular rhythm.  Pulmonary:     Effort: No respiratory distress.     Breath sounds: Normal breath sounds. No wheezing.  Abdominal:     General: Bowel sounds are normal.     Palpations: Abdomen is soft.  Musculoskeletal:        General: No swelling or tenderness.     Cervical back: Neck supple. No tenderness.  Lymphadenopathy:     Cervical: No cervical adenopathy.  Skin:    Findings: No erythema or rash.  Neurological:     Mental Status: She is alert.  Psychiatric:        Mood and Affect: Mood normal.        Behavior:  Behavior normal.     Outpatient Encounter Medications as of 06/05/2021  Medication Sig   Ascorbic Acid (VITAMIN C) 500 MG CAPS Take 500 mg by mouth daily.   aspirin 81 MG tablet Take 81 mg by  mouth daily.   cetirizine (ZYRTEC) 10 MG tablet Take 10 mg by mouth daily.   doxycycline (VIBRA-TABS) 100 MG tablet Take 1 tablet (100 mg total) by mouth 2 (two) times daily.   flecainide (TAMBOCOR) 100 MG tablet Take 100 mg by mouth 2 (two) times daily.   hydrochlorothiazide (HYDRODIURIL) 25 MG tablet TAKE ONE TABLET BY MOUTH DAILY   lisinopril (ZESTRIL) 20 MG tablet TAKE ONE TABLET BY MOUTH DAILY   metFORMIN (GLUCOPHAGE) 1000 MG tablet TAKE ONE TABLET BY MOUTH TWICE A DAY WITH MEALS   metoprolol tartrate (LOPRESSOR) 25 MG tablet Take 25 mg by mouth 2 (two) times daily.   Multiple Vitamin (MULTIVITAMIN) tablet Take 1 tablet by mouth daily.   omeprazole (PRILOSEC) 20 MG capsule TAKE 1 CAPSULE BY MOUTH TWICE DAILY   oxybutynin (DITROPAN) 5 MG tablet TAKE ONE TABLET BY MOUTH TWICE A DAY   rosuvastatin (CRESTOR) 10 MG tablet TAKE ONE TABLET BY MOUTH DAILY   Semaglutide, 2 MG/DOSE, (OZEMPIC, 2 MG/DOSE,) 8 MG/3ML SOPN Inject 2 mg into the skin once a week.   No facility-administered encounter medications on file as of 06/05/2021.     Lab Results  Component Value Date   WBC 11.9 (H) 06/01/2021   HGB 13.6 06/01/2021   HCT 40.8 06/01/2021   PLT 234.0 06/01/2021   GLUCOSE 180 (H) 06/01/2021   CHOL 136 06/01/2021   TRIG 213.0 (H) 06/01/2021   HDL 54.20 06/01/2021   LDLDIRECT 58.0 06/01/2021   LDLCALC 33 02/08/2021   ALT 25 06/01/2021   AST 18 06/01/2021   NA 139 06/01/2021   K 4.3 06/01/2021   CL 100 06/01/2021   CREATININE 0.71 06/01/2021   BUN 25 (H) 06/01/2021   CO2 27 06/01/2021   TSH 0.71 02/08/2021   INR 0.99 11/15/2012   HGBA1C 8.1 (H) 06/01/2021   MICROALBUR 2.2 (H) 09/22/2020    NM GASTRIC EMPTYING  Result Date: 06/03/2021 CLINICAL DATA:  Postprandial abdominal pain EXAM: NUCLEAR  MEDICINE GASTRIC EMPTYING SCAN TECHNIQUE: After oral ingestion of radiolabeled meal, sequential abdominal images were obtained for 4 hours. Percentage of activity emptying the stomach was calculated at 1 hour, 2 hour, 3 hour, and 4 hours. RADIOPHARMACEUTICALS:  2.33 mCi Tc-47m choose well in standardized meal COMPARISON:  None. FINDINGS: Expected location of the stomach in the left upper quadrant. Ingested meal empties the stomach gradually over the course of the study. 26% emptied at 1 hr ( normal >= 10%) 40% emptied at 2 hr ( normal >= 40%) 41% emptied at 3 hr ( normal >= 70%) 70% emptied at 4 hr ( normal >= 90%) IMPRESSION: Initial emptying to 2 hours is within normal limits. The delayed 3 and 4 hour gastric emptying is less than expected. Electronically Signed   By: Alcide Clever M.D.   On: 06/03/2021 02:27       Assessment & Plan:   Problem List Items Addressed This Visit     Abdominal pain    Mid abdominal pain - occurs after eating.  Previous abdominal ultrasound - no gallstones.  Previous EGD - ok.  Acid reflux appears to be controlled on PPI.  Recent GES - Initial emptying to 2 hours is within normal limits. The delayed 3 and 4 hour gastric emptying is less than expected.  Discussed possibility of ozempic - worsening symptoms.  Discussed trial off.  Reassess symptoms.  Consider possibility of HIDA scan if continued issues.  Seeing GI.  CCM referral as outlined.  Atrial fibrillation (HCC)    On flecaininde, metoprolol and aspirin.  Stable.        Chronic venous insufficiency    Lymphedema pump.       Diabetes mellitus (HCC)    Currently on ozempic and metformin.  GI symptoms as outlined.  Recent GES - Initial emptying to 2 hours - within normal limits. The delayed 3 and 4 hour gastric emptying is less than expected.  Discussed.  She raised concern about possible gallbladder issues.  Has had ultrasound that revealed no gallstones. Discussed possible Hida scan.  Discussed the  possibility of ozempic aggravating her symptoms.  Refer back to [facility] - to discuss options (jardiance). Cost may be an issue.   Recommend holding ozempic and see if symptoms improve.  Consider HIDA scan.  a1c 8.1.  If unable to afford SGLT2 inhibitor and if find GI symptoms aggravated by ozempic, may have to consider the possibility of insulin.         GERD (gastroesophageal reflux disease)    Reflux appears to be controlled on PPI.        Hypercholesteremia    On crestor.  Low cholesterol diet and exercise.  Follow lipid panel and liver function tests.        Hypertension    Continue lisinopril, hctz and metoprolol.  Blood pressure doing well.  Continue current medication.  Follow pressures.  Follow metabolic panel.       Obstructive sleep apnea    Continue cpap.       Toe infection    Great toe infection.  Doxycycline.  Call with update.  If persistent, may need podiatry evaluation.       Other Visit Diagnoses     Need for immunization against influenza    -  Primary   Relevant Orders   Flu Vaccine QUAD High Dose(Fluad) (Completed)        Dale Wimberley, MD

## 2021-06-09 ENCOUNTER — Telehealth: Payer: Self-pay | Admitting: Internal Medicine

## 2021-06-09 ENCOUNTER — Encounter: Payer: Self-pay | Admitting: Internal Medicine

## 2021-06-09 DIAGNOSIS — L089 Local infection of the skin and subcutaneous tissue, unspecified: Secondary | ICD-10-CM | POA: Insufficient documentation

## 2021-06-09 DIAGNOSIS — R109 Unspecified abdominal pain: Secondary | ICD-10-CM | POA: Insufficient documentation

## 2021-06-09 NOTE — Assessment & Plan Note (Signed)
Continue cpap.  

## 2021-06-09 NOTE — Telephone Encounter (Signed)
Please call pt and confirm her toe is doing ok.  I had placed her on doxycycline.  If persistent problems, will need referral to podiatry.  Also, I had discussed with her regarding further w/up for her abdominal pain (possible HIDA scan).  I would like to first have her hold ozempic.  See if symptoms improve.  Will hold on HIDA scan.  Also, needs referral to [facility] to see if SGLT2 medication is an option (due to cost).

## 2021-06-09 NOTE — Assessment & Plan Note (Signed)
On flecaininde, metoprolol and aspirin.  Stable.

## 2021-06-09 NOTE — Assessment & Plan Note (Signed)
Mid abdominal pain - occurs after eating.  Previous abdominal ultrasound - no gallstones.  Previous EGD - ok.  Acid reflux appears to be controlled on PPI.  Recent GES - Initial emptying to 2 hours iswithin normal limits. The delayed 3 and 4 hour gastric emptying is less than expected.  Discussed possibility of ozempic - worsening symptoms.  Discussed trial off.  Reassess symptoms.  Consider possibility of HIDA scan if continued issues.  Seeing GI.  CCM referral as outlined.

## 2021-06-09 NOTE — Assessment & Plan Note (Addendum)
Currently on ozempic and metformin.  GI symptoms as outlined.  Recent GES - Initial emptying to 2 hours -within normal limits. The delayed 3 and 4 hour gastric emptying is less than expected.  Discussed.  She raised concern about possible gallbladder issues.  Has had ultrasound that revealed no gallstones. Discussed possible Hida scan.  Discussed the possibility of ozempic aggravating her symptoms.  Refer back to [facility] - to discuss options (jardiance). Cost may be an issue.   Recommend holding ozempic and see if symptoms improve.  Consider HIDA scan.  a1c 8.1.  If unable to afford SGLT2 inhibitor and if find GI symptoms aggravated by ozempic, may have to consider the possibility of insulin.

## 2021-06-09 NOTE — Assessment & Plan Note (Signed)
Continue lisinopril, hctz and metoprolol.  Blood pressure doing well.  Continue current medication.  Follow pressures.  Follow metabolic panel.  

## 2021-06-09 NOTE — Assessment & Plan Note (Signed)
Reflux appears to be controlled on PPI.

## 2021-06-09 NOTE — Assessment & Plan Note (Signed)
Great toe infection.  Doxycycline.  Call with update.  If persistent, may need podiatry evaluation.

## 2021-06-09 NOTE — Assessment & Plan Note (Signed)
On crestor.  Low cholesterol diet and exercise.  Follow lipid panel and liver function tests.   

## 2021-06-09 NOTE — Assessment & Plan Note (Addendum)
Lymphedema pump 

## 2021-06-13 NOTE — Telephone Encounter (Signed)
I do want her to continue to hold ozempic.  Keep appt - CCM referral.

## 2021-06-13 NOTE — Telephone Encounter (Signed)
Patient says toe is better. She also noted that since being on the doxycycline, she was taking probiotic and has continued to take one daily- has helped her stomach significantly. Do you still want her to hold ozempic?

## 2021-06-14 ENCOUNTER — Ambulatory Visit: Payer: Medicare Other

## 2021-06-14 NOTE — Telephone Encounter (Signed)
Patient is aware 

## 2021-06-22 ENCOUNTER — Other Ambulatory Visit: Payer: Self-pay | Admitting: Internal Medicine

## 2021-06-26 ENCOUNTER — Ambulatory Visit: Payer: Medicare Other

## 2021-07-20 ENCOUNTER — Ambulatory Visit (INDEPENDENT_AMBULATORY_CARE_PROVIDER_SITE_OTHER): Payer: Medicare Other | Admitting: Pharmacist

## 2021-07-20 DIAGNOSIS — E7800 Pure hypercholesterolemia, unspecified: Secondary | ICD-10-CM

## 2021-07-20 DIAGNOSIS — E1165 Type 2 diabetes mellitus with hyperglycemia: Secondary | ICD-10-CM

## 2021-07-20 DIAGNOSIS — I4891 Unspecified atrial fibrillation: Secondary | ICD-10-CM

## 2021-07-20 MED ORDER — TRESIBA FLEXTOUCH 100 UNIT/ML ~~LOC~~ SOPN: PEN_INJECTOR | SUBCUTANEOUS | 2 refills | Status: DC

## 2021-07-20 MED ORDER — PEN NEEDLES 32G X 4 MM MISC: 3 refills | Status: DC

## 2021-07-20 NOTE — Patient Instructions (Signed)
Visit Information  Plan: Telephone follow up appointment with care management team member scheduled for:  4 weeks  [name], PharmD, [place], CPP Clinical Pharmacist [facility] at [facility] [phone]  Patient verbalizes understanding of instructions provided today and agrees to view in MyChart.

## 2021-07-20 NOTE — Chronic Care Management (AMB) (Signed)
Chronic Care Management CCM Pharmacy Note  07/20/2021 Name:  Christine Robertson MRN:  [id number] DOB:  [birth year]  Summary: - Patient self restarted Ozempic on Saturday. Reports improvement in GI symptoms separately with initiation of probiotic. Requests to continue Ozempic.   Recommendations/Changes made from today's visit: - Start Tresiba 10 units daily. Continue metformin 1000 mg twice daily. Will discuss Ozmepic therapy with PCP.  Subjective: Christine Robertson is an 64 y.o. year old female who is a primary patient of Dale Karnak, MD.  The CCM team was consulted for assistance with disease management and care coordination needs.    Engaged with patient by telephone for follow up visit for pharmacy case management and/or care coordination services.   Objective:  Medications Reviewed Today     Reviewed by Dale [place], MD (Physician) on 06/09/21 at 1915  Med List Status: <None>   Medication Order Taking? Sig Documenting Provider Last Dose Status Informant  Ascorbic Acid (VITAMIN C) 500 MG CAPS [id number] Yes Take 500 mg by mouth daily. [provider] Taking Active   aspirin 81 MG tablet [id number] Yes Take 81 mg by mouth daily. [provider] Taking Active Self  cetirizine (ZYRTEC) 10 MG tablet [id number] Yes Take 10 mg by mouth daily. [provider] Taking Active Self  doxycycline (VIBRA-TABS) 100 MG tablet [id number] Yes Take 1 tablet (100 mg total) by mouth 2 (two) times daily. Dale [place], MD  Active   flecainide [facility]) 100 MG tablet [id number] Yes Take 100 mg by mouth 2 (two) times daily. [provider] Taking Active   hydrochlorothiazide (HYDRODIURIL) 25 MG tablet [id number] Yes TAKE ONE TABLET BY MOUTH DAILY Dale [place], MD Taking Active   lisinopril (ZESTRIL) 20 MG tablet [id number] Yes TAKE ONE TABLET BY MOUTH DAILY Dale [place], MD Taking Active   metFORMIN (GLUCOPHAGE) 1000 MG tablet [id number] Yes TAKE ONE TABLET BY MOUTH  TWICE A DAY WITH MEALS Dale [place], MD Taking Active   metoprolol tartrate (LOPRESSOR) 25 MG tablet [id number] Yes Take 25 mg by mouth 2 (two) times daily. Dalia [place], MD Taking Active   Multiple Vitamin (MULTIVITAMIN) tablet [id number] Yes Take 1 tablet by mouth daily. [provider] Taking Active Self  omeprazole (PRILOSEC) 20 MG capsule [id number] Yes TAKE 1 CAPSULE BY MOUTH TWICE DAILY Dale [place], MD Taking Active            Med Note Tyrone Nine Nov 19, 2019 11:08 AM) Once daily   oxybutynin (DITROPAN) 5 MG tablet [id number] Yes TAKE ONE TABLET BY MOUTH TWICE A Ulice Dash, Westley Hummer, MD Taking Active   rosuvastatin (CRESTOR) 10 MG tablet [id number] Yes TAKE ONE TABLET BY MOUTH DAILY Dale [place], MD Taking Active   Semaglutide, 2 MG/DOSE, (OZEMPIC, 2 MG/DOSE,) 8 MG/3ML SOPN [id number] Yes Inject 2 mg into the skin once a week. Dale , MD Taking Active            Med Note Tyrone Nine May 11, 2021  2:12 PM)              Pertinent Labs:   Lab Results  Component Value Date   HGBA1C 8.1 (H) 06/01/2021   Lab Results  Component Value Date   CHOL 136 06/01/2021   HDL 54.20 06/01/2021   LDLCALC 33 02/08/2021   LDLDIRECT 58.0 06/01/2021   TRIG 213.0 (H) 06/01/2021   CHOLHDL 3 06/01/2021   Lab Results  Component Value Date   CREATININE  0.71 06/01/2021   BUN 25 (H) 06/01/2021   NA 139 06/01/2021   K 4.3 06/01/2021   CL 100 06/01/2021   CO2 27 06/01/2021    SDOH:  (Social Determinants of Health) assessments and interventions performed:  SDOH Interventions    Flowsheet Row Most Recent Value  SDOH Interventions   Financial Strain Interventions Intervention Not Indicated       CCM Care Plan  Review of patient past medical history, allergies, medications, health status, including review of consultants reports, laboratory and other test data, was performed as part of comprehensive evaluation and provision of chronic care  management services.   Care Plan : Medication Management  Updates made by De Hollingshead, RPH-CPP since 07/20/2021 12:00 AM     Problem: Diabetes, HTN, AFib      Long-Range Goal: Disease Progression Prevention   Recent Progress: On track  Priority: High  Note:   Current Barriers:  Unable to achieve control of diabetes   Pharmacist Clinical Goal(s):  Over the next 90 days, patient will verbalize ability to afford treatment regimen Over the next 90 days, patient will achieve control of diabetes as evidenced by A1c through collaboration with PharmD and provider.  Interventions: 1:1 collaboration with Einar Pheasant, MD regarding development and update of comprehensive plan of care as evidenced by provider attestation and co-signature Inter-disciplinary care team collaboration (see longitudinal plan of care) Comprehensive medication review performed; medication list updated in electronic medical record    Health Maintenance Yearly diabetic eye exam: up to date  Yearly diabetic foot exam: up to date  Urine microalbumin: up to date Yearly influenza vaccination: up to date  Td/Tdap vaccination: due  - previously encouraged to pursue at local pharmacy Pneumonia vaccination: up to date -  COVID vaccinations: due - encouraged bivalent booster dose Shingrix vaccinations: due - encouraged to pursue at local pharmacy Colonoscopy: due - reports upcoming appointment with GI Bone density scan: due - encouraged to discuss with Dr. Nicki Reaper  Mammogram: up to date  Diabetes: Uncontrolled; current treatment: metformin 1000 mg BID, Ozempic 2 mg weekly - she had stopped for ~3-4 weeks. Reported readings in the 200-300s and significant hunger, so she restarted Ozempic over the weekend. She reports significant improvement in her prior GI symptoms with addition of a probiotic. Gastric emptying study did show some decreased 3-4 hour emptying. She reports that GI physician was not concerned about  Ozempic contributing to her prior symptoms.  Unable to add SGLT2 due to additional brand copay.  Current glucose readings: fasting: 150-180; 2 hour post prandial: 200+  Current meal patterns: breakfast: english muffin, egg, canadian bacon; sometimes a bowel of cereal; lunch: sara lee bread sandwich; supper: spaghetti, grilled chicken, [place],  Current physical activity: walking in her house, has not been walking outside due to heat.  Will discuss patient restarting Ozempic w/ PCP and determine next steps.  Discussed uncontrolled diabetes prior to Ozempic discontinuation. Discussed addition of basal insulin vs glipizide with biggest meal. Patient amenable to start daily insulin injection. Start Tresiba 10 units once daily. Counseled on risk of hypoglycemia. Could consider pursuing CGM moving forward.   Hyperlipidemia and ASCVD risk reducation: Controlled per last lipid panel; current treatment: rosuvastatin 10 mg daily   Current antiplatelet regimen: aspirin 81 mg daily  Previously recommended to continue current regimen at this time.   Atrial Fibrillation with HTN: Inappropriately managed; current rhythm control: flecainide 100 mg BID, current rate control: metoprolol tartrate 25 mg BID; anticoagulant treatment: none, Dr.  Fath elected to continue aspirin instead of DOAC due to low afib burden. Additional antihypertensives: lisinopril 20 mg daily, HCTZ 25 mg Denies symptoms of atrial fibrillation. CHADS2VASc = 4.  Previously recommended to continue current regimen at this time. Recommend consideration for DOAC moving forward.  Overactive Bladder: Controlled per patient report, current treatment; oxybutynin 5 mg BID Moving forward, consider change to XL formulation to reduce risk of anticholinergic side effects Previously recommended to continue current regimen at this time  Supplements/OTC: Multivitamin, Vitamin D, cetirizine  Patient Goals/Self-Care Activities Over the next 90 days,  patient will:  - take medications as prescribed check glucose twice daily, document, and provide at future appointments check blood pressure periodically, document, and provide at future appointments      Plan: Telephone follow up appointment with care management team member scheduled for:  4 weeks  Catie Darnelle Maffucci, PharmD, [place], CPP Clinical Pharmacist [employer] at [employer] [phone]

## 2021-07-21 ENCOUNTER — Ambulatory Visit: Payer: Medicare Other | Admitting: Pharmacist

## 2021-07-21 DIAGNOSIS — E1165 Type 2 diabetes mellitus with hyperglycemia: Secondary | ICD-10-CM

## 2021-07-21 DIAGNOSIS — E7800 Pure hypercholesterolemia, unspecified: Secondary | ICD-10-CM

## 2021-07-21 DIAGNOSIS — I4891 Unspecified atrial fibrillation: Secondary | ICD-10-CM

## 2021-07-21 NOTE — Chronic Care Management (AMB) (Signed)
Chronic Care Management CCM Pharmacy Note  07/21/2021 Name:  Christine Robertson MRN:  [id number] DOB:  1955-10-21  Summary: - PCP recommended Ozempic discontinuatio  Recommendations/Changes made from today's visit: - Start Tresiba 10 units daily. Increase by 2 units every 3 days if fastings >130. Contact me if fastings >250   Subjective: Christine Robertson is an 65 y.o. year old female who is a primary patient of Einar Pheasant, MD.  The CCM team was consulted for assistance with disease management and care coordination needs.    Engaged with patient by telephone for  medication follow up  for pharmacy case management and/or care coordination services.   Objective:  Medications Reviewed Today     Reviewed by Einar Pheasant, MD (Physician) on 06/09/21 at [place] List Status: <None>   Medication Order Taking? Sig Documenting Provider Last Dose Status Informant  Ascorbic Acid (VITAMIN C) 500 MG CAPS [id number] Yes Take 500 mg by mouth daily. [provider] Taking Active   aspirin 81 MG tablet [id number] Yes Take 81 mg by mouth daily. [provider] Taking Active Self  cetirizine (ZYRTEC) 10 MG tablet [id number] Yes Take 10 mg by mouth daily. [provider] Taking Active Self  doxycycline (VIBRA-TABS) 100 MG tablet [id number] Yes Take 1 tablet (100 mg total) by mouth 2 (two) times daily. Einar Pheasant, MD  Active   flecainide [facility]) 100 MG tablet [id number] Yes Take 100 mg by mouth 2 (two) times daily. [provider] Taking Active   hydrochlorothiazide (HYDRODIURIL) 25 MG tablet [id number] Yes TAKE ONE TABLET BY MOUTH DAILY Einar Pheasant, MD Taking Active   lisinopril (ZESTRIL) 20 MG tablet [id number] Yes TAKE ONE TABLET BY MOUTH DAILY Einar Pheasant, MD Taking Active   metFORMIN (GLUCOPHAGE) 1000 MG tablet [id number] Yes TAKE ONE TABLET BY MOUTH TWICE A DAY WITH MEALS Einar Pheasant, MD Taking Active   metoprolol tartrate (LOPRESSOR) 25 MG tablet  [id number] Yes Take 25 mg by mouth 2 (two) times daily. Teodoro Spray, MD Taking Active   Multiple Vitamin (MULTIVITAMIN) tablet [id number] Yes Take 1 tablet by mouth daily. [provider] Taking Active Self  omeprazole (PRILOSEC) 20 MG capsule [id number] Yes TAKE 1 CAPSULE BY MOUTH TWICE DAILY Einar Pheasant, MD Taking Active            Med Note Nat Christen Nov 19, 2019 11:08 AM) Once daily   oxybutynin (DITROPAN) 5 MG tablet [id number] Yes TAKE ONE TABLET BY MOUTH TWICE A Maxcine Ham, Randell Patient, MD Taking Active   rosuvastatin (CRESTOR) 10 MG tablet [id number] Yes TAKE ONE TABLET BY MOUTH DAILY Einar Pheasant, MD Taking Active   Semaglutide, 2 MG/DOSE, (OZEMPIC, 2 MG/DOSE,) 8 MG/3ML SOPN [id number] Yes Inject 2 mg into the skin once a week. Einar Pheasant, MD Taking Active            Med Note Nat Christen May 11, 2021  2:12 PM)              Pertinent Labs:   Lab Results  Component Value Date   HGBA1C 8.1 (H) 06/01/2021   Lab Results  Component Value Date   CHOL 136 06/01/2021   HDL 54.20 06/01/2021   LDLCALC 33 02/08/2021   LDLDIRECT 58.0 06/01/2021   TRIG 213.0 (H) 06/01/2021   CHOLHDL 3 06/01/2021   Lab Results  Component Value Date   CREATININE 0.71 06/01/2021   BUN 25 (H) 06/01/2021   NA  139 06/01/2021   K 4.3 06/01/2021   CL 100 06/01/2021   CO2 27 06/01/2021    SDOH:  (Social Determinants of Health) assessments and interventions performed:    CCM Care Plan  Review of patient past medical history, allergies, medications, health status, including review of consultants reports, laboratory and other test data, was performed as part of comprehensive evaluation and provision of chronic care management services.   Care Plan : Medication Management  Updates made by Lourena Simmonds, RPH-CPP since 07/21/2021 12:00 AM     Problem: Diabetes, HTN, AFib      Long-Range Goal: Disease Progression Prevention   Recent Progress: On  track  Priority: High  Note:   Current Barriers:  Unable to achieve control of diabetes   Pharmacist Clinical Goal(s):  Over the next 90 days, patient will verbalize ability to afford treatment regimen Over the next 90 days, patient will achieve control of diabetes as evidenced by A1c through collaboration with PharmD and provider.  Interventions: 1:1 collaboration with Dale Trinity, MD regarding development and update of comprehensive plan of care as evidenced by provider attestation and co-signature Inter-disciplinary care team collaboration (see longitudinal plan of care) Comprehensive medication review performed; medication list updated in electronic medical record    Health Maintenance Yearly diabetic eye exam: up to date  Yearly diabetic foot exam: up to date  Urine microalbumin: up to date Yearly influenza vaccination: up to date  Td/Tdap vaccination: due  - previously encouraged to pursue at local pharmacy Pneumonia vaccination: up to date -  COVID vaccinations: due - encouraged bivalent booster dose Shingrix vaccinations: due - encouraged to pursue at local pharmacy Colonoscopy: due - reports upcoming appointment with GI Bone density scan: due - encouraged to discuss with Dr. Lorin Picket  Mammogram: up to date  Diabetes: Uncontrolled; current treatment: metformin 1000 mg BID, Ozempic 2 mg weekly - she had stopped for ~3-4 weeks. Reported readings in the 200-300s and significant hunger, so she restarted Ozempic over the weekend. She reports significant improvement in her prior GI symptoms with addition of a probiotic. Gastric emptying study did show some decreased 3-4 hour emptying. She reports that GI physician was not concerned about Ozempic contributing to her prior symptoms.  Unable to add SGLT2 due to additional brand copay.  Discussed w/ PCP. Stop Ozempic. Start Tresiba 10 units daily as discussed yesterday. Increase by 2 units every 3 days for fasting readings >130.  Patient instructed to contact me if fasting elevations >250 to allow for quicker titratoin  Hyperlipidemia and ASCVD risk reducation: Controlled per last lipid panel; current treatment: rosuvastatin 10 mg daily   Current antiplatelet regimen: aspirin 81 mg daily  Previously recommended to continue current regimen at this time.   Atrial Fibrillation with HTN: Inappropriately managed; current rhythm control: flecainide 100 mg BID, current rate control: metoprolol tartrate 25 mg BID; anticoagulant treatment: none, Dr. Lady Gary elected to continue aspirin instead of DOAC due to low afib burden. Additional antihypertensives: lisinopril 20 mg daily, HCTZ 25 mg Denies symptoms of atrial fibrillation. CHADS2VASc = 4.  Previously recommended to continue current regimen at this time. Recommend consideration for DOAC moving forward.  Overactive Bladder: Controlled per patient report, current treatment; oxybutynin 5 mg BID Moving forward, consider change to XL formulation to reduce risk of anticholinergic side effects Previously recommended to continue current regimen at this time  Supplements/OTC: Multivitamin, Vitamin D, cetirizine  Patient Goals/Self-Care Activities Over the next 90 days, patient will:  - take medications as  prescribed check glucose twice daily, document, and provide at future appointments check blood pressure periodically, document, and provide at future appointments      Plan: Telephone follow up appointment with care management team member scheduled for:  4 weeks  Catie Darnelle Maffucci, PharmD, [place], [place] Clinical Pharmacist [employer] at [employer] [phone]

## 2021-07-21 NOTE — Patient Instructions (Signed)
Visit Information  Plan: Telephone follow up appointment with care management team member scheduled for:  4 weeks  [name], PharmD, [place], CPP Clinical Pharmacist [facility] at [facility] [phone]  Patient verbalizes understanding of instructions provided today and agrees to view in MyChart.

## 2021-08-02 DIAGNOSIS — I4891 Unspecified atrial fibrillation: Secondary | ICD-10-CM

## 2021-08-02 DIAGNOSIS — E7800 Pure hypercholesterolemia, unspecified: Secondary | ICD-10-CM

## 2021-08-02 DIAGNOSIS — E1165 Type 2 diabetes mellitus with hyperglycemia: Secondary | ICD-10-CM | POA: Diagnosis not present

## 2021-08-07 ENCOUNTER — Other Ambulatory Visit (INDEPENDENT_AMBULATORY_CARE_PROVIDER_SITE_OTHER): Payer: Medicare Other

## 2021-08-07 ENCOUNTER — Other Ambulatory Visit: Payer: Self-pay

## 2021-08-07 ENCOUNTER — Telehealth: Payer: Self-pay | Admitting: Internal Medicine

## 2021-08-07 DIAGNOSIS — R300 Dysuria: Secondary | ICD-10-CM | POA: Diagnosis not present

## 2021-08-07 NOTE — Telephone Encounter (Signed)
Patient says it hurts when she urinates and having polyuria and was having right sided flank pain, patient ask for something until appt, advised OTC AZO . Patein scheduled for tomorrow at 330.

## 2021-08-07 NOTE — Telephone Encounter (Signed)
Spoken to patient, she awoken up today with right lower abdomen pain, pain during urination, and urinary frequency has increased. No sx of burning and fever/chills.Patient is trying to drink a lot of water to flush herself out. Patient has not taken any medication for UTI.

## 2021-08-07 NOTE — Telephone Encounter (Signed)
Patient thinks she has a UTI no available slots

## 2021-08-07 NOTE — Telephone Encounter (Signed)
Can you see if she can bring urine sample by today and schedule virtual visit tomorrow - work in for UTI.  See me if questions.

## 2021-08-08 ENCOUNTER — Other Ambulatory Visit: Payer: Self-pay

## 2021-08-08 ENCOUNTER — Encounter: Payer: Self-pay | Admitting: Internal Medicine

## 2021-08-08 ENCOUNTER — Ambulatory Visit: Payer: Medicare Other | Admitting: Pharmacist

## 2021-08-08 ENCOUNTER — Ambulatory Visit (INDEPENDENT_AMBULATORY_CARE_PROVIDER_SITE_OTHER): Payer: Medicare Other | Admitting: Internal Medicine

## 2021-08-08 DIAGNOSIS — I10 Essential (primary) hypertension: Secondary | ICD-10-CM

## 2021-08-08 DIAGNOSIS — I4891 Unspecified atrial fibrillation: Secondary | ICD-10-CM

## 2021-08-08 DIAGNOSIS — N3000 Acute cystitis without hematuria: Secondary | ICD-10-CM

## 2021-08-08 DIAGNOSIS — E7800 Pure hypercholesterolemia, unspecified: Secondary | ICD-10-CM

## 2021-08-08 DIAGNOSIS — E1165 Type 2 diabetes mellitus with hyperglycemia: Secondary | ICD-10-CM

## 2021-08-08 LAB — URINALYSIS, ROUTINE W REFLEX MICROSCOPIC
Bilirubin Urine: NEGATIVE
Ketones, ur: NEGATIVE
Nitrite: NEGATIVE
Specific Gravity, Urine: <=1.005 — AB (ref 1.000–1.030)
Total Protein, Urine: 30 — AB
Urine Glucose: NEGATIVE
Urobilinogen, UA: 0.2 (ref 0.0–1.0)
pH: 6 (ref 5.0–8.0)

## 2021-08-08 LAB — URINE CULTURE
MICRO NUMBER:: 12714392
SPECIMEN QUALITY:: ADEQUATE

## 2021-08-08 MED ORDER — NITROFURANTOIN MONOHYD MACRO 100 MG PO CAPS: 100.0000 mg | ORAL_CAPSULE | Freq: Two times a day (BID) | ORAL | 0 refills | Status: DC

## 2021-08-08 MED ORDER — METFORMIN HCL 1000 MG PO TABS: 1000.0000 mg | ORAL_TABLET | Freq: Two times a day (BID) | ORAL | 3 refills | Status: DC

## 2021-08-08 NOTE — Chronic Care Management (AMB) (Signed)
Chronic Care Management CCM Pharmacy Note  08/08/2021 Name:  Christine Robertson MRN:  [id number] DOB:  19-Nov-1955  Summary: - Received refill request for metformin  Recommendations/Changes made from today's visit: - [facility]ill [facility] to [facility]  Subjective: TAINA Robertson is an 65 y.o. year old female who is a primary patient of Einar Pheasant, MD.  The CCM team was consulted for assistance with disease management and care coordination needs.    Care coordination  for  medication management  for pharmacy case management and/or care coordination services.   Objective:  Medications Reviewed Today     Reviewed by Einar Pheasant, MD (Physician) on 06/09/21 at [place] List Status: <None>   Medication Order Taking? Sig Documenting Provider Last Dose Status Informant  Ascorbic Acid (VITAMIN C) 500 MG CAPS [id number] Yes Take 500 mg by mouth daily. [provider] Taking Active   aspirin 81 MG tablet [id number] Yes Take 81 mg by mouth daily. [provider] Taking Active Self  cetirizine (ZYRTEC) 10 MG tablet [id number] Yes Take 10 mg by mouth daily. [provider] Taking Active Self  doxycycline (VIBRA-TABS) 100 MG tablet [id number] Yes Take 1 tablet (100 mg total) by mouth 2 (two) times daily. Einar Pheasant, MD  Active   flecainide [facility]) 100 MG tablet [id number] Yes Take 100 mg by mouth 2 (two) times daily. [provider] Taking Active   hydrochlorothiazide (HYDRODIURIL) 25 MG tablet [id number] Yes TAKE ONE TABLET BY MOUTH DAILY Einar Pheasant, MD Taking Active   lisinopril (ZESTRIL) 20 MG tablet [id number] Yes TAKE ONE TABLET BY MOUTH DAILY Einar Pheasant, MD Taking Active   metFORMIN (GLUCOPHAGE) 1000 MG tablet [id number] Yes TAKE ONE TABLET BY MOUTH TWICE A DAY WITH MEALS Einar Pheasant, MD Taking Active   metoprolol tartrate (LOPRESSOR) 25 MG tablet [id number] Yes Take 25 mg by mouth 2 (two) times daily. Teodoro Spray, MD Taking Active    Multiple Vitamin (MULTIVITAMIN) tablet [id number] Yes Take 1 tablet by mouth daily. [provider] Taking Active Self  omeprazole (PRILOSEC) 20 MG capsule [id number] Yes TAKE 1 CAPSULE BY MOUTH TWICE DAILY Einar Pheasant, MD Taking Active            Med Note Nat Christen Nov 19, 2019 11:08 AM) Once daily   oxybutynin (DITROPAN) 5 MG tablet [id number] Yes TAKE ONE TABLET BY MOUTH TWICE A Maxcine Ham, Randell Patient, MD Taking Active   rosuvastatin (CRESTOR) 10 MG tablet [id number] Yes TAKE ONE TABLET BY MOUTH DAILY Einar Pheasant, MD Taking Active   Semaglutide, 2 MG/DOSE, (OZEMPIC, 2 MG/DOSE,) 8 MG/3ML SOPN [id number] Yes Inject 2 mg into the skin once a week. Einar Pheasant, MD Taking Active            Med Note Nat Christen May 11, 2021  2:12 PM)              Pertinent Labs:   Lab Results  Component Value Date   HGBA1C 8.1 (H) 06/01/2021   Lab Results  Component Value Date   CHOL 136 06/01/2021   HDL 54.20 06/01/2021   LDLCALC 33 02/08/2021   LDLDIRECT 58.0 06/01/2021   TRIG 213.0 (H) 06/01/2021   CHOLHDL 3 06/01/2021   Lab Results  Component Value Date   CREATININE 0.71 06/01/2021   BUN 25 (H) 06/01/2021   NA 139 06/01/2021   K 4.3 06/01/2021   CL 100 06/01/2021   CO2 27 06/01/2021  SDOH:  (Social Determinants of Health) assessments and interventions performed:  SDOH Interventions    Flowsheet Row Most Recent Value  SDOH Interventions   Financial Strain Interventions Intervention Not Indicated       CCM Care Plan  Review of patient past medical history, allergies, medications, health status, including review of consultants reports, laboratory and other test data, was performed as part of comprehensive evaluation and provision of chronic care management services.   Care Plan : Medication Management  Updates made by De Hollingshead, RPH-CPP since 08/08/2021 12:00 AM     Problem: Diabetes, HTN, AFib      Long-Range Goal:  Disease Progression Prevention   Recent Progress: On track  Priority: High  Note:   Current Barriers:  Unable to achieve control of diabetes   Pharmacist Clinical Goal(s):  Over the next 90 days, patient will verbalize ability to afford treatment regimen Over the next 90 days, patient will achieve control of diabetes as evidenced by A1c through collaboration with PharmD and provider.  Interventions: 1:1 collaboration with Einar Pheasant, MD regarding development and update of comprehensive plan of care as evidenced by provider attestation and co-signature Inter-disciplinary care team collaboration (see longitudinal plan of care) Comprehensive medication review performed; medication list updated in electronic medical record    Health Maintenance Yearly diabetic eye exam: up to date  Yearly diabetic foot exam: up to date  Urine microalbumin: up to date Yearly influenza vaccination: up to date  Td/Tdap vaccination: due  - previously encouraged to pursue at local pharmacy Pneumonia vaccination: up to date -  COVID vaccinations: due - encouraged bivalent booster dose Shingrix vaccinations: due - encouraged to pursue at local pharmacy Colonoscopy: due - reports upcoming appointment with GI Bone density scan: due - encouraged to discuss with Dr. Nicki Reaper  Mammogram: up to date  Diabetes: Uncontrolled; current treatment: metformin 1000 mg BID, Tresiba 10 units daily, increase by 2 units every 3 days for fastings >130 Gastric emptying study did show some decreased 3-4 hour emptying. She reports that GI physician was not concerned about Ozempic contributing to her prior symptoms.  Unable to add SGLT2 due to additional brand copay.  Refill request received for metformin. Script sent today.  Hyperlipidemia and ASCVD risk reducation: Controlled per last lipid panel; current treatment: rosuvastatin 10 mg daily   Current antiplatelet regimen: aspirin 81 mg daily  Previously recommended to  continue current regimen at this time.   Atrial Fibrillation with HTN: Inappropriately managed; current rhythm control: flecainide 100 mg BID, current rate control: metoprolol tartrate 25 mg BID; anticoagulant treatment: none, Dr. Ubaldo Glassing elected to continue aspirin instead of DOAC due to low afib burden. Additional antihypertensives: lisinopril 20 mg daily, HCTZ 25 mg Denies symptoms of atrial fibrillation. CHADS2VASc = 4.  Previously recommended to continue current regimen at this time. Recommend consideration for DOAC moving forward.  Overactive Bladder: Controlled per patient report, current treatment; oxybutynin 5 mg BID Moving forward, consider change to XL formulation to reduce risk of anticholinergic side effects Previously recommended to continue current regimen at this time  Supplements/OTC: Multivitamin, Vitamin D, cetirizine  Patient Goals/Self-Care Activities Over the next 90 days, patient will:  - take medications as prescribed check glucose twice daily, document, and provide at future appointments check blood pressure periodically, document, and provide at future appointments      Plan: Telephone follow up appointment with care management team member scheduled for:  2 weeks  Catie Darnelle Maffucci, PharmD, [place], [place] Pharmacist [employer]  at [facility] [phone]

## 2021-08-08 NOTE — Patient Instructions (Signed)
Metformin refill sent to [facility] today  Visit Information  Following are the goals we discussed today:  Patient Goals/Self-Care Activities Over the next 90 days, patient will:  - take medications as prescribed check glucose twice daily, document, and provide at future appointments check blood pressure periodically, document, and provide at future appointments        Plan: Telephone follow up appointment with care management team member scheduled for:  2 weeks   [name], PharmD, [place], CPP Clinical Pharmacist [facility] at [facility] [phone]   Please call the care guide team at [phone] if you need to cancel or reschedule your appointment.   The patient verbalized understanding of instructions, educational materials, and care plan provided today and declined offer to receive copy of patient instructions, educational materials, and care plan.

## 2021-08-08 NOTE — Progress Notes (Signed)
Patient ID: Christine Robertson, female   DOB: Oct 28, 1955, 65 y.o.   MRN: [id number]   Subjective:    Patient ID: Christine Robertson, female    DOB: Nov 18, 1955, 65 y.o.   MRN: [id number]  This visit occurred during the SARS-CoV-2 public health emergency.  Safety protocols were in place, including screening questions prior to the visit, additional usage of staff PPE, and extensive cleaning of exam room while observing appropriate contact time as indicated for disinfecting solutions.   Patient here for a work in appt.  Chief Complaint  Patient presents with   Acute Visit    Uti symptoms    .   HPI States starting two weeks ago, she noticed a "twinge".  Yesterday am - worsening dysuria.  Discomfort.  Right side/flank pain.  No fever.  Drinking water.  No vomiting.  Increased urinary frequency.  No hematuria.  No vaginal discharge.  Taking AZO.     Past Medical History:  Diagnosis Date   Abnormal results of thyroid function studies    Allergy    hay fever   Angina pectoris, unspecified (HCC)    Arthritis    Atrial fibrillation, unspecified    Chicken pox    Cold hands    Congenital anomaly of adrenal gland    Diabetes mellitus    type 2, uncomplicated   Essential hypertension, benign    GERD (gastroesophageal reflux disease)    IBS (irritable bowel syndrome)    Malaise    Morbid obesity (HCC)    Murmur    Pain in soft tissues of limb    Proteinuria    Pure hypercholesterolemia    Rash and other nonspecific skin eruption    Sleep apnea    Obstructive; Uses C-Pap machine   Urinary incontinence    Past Surgical History:  Procedure Laterality Date   ABDOMINAL HYSTERECTOMY  2010   BLADDER SURGERY  1967   stretching of bladder   BREAST BIOPSY Left 03/28/2015   done at Dr. Dwyane Luo office. fibrocystic changes   COLONOSCOPY     COLONOSCOPY WITH PROPOFOL N/A 09/09/2015   Procedure: COLONOSCOPY WITH PROPOFOL;  Surgeon: Manya Silvas, MD;  Location: [facility] ENDOSCOPY;  Service: Endoscopy;   Laterality: N/A;   ESOPHAGOGASTRODUODENOSCOPY     TONSILLECTOMY AND ADENOIDECTOMY  1959   URETHRAL DILATION     Family History  Problem Relation Age of Onset   Diabetes Mother    Cancer Father        lung   Breast cancer Maternal Grandmother 49   Breast cancer Maternal Aunt 79   Social History   Socioeconomic History   Marital status: Married    Spouse name: Not on file   Number of children: Not on file   Years of education: Not on file   Highest education level: Not on file  Occupational History   Not on file  Tobacco Use   Smoking status: Never   Smokeless tobacco: Never  Substance and Sexual Activity   Alcohol use: No    Alcohol/week: 0.0 standard drinks   Drug use: No   Sexual activity: Not on file  Other Topics Concern   Not on file  Social History Narrative   Not on file   Social Determinants of Health   Financial Resource Strain: Low Risk    Difficulty of Paying Living Expenses: Not hard at all  Food Insecurity: Not on file  Transportation Needs: Not on file  Physical Activity: Not on file  Stress: Not on file  Social Connections: Not on file     Review of Systems  Constitutional:  Negative for fever.       Tolerating po's.   HENT:  Negative for congestion and sinus pressure.   Respiratory:  Negative for cough, chest tightness and shortness of breath.   Cardiovascular:  Negative for chest pain and palpitations.  Gastrointestinal:  Negative for diarrhea and vomiting.       Right side/flank pain.   Genitourinary:  Positive for dysuria and frequency. Negative for hematuria.  Musculoskeletal:  Negative for joint swelling and myalgias.  Neurological:  Negative for dizziness and headaches.  Psychiatric/Behavioral:  Negative for agitation and dysphoric mood.       Objective:     BP 130/80   Pulse (!) 101   Temp 99.9 F (37.7 C) (Oral)   Ht 5\' 4"  (1.626 m)   Wt 283 lb (128.4 kg)   SpO2 94%   BMI 48.58 kg/m  Wt Readings from Last 3 Encounters:   08/08/21 283 lb (128.4 kg)  06/05/21 278 lb 3.2 oz (126.2 kg)  03/02/21 277 lb 3.2 oz (125.7 kg)    Physical Exam Vitals reviewed.  Constitutional:      General: She is not in acute distress.    Appearance: Normal appearance.  HENT:     Head: Normocephalic and atraumatic.     Right Ear: External ear normal.     Left Ear: External ear normal.  Eyes:     General: No scleral icterus.       Right eye: No discharge.        Left eye: No discharge.     Conjunctiva/sclera: Conjunctivae normal.  Neck:     Thyroid: No thyromegaly.  Cardiovascular:     Rate and Rhythm: Normal rate and regular rhythm.  Pulmonary:     Effort: No respiratory distress.     Breath sounds: Normal breath sounds. No wheezing.  Abdominal:     General: Bowel sounds are normal.     Palpations: Abdomen is soft.     Comments: Minimal increased pain - over bladder.    Musculoskeletal:        General: No swelling or tenderness.     Cervical back: Neck supple. No tenderness.  Lymphadenopathy:     Cervical: No cervical adenopathy.  Skin:    Findings: No erythema or rash.  Neurological:     Mental Status: She is alert.  Psychiatric:        Mood and Affect: Mood normal.        Behavior: Behavior normal.     Outpatient Encounter Medications as of 08/08/2021  Medication Sig   Ascorbic Acid (VITAMIN C) 500 MG CAPS Take 500 mg by mouth daily.   aspirin 81 MG tablet Take 81 mg by mouth daily.   cetirizine (ZYRTEC) 10 MG tablet Take 10 mg by mouth daily.   flecainide (TAMBOCOR) 100 MG tablet Take 100 mg by mouth 2 (two) times daily.   hydrochlorothiazide (HYDRODIURIL) 25 MG tablet TAKE ONE TABLET BY MOUTH DAILY   insulin degludec (TRESIBA FLEXTOUCH) 100 UNIT/ML FlexTouch Pen Inject 10 units daily. Titrate as instructed. Max daily dose 30 units.   Insulin Pen Needle (PEN NEEDLES) 32G X 4 MM MISC Use to inject insulin daily   lisinopril (ZESTRIL) 20 MG tablet TAKE ONE TABLET BY MOUTH DAILY   metFORMIN (GLUCOPHAGE)  1000 MG tablet Take 1 tablet (1,000 mg total) by mouth 2 (two) times daily  with a meal.   metoprolol tartrate (LOPRESSOR) 25 MG tablet Take 25 mg by mouth 2 (two) times daily.   Multiple Vitamin (MULTIVITAMIN) tablet Take 1 tablet by mouth daily.   nitrofurantoin, macrocrystal-monohydrate, (MACROBID) 100 MG capsule Take 1 capsule (100 mg total) by mouth 2 (two) times daily.   omeprazole (PRILOSEC) 20 MG capsule TAKE 1 CAPSULE BY MOUTH TWICE DAILY   oxybutynin (DITROPAN) 5 MG tablet TAKE ONE TABLET BY MOUTH TWICE A DAY   Probiotic Product (PROBIOTIC DAILY PO) Take 1 capsule by mouth daily.   rosuvastatin (CRESTOR) 10 MG tablet TAKE ONE TABLET BY MOUTH DAILY   No facility-administered encounter medications on file as of 08/08/2021.     Lab Results  Component Value Date   WBC 11.9 (H) 06/01/2021   HGB 13.6 06/01/2021   HCT 40.8 06/01/2021   PLT 234.0 06/01/2021   GLUCOSE 180 (H) 06/01/2021   CHOL 136 06/01/2021   TRIG 213.0 (H) 06/01/2021   HDL 54.20 06/01/2021   LDLDIRECT 58.0 06/01/2021   LDLCALC 33 02/08/2021   ALT 25 06/01/2021   AST 18 06/01/2021   NA 139 06/01/2021   K 4.3 06/01/2021   CL 100 06/01/2021   CREATININE 0.71 06/01/2021   BUN 25 (H) 06/01/2021   CO2 27 06/01/2021   TSH 0.71 02/08/2021   INR 0.99 11/15/2012   HGBA1C 8.1 (H) 06/01/2021   MICROALBUR 2.2 (H) 09/22/2020    NM GASTRIC EMPTYING  Result Date: 06/03/2021 CLINICAL DATA:  Postprandial abdominal pain EXAM: NUCLEAR MEDICINE GASTRIC EMPTYING SCAN TECHNIQUE: After oral ingestion of radiolabeled meal, sequential abdominal images were obtained for 4 hours. Percentage of activity emptying the stomach was calculated at 1 hour, 2 hour, 3 hour, and 4 hours. RADIOPHARMACEUTICALS:  2.33 mCi Tc-65m choose well in standardized meal COMPARISON:  None. FINDINGS: Expected location of the stomach in the left upper quadrant. Ingested meal empties the stomach gradually over the course of the study. 26% emptied at 1 hr ( normal  >= 10%) 40% emptied at 2 hr ( normal >= 40%) 41% emptied at 3 hr ( normal >= 70%) 70% emptied at 4 hr ( normal >= 90%) IMPRESSION: Initial emptying to 2 hours is within normal limits. The delayed 3 and 4 hour gastric emptying is less than expected. Electronically Signed   By: Inez Catalina M.D.   On: 06/03/2021 02:27       Assessment & Plan:   Problem List Items Addressed This Visit     Atrial fibrillation ([place])    On flecaininde, metoprolol and aspirin.  Stable.        Hypertension    Continue lisinopril, hctz and metoprolol.  Blood pressure doing well.  Continue current medication.  Follow pressures.       UTI (urinary tract infection)    Symptoms and urinalysis results appear to be c/w UTI.  Treat with macrobid as directed.  Stay hydrated. Monitor sugars.  Follow.  Urine culture pending.  If persistent increased pain or symptoms, needs to be reevaluated.        Relevant Medications   nitrofurantoin, macrocrystal-monohydrate, (MACROBID) 100 MG capsule     Einar Pheasant, MD

## 2021-08-13 ENCOUNTER — Encounter: Payer: Self-pay | Admitting: Internal Medicine

## 2021-08-13 DIAGNOSIS — N390 Urinary tract infection, site not specified: Secondary | ICD-10-CM | POA: Insufficient documentation

## 2021-08-13 NOTE — Assessment & Plan Note (Signed)
Continue lisinopril, hctz and metoprolol.  Blood pressure doing well.  Continue current medication.  Follow pressures.  

## 2021-08-13 NOTE — Assessment & Plan Note (Signed)
Symptoms and urinalysis results appear to be c/w UTI.  Treat with macrobid as directed.  Stay hydrated. Monitor sugars.  Follow.  Urine culture pending.  If persistent increased pain or symptoms, needs to be reevaluated.

## 2021-08-13 NOTE — Assessment & Plan Note (Signed)
On flecaininde, metoprolol and aspirin.  Stable.

## 2021-08-15 ENCOUNTER — Other Ambulatory Visit: Payer: Self-pay | Admitting: Internal Medicine

## 2021-08-17 ENCOUNTER — Ambulatory Visit: Payer: Medicare Other

## 2021-08-21 ENCOUNTER — Ambulatory Visit (INDEPENDENT_AMBULATORY_CARE_PROVIDER_SITE_OTHER): Payer: Medicare Other | Admitting: Pharmacist

## 2021-08-21 DIAGNOSIS — E1165 Type 2 diabetes mellitus with hyperglycemia: Secondary | ICD-10-CM

## 2021-08-21 DIAGNOSIS — I10 Essential (primary) hypertension: Secondary | ICD-10-CM

## 2021-08-21 DIAGNOSIS — E7800 Pure hypercholesterolemia, unspecified: Secondary | ICD-10-CM

## 2021-08-21 DIAGNOSIS — I4891 Unspecified atrial fibrillation: Secondary | ICD-10-CM

## 2021-08-21 MED ORDER — METFORMIN HCL ER 500 MG PO TB24: 1000.0000 mg | ORAL_TABLET | Freq: Two times a day (BID) | ORAL | 1 refills | Status: DC

## 2021-08-21 MED ORDER — TRESIBA FLEXTOUCH 100 UNIT/ML ~~LOC~~ SOPN: PEN_INJECTOR | SUBCUTANEOUS | 2 refills | Status: DC

## 2021-08-21 NOTE — Patient Instructions (Signed)
Christine Robertson,   Increase Christine Robertson to 22 units daily. Stop the immediate release metformin, start metformin XR 500 mg, take 2 tablets twice daily.   We recommend everybody get the updated bivalent COVID-19 booster, at least 2 months after any prior doses. You may consider delaying a booster dose by 3 months from a prior episode of COVID-19 per the CDC. Let us know if you have questions.   In 2022, the Shingrix (shingles) and Tdap (tetanus, diptheria, and pertussis) vaccines will have a $0 copay at the pharmacy. If you would prefer, you can call the [facility] at [phone] to receive there.   Talk to Dr. Lorin Picket about a bone density scan at your next appointment.   Take care!  Christine Robertson, PharmD  Visit Information  Following are the goals we discussed today:  Patient Goals/Self-Care Activities Over the next 90 days, patient will:  - take medications as prescribed check glucose twice daily, document, and provide at future appointments check blood pressure periodically, document, and provide at future appointments        Plan: Telephone follow up appointment with care management team member scheduled for:  2 months   Christine Robertson, PharmD, [place], CPP Clinical Pharmacist [facility] [phone]   Please call the care guide team at [phone] if you need to cancel or reschedule your appointment.   Patient verbalizes understanding of instructions provided today and agrees to view in MyChart.

## 2021-08-21 NOTE — Chronic Care Management (AMB) (Signed)
Chronic Care Management CCM Pharmacy Note  08/21/2021 Name:  Christine Robertson MRN:  [id number] DOB:  [birth year]  Summary: - Glucose elevated. Still having bowel urgency without Ozempic.   Recommendations/Changes made from today's visit: - Switch metformin IR to XR to determine if impact on bowels.  - Increase Tresiba to 22 units daily  - Reviewed vaccine and screening recommendations  Subjective: Christine Robertson is an 66 y.o. year old female who is a primary patient of Dale Nuckolls, MD.  The CCM team was consulted for assistance with disease management and care coordination needs.    Engaged with patient by telephone for follow up visit for pharmacy case management and/or care coordination services.   Objective:  Medications Reviewed Today     Reviewed by Lourena Simmonds, RPH-CPP (Pharmacist) on 08/21/21 at 1507  Med List Status: <None>   Medication Order Taking? Sig Documenting Provider Last Dose Status Informant  Ascorbic Acid (VITAMIN C) 1000 MG tablet [id number] Yes Take 1,000 mg by mouth daily. [provider] Taking Active   aspirin 81 MG tablet [id number] Yes Take 81 mg by mouth daily. [provider] Taking Active Self  cetirizine (ZYRTEC) 10 MG tablet [id number] Yes Take 10 mg by mouth daily. [provider] Taking Active Self  flecainide (TAMBOCOR) 100 MG tablet [id number] Yes Take 100 mg by mouth 2 (two) times daily. [provider] Taking Active   hydrochlorothiazide (HYDRODIURIL) 25 MG tablet [id number] Yes TAKE ONE TABLET BY MOUTH DAILY Dale [place], MD Taking Active   insulin degludec (TRESIBA FLEXTOUCH) 100 UNIT/ML FlexTouch Pen [id number] Yes Inject 10 units daily. Titrate as instructed. Max daily dose 30 units. Dale [place], MD Taking Active            Med Note Ara Kussmaul Aug 21, 2021  3:00 PM) 18 units  Insulin Pen Needle (PEN NEEDLES) 32G X 4 MM MISC [id number]  Use to inject insulin daily Dale [place],  MD  Active   lisinopril (ZESTRIL) 20 MG tablet [id number] Yes TAKE ONE TABLET BY MOUTH DAILY Dale [place], MD Taking Active   Discontinued 08/21/21 1505   metoprolol tartrate (LOPRESSOR) 25 MG tablet [id number] Yes Take 25 mg by mouth 2 (two) times daily. Dalia Heading, MD Taking Active   Multiple Vitamin (MULTIVITAMIN) tablet [id number] Yes Take 1 tablet by mouth daily. [provider] Taking Active Self  omeprazole (PRILOSEC) 20 MG capsule [id number] Yes TAKE 1 CAPSULE BY MOUTH TWICE DAILY Dale [place]royo, MD Taking Active            Med Note Tyrone Nine Nov 19, 2019 11:08 AM) Once daily   oxybutynin (DITROPAN) 5 MG tablet [id number] Yes TAKE ONE TABLET BY MOUTH TWICE A Donne Anon, MD Taking Active   Probiotic Product (PROBIOTIC DAILY PO) [id number] Yes Take 1 capsule by mouth daily. [provider] Taking Active   rosuvastatin (CRESTOR) 10 MG tablet [id number] Yes TAKE ONE TABLET BY MOUTH DAILY Dale [place], MD Taking Active             Pertinent Labs:   Lab Results  Component Value Date   HGBA1C 8.1 (H) 06/01/2021   Lab Results  Component Value Date   CHOL 136 06/01/2021   HDL 54.20 06/01/2021   LDLCALC 33 02/08/2021   LDLDIRECT 58.0 06/01/2021   TRIG 213.0 (H) 06/01/2021   CHOLHDL 3 06/01/2021   Lab Results  Component Value Date   CREATININE 0.71  06/01/2021   BUN 25 (H) 06/01/2021   NA 139 06/01/2021   K 4.3 06/01/2021   CL 100 06/01/2021   CO2 27 06/01/2021    SDOH:  (Social Determinants of Health) assessments and interventions performed:  SDOH Interventions    Flowsheet Row Most Recent Value  SDOH Interventions   Financial Strain Interventions Intervention Not Indicated       CCM Care Plan  Review of patient past medical history, allergies, medications, health status, including review of consultants reports, laboratory and other test data, was performed as part of comprehensive evaluation and provision of chronic  care management services.   Care Plan : Medication Management  Updates made by De Hollingshead, RPH-CPP since 08/21/2021 12:00 AM     Problem: Diabetes, HTN, AFib      Long-Range Goal: Disease Progression Prevention   Recent Progress: On track  Priority: High  Note:   Current Barriers:  Unable to achieve control of diabetes   Pharmacist Clinical Goal(s):  Over the next 90 days, patient will verbalize ability to afford treatment regimen Over the next 90 days, patient will achieve control of diabetes as evidenced by A1c through collaboration with PharmD and provider.  Interventions: 1:1 collaboration with Einar Pheasant, MD regarding development and update of comprehensive plan of care as evidenced by provider attestation and co-signature Inter-disciplinary care team collaboration (see longitudinal plan of care) Comprehensive medication review performed; medication list updated in electronic medical record    Health Maintenance Yearly diabetic eye exam: up to date  Yearly diabetic foot exam: up to date  Urine microalbumin: up to date Yearly influenza vaccination: up to date  Td/Tdap vaccination: due  - previously encouraged to pursue at local pharmacy in 2023 Pneumonia vaccination: up to date  COVID vaccinations: due - encouraged bivalent booster dose, patient will consider Shingrix vaccinations: due - encouraged to pursue at local pharmacy in 2023 Colonoscopy: up to date - reports GI provider noted she would wait 10 years for next colonoscopy. Noted in his last visit. Will discuss updated HM with PCP and LPN Bone density scan: due - encouraged to discuss with Dr. Nicki Reaper  Mammogram: up to date  Diabetes: Uncontrolled; current treatment: metformin 1000 mg BID, Tresiba 18 units daily (increase by 2 units every 3 days for fastings >130) Reports increased hunger. Does still report some "severe stomach trouble" - post prandial diarrhea and sometimes urgency, though reports this  has been going on for 30-35 years. Reports f/u with GI provider later this month Unable to add SGLT2 due to additional brand copay. Over income for assistance  Current glucose readings: fastings: 140-160s; 2 hour post prandial: 170-180s Recommend to increase [place] to 22 units daily.  Will switch to ER metformin to see if improvement in bowel symptoms. Stop IR metformin 1000 mg, start metformin XR 500 mg, 2 tablets twice daily. Patient verbalized understanding.   Hyperlipidemia and ASCVD risk reducation: Controlled per last lipid panel; current treatment: rosuvastatin 10 mg daily   Current antiplatelet regimen: aspirin 81 mg daily  Previously recommended to continue current regimen at this time.   Atrial Fibrillation with HTN: Inappropriately managed; current rhythm control: flecainide 100 mg BID, current rate control: metoprolol tartrate 25 mg BID; anticoagulant treatment: none, Dr. Ubaldo Glassing elected to continue aspirin instead of DOAC due to low afib burden. Additional antihypertensives: lisinopril 20 mg daily, HCTZ 25 mg Denies symptoms of atrial fibrillation. CHADS2VASc = 4.  Previously recommended to continue current regimen at this time. Recommend consideration for  DOAC moving forward.  Overactive Bladder: Controlled per patient report, current treatment; oxybutynin 5 mg BID Moving forward, consider change to XL formulation to reduce risk of anticholinergic side effects Previously recommended to continue current regimen at this time  Supplements/OTC: Multivitamin, Vitamin D, cetirizine  Patient Goals/Self-Care Activities Over the next 90 days, patient will:  - take medications as prescribed check glucose twice daily, document, and provide at future appointments check blood pressure periodically, document, and provide at future appointments      Plan: Telephone follow up appointment with care management team member scheduled for:  2 months  Catie Darnelle Maffucci, PharmD, [place],  CPP Clinical Pharmacist [employer] at [employer] [phone]

## 2021-08-23 ENCOUNTER — Telehealth: Payer: Self-pay | Admitting: Internal Medicine

## 2021-08-23 NOTE — Telephone Encounter (Signed)
I received notification from Catie:  "Given patient had normal colonoscopy in 2017 and no advanced adenoma in [place], can defer colonoscopy until 2027" .  Need to update health maintenance to reflect due 2027.

## 2021-08-23 NOTE — Telephone Encounter (Signed)
Changed in HM

## 2021-08-29 ENCOUNTER — Other Ambulatory Visit: Payer: Self-pay | Admitting: Internal Medicine

## 2021-09-02 DIAGNOSIS — I10 Essential (primary) hypertension: Secondary | ICD-10-CM | POA: Diagnosis not present

## 2021-09-02 DIAGNOSIS — I4891 Unspecified atrial fibrillation: Secondary | ICD-10-CM | POA: Diagnosis not present

## 2021-09-02 DIAGNOSIS — E1165 Type 2 diabetes mellitus with hyperglycemia: Secondary | ICD-10-CM

## 2021-09-02 DIAGNOSIS — E7800 Pure hypercholesterolemia, unspecified: Secondary | ICD-10-CM | POA: Diagnosis not present

## 2021-09-14 ENCOUNTER — Other Ambulatory Visit: Payer: Self-pay

## 2021-09-14 ENCOUNTER — Ambulatory Visit (INDEPENDENT_AMBULATORY_CARE_PROVIDER_SITE_OTHER): Payer: Medicare Other | Admitting: Internal Medicine

## 2021-09-14 DIAGNOSIS — M7989 Other specified soft tissue disorders: Secondary | ICD-10-CM

## 2021-09-14 DIAGNOSIS — G4733 Obstructive sleep apnea (adult) (pediatric): Secondary | ICD-10-CM | POA: Diagnosis not present

## 2021-09-14 DIAGNOSIS — E1165 Type 2 diabetes mellitus with hyperglycemia: Secondary | ICD-10-CM

## 2021-09-14 DIAGNOSIS — E7800 Pure hypercholesterolemia, unspecified: Secondary | ICD-10-CM

## 2021-09-14 DIAGNOSIS — I4891 Unspecified atrial fibrillation: Secondary | ICD-10-CM

## 2021-09-14 DIAGNOSIS — I872 Venous insufficiency (chronic) (peripheral): Secondary | ICD-10-CM | POA: Diagnosis not present

## 2021-09-14 DIAGNOSIS — K219 Gastro-esophageal reflux disease without esophagitis: Secondary | ICD-10-CM

## 2021-09-14 DIAGNOSIS — I10 Essential (primary) hypertension: Secondary | ICD-10-CM | POA: Diagnosis not present

## 2021-09-14 NOTE — Progress Notes (Signed)
Patient ID: Christine Robertson, female   DOB: 1956-02-15, 66 y.o.   MRN: [id number]   Subjective:    Patient ID: Christine Robertson, female    DOB: April 17, 1956, 66 y.o.   MRN: [id number]  This visit occurred during the SARS-CoV-2 public health emergency.  Safety protocols were in place, including screening questions prior to the visit, additional usage of staff PPE, and extensive cleaning of exam room while observing appropriate contact time as indicated for disinfecting solutions.   Patient here for a scheduled follow up  HPI Here to follow up regarding her blood sugar, cholesterol and blood pressure.  Has been having persistent GI issues.  Was diagnosed with gastroparesis.  Her ozempic was stopped given concerns may be aggravating her GI issues.  Has urgency after eating.  Also some issues with diarrhea alternating with constipation.  On insulin now.  Taking fiber and probiotics.  Feels probiotics help some.  Since being off ozempic, she gets hungrier.  Has gained some weight back.  Does not feel symptoms have changed since stopping ozempic.  Wants to restart.  No chest pain.  Breathing stable.  States am sugars 170-200.  PM sugars 180s.     Past Medical History:  Diagnosis Date   Abnormal results of thyroid function studies    Allergy    hay fever   Angina pectoris, unspecified (HCC)    Arthritis    Atrial fibrillation, unspecified    Chicken pox    Cold hands    Congenital anomaly of adrenal gland    Diabetes mellitus    type 2, uncomplicated   Essential hypertension, benign    GERD (gastroesophageal reflux disease)    IBS (irritable bowel syndrome)    Malaise    Morbid obesity (HCC)    Murmur    Pain in soft tissues of limb    Proteinuria    Pure hypercholesterolemia    Rash and other nonspecific skin eruption    Sleep apnea    Obstructive; Uses C-Pap machine   Urinary incontinence    Past Surgical History:  Procedure Laterality Date   ABDOMINAL HYSTERECTOMY  2010   BLADDER  SURGERY  1967   stretching of bladder   BREAST BIOPSY Left 03/28/2015   done at Dr. Dwyane Luo office. fibrocystic changes   COLONOSCOPY     COLONOSCOPY WITH PROPOFOL N/A 09/09/2015   Procedure: COLONOSCOPY WITH PROPOFOL;  Surgeon: Manya Silvas, MD;  Location: [facility] ENDOSCOPY;  Service: Endoscopy;  Laterality: N/A;   ESOPHAGOGASTRODUODENOSCOPY     TONSILLECTOMY AND ADENOIDECTOMY  1959   URETHRAL DILATION     Family History  Problem Relation Age of Onset   Diabetes Mother    Cancer Father        lung   Breast cancer Maternal Grandmother 72   Breast cancer Maternal Aunt 54   Social History   Socioeconomic History   Marital status: Married    Spouse name: Not on file   Number of children: Not on file   Years of education: Not on file   Highest education level: Not on file  Occupational History   Not on file  Tobacco Use   Smoking status: Never   Smokeless tobacco: Never  Substance and Sexual Activity   Alcohol use: No    Alcohol/week: 0.0 standard drinks   Drug use: No   Sexual activity: Not on file  Other Topics Concern   Not on file  Social History Narrative   Not on file  Social Determinants of Health   Financial Resource Strain: Low Risk    Difficulty of Paying Living Expenses: Not hard at all  Food Insecurity: Not on file  Transportation Needs: Not on file  Physical Activity: Not on file  Stress: Not on file  Social Connections: Not on file     Review of Systems  Constitutional:        Reports gets hungrier.  Has gained some weight.    HENT:  Negative for congestion and sinus pressure.   Respiratory:  Negative for cough, chest tightness and shortness of breath.   Cardiovascular:  Negative for chest pain and palpitations.       No increased swelling.   Gastrointestinal:  Negative for nausea and vomiting.       Intermittent abdominal pain.  Alternating diarrhea and constipation.    Genitourinary:  Negative for difficulty urinating and dysuria.   Musculoskeletal:  Negative for joint swelling and myalgias.  Skin:  Negative for color change and rash.  Neurological:  Negative for dizziness, light-headedness and headaches.  Psychiatric/Behavioral:  Negative for agitation and dysphoric mood.       Objective:     BP 130/72    Pulse 71    Temp 97.9 F (36.6 C)    Resp 16    Ht 5\' 4"  (1.626 m)    Wt 283 lb (128.4 kg)    SpO2 98%    BMI 48.58 kg/m  Wt Readings from Last 3 Encounters:  09/14/21 283 lb (128.4 kg)  08/08/21 283 lb (128.4 kg)  06/05/21 278 lb 3.2 oz (126.2 kg)    Physical Exam Vitals reviewed.  Constitutional:      General: She is not in acute distress.    Appearance: Normal appearance.  HENT:     Head: Normocephalic and atraumatic.     Right Ear: External ear normal.     Left Ear: External ear normal.  Eyes:     General: No scleral icterus.       Right eye: No discharge.        Left eye: No discharge.     Conjunctiva/sclera: Conjunctivae normal.  Neck:     Thyroid: No thyromegaly.  Cardiovascular:     Rate and Rhythm: Normal rate and regular rhythm.  Pulmonary:     Effort: No respiratory distress.     Breath sounds: Normal breath sounds. No wheezing.  Abdominal:     General: Bowel sounds are normal.     Palpations: Abdomen is soft.     Tenderness: There is no abdominal tenderness.  Musculoskeletal:        General: No tenderness.     Cervical back: Neck supple. No tenderness.     Comments: No increased lower extremity edema - stable.   Lymphadenopathy:     Cervical: No cervical adenopathy.  Skin:    Findings: No erythema or rash.  Neurological:     Mental Status: She is alert.  Psychiatric:        Mood and Affect: Mood normal.        Behavior: Behavior normal.     Outpatient Encounter Medications as of 09/14/2021  Medication Sig   Ascorbic Acid (VITAMIN C) 1000 MG tablet Take 1,000 mg by mouth daily.   aspirin 81 MG tablet Take 81 mg by mouth daily.   cetirizine (ZYRTEC) 10 MG tablet Take 10  mg by mouth daily.   flecainide (TAMBOCOR) 100 MG tablet Take 100 mg by mouth 2 (two) times  daily.   hydrochlorothiazide (HYDRODIURIL) 25 MG tablet TAKE ONE TABLET BY MOUTH DAILY   insulin degludec (TRESIBA FLEXTOUCH) 100 UNIT/ML FlexTouch Pen Inject 22 units daily. Titrate as instructed. Max daily dose 30 units. (Patient taking differently: Inject 26 units daily. Titrate as instructed. Max daily dose 30 units.)   Insulin Pen Needle (PEN NEEDLES) 32G X 4 MM MISC Use to inject insulin daily   lisinopril (ZESTRIL) 20 MG tablet TAKE ONE TABLET BY MOUTH DAILY   metFORMIN (GLUCOPHAGE XR) 500 MG 24 hr tablet Take 2 tablets (1,000 mg total) by mouth 2 (two) times daily.   metoprolol tartrate (LOPRESSOR) 25 MG tablet Take 25 mg by mouth 2 (two) times daily.   Multiple Vitamin (MULTIVITAMIN) tablet Take 1 tablet by mouth daily.   omeprazole (PRILOSEC) 20 MG capsule TAKE 1 CAPSULE BY MOUTH TWICE DAILY (Patient taking differently: Take 20 mg by mouth daily.)   oxybutynin (DITROPAN) 5 MG tablet TAKE ONE TABLET BY MOUTH TWICE A DAY   Probiotic Product (PROBIOTIC DAILY PO) Take 1 capsule by mouth daily.   rosuvastatin (CRESTOR) 10 MG tablet TAKE ONE TABLET BY MOUTH DAILY   No facility-administered encounter medications on file as of 09/14/2021.     Lab Results  Component Value Date   WBC 11.9 (H) 06/01/2021   HGB 13.6 06/01/2021   HCT 40.8 06/01/2021   PLT 234.0 06/01/2021   GLUCOSE 180 (H) 06/01/2021   CHOL 136 06/01/2021   TRIG 213.0 (H) 06/01/2021   HDL 54.20 06/01/2021   LDLDIRECT 58.0 06/01/2021   LDLCALC 33 02/08/2021   ALT 25 06/01/2021   AST 18 06/01/2021   NA 139 06/01/2021   K 4.3 06/01/2021   CL 100 06/01/2021   CREATININE 0.71 06/01/2021   BUN 25 (H) 06/01/2021   CO2 27 06/01/2021   TSH 0.71 02/08/2021   INR 0.99 11/15/2012   HGBA1C 8.1 (H) 06/01/2021   MICROALBUR 2.2 (H) 09/22/2020    NM GASTRIC EMPTYING  Result Date: 06/03/2021 CLINICAL DATA:  Postprandial abdominal pain  EXAM: NUCLEAR MEDICINE GASTRIC EMPTYING SCAN TECHNIQUE: After oral ingestion of radiolabeled meal, sequential abdominal images were obtained for 4 hours. Percentage of activity emptying the stomach was calculated at 1 hour, 2 hour, 3 hour, and 4 hours. RADIOPHARMACEUTICALS:  2.33 mCi Tc-36m choose well in standardized meal COMPARISON:  None. FINDINGS: Expected location of the stomach in the left upper quadrant. Ingested meal empties the stomach gradually over the course of the study. 26% emptied at 1 hr ( normal >= 10%) 40% emptied at 2 hr ( normal >= 40%) 41% emptied at 3 hr ( normal >= 70%) 70% emptied at 4 hr ( normal >= 90%) IMPRESSION: Initial emptying to 2 hours is within normal limits. The delayed 3 and 4 hour gastric emptying is less than expected. Electronically Signed   By: Inez Catalina M.D.   On: 06/03/2021 02:27       Assessment & Plan:   Problem List Items Addressed This Visit     Atrial fibrillation ([place])    On flecaininde, metoprolol and aspirin.  Stable.        Chronic venous insufficiency    Lymphedema pump.       Diabetes mellitus ([place])    Off ozempic.  On tresiba and metformin.  Sugars remain elevated.  Gaining weight.  Hungrier.  Seeing GI.  Diagnosed with gastroparesis.  Reviewed recent GI note.  Per GI, not opposed to her restarting ozempic - especially if would allow  for better sugar control and for weight loss.        Relevant Orders   Hemoglobin [id number]   Basic metabolic panel   Microalbumin / creatinine urine ratio   GERD (gastroesophageal reflux disease)    Taking omeprazole 20mg  q day.  No acid reflux reported.       Hypercholesteremia    On crestor.  Low cholesterol diet and exercise.  Follow lipid panel and liver function tests.        Relevant Orders   CBC with Differential/Platelet   Hepatic function panel   Lipid panel   Hypertension    Continue lisinopril, hctz and metoprolol.  Blood pressure doing well.  Continue current medication.  Follow  pressures.       Obstructive sleep apnea    Continue cpap.       Swelling of lower extremity    Has had persistent issues with lower extremity swelling. Has seen vascular surgery.  Has lymphedema pump.  Needs to use regularly.  Follow.          Einar Pheasant, MD

## 2021-09-17 ENCOUNTER — Encounter: Payer: Self-pay | Admitting: Internal Medicine

## 2021-09-17 ENCOUNTER — Telehealth: Payer: Self-pay | Admitting: Internal Medicine

## 2021-09-17 ENCOUNTER — Other Ambulatory Visit: Payer: Self-pay | Admitting: Internal Medicine

## 2021-09-17 NOTE — Assessment & Plan Note (Signed)
Has had persistent issues with lower extremity swelling. Has seen vascular surgery.  Has lymphedema pump.  Needs to use regularly.  Follow.

## 2021-09-17 NOTE — Assessment & Plan Note (Signed)
On flecaininde, metoprolol and aspirin.  Stable.   

## 2021-09-17 NOTE — Assessment & Plan Note (Signed)
On crestor.  Low cholesterol diet and exercise.  Follow lipid panel and liver function tests.   

## 2021-09-17 NOTE — Assessment & Plan Note (Signed)
Continue lisinopril, hctz and metoprolol.  Blood pressure doing well.  Continue current medication.  Follow pressures.  

## 2021-09-17 NOTE — Assessment & Plan Note (Signed)
Taking omeprazole 20mg q day.  No acid reflux reported.  

## 2021-09-17 NOTE — Telephone Encounter (Signed)
I recently saw her and we discussed her GI symptoms and ozempic.  I had stopped given her GI issues.  Reviewed GI information and Dr Mia Creek is ok with her restarting ozempic and following symptoms.  If any worsening, she will need to stop. Please arrange an appt with Catie to discuss restarting and pt concerned regarding insurance coverage.  Please notify pt of above.

## 2021-09-17 NOTE — Assessment & Plan Note (Signed)
Off ozempic.  On tresiba and metformin.  Sugars remain elevated.  Gaining weight.  Hungrier.  Seeing GI.  Diagnosed with gastroparesis.  Reviewed recent GI note.  Per GI, not opposed to her restarting ozempic - especially if would allow for better sugar control and for weight loss.

## 2021-09-17 NOTE — Assessment & Plan Note (Signed)
Lymphedema pump 

## 2021-09-17 NOTE — Assessment & Plan Note (Signed)
Continue cpap.  

## 2021-09-18 NOTE — Telephone Encounter (Signed)
Pt is aware of message above. Pt is needing an appt with Catie to discuss restarting the Ozempic and to discuss insurance coverage.

## 2021-09-18 NOTE — Telephone Encounter (Signed)
Pt has been scheduled.  °

## 2021-09-20 ENCOUNTER — Ambulatory Visit (INDEPENDENT_AMBULATORY_CARE_PROVIDER_SITE_OTHER): Payer: Medicare Other | Admitting: Pharmacist

## 2021-09-20 DIAGNOSIS — E7800 Pure hypercholesterolemia, unspecified: Secondary | ICD-10-CM

## 2021-09-20 DIAGNOSIS — I4891 Unspecified atrial fibrillation: Secondary | ICD-10-CM

## 2021-09-20 DIAGNOSIS — E1165 Type 2 diabetes mellitus with hyperglycemia: Secondary | ICD-10-CM

## 2021-09-20 DIAGNOSIS — I10 Essential (primary) hypertension: Secondary | ICD-10-CM

## 2021-09-20 MED ORDER — SEMAGLUTIDE (2 MG/DOSE) 8 MG/3ML ~~LOC~~ SOPN: 2.0000 mg | PEN_INJECTOR | SUBCUTANEOUS | 1 refills | Status: DC

## 2021-09-20 NOTE — Chronic Care Management (AMB) (Signed)
Chronic Care Management CCM Pharmacy Note  09/20/2021 Name:  Christine Robertson MRN:  [id number] DOB:  05/30/1956  Summary: - PCP and GI in agreement to restart Ozempic  Recommendations/Changes made from today's visit: - Restart Ozempic at 0.25 mg weekly for 2 weeks, then 0.5 mg weekly for 2 weeks, then 1 mg weekly.   Subjective: Christine Robertson is an [age] y.o. year old female who is a primary patient of Dale DurhamScott, Charlene, MD.  The CCM team was consulted for assistance with disease management and care coordination needs.    Engaged with patient by telephone for follow up visit for pharmacy case management and/or care coordination services.   Objective:  Medications Reviewed Today     Reviewed by Lourena Simmondsravis, Drucilla Cumber E, RPH-CPP (Pharmacist) on 09/20/21 at 1509  Med List Status: <None>   Medication Order Taking? Sig Documenting Provider Last Dose Status Informant  Ascorbic Acid (VITAMIN C) 1000 MG tablet [id number]  Take 1,000 mg by mouth daily. [provider]  Active   aspirin 81 MG tablet [id number] Yes Take 81 mg by mouth daily. [provider] Taking Active Self  cetirizine (ZYRTEC) 10 MG tablet [id number] Yes Take 10 mg by mouth daily. [provider] Taking Active Self  flecainide (TAMBOCOR) 100 MG tablet [id number] Yes Take 100 mg by mouth 2 (two) times daily. [provider] Taking Active   hydrochlorothiazide (HYDRODIURIL) 25 MG tablet [id number] Yes TAKE ONE TABLET BY MOUTH DAILY Dale DurhamScott, Charlene, MD Taking Active   insulin degludec (TRESIBA FLEXTOUCH) 100 UNIT/ML FlexTouch Pen [id number] Yes Inject 22 units daily. Titrate as instructed. Max daily dose 30 units.  Patient taking differently: 26 Units. Inject 26 units daily. Titrate as instructed. Max daily dose 30 units.   Dale DurhamScott, Charlene, MD Taking Active   Insulin Pen Needle (PEN NEEDLES) 32G X 4 MM MISC [id number] Yes Use to inject insulin daily Dale DurhamScott, Charlene, MD Taking Active   lisinopril (ZESTRIL) 20  MG tablet [id number] Yes TAKE ONE TABLET BY MOUTH DAILY Dale DurhamScott, Charlene, MD Taking Active   metFORMIN (GLUCOPHAGE XR) 500 MG 24 hr tablet [id number] Yes Take 2 tablets (1,000 mg total) by mouth 2 (two) times daily. Dale DurhamScott, Charlene, MD Taking Active   metoprolol tartrate (LOPRESSOR) 25 MG tablet [id number] Yes Take 25 mg by mouth 2 (two) times daily. Dalia HeadingFath, Kenneth A, MD Taking Active   Multiple Vitamin (MULTIVITAMIN) tablet [id number] Yes Take 1 tablet by mouth daily. [provider] Taking Active Self  omeprazole (PRILOSEC) 20 MG capsule [id number] Yes TAKE 1 CAPSULE BY MOUTH TWICE DAILY  Patient taking differently: Take 20 mg by mouth daily.   Dale DurhamScott, Charlene, MD Taking Active            Med Note Tyrone Nine(Letti Towell E   Thu Nov 19, 2019 11:08 AM) Once daily   oxybutynin (DITROPAN) 5 MG tablet [id number] Yes TAKE ONE TABLET BY MOUTH TWICE A Donne AnonAY Scott, Charlene, MD Taking Active   Probiotic Product (PROBIOTIC DAILY PO) [id number] Yes Take 1 capsule by mouth daily. [provider] Taking Active   rosuvastatin (CRESTOR) 10 MG tablet [id number] Yes TAKE ONE TABLET BY MOUTH DAILY Dale DurhamScott, Charlene, MD Taking Active             Pertinent Labs:   Lab Results  Component Value Date   HGBA1C 8.1 (H) 06/01/2021   Lab Results  Component Value Date   CHOL 136 06/01/2021   HDL 54.20 06/01/2021   LDLCALC 33 02/08/2021   LDLDIRECT 58.0  06/01/2021   TRIG 213.0 (H) 06/01/2021   CHOLHDL 3 06/01/2021   Lab Results  Component Value Date   CREATININE 0.71 06/01/2021   BUN 25 (H) 06/01/2021   NA 139 06/01/2021   K 4.3 06/01/2021   CL 100 06/01/2021   CO2 27 06/01/2021    SDOH:  (Social Determinants of Health) assessments and interventions performed:  SDOH Interventions    Flowsheet Row Most Recent Value  SDOH Interventions   Financial Strain Interventions Other (Comment)  [over income for patient assistance]       CCM Care Plan  Review of patient past medical history, allergies,  medications, health status, including review of consultants reports, laboratory and other test data, was performed as part of comprehensive evaluation and provision of chronic care management services.   Care Plan : Medication Management  Updates made by Lourena Simmonds, RPH-CPP since 09/20/2021 12:00 AM     Problem: Diabetes, HTN, AFib      Long-Range Goal: Disease Progression Prevention   Recent Progress: On track  Priority: High  Note:   Current Barriers:  Unable to achieve control of diabetes   Pharmacist Clinical Goal(s):  Over the next 90 days, patient will verbalize ability to afford treatment regimen Over the next 90 days, patient will achieve control of diabetes as evidenced by A1c through collaboration with PharmD and provider.  Interventions: 1:1 collaboration with Dale Outlook, MD regarding development and update of comprehensive plan of care as evidenced by provider attestation and co-signature Inter-disciplinary care team collaboration (see longitudinal plan of care) Comprehensive medication review performed; medication list updated in electronic medical record    Health Maintenance Yearly diabetic eye exam: up to date  Yearly diabetic foot exam: up to date  Urine microalbumin: up to date Yearly influenza vaccination: up to date  Td/Tdap vaccination: due  - previously encouraged to pursue at local pharmacy in 2023 Pneumonia vaccination: up to date  COVID vaccinations: due - encouraged bivalent booster dose, patient will consider Shingrix vaccinations: due - encouraged to pursue at local pharmacy in 2023 Colonoscopy: up to date  Bone density scan: due - encouraged to discuss with Dr. Lorin Picket  Mammogram: up to date  Diabetes: Uncontrolled; current treatment: metformin XR 1000 mg BID, Tresiba 26 units daily GI advised that restarting Ozempic would be appropriate.  Unable to add SGLT2 due to additional brand copay. Over income for assistance  Current glucose  readings: fastings: 170-220s, post prandial into 300s Restart Ozempic. She has 2 mg pen at home. Inject 0.25 mg (10 clicks) weekly for 2 weeks, then increase to 0.5 mg (19 clicks) weekly for 2 weeks, then increase to 1 mg weekly (37 clicks)  Hyperlipidemia and ASCVD risk reducation: Controlled per last lipid panel; current treatment: rosuvastatin 10 mg daily   Current antiplatelet regimen: aspirin 81 mg daily  Previously recommended to continue current regimen at this time.   Atrial Fibrillation with HTN: Inappropriately managed; current rhythm control: flecainide 100 mg BID, current rate control: metoprolol tartrate 25 mg BID; anticoagulant treatment: none, Dr. Lady Gary elected to continue aspirin instead of DOAC due to low afib burden. Additional antihypertensives: lisinopril 20 mg daily, HCTZ 25 mg Denies symptoms of atrial fibrillation. CHADS2VASc = 4.  Previously recommended to continue current regimen at this time. Recommend consideration for DOAC moving forward.  Overactive Bladder: Controlled per patient report, current treatment; oxybutynin 5 mg BID Moving forward, can consider change to XL formulation to reduce risk of anticholinergic side effects Previously recommended to  continue current regimen at this time  Supplements/OTC: Multivitamin, Vitamin D, cetirizine  Patient Goals/Self-Care Activities Over the next 90 days, patient will:  - take medications as prescribed check glucose twice daily, document, and provide at future appointments check blood pressure periodically, document, and provide at future appointments      Plan: Telephone follow up appointment with care management team member scheduled for:  6 weeks  [name], PharmD, [place], CPP Clinical Pharmacist [employer] at [employer] [facility] [phone]

## 2021-09-20 NOTE — Patient Instructions (Signed)
Visit Information  Following are the goals we discussed today:  Patient Goals/Self-Care Activities Over the next 90 days, patient will:  - take medications as prescribed check glucose twice daily, document, and provide at future appointments check blood pressure periodically, document, and provide at future appointments        Plan: Telephone follow up appointment with care management team member scheduled for:  6 weeks   [name], PharmD, [place], CPP Clinical Pharmacist [facility] at [facility] [phone]     Please call the care guide team at [phone] if you need to cancel or reschedule your appointment.   Patient verbalizes understanding of instructions and care plan provided today and agrees to view in MyChart. Active MyChart status confirmed with patient.

## 2021-09-21 ENCOUNTER — Ambulatory Visit (INDEPENDENT_AMBULATORY_CARE_PROVIDER_SITE_OTHER): Payer: Medicare Other

## 2021-09-21 VITALS — Ht 64.0 in | Wt 283.0 lb

## 2021-09-21 DIAGNOSIS — Z0000 Encounter for general adult medical examination without abnormal findings: Secondary | ICD-10-CM | POA: Diagnosis not present

## 2021-09-21 NOTE — Progress Notes (Signed)
Subjective:   Christine Robertson is a 66 y.o. female who presents for Medicare Annual (Subsequent) preventive examination.  Review of Systems    No ROS.  Medicare Wellness Virtual Visit.  Visual/audio telehealth visit, UTA vital signs.   See social history for additional risk factors.   Cardiac Risk Factors include: advanced age (>60men, >18 women);diabetes mellitus     Objective:    Today's Vitals   09/21/21 0955  Weight: 283 lb (128.4 kg)  Height: 5\' 4"  (1.626 m)   Body mass index is 48.58 kg/m.  Advanced Directives 09/21/2021 08/08/2020 06/13/2020  Does Patient Have a Medical Advance Directive? No No No  Would patient like information on creating a medical advance directive? No - Patient declined - No - Patient declined   Current Medications (verified) Outpatient Encounter Medications as of 09/21/2021  Medication Sig   Ascorbic Acid (VITAMIN C) 1000 MG tablet Take 1,000 mg by mouth daily.   aspirin 81 MG tablet Take 81 mg by mouth daily.   cetirizine (ZYRTEC) 10 MG tablet Take 10 mg by mouth daily.   flecainide (TAMBOCOR) 100 MG tablet Take 100 mg by mouth 2 (two) times daily.   hydrochlorothiazide (HYDRODIURIL) 25 MG tablet TAKE ONE TABLET BY MOUTH DAILY   insulin degludec (TRESIBA FLEXTOUCH) 100 UNIT/ML FlexTouch Pen Inject 22 units daily. Titrate as instructed. Max daily dose 30 units. (Patient taking differently: 26 Units. Inject 26 units daily. Titrate as instructed. Max daily dose 30 units.)   Insulin Pen Needle (PEN NEEDLES) 32G X 4 MM MISC Use to inject insulin daily   lisinopril (ZESTRIL) 20 MG tablet TAKE ONE TABLET BY MOUTH DAILY   metFORMIN (GLUCOPHAGE XR) 500 MG 24 hr tablet Take 2 tablets (1,000 mg total) by mouth 2 (two) times daily.   metoprolol tartrate (LOPRESSOR) 25 MG tablet Take 25 mg by mouth 2 (two) times daily.   Multiple Vitamin (MULTIVITAMIN) tablet Take 1 tablet by mouth daily.   omeprazole (PRILOSEC) 20 MG capsule TAKE 1 CAPSULE BY MOUTH TWICE  DAILY (Patient taking differently: Take 20 mg by mouth daily.)   oxybutynin (DITROPAN) 5 MG tablet TAKE ONE TABLET BY MOUTH TWICE A DAY   Probiotic Product (PROBIOTIC DAILY PO) Take 1 capsule by mouth daily.   rosuvastatin (CRESTOR) 10 MG tablet TAKE ONE TABLET BY MOUTH DAILY   Semaglutide, 2 MG/DOSE, 8 MG/3ML SOPN Inject 2 mg as directed once a week.   No facility-administered encounter medications on file as of 09/21/2021.    Allergies (verified) Erythromycin, Augmentin [amoxicillin-pot clavulanate], and Demerol [meperidine]   History: Past Medical History:  Diagnosis Date   Abnormal results of thyroid function studies    Allergy    hay fever   Angina pectoris, unspecified (HCC)    Arthritis    Atrial fibrillation, unspecified    Chicken pox    Cold hands    Congenital anomaly of adrenal gland    Diabetes mellitus    type 2, uncomplicated   Essential hypertension, benign    GERD (gastroesophageal reflux disease)    IBS (irritable bowel syndrome)    Malaise    Morbid obesity (HCC)    Murmur    Pain in soft tissues of limb    Proteinuria    Pure hypercholesterolemia    Rash and other nonspecific skin eruption    Sleep apnea    Obstructive; Uses C-Pap machine   Urinary incontinence    Past Surgical History:  Procedure Laterality Date   ABDOMINAL  HYSTERECTOMY  2010   BLADDER SURGERY  1967   stretching of bladder   BREAST BIOPSY Left 03/28/2015   done at Dr. Dwyane Luo office. fibrocystic changes   COLONOSCOPY     COLONOSCOPY WITH PROPOFOL N/A 09/09/2015   Procedure: COLONOSCOPY WITH PROPOFOL;  Surgeon: Manya Silvas, MD;  Location: [facility] ENDOSCOPY;  Service: Endoscopy;  Laterality: N/A;   ESOPHAGOGASTRODUODENOSCOPY     TONSILLECTOMY AND ADENOIDECTOMY  1959   URETHRAL DILATION     Family History  Problem Relation Age of Onset   Diabetes Mother    Cancer Father        lung   Breast cancer Maternal Grandmother 76   Breast cancer Maternal Aunt 46   Social History    Socioeconomic History   Marital status: Married    Spouse name: Not on file   Number of children: Not on file   Years of education: Not on file   Highest education level: Not on file  Occupational History   Not on file  Tobacco Use   Smoking status: Never   Smokeless tobacco: Never  Substance and Sexual Activity   Alcohol use: No    Alcohol/week: 0.0 standard drinks   Drug use: No   Sexual activity: Not on file  Other Topics Concern   Not on file  Social History Narrative   Not on file   Social Determinants of Health   Financial Resource Strain: Medium Risk   Difficulty of Paying Living Expenses: Somewhat hard  Food Insecurity: No Food Insecurity   Worried About Running Out of Food in the Last Year: Never true   Ran Out of Food in the Last Year: Never true  Transportation Needs: No Transportation Needs   Lack of Transportation (Medical): No   Lack of Transportation (Non-Medical): No  Physical Activity: Not on file  Stress: No Stress Concern Present   Feeling of Stress : Not at all  Social Connections: Unknown   Frequency of Communication with Friends and Family: Not on file   Frequency of Social Gatherings with Friends and Family: Not on file   Attends Religious Services: Not on file   Active Member of Clubs or Organizations: Not on file   Attends Archivist Meetings: Not on file   Marital Status: Married   Tobacco Counseling Counseling given: Not Answered   Clinical Intake:  Pre-visit preparation completed: Yes       Nutrition Risk Assessment: Does the patient have any non-healing wounds?  No   Diabetes: If diabetic, was a CBG obtained today?  Yes , FBS 190 How often do you monitor your CBG's? BID.   Financial Strains and Diabetes Management: Are you having any financial strains with the device, your supplies or your medication? No .   Is the patient seen by Chronic Care Management for management of their diabetes?  Yes   Would the  patient like to be referred to a Nutritionist or for Diabetic Management?  Not at this time but may consult in the near future.   Diabetes: Yes (Followed by PCP)  How often do you need to have someone help you when you read instructions, pamphlets, or other written materials from your doctor or pharmacy?: 1 - Never Interpreter Needed?: No      Activities of Daily Living In your present state of health, do you have any difficulty performing the following activities: 09/21/2021  Hearing? N  Vision? N  Difficulty concentrating or making decisions? N  Walking or  climbing stairs? N  Dressing or bathing? N  Doing errands, shopping? N  Preparing Food and eating ? N  Using the Toilet? N  In the past six months, have you accidently leaked urine? N  Comment Managed with medication and daily liner  Do you have problems with loss of bowel control? N  Managing your Medications? N  Managing your Finances? N  Housekeeping or managing your Housekeeping? N  Some recent data might be hidden   Patient Care Team: Einar Pheasant, MD as PCP - General (Internal Medicine) Einar Pheasant, MD (Internal Medicine) Bary Castilla, Forest Gleason, MD (General Surgery) De Hollingshead, RPH-CPP (Pharmacist)  Indicate any recent Medical Services you may have received from other than [facility] providers in the past year (date may be approximate).     Assessment:   This is a routine wellness examination for Ashna.  Virtual Visit via Telephone Note  I connected with  Christine Robertson on 09/21/21 at  9:45 AM EST by telephone and verified that I am speaking with the correct person using two identifiers.  Persons participating in the virtual visit: patient/Nurse Health Advisor   I discussed the limitations, risks, security and privacy concerns of performing an evaluation and management service by telephone and the availability of in person appointments. The patient expressed understanding and agreed to  proceed.  Interactive audio and video telecommunications were attempted between this nurse and patient, however failed, due to patient having technical difficulties OR patient did not have access to video capability.  We continued and completed visit with audio only.  Some vital signs may be absent or patient reported.   Hearing/Vision screen Hearing Screening - Comments:: Patient is able to hear conversational tones without difficulty.  No issues reported. Vision Screening - Comments:: Wears corrective lenses  No retinopathy reported They have seen their ophthalmologist in the last 12 months.   Dietary issues and exercise activities discussed: Current Exercise Habits: Home exercise routine, Type of exercise: walking, Intensity: Mild   Goals Addressed               This Visit's Progress     Patient Stated     Increase physical activity (pt-stated)        Healthy diet Weight loss        Depression Screen PHQ 2/9 Scores 09/21/2021 08/08/2021 06/05/2021 06/13/2020 01/08/2020 10/06/2019 04/25/2017  PHQ - 2 Score 0 0 0 0 0 0 0  PHQ- 9 Score - - - - 2 0 -    Fall Risk Fall Risk  09/21/2021 08/08/2021 06/05/2021 03/02/2021 06/13/2020  Falls in the past year? 0 0 1 1 0  Number falls in past yr: 0 0 0 0 0  Injury with Fall? - 0 0 0 -  Risk for fall due to : - No Fall Risks - - -  Follow up Falls evaluation completed Falls evaluation completed Falls evaluation completed Falls evaluation completed Falls evaluation completed    [place]: Home free of loose throw rugs in walkways, pet beds, electrical cords, etc? Yes  Adequate lighting in your home to reduce risk of falls? Yes   ASSISTIVE DEVICES UTILIZED TO PREVENT FALLS: Life alert? No  Use of a cane, walker or w/c? No   TIMED UP AND GO: Was the test performed? No .   Cognitive Function:     6CIT Screen 09/21/2021  What Year? 0 points  What month? 0 points  What time? 0 points  Count back  from 20 0 points  Months in reverse 0 points  Repeat phrase 0 points  Total Score 0    Immunizations Immunization History  Administered Date(s) Administered   Fluad Quad(high Dose 65+) 05/10/2020, 06/05/2021   Influenza Split 06/06/2012, 06/03/2014   Influenza,inj,Quad PF,6+ Mos 06/23/2013, 05/18/2015, 07/16/2016, 07/03/2017   PFIZER(Purple Top)SARS-COV-2 Vaccination 10/16/2019, 11/11/2019   PNEUMOCOCCAL CONJUGATE-20 03/02/2021   Pneumococcal Polysaccharide-23 08/31/2016   Zoster, Live 06/01/2015   TDAP status: Due, Education has been provided regarding the importance of this vaccine. Advised may receive this vaccine at local pharmacy or Health Dept. Aware to provide a copy of the vaccination record if obtained from local pharmacy or Health Dept. Verbalized acceptance and understanding. Deferred.   Shingrix Completed?: No.    Education has been provided regarding the importance of this vaccine. Patient has been advised to call insurance company to determine out of pocket expense if they have not yet received this vaccine. Advised may also receive vaccine at local pharmacy or Health Dept. Verbalized acceptance and understanding.  Screening Tests Health Maintenance  Topic Date Due   COVID-19 Vaccine (3 - Booster for Pfizer series) 09/30/2021 (Originally 01/06/2020)   Zoster Vaccines- Shingrix (1 of 2) 12/13/2021 (Originally 04/08/2004)   DEXA SCAN  09/21/2022 (Originally 04/09/2019)   TETANUS/TDAP  09/21/2022 (Originally 04/08/1973)   HEMOGLOBIN A1C  11/29/2021   OPHTHALMOLOGY EXAM  05/26/2022   MAMMOGRAM  05/29/2022   FOOT EXAM  06/05/2022   COLONOSCOPY (Pts 45-53yrs Insurance coverage will need to be confirmed)  09/08/2025   Pneumonia Vaccine 19+ Years old  Completed   INFLUENZA VACCINE  Completed   Hepatitis C Screening  Completed   HPV VACCINES  Aged Out   Health Maintenance There are no preventive care reminders to display for this patient.  Lung Cancer Screening: (Low Dose CT  Chest recommended if Age 70-80 years, 30 pack-year currently smoking OR have quit w/in 15years.) does not qualify.   Vision Screening: Recommended annual ophthalmology exams for early detection of glaucoma and other disorders of the eye.  Dental Screening: Recommended annual dental exams for proper oral hygiene  Community Resource Referral / Chronic Care Management: CRR required this visit?  No   CCM required this visit?  No      Plan:   Keep all routine maintenance appointments.   I have personally reviewed and noted the following in the patients chart:   Medical and social history Use of alcohol, tobacco or illicit drugs  Current medications and supplements including opioid prescriptions. Not taking opioid.  Functional ability and status Nutritional status Physical activity Advanced directives List of other physicians Hospitalizations, surgeries, and ER visits in previous 12 months Vitals Screenings to include cognitive, depression, and falls Referrals and appointments  In addition, I have reviewed and discussed with patient certain preventive protocols, quality metrics, and best practice recommendations. A written personalized care plan for preventive services as well as general preventive health recommendations were provided to patient.     Varney Biles, LPN   [phone]

## 2021-09-21 NOTE — Patient Instructions (Addendum)
[name] , Thank you for taking time to come for your Medicare Wellness Visit. I appreciate your ongoing commitment to your health goals. Please review the following plan we discussed and let me know if I can assist you in the future.   These are the goals we discussed:  Goals       Patient Stated     Increase physical activity (pt-stated)      Healthy diet Weight loss         This is a list of the screening recommended for you and due dates:  Health Maintenance  Topic Date Due   COVID-19 Vaccine (3 - Booster for Pfizer series) 09/30/2021*   Zoster (Shingles) Vaccine (1 of 2) 12/13/2021*   DEXA scan (bone density measurement)  09/21/2022*   Tetanus Vaccine  09/21/2022*   Hemoglobin A1C  11/29/2021   Eye exam for diabetics  05/26/2022   Mammogram  05/29/2022   Complete foot exam   06/05/2022   Colon Cancer Screening  09/08/2025   Pneumonia Vaccine  Completed   Flu Shot  Completed   Hepatitis C Screening: USPSTF Recommendation to screen - Ages 18-79 yo.  Completed   HPV Vaccine  Aged Out  *Topic was postponed. The date shown is not the original due date.    Advanced directives: not yet completed  Conditions/risks identified: none new  Follow up in one year for your annual wellness visit    Preventive Care 65 Years and Older, Female Preventive care refers to lifestyle choices and visits with your health care provider that can promote health and wellness. What does preventive care include? A yearly physical exam. This is also called an annual well check. Dental exams once or twice a year. Routine eye exams. Ask your health care provider how often you should have your eyes checked. Personal lifestyle choices, including: Daily care of your teeth and gums. Regular physical activity. Eating a healthy diet. Avoiding tobacco and drug use. Limiting alcohol use. Practicing safe sex. Taking low-dose aspirin every day. Taking vitamin and mineral supplements as recommended  by your health care provider. What happens during an annual well check? The services and screenings done by your health care provider during your annual well check will depend on your age, overall health, lifestyle risk factors, and family history of disease. Counseling  Your health care provider may ask you questions about your: Alcohol use. Tobacco use. Drug use. Emotional well-being. Home and relationship well-being. Sexual activity. Eating habits. History of falls. Memory and ability to understand (cognition). Work and work Astronomer. Reproductive health. Screening  You may have the following tests or measurements: Height, weight, and BMI. Blood pressure. Lipid and cholesterol levels. These may be checked every 5 years, or more frequently if you are over 19 years old. Skin check. Lung cancer screening. You may have this screening every year starting at age 47 if you have a 30-pack-year history of smoking and currently smoke or have quit within the past 15 years. Fecal occult blood test (FOBT) of the stool. You may have this test every year starting at age 42. Flexible sigmoidoscopy or colonoscopy. You may have a sigmoidoscopy every 5 years or a colonoscopy every 10 years starting at age 42. Hepatitis C blood test. Hepatitis B blood test. Sexually transmitted disease (STD) testing. Diabetes screening. This is done by checking your blood sugar (glucose) after you have not eaten for a while (fasting). You may have this done every 1-3 years. Bone density scan. This  is done to screen for osteoporosis. You may have this done starting at age 104. Mammogram. This may be done every 1-2 years. Talk to your health care provider about how often you should have regular mammograms. Talk with your health care provider about your test results, treatment options, and if necessary, the need for more tests. Vaccines  Your health care provider may recommend certain vaccines, such as: Influenza  vaccine. This is recommended every year. Tetanus, diphtheria, and acellular pertussis (Tdap, Td) vaccine. You may need a Td booster every 10 years. Zoster vaccine. You may need this after age 56. Pneumococcal 13-valent conjugate (PCV13) vaccine. One dose is recommended after age 79. Pneumococcal polysaccharide (PPSV23) vaccine. One dose is recommended after age 28. Talk to your health care provider about which screenings and vaccines you need and how often you need them. This information is not intended to replace advice given to you by your health care provider. Make sure you discuss any questions you have with your health care provider. Document Released: 09/16/2015 Document Revised: 05/09/2016 Document Reviewed: 06/21/2015 Elsevier Interactive Patient Education  2017 [place] Prevention in the Home Falls can cause injuries. They can happen to people of all ages. There are many things you can do to make your home safe and to help prevent falls. What can I do on the outside of my home? Regularly fix the edges of walkways and driveways and fix any cracks. Remove anything that might make you trip as you walk through a door, such as a raised step or threshold. Trim any bushes or trees on the path to your home. Use bright outdoor lighting. Clear any walking paths of anything that might make someone trip, such as rocks or tools. Regularly check to see if handrails are loose or broken. Make sure that both sides of any steps have handrails. Any raised decks and porches should have guardrails on the edges. Have any leaves, snow, or ice cleared regularly. Use sand or salt on walking paths during winter. Clean up any spills in your garage right away. This includes oil or grease spills. What can I do in the bathroom? Use night lights. Install grab bars by the toilet and in the tub and shower. Do not use towel bars as grab bars. Use non-skid mats or decals in the tub or shower. If you  need to sit down in the shower, use a plastic, non-slip stool. Keep the floor dry. Clean up any water that spills on the floor as soon as it happens. Remove soap buildup in the tub or shower regularly. Attach bath mats securely with double-sided non-slip rug tape. Do not have throw rugs and other things on the floor that can make you trip. What can I do in the bedroom? Use night lights. Make sure that you have a light by your bed that is easy to reach. Do not use any sheets or blankets that are too big for your bed. They should not hang down onto the floor. Have a firm chair that has side arms. You can use this for support while you get dressed. Do not have throw rugs and other things on the floor that can make you trip. What can I do in the kitchen? Clean up any spills right away. Avoid walking on wet floors. Keep items that you use a lot in easy-to-reach places. If you need to reach something above you, use a strong step stool that has a grab bar. Keep electrical cords out  of the way. Do not use floor polish or wax that makes floors slippery. If you must use wax, use non-skid floor wax. Do not have throw rugs and other things on the floor that can make you trip. What can I do with my stairs? Do not leave any items on the stairs. Make sure that there are handrails on both sides of the stairs and use them. Fix handrails that are broken or loose. Make sure that handrails are as long as the stairways. Check any carpeting to make sure that it is firmly attached to the stairs. Fix any carpet that is loose or worn. Avoid having throw rugs at the top or bottom of the stairs. If you do have throw rugs, attach them to the floor with carpet tape. Make sure that you have a light switch at the top of the stairs and the bottom of the stairs. If you do not have them, ask someone to add them for you. What else can I do to help prevent falls? Wear shoes that: Do not have high heels. Have rubber  bottoms. Are comfortable and fit you well. Are closed at the toe. Do not wear sandals. If you use a stepladder: Make sure that it is fully opened. Do not climb a closed stepladder. Make sure that both sides of the stepladder are locked into place. Ask someone to hold it for you, if possible. Clearly mark and make sure that you can see: Any grab bars or handrails. First and last steps. Where the edge of each step is. Use tools that help you move around (mobility aids) if they are needed. These include: Canes. Walkers. Scooters. Crutches. Turn on the lights when you go into a dark area. Replace any light bulbs as soon as they burn out. Set up your furniture so you have a clear path. Avoid moving your furniture around. If any of your floors are uneven, fix them. If there are any pets around you, be aware of where they are. Review your medicines with your doctor. Some medicines can make you feel dizzy. This can increase your chance of falling. Ask your doctor what other things that you can do to help prevent falls. This information is not intended to replace advice given to you by your health care provider. Make sure you discuss any questions you have with your health care provider. Document Released: 06/16/2009 Document Revised: 01/26/2016 Document Reviewed: 09/24/2014 Elsevier Interactive Patient Education  2017 [employer].

## 2021-10-03 DIAGNOSIS — I4891 Unspecified atrial fibrillation: Secondary | ICD-10-CM | POA: Diagnosis not present

## 2021-10-03 DIAGNOSIS — E785 Hyperlipidemia, unspecified: Secondary | ICD-10-CM | POA: Diagnosis not present

## 2021-10-03 DIAGNOSIS — Z7984 Long term (current) use of oral hypoglycemic drugs: Secondary | ICD-10-CM

## 2021-10-03 DIAGNOSIS — I10 Essential (primary) hypertension: Secondary | ICD-10-CM

## 2021-10-03 DIAGNOSIS — E1165 Type 2 diabetes mellitus with hyperglycemia: Secondary | ICD-10-CM | POA: Diagnosis not present

## 2021-10-10 ENCOUNTER — Other Ambulatory Visit: Payer: Self-pay | Admitting: Internal Medicine

## 2021-10-30 ENCOUNTER — Ambulatory Visit (INDEPENDENT_AMBULATORY_CARE_PROVIDER_SITE_OTHER): Payer: Medicare Other | Admitting: Pharmacist

## 2021-10-30 VITALS — Wt 275.0 lb

## 2021-10-30 DIAGNOSIS — E7800 Pure hypercholesterolemia, unspecified: Secondary | ICD-10-CM

## 2021-10-30 DIAGNOSIS — E1165 Type 2 diabetes mellitus with hyperglycemia: Secondary | ICD-10-CM

## 2021-10-30 DIAGNOSIS — I10 Essential (primary) hypertension: Secondary | ICD-10-CM

## 2021-10-30 NOTE — Chronic Care Management (AMB) (Signed)
Chronic Care Management CCM Pharmacy Note  10/30/2021 Name:  Christine Robertson MRN:  [id number] DOB:  1956-01-31  Summary: - Elects to discontinue Ozempic to avoid any GI side effects  Recommendations/Changes made from today's visit: - Increase Tresiba to 30 units daily. Continue metformin XR 1000 mg twice daily  Subjective: Christine Robertson is an 66 y.o. year old female who is a primary patient of Einar Pheasant, MD.  The CCM team was consulted for assistance with disease management and care coordination needs.    Engaged with patient by telephone for follow up visit for pharmacy case management and/or care coordination services.   Objective:  Medications Reviewed Today     Reviewed by De Hollingshead, RPH-CPP (Pharmacist) on 10/30/21 at 65  Med List Status: <None>   Medication Order Taking? Sig Documenting Provider Last Dose Status Informant  Ascorbic Acid (VITAMIN C) 1000 MG tablet [id number] Yes Take 1,000 mg by mouth daily. [provider] Taking Active   aspirin 81 MG tablet [id number] Yes Take 81 mg by mouth daily. [provider] Taking Active Self  cetirizine (ZYRTEC) 10 MG tablet [id number] Yes Take 10 mg by mouth daily. [provider] Taking Active Self  flecainide (TAMBOCOR) 100 MG tablet [id number] Yes Take 100 mg by mouth 2 (two) times daily. [provider] Taking Active   hydrochlorothiazide (HYDRODIURIL) 25 MG tablet [id number] Yes TAKE ONE TABLET BY MOUTH DAILY Einar Pheasant, MD Taking Active   insulin degludec (TRESIBA FLEXTOUCH) 100 UNIT/ML FlexTouch Pen [id number] Yes Inject 22 units daily. Titrate as instructed. Max daily dose 30 units.  Patient taking differently: 26 Units. Inject 26 units daily. Titrate as instructed. Max daily dose 30 units.   Einar Pheasant, MD Taking Active            Med Note Mayo Ao Oct 30, 2021  3:13 PM) 28 units daily  Insulin Pen Needle (PEN NEEDLES) 32G X 4 MM MISC [id number]   Use to inject insulin daily Einar Pheasant, MD  Active   Inulin (FIBER CHOICE PO) [id number] Yes Take 2 capsules by mouth in the morning and at bedtime. [provider] Taking Active   lisinopril (ZESTRIL) 20 MG tablet [id number] Yes TAKE ONE TABLET BY MOUTH DAILY Einar Pheasant, MD Taking Active   metFORMIN (GLUCOPHAGE XR) 500 MG 24 hr tablet [id number] Yes Take 2 tablets (1,000 mg total) by mouth 2 (two) times daily. Einar Pheasant, MD Taking Active   metoprolol tartrate (LOPRESSOR) 25 MG tablet [id number] Yes Take 25 mg by mouth 2 (two) times daily. Teodoro Spray, MD Taking Active   Multiple Vitamin (MULTIVITAMIN) tablet [id number] Yes Take 1 tablet by mouth daily. [provider] Taking Active Self  omeprazole (PRILOSEC) 20 MG capsule [id number] Yes TAKE 1 CAPSULE BY MOUTH TWICE DAILY  Patient taking differently: Take 20 mg by mouth daily.   Einar Pheasant, MD Taking Active            Med Note Nat Christen Nov 19, 2019 11:08 AM) Once daily   oxybutynin (DITROPAN) 5 MG tablet [id number] Yes TAKE ONE TABLET BY MOUTH TWICE A Lemont Fillers, MD Taking Active   Probiotic Product (PROBIOTIC DAILY PO) [id number] Yes Take 1 capsule by mouth daily. [provider] Taking Active   rosuvastatin (CRESTOR) 10 MG tablet [id number] Yes TAKE ONE TABLET BY MOUTH DAILY Einar Pheasant, MD Taking Active  Pertinent Labs:   Lab Results  Component Value Date   HGBA1C 8.1 (H) 06/01/2021   Lab Results  Component Value Date   CHOL 136 06/01/2021   HDL 54.20 06/01/2021   LDLCALC 33 02/08/2021   LDLDIRECT 58.0 06/01/2021   TRIG 213.0 (H) 06/01/2021   CHOLHDL 3 06/01/2021   Lab Results  Component Value Date   CREATININE 0.71 06/01/2021   BUN 25 (H) 06/01/2021   NA 139 06/01/2021   K 4.3 06/01/2021   CL 100 06/01/2021   CO2 27 06/01/2021    SDOH:  (Social Determinants of Health) assessments and interventions performed:  SDOH Interventions     Flowsheet Row Most Recent Value  SDOH Interventions   Financial Strain Interventions Other (Comment)  [over income for [place]  Review of patient past medical history, allergies, medications, health status, including review of consultants reports, laboratory and other test data, was performed as part of comprehensive evaluation and provision of chronic care management services.   Care Plan : Medication Management  Updates made by De Hollingshead, RPH-CPP since 10/30/2021 12:00 AM     Problem: Diabetes, HTN, AFib      Long-Range Goal: Disease Progression Prevention   Recent Progress: On track  Priority: High  Note:   Current Barriers:  Unable to achieve control of diabetes   Pharmacist Clinical Goal(s):  Over the next 90 days, patient will verbalize ability to afford treatment regimen Over the next 90 days, patient will achieve control of diabetes as evidenced by A1c through collaboration with PharmD and provider.  Interventions: 1:1 collaboration with Einar Pheasant, MD regarding development and update of comprehensive plan of care as evidenced by provider attestation and co-signature Inter-disciplinary care team collaboration (see longitudinal plan of care) Comprehensive medication review performed; medication list updated in electronic medical record    Health Maintenance Yearly diabetic eye exam: up to date  Yearly diabetic foot exam: up to date  Urine microalbumin: up to date Yearly influenza vaccination: up to date  Td/Tdap vaccination: due  - previously encouraged to pursue at local pharmacy in 2023 Pneumonia vaccination: up to date  COVID vaccinations: due - previously encouraged bivalent booster dose Shingrix vaccinations: due - previously encouraged to pursue at local pharmacy  Colonoscopy: up to date  Bone density scan: due - encouraged to discuss with Dr. Nicki Reaper  Mammogram: up to date  Diabetes: Uncontrolled; current  treatment: metformin XR 1000 mg BID, Tresiba 28 units daily, Ozempic 0.25 mg weekly - patient took for 2 weeks but then decided she wanted to discontinued. Her glucose readings had been improving through her dietary modifications and she was feeling good from a GI standpoint, did not want to mess that up.  Unable to add SGLT2 due to additional brand copay. Over income for assistance  Current glucose readings: fastings: 120-140s, 2 hour post prandial into 160-180s; Current meal patterns: breakfast: light grain english muffin with egg, canadian bacon, sometimes sugar free strawberry jelly; lunch: sandwich, whole wheat bread, low sodium [place] and ham; sometimes fruit; rarely does cheese due to stomach; snacks: sometimes pack of nabs; supper: pork chops, chicken; baked fish; salads, sometimes potatoes; drinks: water, diet coke; infrequent milk Current physical activity: limited by back pain Patient elects to discontinue Ozempic as she feels her bowels are well controlled at this time.  Discussed goal A1c, goal fasting, goal 2 hour post prandial glucose. Increase Tresiba to 30 units daily. Continue metformin XR 1000  mg twice daily. Patient in agreement  Hyperlipidemia and ASCVD risk reducation: Controlled per last lipid panel; current treatment: rosuvastatin 10 mg daily   Current antiplatelet regimen: aspirin 81 mg daily  Previously recommended to continue current regimen at this time.   Atrial Fibrillation with HTN: Inappropriately managed; current rhythm control: flecainide 100 mg BID, current rate control: metoprolol tartrate 25 mg BID; anticoagulant treatment: none, Dr. Ubaldo Glassing elected to continue aspirin instead of DOAC due to low afib burden. Additional antihypertensives: lisinopril 20 mg daily, HCTZ 25 mg CHADS2VASc = 4.  Previously recommended to continue current regimen at this time. Recommend consideration for DOAC moving forward.  Overactive Bladder: Controlled per patient report, current  treatment; oxybutynin 5 mg BID Moving forward, can consider change to XL formulation to reduce risk of anticholinergic side effects Previously recommended to continue current regimen at this time  Supplements/OTC: Multivitamin, Vitamin D, cetirizine  Patient Goals/Self-Care Activities Over the next 90 days, patient will:  - take medications as prescribed check glucose twice daily, document, and provide at future appointments check blood pressure periodically, document, and provide at future appointments      Plan: Telephone follow up appointment with care management team member scheduled for:  4 months  Catie Darnelle Maffucci, PharmD, [place], CPP Clinical Pharmacist [employer] at [facility] [employer] [facility] [phone]

## 2021-10-30 NOTE — Patient Instructions (Signed)
Christine Robertson,   Christine Robertson to 30 units daily. Continue metformin XR 1000 mg twice daily.   There are a few vaccines we recommend you consider:  - We recommend you get the updated bivalent COVID-19 booster, at least 2 months after any prior doses. You may consider delaying a booster dose by 3 months from a prior episode of COVID-19 per the CDC.  - We recommend the Shingrix (shingles) vaccine series for all over age 68.  - We recommend a Tdap booster every 10 years.   All 3 should have should have a $0 copay. You can pursue this without a prescription at your local pharmacy, or feel free to call our [facility] at [facility] at [phone].  Take care!  [name], PharmD  Visit Information  Following are the goals we discussed today:    Patient Goals/Self-Care Activities Over the next 90 days, patient will:  - take medications as prescribed check glucose twice daily, document, and provide at future appointments check blood pressure periodically, document, and provide at future appointments        Plan: Telephone follow up appointment with care management team member scheduled for:  4 months   [name], PharmD, [place], CPP Clinical Pharmacist [facility] [phone]   Please call the care guide team at [phone] if you need to cancel or reschedule your appointment.   Patient verbalizes understanding of instructions and care plan provided today and agrees to view in MyChart. Active MyChart status confirmed with patient.

## 2021-10-31 DIAGNOSIS — E7800 Pure hypercholesterolemia, unspecified: Secondary | ICD-10-CM | POA: Diagnosis not present

## 2021-10-31 DIAGNOSIS — I4891 Unspecified atrial fibrillation: Secondary | ICD-10-CM

## 2021-10-31 DIAGNOSIS — I10 Essential (primary) hypertension: Secondary | ICD-10-CM | POA: Diagnosis not present

## 2021-10-31 DIAGNOSIS — E1165 Type 2 diabetes mellitus with hyperglycemia: Secondary | ICD-10-CM | POA: Diagnosis not present

## 2021-11-06 ENCOUNTER — Ambulatory Visit: Payer: Self-pay | Admitting: Pharmacist

## 2021-11-06 NOTE — Chronic Care Management (AMB) (Signed)
?  Chronic Care Management  ? ?Note ? ?11/06/2021 ?Name: Christine Robertson MRN: [id number] DOB: [birth year] ? ? ? ?Closing pharmacy CCM case at this time. Will collaborate with Care Guide to outreach to schedule follow up with RN CM. Patient has clinic contact information for future questions or concerns.  ? ?Catie Darnelle Maffucci, PharmD, [place], CPP ?Clinical Pharmacist ?Therapist, music at [facility] ?[phone] ? ?

## 2021-12-12 ENCOUNTER — Other Ambulatory Visit (INDEPENDENT_AMBULATORY_CARE_PROVIDER_SITE_OTHER): Payer: Medicare Other

## 2021-12-12 DIAGNOSIS — E7800 Pure hypercholesterolemia, unspecified: Secondary | ICD-10-CM

## 2021-12-12 DIAGNOSIS — E1165 Type 2 diabetes mellitus with hyperglycemia: Secondary | ICD-10-CM

## 2021-12-12 LAB — BASIC METABOLIC PANEL
BUN: 25 mg/dL — ABNORMAL HIGH (ref 6–23)
CO2: 29 mEq/L (ref 19–32)
Calcium: 9.2 mg/dL (ref 8.4–10.5)
Chloride: 104 mEq/L (ref 96–112)
Creatinine, Ser: 0.69 mg/dL (ref 0.40–1.20)
GFR: 89.64 mL/min (ref >60.00)
Glucose, Bld: 163 mg/dL — ABNORMAL HIGH (ref 70–99)
Potassium: 4 mEq/L (ref 3.5–5.1)
Sodium: 142 mEq/L (ref 135–145)

## 2021-12-12 LAB — LIPID PANEL
Cholesterol: 138 mg/dL (ref 0–200)
HDL: 55.4 mg/dL (ref >39.00)
LDL Cholesterol: 56 mg/dL (ref 0–99)
NonHDL: 82.66
Total CHOL/HDL Ratio: 2
Triglycerides: 132 mg/dL (ref 0.0–149.0)
VLDL: 26.4 mg/dL (ref 0.0–40.0)

## 2021-12-12 LAB — CBC WITH DIFFERENTIAL/PLATELET
Basophils Absolute: 0 10*3/uL (ref 0.0–0.1)
Basophils Relative: 0.4 % (ref 0.0–3.0)
Eosinophils Absolute: 0.2 10*3/uL (ref 0.0–0.7)
Eosinophils Relative: 2.3 % (ref 0.0–5.0)
HCT: 37 % (ref 36.0–46.0)
Hemoglobin: 12.4 g/dL (ref 12.0–15.0)
Lymphocytes Relative: 22.3 % (ref 12.0–46.0)
Lymphs Abs: 1.7 10*3/uL (ref 0.7–4.0)
MCHC: 33.5 g/dL (ref 30.0–36.0)
MCV: 86.5 fl (ref 78.0–100.0)
Monocytes Absolute: 0.5 10*3/uL (ref 0.1–1.0)
Monocytes Relative: 6.1 % (ref 3.0–12.0)
Neutro Abs: 5.4 10*3/uL (ref 1.4–7.7)
Neutrophils Relative %: 68.9 % (ref 43.0–77.0)
Platelets: 215 10*3/uL (ref 150.0–400.0)
RBC: 4.28 Mil/uL (ref 3.87–5.11)
RDW: 13.8 % (ref 11.5–15.5)
WBC: 7.8 10*3/uL (ref 4.0–10.5)

## 2021-12-12 LAB — MICROALBUMIN / CREATININE URINE RATIO
Creatinine,U: 108.9 mg/dL
Microalb Creat Ratio: 2.9 mg/g (ref 0.0–30.0)
Microalb, Ur: 3.1 mg/dL — ABNORMAL HIGH (ref 0.0–1.9)

## 2021-12-12 LAB — HEPATIC FUNCTION PANEL
ALT: 14 U/L (ref 0–35)
AST: 13 U/L (ref 0–37)
Albumin: 4.2 g/dL (ref 3.5–5.2)
Alkaline Phosphatase: 55 U/L (ref 39–117)
Bilirubin, Direct: 0.1 mg/dL (ref 0.0–0.3)
Total Bilirubin: 0.5 mg/dL (ref 0.2–1.2)
Total Protein: 6.5 g/dL (ref 6.0–8.3)

## 2021-12-12 LAB — HEMOGLOBIN A1C: Hgb A1c MFr Bld: 8.5 % — ABNORMAL HIGH (ref 4.6–6.5)

## 2021-12-14 ENCOUNTER — Ambulatory Visit (INDEPENDENT_AMBULATORY_CARE_PROVIDER_SITE_OTHER): Payer: Medicare Other | Admitting: Internal Medicine

## 2021-12-14 ENCOUNTER — Encounter: Payer: Self-pay | Admitting: Internal Medicine

## 2021-12-14 VITALS — BP 136/70 | HR 70 | Temp 98.1°F | Resp 18 | Ht 64.0 in | Wt 278.0 lb

## 2021-12-14 DIAGNOSIS — R269 Unspecified abnormalities of gait and mobility: Secondary | ICD-10-CM | POA: Diagnosis not present

## 2021-12-14 DIAGNOSIS — I10 Essential (primary) hypertension: Secondary | ICD-10-CM | POA: Diagnosis not present

## 2021-12-14 DIAGNOSIS — M79672 Pain in left foot: Secondary | ICD-10-CM

## 2021-12-14 DIAGNOSIS — E7800 Pure hypercholesterolemia, unspecified: Secondary | ICD-10-CM

## 2021-12-14 DIAGNOSIS — R2689 Other abnormalities of gait and mobility: Secondary | ICD-10-CM

## 2021-12-14 DIAGNOSIS — E1165 Type 2 diabetes mellitus with hyperglycemia: Secondary | ICD-10-CM

## 2021-12-14 DIAGNOSIS — R0981 Nasal congestion: Secondary | ICD-10-CM

## 2021-12-14 DIAGNOSIS — I4891 Unspecified atrial fibrillation: Secondary | ICD-10-CM

## 2021-12-14 DIAGNOSIS — G4733 Obstructive sleep apnea (adult) (pediatric): Secondary | ICD-10-CM

## 2021-12-14 DIAGNOSIS — M79671 Pain in right foot: Secondary | ICD-10-CM

## 2021-12-14 DIAGNOSIS — M5489 Other dorsalgia: Secondary | ICD-10-CM

## 2021-12-14 DIAGNOSIS — K219 Gastro-esophageal reflux disease without esophagitis: Secondary | ICD-10-CM

## 2021-12-14 MED ORDER — LEVALBUTEROL TARTRATE 45 MCG/ACT IN AERO: INHALATION_SPRAY | RESPIRATORY_TRACT | 12 refills | Status: DC

## 2021-12-14 MED ORDER — EMPAGLIFLOZIN 10 MG PO TABS: 10.0000 mg | ORAL_TABLET | Freq: Every day | ORAL | 2 refills | Status: DC

## 2021-12-14 NOTE — Progress Notes (Signed)
Patient ID: Christine Robertson, female   DOB: [birth year], 66 y.o.   MRN: [id number] ? ? ?Subjective:  ? ? Patient ID: Christine Robertson, female    DOB: 10/09/1955, 66 y.o.   MRN: [id number] ? ?This visit occurred during the SARS-CoV-2 public health emergency.  Safety protocols were in place, including screening questions prior to the visit, additional usage of staff PPE, and extensive cleaning of exam room while observing appropriate contact time as indicated for disinfecting solutions.  ? ?Patient here for a scheduled follow up.   ? ?Chief Complaint  ?Patient presents with  ? Follow-up  ?  3 mo f/u HTN  ? .  ? ?HPI ?Here to follow up regarding her blood pressure, blood sugar and cholesterol..  saw cardiology 11/09/21 - f/u PAF.  Remains in SR.  On flecainide and metoprolol.  On aspirin.  No chest pain.  Breathing overall stable.  Had cough/congestion three weeks ago.  Taking zyrtec. Improved.  No acid reflux reported.  No abdominal pain.  Taking probiotics and fiber.  Helped stomach.  Off GLP-1 agonist.  Felt like was aggravating GI symptoms.  Blood sugars 140-190 in am and 170-180 pm.  Discussed diet and exercise.  She has not been watching her diet.  Persistent back pain.  Previous PT did not help.  No radicular symptoms (in general).  States may occasionally have some sciatic issues.  Needs rollator to help with walking.  Also discussed referral to Dr Yves Dill.   ? ? ?Past Medical History:  ?Diagnosis Date  ? Abnormal results of thyroid function studies   ? Allergy   ? hay fever  ? Angina pectoris, unspecified (HCC)   ? Arthritis   ? Atrial fibrillation, unspecified   ? Chicken pox   ? Cold hands   ? Congenital anomaly of adrenal gland   ? Diabetes mellitus   ? type 2, uncomplicated  ? Essential hypertension, benign   ? GERD (gastroesophageal reflux disease)   ? IBS (irritable bowel syndrome)   ? Malaise   ? Morbid obesity (HCC)   ? Murmur   ? Pain in soft tissues of limb   ? Proteinuria   ? Pure hypercholesterolemia   ?  Rash and other nonspecific skin eruption   ? Sleep apnea   ? Obstructive; Uses C-Pap machine  ? Urinary incontinence   ? ?Past Surgical History:  ?Procedure Laterality Date  ? ABDOMINAL HYSTERECTOMY  2010  ? BLADDER SURGERY  1967  ? stretching of bladder  ? BREAST BIOPSY Left 03/28/2015  ? done at Dr. Rutherford Nail office. fibrocystic changes  ? COLONOSCOPY    ? COLONOSCOPY WITH PROPOFOL N/A 09/09/2015  ? Procedure: COLONOSCOPY WITH PROPOFOL;  Surgeon: Scot Jun, MD;  Location: [facility] ENDOSCOPY;  Service: Endoscopy;  Laterality: N/A;  ? ESOPHAGOGASTRODUODENOSCOPY    ? TONSILLECTOMY AND ADENOIDECTOMY  1959  ? URETHRAL DILATION    ? ?Family History  ?Problem Relation Age of Onset  ? Diabetes Mother   ? Cancer Father   ?     lung  ? Breast cancer Maternal Grandmother 60  ? Breast cancer Maternal Aunt 12  ? ?Social History  ? ?Socioeconomic History  ? Marital status: Married  ?  Spouse name: Not on file  ? Number of children: Not on file  ? Years of education: Not on file  ? Highest education level: Not on file  ?Occupational History  ? Not on file  ?Tobacco Use  ? Smoking status: Never  ?  Smokeless tobacco: Never  ?Substance and Sexual Activity  ? Alcohol use: No  ?  Alcohol/week: 0.0 standard drinks  ? Drug use: No  ? Sexual activity: Not on file  ?Other Topics Concern  ? Not on file  ?Social History Narrative  ? Not on file  ? ?Social Determinants of Health  ? ?Financial Resource Strain: Medium Risk  ? Difficulty of Paying Living Expenses: Somewhat hard  ?Food Insecurity: No Food Insecurity  ? Worried About Programme researcher, broadcasting/film/video in the Last Year: Never true  ? Ran Out of Food in the Last Year: Never true  ?Transportation Needs: No Transportation Needs  ? Lack of Transportation (Medical): No  ? Lack of Transportation (Non-Medical): No  ?Physical Activity: Not on file  ?Stress: No Stress Concern Present  ? Feeling of Stress : Not at all  ?Social Connections: Unknown  ? Frequency of Communication with Friends and Family:  Not on file  ? Frequency of Social Gatherings with Friends and Family: Not on file  ? Attends Religious Services: Not on file  ? Active Member of Clubs or Organizations: Not on file  ? Attends Banker Meetings: Not on file  ? Marital Status: Married  ? ? ? ?Review of Systems  ?Constitutional:  Negative for appetite change and unexpected weight change.  ?HENT:  Positive for congestion. Negative for sinus pressure.   ?Respiratory:  Negative for chest tightness.   ?     No increased sob.  Some occasional dry cough.   ?Cardiovascular:  Negative for chest pain and palpitations.  ?     No increased swelling.  Stable - swelling.   ?Gastrointestinal:  Negative for abdominal pain, diarrhea, nausea and vomiting.  ?Genitourinary:  Negative for difficulty urinating and dysuria.  ?Musculoskeletal:  Positive for back pain. Negative for myalgias.  ?Skin:  Negative for color change and rash.  ?Neurological:  Negative for dizziness, light-headedness and headaches.  ?Psychiatric/Behavioral:  Negative for agitation and dysphoric mood.   ? ?   ?Objective:  ?  ? ?BP 136/70 (BP Location: Left Arm, Patient Position: Sitting, Cuff Size: Large)   Pulse 70   Temp 98.1 ?F (36.7 ?C) (Temporal)   Resp 18   Ht 5\' 4"  (1.626 m)   Wt 278 lb (126.1 kg)   SpO2 96%   BMI 47.72 kg/m?  ?Wt Readings from Last 3 Encounters:  ?12/14/21 278 lb (126.1 kg)  ?10/30/21 275 lb (124.7 kg)  ?09/21/21 283 lb (128.4 kg)  ? ? ?Physical Exam ?Vitals reviewed.  ?Constitutional:   ?   General: She is not in acute distress. ?   Appearance: Normal appearance.  ?HENT:  ?   Head: Normocephalic and atraumatic.  ?   Right Ear: External ear normal.  ?   Left Ear: External ear normal.  ?Eyes:  ?   General: No scleral icterus.    ?   Right eye: No discharge.     ?   Left eye: No discharge.  ?   Conjunctiva/sclera: Conjunctivae normal.  ?Neck:  ?   Thyroid: No thyromegaly.  ?Cardiovascular:  ?   Rate and Rhythm: Normal rate and regular rhythm.  ?Pulmonary:  ?    Effort: No respiratory distress.  ?   Breath sounds: Normal breath sounds. No wheezing.  ?Abdominal:  ?   General: Bowel sounds are normal.  ?   Palpations: Abdomen is soft.  ?   Tenderness: There is no abdominal tenderness.  ?Musculoskeletal:     ?  General: No tenderness.  ?   Cervical back: Neck supple. No tenderness.  ?   Comments: Chronic stable swelling.   ?Lymphadenopathy:  ?   Cervical: No cervical adenopathy.  ?Skin: ?   Findings: No erythema or rash.  ?Neurological:  ?   Mental Status: She is alert.  ?Psychiatric:     ?   Mood and Affect: Mood normal.     ?   Behavior: Behavior normal.  ? ? ? ?Outpatient Encounter Medications as of 12/14/2021  ?Medication Sig  ? Ascorbic Acid (VITAMIN C) 1000 MG tablet Take 1,000 mg by mouth daily.  ? aspirin 81 MG tablet Take 81 mg by mouth daily.  ? cetirizine (ZYRTEC) 10 MG tablet Take 10 mg by mouth daily.  ? empagliflozin (JARDIANCE) 10 MG TABS tablet Take 1 tablet (10 mg total) by mouth daily before breakfast.  ? flecainide (TAMBOCOR) 100 MG tablet Take 100 mg by mouth 2 (two) times daily.  ? hydrochlorothiazide (HYDRODIURIL) 25 MG tablet TAKE ONE TABLET BY MOUTH DAILY  ? insulin degludec (TRESIBA FLEXTOUCH) 100 UNIT/ML FlexTouch Pen Inject 22 units daily. Titrate as instructed. Max daily dose 30 units. (Patient taking differently: 26 Units. Inject 26 units daily. Titrate as instructed. Max daily dose 30 units.)  ? Insulin Pen Needle (PEN NEEDLES) 32G X 4 MM MISC Use to inject insulin daily  ? levalbuterol (XOPENEX HFA) 45 MCG/ACT inhaler 1-2 puffs tid prn  ? lisinopril (ZESTRIL) 20 MG tablet TAKE ONE TABLET BY MOUTH DAILY  ? metFORMIN (GLUCOPHAGE XR) 500 MG 24 hr tablet Take 2 tablets (1,000 mg total) by mouth 2 (two) times daily.  ? metoprolol tartrate (LOPRESSOR) 25 MG tablet Take 25 mg by mouth 2 (two) times daily.  ? Multiple Vitamin (MULTIVITAMIN) tablet Take 1 tablet by mouth daily.  ? omeprazole (PRILOSEC) 20 MG capsule TAKE 1 CAPSULE BY MOUTH TWICE DAILY  (Patient taking differently: Take 20 mg by mouth daily.)  ? oxybutynin (DITROPAN) 5 MG tablet TAKE ONE TABLET BY MOUTH TWICE A DAY  ? Probiotic Product (PROBIOTIC DAILY PO) Take 1 capsule by mouth daily.  ? rosuv

## 2021-12-17 NOTE — Assessment & Plan Note (Signed)
Saline nasal spray/steroid nasal spray.  Zyrtec.  Follow.   ?

## 2021-12-17 NOTE — Assessment & Plan Note (Signed)
Off ozempic.  On tresiba and metformin.  Sugars remain elevated.  Gaining weight.  Seeing GI.  Diagnosed with gastroparesis.  Reviewed recent GI note.  Per GI, not opposed to her restarting ozempic.  Discussed.  She feels ozempic aggravated her GI issues.  Discussed other treatment options.  Will add farxiga.  Help with CKD and sugars.  Follow sugars.  Follow met b and a1c.  ?

## 2021-12-17 NOTE — Assessment & Plan Note (Signed)
Continue lisinopril, hctz and metoprolol.  Blood pressure doing well.  Continue current medication.  Follow pressures.  

## 2021-12-17 NOTE — Assessment & Plan Note (Signed)
On flecaininde, metoprolol and aspirin.  Appears to be in SR. Stable.   

## 2021-12-17 NOTE — Assessment & Plan Note (Signed)
On crestor.  Low cholesterol diet and exercise.  Follow lipid panel and liver function tests.   

## 2021-12-17 NOTE — Assessment & Plan Note (Signed)
Persistent low back pain.  Has had xray.  Has been to PT.  Persistent pain.  rx for rollator walker.  Request referral to Dr Yves Dill.   ?

## 2021-12-17 NOTE — Assessment & Plan Note (Signed)
CPAP.  

## 2021-12-17 NOTE — Assessment & Plan Note (Signed)
Taking omeprazole 20mg q day.  No acid reflux reported.  

## 2022-01-09 ENCOUNTER — Other Ambulatory Visit: Payer: Self-pay | Admitting: Internal Medicine

## 2022-01-09 ENCOUNTER — Other Ambulatory Visit: Payer: Self-pay | Admitting: Family Medicine

## 2022-01-09 DIAGNOSIS — E1165 Type 2 diabetes mellitus with hyperglycemia: Secondary | ICD-10-CM

## 2022-01-09 DIAGNOSIS — M5416 Radiculopathy, lumbar region: Secondary | ICD-10-CM

## 2022-02-08 ENCOUNTER — Ambulatory Visit (INDEPENDENT_AMBULATORY_CARE_PROVIDER_SITE_OTHER): Payer: Medicare Other | Admitting: Internal Medicine

## 2022-02-08 ENCOUNTER — Encounter: Payer: Self-pay | Admitting: Internal Medicine

## 2022-02-08 DIAGNOSIS — G4733 Obstructive sleep apnea (adult) (pediatric): Secondary | ICD-10-CM | POA: Diagnosis not present

## 2022-02-08 DIAGNOSIS — K219 Gastro-esophageal reflux disease without esophagitis: Secondary | ICD-10-CM

## 2022-02-08 DIAGNOSIS — I872 Venous insufficiency (chronic) (peripheral): Secondary | ICD-10-CM | POA: Diagnosis not present

## 2022-02-08 DIAGNOSIS — I10 Essential (primary) hypertension: Secondary | ICD-10-CM | POA: Diagnosis not present

## 2022-02-08 DIAGNOSIS — M5489 Other dorsalgia: Secondary | ICD-10-CM

## 2022-02-08 DIAGNOSIS — E1165 Type 2 diabetes mellitus with hyperglycemia: Secondary | ICD-10-CM

## 2022-02-08 DIAGNOSIS — I4891 Unspecified atrial fibrillation: Secondary | ICD-10-CM

## 2022-02-08 DIAGNOSIS — E7800 Pure hypercholesterolemia, unspecified: Secondary | ICD-10-CM

## 2022-02-08 NOTE — Progress Notes (Unsigned)
Patient ID: Christine Robertson, female   DOB: [birth year], 66 y.o.   MRN: [id number]   Subjective:    Patient ID: Christine Robertson, female    DOB: [birth year], 66 y.o.   MRN: [id number]  This visit occurred during the SARS-CoV-2 public health emergency.  Safety protocols were in place, including screening questions prior to the visit, additional usage of staff PPE, and extensive cleaning of exam room while observing appropriate contact time as indicated for disinfecting solutions.   Patient here for  Chief Complaint  Patient presents with   Follow-up   Hyperlipidemia   Hypertension   Diabetes   .   HPI    Past Medical History:  Diagnosis Date   Abnormal results of thyroid function studies    Allergy    hay fever   Angina pectoris, unspecified (HCC)    Arthritis    Atrial fibrillation, unspecified    Chicken pox    Cold hands    Congenital anomaly of adrenal gland    Diabetes mellitus    type 2, uncomplicated   Essential hypertension, benign    GERD (gastroesophageal reflux disease)    IBS (irritable bowel syndrome)    Malaise    Morbid obesity (HCC)    Murmur    Pain in soft tissues of limb    Proteinuria    Pure hypercholesterolemia    Rash and other nonspecific skin eruption    Sleep apnea    Obstructive; Uses C-Pap machine   Urinary incontinence    Past Surgical History:  Procedure Laterality Date   ABDOMINAL HYSTERECTOMY  2010   BLADDER SURGERY  1967   stretching of bladder   BREAST BIOPSY Left 03/28/2015   done at Dr. Rutherford Nail office. fibrocystic changes   COLONOSCOPY     COLONOSCOPY WITH PROPOFOL N/A 09/09/2015   Procedure: COLONOSCOPY WITH PROPOFOL;  Surgeon: Scot Jun, MD;  Location: [facility] ENDOSCOPY;  Service: Endoscopy;  Laterality: N/A;   ESOPHAGOGASTRODUODENOSCOPY     TONSILLECTOMY AND ADENOIDECTOMY  1959   URETHRAL DILATION     Family History  Problem Relation Age of Onset   Diabetes Mother    Cancer Father        lung   Breast cancer Maternal  Grandmother 37   Breast cancer Maternal Aunt 75   Social History   Socioeconomic History   Marital status: Married    Spouse name: Not on file   Number of children: Not on file   Years of education: Not on file   Highest education level: Not on file  Occupational History   Not on file  Tobacco Use   Smoking status: Never   Smokeless tobacco: Never  Substance and Sexual Activity   Alcohol use: No    Alcohol/week: 0.0 standard drinks of alcohol   Drug use: No   Sexual activity: Not on file  Other Topics Concern   Not on file  Social History Narrative   Not on file   Social Determinants of Health   Financial Resource Strain: Medium Risk (10/30/2021)   Overall Financial Resource Strain (CARDIA)    Difficulty of Paying Living Expenses: Somewhat hard  Food Insecurity: No Food Insecurity (09/21/2021)   Hunger Vital Sign    Worried About Running Out of Food in the Last Year: Never true    Ran Out of Food in the Last Year: Never true  Transportation Needs: No Transportation Needs (09/21/2021)   PRAPARE - Transportation    Lack of Transportation (  Medical): No    Lack of Transportation (Non-Medical): No  Physical Activity: Not on file  Stress: No Stress Concern Present (09/21/2021)   [employer] - Occupational Stress Questionnaire    Feeling of Stress : Not at all  Social Connections: Unknown (09/21/2021)   Social Connection and Isolation Panel [NHANES]    Frequency of Communication with Friends and Family: Not on file    Frequency of Social Gatherings with Friends and Family: Not on file    Attends Religious Services: Not on file    Active Member of Clubs or Organizations: Not on file    Attends Engineer, structuralClub or Organization Meetings: Not on file    Marital Status: Married     Review of Systems     Objective:     BP 134/70 (BP Location: Left Arm, Patient Position: Sitting, Cuff Size: Large)   Pulse 68   Temp 98.5 F (36.9 C) (Temporal)   Resp 15    Ht 5\' 4"  (1.626 m)   Wt 285 lb 9.6 oz (129.5 kg)   SpO2 99%   BMI 49.02 kg/m  Wt Readings from Last 3 Encounters:  02/08/22 285 lb 9.6 oz (129.5 kg)  12/14/21 278 lb (126.1 kg)  10/30/21 275 lb (124.7 kg)    Physical Exam   Outpatient Encounter Medications as of 02/08/2022  Medication Sig   Ascorbic Acid (VITAMIN C) 1000 MG tablet Take 1,000 mg by mouth daily.   aspirin 81 MG tablet Take 81 mg by mouth daily.   cetirizine (ZYRTEC) 10 MG tablet Take 10 mg by mouth daily.   empagliflozin (JARDIANCE) 10 MG TABS tablet Take 1 tablet (10 mg total) by mouth daily before breakfast.   flecainide (TAMBOCOR) 100 MG tablet Take 100 mg by mouth 2 (two) times daily.   hydrochlorothiazide (HYDRODIURIL) 25 MG tablet TAKE ONE TABLET BY MOUTH DAILY   insulin degludec (TRESIBA FLEXTOUCH) 100 UNIT/ML FlexTouch Pen INJECT 10 UNITS SUB-Q DAILY AND TITRATE AS DIRECTED UP TO A MAXIMUM DOSE OF 30 UNITS   Insulin Pen Needle (PEN NEEDLES) 32G X 4 MM MISC Use to inject insulin daily   levalbuterol (XOPENEX HFA) 45 MCG/ACT inhaler 1-2 puffs tid prn   lisinopril (ZESTRIL) 20 MG tablet TAKE ONE TABLET BY MOUTH DAILY   metFORMIN (GLUCOPHAGE XR) 500 MG 24 hr tablet Take 2 tablets (1,000 mg total) by mouth 2 (two) times daily.   metoprolol tartrate (LOPRESSOR) 25 MG tablet Take 25 mg by mouth 2 (two) times daily.   Multiple Vitamin (MULTIVITAMIN) tablet Take 1 tablet by mouth daily.   omeprazole (PRILOSEC) 20 MG capsule TAKE 1 CAPSULE BY MOUTH TWICE DAILY (Patient taking differently: Take 20 mg by mouth daily.)   oxybutynin (DITROPAN) 5 MG tablet TAKE ONE TABLET BY MOUTH TWICE A DAY   Probiotic Product (PROBIOTIC DAILY PO) Take 1 capsule by mouth daily.   rosuvastatin (CRESTOR) 10 MG tablet TAKE ONE TABLET BY MOUTH DAILY   No facility-administered encounter medications on file as of 02/08/2022.     Lab Results  Component Value Date   WBC 7.8 12/12/2021   HGB 12.4 12/12/2021   HCT 37.0 12/12/2021   PLT 215.0  12/12/2021   GLUCOSE 163 (H) 12/12/2021   CHOL 138 12/12/2021   TRIG 132.0 12/12/2021   HDL 55.40 12/12/2021   LDLDIRECT 58.0 06/01/2021   LDLCALC 56 12/12/2021   ALT 14 12/12/2021   AST 13 12/12/2021   NA 142 12/12/2021   K 4.0 12/12/2021  CL 104 12/12/2021   CREATININE 0.69 12/12/2021   BUN 25 (H) 12/12/2021   CO2 29 12/12/2021   TSH 0.71 02/08/2021   INR 0.99 11/15/2012   HGBA1C 8.5 (H) 12/12/2021   MICROALBUR 3.1 (H) 12/12/2021    NM GASTRIC EMPTYING  Result Date: 06/03/2021 CLINICAL DATA:  Postprandial abdominal pain EXAM: NUCLEAR MEDICINE GASTRIC EMPTYING SCAN TECHNIQUE: After oral ingestion of radiolabeled meal, sequential abdominal images were obtained for 4 hours. Percentage of activity emptying the stomach was calculated at 1 hour, 2 hour, 3 hour, and 4 hours. RADIOPHARMACEUTICALS:  2.33 mCi Tc-24m choose well in standardized meal COMPARISON:  None. FINDINGS: Expected location of the stomach in the left upper quadrant. Ingested meal empties the stomach gradually over the course of the study. 26% emptied at 1 hr ( normal >= 10%) 40% emptied at 2 hr ( normal >= 40%) 41% emptied at 3 hr ( normal >= 70%) 70% emptied at 4 hr ( normal >= 90%) IMPRESSION: Initial emptying to 2 hours is within normal limits. The delayed 3 and 4 hour gastric emptying is less than expected. Electronically Signed   By: Alcide Clever M.D.   On: 06/03/2021 02:27       Assessment & Plan:   Problem List Items Addressed This Visit   None    Dale [place], MD

## 2022-02-12 NOTE — Assessment & Plan Note (Signed)
On crestor.  Low cholesterol diet and exercise.  Follow lipid panel and liver function tests.   

## 2022-02-12 NOTE — Assessment & Plan Note (Signed)
CPAP.  

## 2022-02-12 NOTE — Assessment & Plan Note (Signed)
Taking omeprazole 20mg q day.  No acid reflux reported.  

## 2022-02-12 NOTE — Assessment & Plan Note (Signed)
On metformin, tresiba and jardiance.  Tolerating jardiance.  Sugars - am - 137-157, 140-185 in pm.  Discussed low carb diet and exercise.  Follow met b and a1c.

## 2022-02-12 NOTE — Assessment & Plan Note (Signed)
On flecaininde, metoprolol and aspirin.  Appears to be in SR. Stable.   

## 2022-02-12 NOTE — Assessment & Plan Note (Signed)
Seeing Dr Mickeal Needy Meeler.  Started gabapentin.  Follow.  Discussed ESI.  Back limiting movement and activity.  Plans to discuss with Whitney.

## 2022-02-12 NOTE — Assessment & Plan Note (Signed)
Lymphedema pump 

## 2022-02-12 NOTE — Assessment & Plan Note (Signed)
Continue lisinopril, hctz and metoprolol.  Blood pressure doing well.  Continue current medication.  Follow pressures.  

## 2022-02-15 LAB — HM DIABETES EYE EXAM

## 2022-02-28 ENCOUNTER — Other Ambulatory Visit: Payer: Self-pay | Admitting: Internal Medicine

## 2022-02-28 DIAGNOSIS — E1165 Type 2 diabetes mellitus with hyperglycemia: Secondary | ICD-10-CM

## 2022-03-01 ENCOUNTER — Telehealth: Payer: Medicare Other

## 2022-03-06 ENCOUNTER — Other Ambulatory Visit: Payer: Self-pay | Admitting: Internal Medicine

## 2022-03-19 ENCOUNTER — Other Ambulatory Visit (INDEPENDENT_AMBULATORY_CARE_PROVIDER_SITE_OTHER): Payer: Medicare Other

## 2022-03-19 DIAGNOSIS — E1165 Type 2 diabetes mellitus with hyperglycemia: Secondary | ICD-10-CM | POA: Diagnosis not present

## 2022-03-19 DIAGNOSIS — E7800 Pure hypercholesterolemia, unspecified: Secondary | ICD-10-CM | POA: Diagnosis not present

## 2022-03-19 DIAGNOSIS — I10 Essential (primary) hypertension: Secondary | ICD-10-CM

## 2022-03-19 LAB — BASIC METABOLIC PANEL
BUN: 28 mg/dL — ABNORMAL HIGH (ref 6–23)
CO2: 26 mEq/L (ref 19–32)
Calcium: 9.8 mg/dL (ref 8.4–10.5)
Chloride: 104 mEq/L (ref 96–112)
Creatinine, Ser: 0.75 mg/dL (ref 0.40–1.20)
GFR: 82.08 mL/min (ref >60.00)
Glucose, Bld: 171 mg/dL — ABNORMAL HIGH (ref 70–99)
Potassium: 4.3 mEq/L (ref 3.5–5.1)
Sodium: 142 mEq/L (ref 135–145)

## 2022-03-19 LAB — HEPATIC FUNCTION PANEL
ALT: 18 U/L (ref 0–35)
AST: 15 U/L (ref 0–37)
Albumin: 4.2 g/dL (ref 3.5–5.2)
Alkaline Phosphatase: 54 U/L (ref 39–117)
Bilirubin, Direct: 0.1 mg/dL (ref 0.0–0.3)
Total Bilirubin: 0.4 mg/dL (ref 0.2–1.2)
Total Protein: 6.7 g/dL (ref 6.0–8.3)

## 2022-03-19 LAB — LIPID PANEL
Cholesterol: 130 mg/dL (ref 0–200)
HDL: 56.3 mg/dL (ref >39.00)
LDL Cholesterol: 46 mg/dL (ref 0–99)
NonHDL: 73.95
Total CHOL/HDL Ratio: 2
Triglycerides: 138 mg/dL (ref 0.0–149.0)
VLDL: 27.6 mg/dL (ref 0.0–40.0)

## 2022-03-19 LAB — TSH: TSH: 0.53 u[IU]/mL (ref 0.35–5.50)

## 2022-03-19 LAB — HEMOGLOBIN A1C: Hgb A1c MFr Bld: 8.3 % — ABNORMAL HIGH (ref 4.6–6.5)

## 2022-03-22 ENCOUNTER — Ambulatory Visit (INDEPENDENT_AMBULATORY_CARE_PROVIDER_SITE_OTHER): Payer: Medicare Other | Admitting: Internal Medicine

## 2022-03-22 ENCOUNTER — Encounter: Payer: Self-pay | Admitting: Internal Medicine

## 2022-03-22 DIAGNOSIS — I10 Essential (primary) hypertension: Secondary | ICD-10-CM | POA: Diagnosis not present

## 2022-03-22 DIAGNOSIS — E1165 Type 2 diabetes mellitus with hyperglycemia: Secondary | ICD-10-CM

## 2022-03-22 DIAGNOSIS — G4733 Obstructive sleep apnea (adult) (pediatric): Secondary | ICD-10-CM | POA: Diagnosis not present

## 2022-03-22 DIAGNOSIS — I4891 Unspecified atrial fibrillation: Secondary | ICD-10-CM | POA: Diagnosis not present

## 2022-03-22 DIAGNOSIS — K219 Gastro-esophageal reflux disease without esophagitis: Secondary | ICD-10-CM

## 2022-03-22 DIAGNOSIS — E7800 Pure hypercholesterolemia, unspecified: Secondary | ICD-10-CM

## 2022-03-22 DIAGNOSIS — D649 Anemia, unspecified: Secondary | ICD-10-CM

## 2022-03-22 MED ORDER — EMPAGLIFLOZIN 25 MG PO TABS: 25.0000 mg | ORAL_TABLET | Freq: Every day | ORAL | 2 refills | Status: DC

## 2022-03-22 NOTE — Progress Notes (Signed)
Patient ID: Christine Robertson, female   DOB: February 07, 1956, 66 y.o.   MRN: [id number]   Subjective:    Patient ID: Christine Robertson, female    DOB: 1956-04-16, 66 y.o.   MRN: [id number]   Patient here for a scheduled follow up .   HPI Here to follow up regarding her afib, diabetes. Blood pressure and cholesterol.  Reports she is doing relatively well.  No chest pain.  Breathing overall stable.  Did have left arm pain for about three weeks.  Could not lift her arm.  Movement aggravated.  Better now.  Discussed labs.  A1c 8.3.  slightly decreased from last.  Discussed low carb diet and exercise.  No abdominal pain.  Bowels moving.  States she has not been watching her diet as well.     Past Medical History:  Diagnosis Date   Abnormal results of thyroid function studies    Allergy    hay fever   Angina pectoris, unspecified (HCC)    Arthritis    Atrial fibrillation, unspecified    Chicken pox    Cold hands    Congenital anomaly of adrenal gland    Diabetes mellitus    type 2, uncomplicated   Essential hypertension, benign    GERD (gastroesophageal reflux disease)    IBS (irritable bowel syndrome)    Malaise    Morbid obesity (HCC)    Murmur    Pain in soft tissues of limb    Proteinuria    Pure hypercholesterolemia    Rash and other nonspecific skin eruption    Sleep apnea    Obstructive; Uses C-Pap machine   Urinary incontinence    Past Surgical History:  Procedure Laterality Date   ABDOMINAL HYSTERECTOMY  2010   BLADDER SURGERY  1967   stretching of bladder   BREAST BIOPSY Left 03/28/2015   done at Dr. Dwyane Luo office. fibrocystic changes   COLONOSCOPY     COLONOSCOPY WITH PROPOFOL N/A 09/09/2015   Procedure: COLONOSCOPY WITH PROPOFOL;  Surgeon: Manya Silvas, MD;  Location: [facility] ENDOSCOPY;  Service: Endoscopy;  Laterality: N/A;   ESOPHAGOGASTRODUODENOSCOPY     TONSILLECTOMY AND ADENOIDECTOMY  1959   URETHRAL DILATION     Family History  Problem Relation Age of Onset    Diabetes Mother    Cancer Father        lung   Breast cancer Maternal Grandmother 49   Breast cancer Maternal Aunt 67   Social History   Socioeconomic History   Marital status: Married    Spouse name: Not on file   Number of children: Not on file   Years of education: Not on file   Highest education level: Not on file  Occupational History   Not on file  Tobacco Use   Smoking status: Never   Smokeless tobacco: Never  Substance and Sexual Activity   Alcohol use: No    Alcohol/week: 0.0 standard drinks of alcohol   Drug use: No   Sexual activity: Not on file  Other Topics Concern   Not on file  Social History Narrative   Not on file   Social Determinants of Health   Financial Resource Strain: Medium Risk (10/30/2021)   Overall Financial Resource Strain (CARDIA)    Difficulty of Paying Living Expenses: Somewhat hard  Food Insecurity: No Food Insecurity (09/21/2021)   Hunger Vital Sign    Worried About Running Out of Food in the Last Year: Never true    Ran Out of  Food in the Last Year: Never true  Transportation Needs: No Transportation Needs (09/21/2021)   PRAPARE - Hydrologist (Medical): No    Lack of Transportation (Non-Medical): No  Physical Activity: Unknown (04/12/2020)   Exercise Vital Sign    Days of Exercise per Week: Not on file    Minutes of Exercise per Session: 0 min  Stress: No Stress Concern Present (09/21/2021)   [place]    Feeling of Stress : Not at all  Social Connections: Unknown (09/21/2021)   Social Connection and Isolation Panel [NHANES]    Frequency of Communication with Friends and Family: Not on file    Frequency of Social Gatherings with Friends and Family: Not on file    Attends Religious Services: Not on file    Active Member of Clubs or Organizations: Not on file    Attends Archivist Meetings: Not on file    Marital Status: Married      Review of Systems  Constitutional:  Negative for appetite change and unexpected weight change.  HENT:  Negative for congestion and sinus pressure.   Respiratory:  Negative for cough, chest tightness and shortness of breath.        Breathing improved.   Cardiovascular:  Negative for chest pain and palpitations.       No increased swelling.   Gastrointestinal:  Negative for abdominal pain, diarrhea, nausea and vomiting.  Genitourinary:  Negative for difficulty urinating and dysuria.  Musculoskeletal:  Negative for joint swelling and myalgias.  Skin:  Negative for color change and rash.  Neurological:  Negative for dizziness, light-headedness and headaches.  Psychiatric/Behavioral:  Negative for agitation and dysphoric mood.        Objective:     BP 134/70 (BP Location: Left Arm, Patient Position: Sitting, Cuff Size: Large)   Pulse 67   Temp 98.6 F (37 C) (Temporal)   Resp 17   Ht _0  (1.626 m)   Wt 288 lb 12.8 oz (131 kg)   SpO2 95%   BMI 49.57 kg/m  Wt Readings from Last 3 Encounters:  03/22/22 288 lb 12.8 oz (131 kg)  02/08/22 285 lb 9.6 oz (129.5 kg)  12/14/21 278 lb (126.1 kg)    Physical Exam Vitals reviewed.  Constitutional:      General: She is not in acute distress.    Appearance: Normal appearance.  HENT:     Head: Normocephalic and atraumatic.     Right Ear: External ear normal.     Left Ear: External ear normal.  Eyes:     General: No scleral icterus.       Right eye: No discharge.        Left eye: No discharge.     Conjunctiva/sclera: Conjunctivae normal.  Neck:     Thyroid: No thyromegaly.  Cardiovascular:     Rate and Rhythm: Normal rate.     Comments: Rate controlled.  Pulmonary:     Effort: No respiratory distress.     Breath sounds: Normal breath sounds. No wheezing.  Abdominal:     General: Bowel sounds are normal.     Palpations: Abdomen is soft.     Tenderness: There is no abdominal tenderness.  Musculoskeletal:         General: No tenderness.     Cervical back: Neck supple. No tenderness.     Comments: No increased swelling.   Lymphadenopathy:  Cervical: No cervical adenopathy.  Skin:    Findings: No erythema or rash.  Neurological:     Mental Status: She is alert.  Psychiatric:        Mood and Affect: Mood normal.        Behavior: Behavior normal.      Outpatient Encounter Medications as of 03/22/2022  Medication Sig   Ascorbic Acid (VITAMIN C) 1000 MG tablet Take 1,000 mg by mouth daily.   aspirin 81 MG tablet Take 81 mg by mouth daily.   cetirizine (ZYRTEC) 10 MG tablet Take 10 mg by mouth daily.   empagliflozin (JARDIANCE) 25 MG TABS tablet Take 1 tablet (25 mg total) by mouth daily before breakfast.   flecainide (TAMBOCOR) 100 MG tablet Take 100 mg by mouth 2 (two) times daily.   hydrochlorothiazide (HYDRODIURIL) 25 MG tablet TAKE ONE TABLET BY MOUTH DAILY   insulin degludec (TRESIBA FLEXTOUCH) 100 UNIT/ML FlexTouch Pen INJECT 10 UNITS SUB-Q DAILY AND TITRATE AS DIRECTED UP TO A MAXIMUM DOSE OF 30 UNITS   Insulin Pen Needle (PEN NEEDLES) 32G X 4 MM MISC Use to inject insulin daily   levalbuterol (XOPENEX HFA) 45 MCG/ACT inhaler 1-2 puffs tid prn   lisinopril (ZESTRIL) 20 MG tablet TAKE ONE TABLET BY MOUTH DAILY   metFORMIN (GLUCOPHAGE-XR) 500 MG 24 hr tablet TAKE TWO TABLETS BY MOUTH TWICE A DAY   metoprolol tartrate (LOPRESSOR) 25 MG tablet Take 25 mg by mouth 2 (two) times daily.   Multiple Vitamin (MULTIVITAMIN) tablet Take 1 tablet by mouth daily.   omeprazole (PRILOSEC) 20 MG capsule TAKE 1 CAPSULE BY MOUTH TWICE DAILY (Patient taking differently: Take 20 mg by mouth daily.)   oxybutynin (DITROPAN) 5 MG tablet TAKE ONE TABLET BY MOUTH TWICE A DAY   Probiotic Product (PROBIOTIC DAILY PO) Take 1 capsule by mouth daily.   rosuvastatin (CRESTOR) 10 MG tablet TAKE ONE TABLET BY MOUTH DAILY   [DISCONTINUED] JARDIANCE 10 MG TABS tablet TAKE ONE TABLET BY MOUTH DAILY BEFORE BREAKFAST   No  facility-administered encounter medications on file as of 03/22/2022.     Lab Results  Component Value Date   WBC 7.8 12/12/2021   HGB 12.4 12/12/2021   HCT 37.0 12/12/2021   PLT 215.0 12/12/2021   GLUCOSE 171 (H) 03/19/2022   CHOL 130 03/19/2022   TRIG 138.0 03/19/2022   HDL 56.30 03/19/2022   LDLDIRECT 58.0 06/01/2021   LDLCALC 46 03/19/2022   ALT 18 03/19/2022   AST 15 03/19/2022   NA 142 03/19/2022   K 4.3 03/19/2022   CL 104 03/19/2022   CREATININE 0.75 03/19/2022   BUN 28 (H) 03/19/2022   CO2 26 03/19/2022   TSH 0.53 03/19/2022   INR 0.99 11/15/2012   HGBA1C 8.3 (H) 03/19/2022   MICROALBUR 3.1 (H) 12/12/2021    NM GASTRIC EMPTYING  Result Date: 06/03/2021 CLINICAL DATA:  Postprandial abdominal pain EXAM: NUCLEAR MEDICINE GASTRIC EMPTYING SCAN TECHNIQUE: After oral ingestion of radiolabeled meal, sequential abdominal images were obtained for 4 hours. Percentage of activity emptying the stomach was calculated at 1 hour, 2 hour, 3 hour, and 4 hours. RADIOPHARMACEUTICALS:  2.33 mCi Tc-24mchoose well in standardized meal COMPARISON:  None. FINDINGS: Expected location of the stomach in the left upper quadrant. Ingested meal empties the stomach gradually over the course of the study. 26% emptied at 1 hr ( normal >= 10%) 40% emptied at 2 hr ( normal >= 40%) 41% emptied at 3 hr ( normal >= 70%)  70% emptied at 4 hr ( normal >= 90%) IMPRESSION: Initial emptying to 2 hours is within normal limits. The delayed 3 and 4 hour gastric emptying is less than expected. Electronically Signed   By: Inez Catalina M.D.   On: 06/03/2021 02:27       Assessment & Plan:   Problem List Items Addressed This Visit     Anemia    hgb stable 11.4.  Colonoscopy 2017.  Recommended f/u in 5 years.  Overdue.       Atrial fibrillation (HCC)    On flecaininde, metoprolol and aspirin.  Appears to be in SR. Stable.        Diabetes mellitus ([place])    On metformin, tresiba and jardiance.  Tolerating  jardiance.  Discussed low carb diet and exercise.  Follow met b and a1c.a1c has improved slightly.  Will increase jardiance to 39m q day.  Instructed to stay hydrated.  Monitor of urinary or yeast infections.         Relevant Medications   empagliflozin (JARDIANCE) 25 MG TABS tablet   Other Relevant Orders   Hemoglobin A1c   GERD (gastroesophageal reflux disease)    Taking omeprazole 243mq day.  No acid reflux reported.       Hypercholesteremia    On crestor.  Low cholesterol diet and exercise.  Follow lipid panel and liver function tests.        Relevant Orders   Hepatic function panel   Lipid panel   Hypertension    Continue lisinopril, hctz and metoprolol.  Blood pressure doing well.  Continue current medication.  Follow pressures.       Relevant Orders   Basic metabolic panel   Obstructive sleep apnea    CPAP.         ChEinar PheasantMD

## 2022-03-26 ENCOUNTER — Encounter: Payer: Self-pay | Admitting: Internal Medicine

## 2022-03-26 DIAGNOSIS — D649 Anemia, unspecified: Secondary | ICD-10-CM | POA: Insufficient documentation

## 2022-03-26 NOTE — Assessment & Plan Note (Addendum)
On metformin, tresiba and jardiance.  Tolerating jardiance.  Discussed low carb diet and exercise.  Follow met b and a1c.a1c has improved slightly.  Will increase jardiance to 60m q day.  Instructed to stay hydrated.  Monitor of urinary or yeast infections.

## 2022-03-26 NOTE — Assessment & Plan Note (Signed)
hgb stable 11.4.  Colonoscopy 2017.  Recommended f/u in 5 years.  Overdue.

## 2022-03-26 NOTE — Assessment & Plan Note (Signed)
Continue lisinopril, hctz and metoprolol.  Blood pressure doing well.  Continue current medication.  Follow pressures.  

## 2022-03-26 NOTE — Assessment & Plan Note (Signed)
Taking omeprazole 20mg  q day.  No acid reflux reported.

## 2022-03-26 NOTE — Assessment & Plan Note (Signed)
On crestor.  Low cholesterol diet and exercise.  Follow lipid panel and liver function tests.   

## 2022-03-26 NOTE — Assessment & Plan Note (Signed)
On flecaininde, metoprolol and aspirin.  Appears to be in SR. Stable.   

## 2022-03-26 NOTE — Assessment & Plan Note (Signed)
CPAP.  

## 2022-04-04 ENCOUNTER — Encounter: Payer: Self-pay | Admitting: Ophthalmology

## 2022-04-05 NOTE — Discharge Instructions (Signed)

## 2022-04-10 ENCOUNTER — Encounter
Admission: RE | Disposition: A | Discharge status: Home / Self Care | Payer: Self-pay | Source: Home / Self Care | Attending: Ophthalmology

## 2022-04-10 ENCOUNTER — Ambulatory Visit (AMBULATORY_SURGERY_CENTER): Payer: Medicare Other | Admitting: Anesthesiology

## 2022-04-10 ENCOUNTER — Other Ambulatory Visit: Payer: Self-pay

## 2022-04-10 ENCOUNTER — Ambulatory Visit
Admission: RE | Admit: 2022-04-10 | Discharge: 2022-04-10 | Disposition: A | Discharge status: Home / Self Care | Payer: Medicare Other | Attending: Ophthalmology | Admitting: Ophthalmology

## 2022-04-10 ENCOUNTER — Encounter: Payer: Self-pay | Admitting: Ophthalmology

## 2022-04-10 ENCOUNTER — Ambulatory Visit: Payer: Medicare Other | Admitting: Anesthesiology

## 2022-04-10 DIAGNOSIS — D649 Anemia, unspecified: Secondary | ICD-10-CM

## 2022-04-10 DIAGNOSIS — H2511 Age-related nuclear cataract, right eye: Secondary | ICD-10-CM

## 2022-04-10 DIAGNOSIS — E119 Type 2 diabetes mellitus without complications: Secondary | ICD-10-CM

## 2022-04-10 DIAGNOSIS — I25119 Atherosclerotic heart disease of native coronary artery with unspecified angina pectoris: Secondary | ICD-10-CM | POA: Diagnosis not present

## 2022-04-10 DIAGNOSIS — E1136 Type 2 diabetes mellitus with diabetic cataract: Secondary | ICD-10-CM | POA: Diagnosis not present

## 2022-04-10 HISTORY — DX: Dizziness and giddiness: R42

## 2022-04-10 HISTORY — DX: Family history of other specified conditions: Z84.89

## 2022-04-10 HISTORY — PX: CATARACT EXTRACTION W/PHACO: SHX586

## 2022-04-10 HISTORY — DX: Presence of spectacles and contact lenses: Z97.3

## 2022-04-10 LAB — GLUCOSE, CAPILLARY
Glucose-Capillary: 160 mg/dL — ABNORMAL HIGH (ref 70–99)
Glucose-Capillary: 170 mg/dL — ABNORMAL HIGH (ref 70–99)

## 2022-04-10 SURGERY — PHACOEMULSIFICATION, CATARACT, WITH IOL INSERTION
Anesthesia: Monitor Anesthesia Care | Site: Eye | Laterality: Right

## 2022-04-10 MED ORDER — SIGHTPATH DOSE#1 BSS IO SOLN
INTRAOCULAR | Status: DC | PRN
  Administered 2022-04-10: 1 mL

## 2022-04-10 MED ORDER — SIGHTPATH DOSE#1 NA CHONDROIT SULF-NA HYALURON 40-17 MG/ML IO SOLN
INTRAOCULAR | Status: DC | PRN
  Administered 2022-04-10: 1 mL via INTRAOCULAR

## 2022-04-10 MED ORDER — SIGHTPATH DOSE#1 BSS IO SOLN
INTRAOCULAR | Status: DC | PRN
  Administered 2022-04-10: 15 mL

## 2022-04-10 MED ORDER — FENTANYL CITRATE (PF) 100 MCG/2ML IJ SOLN
INTRAMUSCULAR | Status: DC | PRN
  Administered 2022-04-10: 50 ug via INTRAVENOUS

## 2022-04-10 MED ORDER — ARMC OPHTHALMIC DILATING DROPS
1.0000 | OPHTHALMIC | Status: DC | PRN
  Administered 2022-04-10 (×3): 1 via OPHTHALMIC

## 2022-04-10 MED ORDER — BRIMONIDINE TARTRATE-TIMOLOL 0.2-0.5 % OP SOLN
OPHTHALMIC | Status: DC | PRN
  Administered 2022-04-10: 1 [drp] via OPHTHALMIC

## 2022-04-10 MED ORDER — MIDAZOLAM HCL 2 MG/2ML IJ SOLN
INTRAMUSCULAR | Status: DC | PRN
  Administered 2022-04-10: 1 mg via INTRAVENOUS

## 2022-04-10 MED ORDER — LACTATED RINGERS IV SOLN: INTRAVENOUS | Status: DC

## 2022-04-10 MED ORDER — MOXIFLOXACIN HCL 0.5 % OP SOLN
OPHTHALMIC | Status: DC | PRN
  Administered 2022-04-10: 0.2 mL via OPHTHALMIC

## 2022-04-10 MED ORDER — SIGHTPATH DOSE#1 BSS IO SOLN
INTRAOCULAR | Status: DC | PRN
  Administered 2022-04-10: 53 mL via OPHTHALMIC

## 2022-04-10 MED ORDER — TETRACAINE HCL 0.5 % OP SOLN
1.0000 [drp] | OPHTHALMIC | Status: DC | PRN
  Administered 2022-04-10 (×3): 1 [drp] via OPHTHALMIC

## 2022-04-10 SURGICAL SUPPLY — 17 items
CANNULA ANT/CHMB 27G (MISCELLANEOUS) IMPLANT
CANNULA ANT/CHMB 27GA (MISCELLANEOUS) IMPLANT
CATARACT SUITE SIGHTPATH (MISCELLANEOUS) ×2 IMPLANT
FEE CATARACT SUITE SIGHTPATH (MISCELLANEOUS) ×1 IMPLANT
GLOVE SURG ENC TEXT LTX SZ8 (GLOVE) ×2 IMPLANT
GLOVE SURG TRIUMPH 8.0 PF LTX (GLOVE) ×2 IMPLANT
LENS IOL ACRSF IQ VT 15 11.5 IMPLANT
LENS IOL VIVITY DFT 015 11.5 ×2 IMPLANT
NDL FILTER BLUNT 18X1 1/2 (NEEDLE) ×1 IMPLANT
NEEDLE FILTER BLUNT 18X 1/2SAF (NEEDLE) ×1
NEEDLE FILTER BLUNT 18X1 1/2 (NEEDLE) ×1 IMPLANT
PACK VIT ANT 23G (MISCELLANEOUS) IMPLANT
RING MALYGIN (MISCELLANEOUS) IMPLANT
SUT ETHILON 10-0 CS-B-6CS-B-6 (SUTURE)
SUTURE EHLN 10-0 CS-B-6CS-B-6 (SUTURE) IMPLANT
SYR 3ML LL SCALE MARK (SYRINGE) ×2 IMPLANT
WATER STERILE IRR 250ML POUR (IV SOLUTION) ×2 IMPLANT

## 2022-04-10 NOTE — Anesthesia Preprocedure Evaluation (Signed)
Anesthesia Evaluation  Patient identified by MRN, date of birth, ID band Patient awake    Reviewed: Allergy & Precautions, H&P , NPO status , Patient's Chart, lab work & pertinent test results, reviewed documented beta blocker date and time   Airway Mallampati: II  TM Distance: >3 FB Neck ROM: full    Dental no notable dental hx. (+) Teeth Intact   Pulmonary sleep apnea ,    Pulmonary exam normal breath sounds clear to auscultation       Cardiovascular Exercise Tolerance: Good hypertension, On Medications + angina + Valvular Problems/Murmurs  Rhythm:regular Rate:Normal     Neuro/Psych negative neurological ROS  negative psych ROS   GI/Hepatic Neg liver ROS, GERD  Medicated,  Endo/Other  negative endocrine ROSdiabetes  Renal/GU      Musculoskeletal   Abdominal   Peds  Hematology  (+) Blood dyscrasia, anemia ,   Anesthesia Other Findings   Reproductive/Obstetrics negative OB ROS                             Anesthesia Physical Anesthesia Plan  ASA: 3  Anesthesia Plan: MAC   Post-op Pain Management:    Induction:   PONV Risk Score and Plan:   Airway Management Planned:   Additional Equipment:   Intra-op Plan:   Post-operative Plan:   Informed Consent: I have reviewed the patients History and Physical, chart, labs and discussed the procedure including the risks, benefits and alternatives for the proposed anesthesia with the patient or authorized representative who has indicated his/her understanding and acceptance.       Plan Discussed with: CRNA  Anesthesia Plan Comments:         Anesthesia Quick Evaluation

## 2022-04-10 NOTE — Op Note (Signed)
PREOPERATIVE DIAGNOSIS:  Nuclear sclerotic cataract of the right eye.   POSTOPERATIVE DIAGNOSIS:   Cataract   OPERATIVE PROCEDURE:ORPROCALL@   SURGEON:  Galen Manila, MD.   ANESTHESIA:  Anesthesiologist: Yevette Edwards, MD CRNA: Karoline Caldwell, CRNA  1.      Managed anesthesia care. 2.      0.39ml of Shugarcaine was instilled in the eye following the paracentesis.   COMPLICATIONS:  None.   TECHNIQUE:   Stop and chop   DESCRIPTION OF PROCEDURE:  The patient was examined and consented in the preoperative holding area where the aforementioned topical anesthesia was applied to the right eye and then brought back to the Operating Room where the right eye was prepped and draped in the usual sterile ophthalmic fashion and a lid speculum was placed. A paracentesis was created with the side port blade and the anterior chamber was filled with viscoelastic. A near clear corneal incision was performed with the steel keratome. A continuous curvilinear capsulorrhexis was performed with a cystotome followed by the capsulorrhexis forceps. Hydrodissection and hydrodelineation were carried out with BSS on a blunt cannula. The lens was removed in a stop and chop  technique and the remaining cortical material was removed with the irrigation-aspiration handpiece. The capsular bag was inflated with viscoelastic and the Technis [id number]  lens was placed in the capsular bag without complication. The remaining viscoelastic was removed from the eye with the irrigation-aspiration handpiece. The wounds were hydrated. The anterior chamber was flushed with BSS and the eye was inflated to physiologic pressure. 0.14ml of Vigamox was placed in the anterior chamber. The wounds were found to be water tight. The eye was dressed with Combigan. The patient was given protective glasses to wear throughout the day and a shield with which to sleep tonight. The patient was also given drops with which to begin a drop regimen today and will  follow-up with me in one day. Implant Name Type Inv. Item Serial No. Manufacturer Lot No. LRB No. Used Action  LENS IOL VIVITY DFT 015 11.5 - [id number]  LENS IOL VIVITY DFT 015 11.5 [id number] [employer]  Right 1 Implanted   Procedure(s) with comments: CATARACT EXTRACTION PHACO AND INTRAOCULAR LENS PLACEMENT (IOC) RIGHT DIABETIC VIVITY LENS (Right) - 6.89 0:40.8  Electronically signed: Galen Manila 04/10/2022 10:27 AM

## 2022-04-10 NOTE — Transfer of Care (Signed)
Immediate Anesthesia Transfer of Care Note  Patient: Christine Robertson  Procedure(s) Performed: CATARACT EXTRACTION PHACO AND INTRAOCULAR LENS PLACEMENT (IOC) RIGHT DIABETIC VIVITY LENS (Right: Eye)  Patient Location: PACU  Anesthesia Type:MAC  Level of Consciousness: awake, alert  and oriented  Airway & Oxygen Therapy: Patient Spontanous Breathing  Post-op Assessment: Report given to RN and Post -op Vital signs reviewed and stable  Post vital signs: Reviewed and stable  Last Vitals:  Vitals Value Taken Time  BP 125/66 04/10/22 1028  Temp    Pulse 56 04/10/22 1029  Resp 16 04/10/22 1029  SpO2 98 % 04/10/22 1029  Vitals shown include unvalidated device data.  Last Pain:  Vitals:   04/10/22 0935  TempSrc: Temporal  PainSc: 0-No pain         Complications: No notable events documented.

## 2022-04-10 NOTE — H&P (Signed)
[facility]   Primary Care Physician:  Dale Mokena, MD Ophthalmologist: Dr. Druscilla Brownie  Pre-Procedure History & Physical: HPI:  Christine Robertson is a 66 y.o. female here for cataract surgery.   Past Medical History:  Diagnosis Date   Abnormal results of thyroid function studies    Allergy    hay fever   Angina pectoris, unspecified (HCC)    Arthritis    Atrial fibrillation, unspecified    Chicken pox    Cold hands    Congenital anomaly of adrenal gland    Diabetes mellitus    type 2, uncomplicated   Essential hypertension, benign    Family history of adverse reaction to anesthesia    Mother - PONV   GERD (gastroesophageal reflux disease)    IBS (irritable bowel syndrome)    Malaise    Morbid obesity (HCC)    Murmur    Pain in soft tissues of limb    Proteinuria    Pure hypercholesterolemia    Rash and other nonspecific skin eruption    Sleep apnea    Obstructive; Uses C-Pap machine   Urinary incontinence    Vertigo    1 episode, approx 2013   Wears contact lenses    left eye    Past Surgical History:  Procedure Laterality Date   ABDOMINAL HYSTERECTOMY  2010   BLADDER SURGERY  1967   stretching of bladder   BREAST BIOPSY Left 03/28/2015   done at Dr. R[facility]ford [facility] office. fibrocystic changes   COLONOSCOPY     COLONOSCOPY WITH PROPOFOL N/A 09/09/2015   Procedure: COLONOSCOPY WITH PROPOFOL;  Surgeon: Scot Jun, MD;  Location: [facility] ENDOSCOPY;  Service: Endoscopy;  Laterality: N/A;   ESOPHAGOGASTRODUODENOSCOPY     TONSILLECTOMY AND ADENOIDECTOMY  1959   URETHRAL DILATION      Prior to Admission medications   Medication Sig Start Date End Date Taking? Authorizing Provider  Ascorbic Acid (VITAMIN C) 1000 MG tablet Take 1,000 mg by mouth daily.   Yes [provider]  aspirin 81 MG tablet Take 81 mg by mouth daily.   Yes [provider]  cetirizine (ZYRTEC) 10 MG tablet Take 10 mg by mouth daily.   Yes [provider]   empagliflozin (JARDIANCE) 25 MG TABS tablet Take 1 tablet (25 mg total) by mouth daily before breakfast. 03/22/22  Yes Dale [place], MD  flecainide (TAMBOCOR) 100 MG tablet Take 100 mg by mouth 2 (two) times daily. 11/19/17  Yes [provider]  hydrochlorothiazide (HYDRODIURIL) 25 MG tablet TAKE ONE TABLET BY MOUTH DAILY 03/07/22  Yes Dale [place], MD  insulin degludec (TRESIBA FLEXTOUCH) 100 UNIT/ML FlexTouch Pen INJECT 10 UNITS SUB-Q DAILY AND TITRATE AS DIRECTED UP TO A MAXIMUM DOSE OF 30 UNITS 01/09/22  Yes Dale [place], MD  levalbuterol [facility] HFA) 45 MCG/ACT inhaler 1-2 puffs tid prn 12/14/21  Yes Dale [place], MD  lisinopril (ZESTRIL) 20 MG tablet TAKE ONE TABLET BY MOUTH DAILY 09/18/21  Yes Dale [place], MD  metFORMIN (GLUCOPHAGE-XR) 500 MG 24 hr tablet TAKE TWO TABLETS BY MOUTH TWICE A DAY 02/28/22  Yes Dale [place], MD  metoprolol tartrate (LOPRESSOR) 25 MG tablet Take 25 mg by mouth 2 (two) times daily.   Yes Dalia Heading, MD  Multiple Vitamin (MULTIVITAMIN) tablet Take 1 tablet by mouth daily.   Yes [provider]  omeprazole (PRILOSEC) 20 MG capsule TAKE 1 CAPSULE BY MOUTH TWICE DAILY Patient taking differently: Take 20 mg by mouth daily. 09/07/16  Yes Dale [place], MD  oxybutynin (DITROPAN) 5 MG tablet TAKE ONE TABLET BY MOUTH TWICE A DAY 10/10/21  Yes Dale [place], MD  Probiotic Product (PROBIOTIC DAILY PO) Take 1 capsule by mouth daily.   Yes [provider]  rosuvastatin (CRESTOR) 10 MG tablet TAKE ONE TABLET BY MOUTH DAILY 08/15/21  Yes Dale Montez[place], MD  gabapentin (NEURONTIN) 300 MG capsule Take 300 mg by mouth 2 (two) times daily. Patient not taking: Reported on 04/04/2022    [provider]  Insulin Pen Needle (PEN NEEDLES) 32G X 4 MM MISC Use to inject insulin daily 07/20/21   Dale [place], MD    Allergies as of 02/20/2022 - Review Complete 02/08/2022  Allergen Reaction Noted   Erythromycin Hives 06/06/2012    Augmentin [amoxicillin-pot clavulanate] Hives 06/06/2012   Demerol [meperidine] Nausea Only 06/06/2012    Family History  Problem Relation Age of Onset   Diabetes Mother    Cancer Father        lung   Breast cancer Maternal Grandmother 36   Breast cancer Maternal Aunt 91    Social History   Socioeconomic History   Marital status: Married    Spouse name: Not on file   Number of children: Not on file   Years of education: Not on file   Highest education level: Not on file  Occupational History   Not on file  Tobacco Use   Smoking status: Never   Smokeless tobacco: Never  Vaping Use   Vaping Use: Never used  Substance and Sexual Activity   Alcohol use: No    Alcohol/week: 0.0 standard drinks of alcohol   Drug use: No   Sexual activity: Not on file  Other Topics Concern   Not on file  Social History Narrative   Not on file   Social Determinants of Health   Financial Resource Strain: Medium Risk (10/30/2021)   Overall Financial Resource Strain (CARDIA)    Difficulty of Paying Living Expenses: Somewhat hard  Food Insecurity: No Food Insecurity (09/21/2021)   Hunger Vital Sign    Worried About Running Out of Food in the Last Year: Never true    Ran Out of Food in the Last Year: Never true  Transportation Needs: No Transportation Needs (09/21/2021)   PRAPARE - Administrator, Civil Service (Medical): No    Lack of Transportation (Non-Medical): No  Physical Activity: Unknown (04/12/2020)   Exercise Vital Sign    Days of Exercise per Week: Not on file    Minutes of Exercise per Session: 0 min  Stress: No Stress Concern Present (09/21/2021)   [employer] of Occupational Health - Occupational Stress Questionnaire    Feeling of Stress : Not at all  Social Connections: Unknown (09/21/2021)   Social Connection and Isolation Panel [NHANES]    Frequency of Communication with Friends and Family: Not on file    Frequency of Social Gatherings with Friends and  Family: Not on file    Attends Religious Services: Not on file    Active Member of Clubs or Organizations: Not on file    Attends Banker Meetings: Not on file    Marital Status: Married  Intimate Partner Violence: Not At Risk (09/21/2021)   Humiliation, Afraid, Rape, and Kick questionnaire    Fear of Current or Ex-Partner: No    Emotionally Abused: No    Physically Abused: No    Sexually Abused: No    Review of Systems: See HPI, otherwise  negative ROS  Physical Exam: BP (!) 166/58   Pulse (!) 55   Temp 97.8 F (36.6 C) (Temporal)   Ht 5' 3.5" (1.613 m)   Wt 131.1 kg   SpO2 98%   BMI 50.39 kg/m  General:   Alert, cooperative in NAD Head:  Normocephalic and atraumatic. Respiratory:  Normal work of breathing. Cardiovascular:  RRR  Impression/Plan: Christine Robertson is here for cataract surgery.  Risks, benefits, limitations, and alternatives regarding cataract surgery have been reviewed with the patient.  Questions have been answered.  All parties agreeable.   Galen Manila, MD  04/10/2022, 10:01 AM

## 2022-04-10 NOTE — Anesthesia Postprocedure Evaluation (Signed)
Anesthesia Post Note  Patient: Christine Robertson  Procedure(s) Performed: CATARACT EXTRACTION PHACO AND INTRAOCULAR LENS PLACEMENT (IOC) RIGHT DIABETIC VIVITY LENS (Right: Eye)  Patient location during evaluation: PACU Anesthesia Type: MAC Level of consciousness: awake and alert Pain management: pain level controlled Vital Signs Assessment: post-procedure vital signs reviewed and stable Respiratory status: spontaneous breathing, nonlabored ventilation, respiratory function stable and patient connected to nasal cannula oxygen Cardiovascular status: stable and blood pressure returned to baseline Postop Assessment: no apparent nausea or vomiting Anesthetic complications: no   No notable events documented.   Last Vitals:  Vitals:   04/10/22 0935 04/10/22 1028  BP: (!) 166/58 125/66  Pulse: (!) 55 (!) 57  Resp:  11  Temp: 36.6 C   SpO2: 98% 98%    Last Pain:  Vitals:   04/10/22 0935  TempSrc: Temporal  PainSc: 0-No pain                 Kartier Bennison Lorenza Chick

## 2022-04-11 ENCOUNTER — Encounter: Payer: Self-pay | Admitting: Ophthalmology

## 2022-04-11 LAB — HM DIABETES EYE EXAM

## 2022-04-14 ENCOUNTER — Other Ambulatory Visit: Payer: Self-pay | Admitting: Internal Medicine

## 2022-04-16 LAB — HM DIABETES EYE EXAM

## 2022-04-17 ENCOUNTER — Encounter: Payer: Self-pay | Admitting: Ophthalmology

## 2022-04-20 NOTE — Discharge Instructions (Signed)

## 2022-04-24 ENCOUNTER — Ambulatory Visit
Admission: RE | Admit: 2022-04-24 | Discharge: 2022-04-24 | Disposition: A | Discharge status: Home / Self Care | Payer: Medicare Other | Attending: Ophthalmology | Admitting: Ophthalmology

## 2022-04-24 ENCOUNTER — Encounter
Admission: RE | Disposition: A | Discharge status: Home / Self Care | Payer: Self-pay | Source: Home / Self Care | Attending: Ophthalmology

## 2022-04-24 ENCOUNTER — Other Ambulatory Visit: Payer: Self-pay

## 2022-04-24 ENCOUNTER — Ambulatory Visit (AMBULATORY_SURGERY_CENTER): Payer: Medicare Other | Admitting: Anesthesiology

## 2022-04-24 ENCOUNTER — Ambulatory Visit: Payer: Medicare Other | Admitting: Anesthesiology

## 2022-04-24 DIAGNOSIS — Z79899 Other long term (current) drug therapy: Secondary | ICD-10-CM | POA: Insufficient documentation

## 2022-04-24 DIAGNOSIS — H2512 Age-related nuclear cataract, left eye: Secondary | ICD-10-CM

## 2022-04-24 DIAGNOSIS — I10 Essential (primary) hypertension: Secondary | ICD-10-CM | POA: Insufficient documentation

## 2022-04-24 DIAGNOSIS — K219 Gastro-esophageal reflux disease without esophagitis: Secondary | ICD-10-CM | POA: Diagnosis not present

## 2022-04-24 DIAGNOSIS — G4730 Sleep apnea, unspecified: Secondary | ICD-10-CM | POA: Insufficient documentation

## 2022-04-24 DIAGNOSIS — Z794 Long term (current) use of insulin: Secondary | ICD-10-CM | POA: Insufficient documentation

## 2022-04-24 DIAGNOSIS — E119 Type 2 diabetes mellitus without complications: Secondary | ICD-10-CM | POA: Diagnosis not present

## 2022-04-24 DIAGNOSIS — Z833 Family history of diabetes mellitus: Secondary | ICD-10-CM | POA: Diagnosis not present

## 2022-04-24 DIAGNOSIS — E1136 Type 2 diabetes mellitus with diabetic cataract: Secondary | ICD-10-CM | POA: Insufficient documentation

## 2022-04-24 DIAGNOSIS — I209 Angina pectoris, unspecified: Secondary | ICD-10-CM

## 2022-04-24 DIAGNOSIS — Z7984 Long term (current) use of oral hypoglycemic drugs: Secondary | ICD-10-CM | POA: Insufficient documentation

## 2022-04-24 HISTORY — PX: CATARACT EXTRACTION W/PHACO: SHX586

## 2022-04-24 LAB — GLUCOSE, CAPILLARY
Glucose-Capillary: 168 mg/dL — ABNORMAL HIGH (ref 70–99)
Glucose-Capillary: 170 mg/dL — ABNORMAL HIGH (ref 70–99)

## 2022-04-24 SURGERY — PHACOEMULSIFICATION, CATARACT, WITH IOL INSERTION
Anesthesia: Monitor Anesthesia Care | Site: Eye | Laterality: Left

## 2022-04-24 MED ORDER — MOXIFLOXACIN HCL 0.5 % OP SOLN
OPHTHALMIC | Status: DC | PRN
  Administered 2022-04-24: 0.2 mL via OPHTHALMIC

## 2022-04-24 MED ORDER — MIDAZOLAM HCL 2 MG/2ML IJ SOLN
INTRAMUSCULAR | Status: DC | PRN
  Administered 2022-04-24: 1 mg via INTRAVENOUS

## 2022-04-24 MED ORDER — LACTATED RINGERS IV SOLN: INTRAVENOUS | Status: DC

## 2022-04-24 MED ORDER — SIGHTPATH DOSE#1 NA CHONDROIT SULF-NA HYALURON 40-17 MG/ML IO SOLN
INTRAOCULAR | Status: DC | PRN
  Administered 2022-04-24: 1 mL via INTRAOCULAR

## 2022-04-24 MED ORDER — SIGHTPATH DOSE#1 BSS IO SOLN
INTRAOCULAR | Status: DC | PRN
  Administered 2022-04-24: 2 mL

## 2022-04-24 MED ORDER — FENTANYL CITRATE (PF) 100 MCG/2ML IJ SOLN
INTRAMUSCULAR | Status: DC | PRN
Start: 2022-04-24 — End: 2022-04-24
  Administered 2022-04-24: 50 ug via INTRAVENOUS

## 2022-04-24 MED ORDER — BRIMONIDINE TARTRATE-TIMOLOL 0.2-0.5 % OP SOLN
OPHTHALMIC | Status: DC | PRN
  Administered 2022-04-24: 1 [drp] via OPHTHALMIC

## 2022-04-24 MED ORDER — TETRACAINE HCL 0.5 % OP SOLN
1.0000 [drp] | OPHTHALMIC | Status: DC | PRN
  Administered 2022-04-24 (×3): 1 [drp] via OPHTHALMIC

## 2022-04-24 MED ORDER — SIGHTPATH DOSE#1 BSS IO SOLN
INTRAOCULAR | Status: DC | PRN
  Administered 2022-04-24: 15 mL via INTRAOCULAR

## 2022-04-24 MED ORDER — ARMC OPHTHALMIC DILATING DROPS
1.0000 | OPHTHALMIC | Status: DC | PRN
  Administered 2022-04-24 (×3): 1 via OPHTHALMIC

## 2022-04-24 MED ORDER — SIGHTPATH DOSE#1 BSS IO SOLN
INTRAOCULAR | Status: DC | PRN
  Administered 2022-04-24: 50 mL via OPHTHALMIC

## 2022-04-24 SURGICAL SUPPLY — 13 items
CANNULA ANT/CHMB 27G (MISCELLANEOUS) IMPLANT
CANNULA ANT/CHMB 27GA (MISCELLANEOUS) IMPLANT
CATARACT SUITE SIGHTPATH (MISCELLANEOUS) ×1 IMPLANT
FEE CATARACT SUITE SIGHTPATH (MISCELLANEOUS) ×1 IMPLANT
GLOVE SURG ENC TEXT LTX SZ8 (GLOVE) ×1 IMPLANT
GLOVE SURG TRIUMPH 8.0 PF LTX (GLOVE) ×1 IMPLANT
LENS IOL ACRSF VT TRC 315 10.0 IMPLANT
LENS IOL VIVITY DFT 315 10.0 ×1 IMPLANT
NDL FILTER BLUNT 18X1 1/2 (NEEDLE) ×1 IMPLANT
NEEDLE FILTER BLUNT 18X 1/2SAF (NEEDLE) ×1
NEEDLE FILTER BLUNT 18X1 1/2 (NEEDLE) ×1 IMPLANT
SYR 3ML LL SCALE MARK (SYRINGE) ×1 IMPLANT
WATER STERILE IRR 250ML POUR (IV SOLUTION) ×1 IMPLANT

## 2022-04-24 NOTE — Anesthesia Preprocedure Evaluation (Signed)
Anesthesia Evaluation  Patient identified by MRN, date of birth, ID band Patient awake    Reviewed: Allergy & Precautions, H&P , NPO status , Patient's Chart, lab work & pertinent test results, reviewed documented beta blocker date and time   History of Anesthesia Complications Negative for: history of anesthetic complications  Airway Mallampati: IV  TM Distance: >3 FB Neck ROM: full    Dental no notable dental hx. (+) Teeth Intact   Pulmonary neg shortness of breath, asthma , sleep apnea ,    Pulmonary exam normal breath sounds clear to auscultation       Cardiovascular Exercise Tolerance: Good hypertension, On Medications + angina + Valvular Problems/Murmurs  Rhythm:regular Rate:Normal     Neuro/Psych negative neurological ROS  negative psych ROS   GI/Hepatic Neg liver ROS, GERD  Medicated,  Endo/Other  negative endocrine ROSdiabetes  Renal/GU      Musculoskeletal   Abdominal   Peds  Hematology  (+) Blood dyscrasia, anemia ,   Anesthesia Other Findings   Reproductive/Obstetrics negative OB ROS                             Anesthesia Physical  Anesthesia Plan  ASA: 3  Anesthesia Plan: MAC   Post-op Pain Management:    Induction: Intravenous  PONV Risk Score and Plan: Midazolam  Airway Management Planned: Natural Airway and Nasal Cannula  Additional Equipment:   Intra-op Plan:   Post-operative Plan:   Informed Consent: I have reviewed the patients History and Physical, chart, labs and discussed the procedure including the risks, benefits and alternatives for the proposed anesthesia with the patient or authorized representative who has indicated his/her understanding and acceptance.     Dental Advisory Given  Plan Discussed with: CRNA  Anesthesia Plan Comments: (Patient consented for risks of anesthesia including but not limited to:  - adverse reactions to  medications - damage to eyes, teeth, lips or other oral mucosa - nerve damage due to positioning  - sore throat or hoarseness - Damage to heart, brain, nerves, lungs, other parts of body or loss of life  Patient voiced understanding.)        Anesthesia Quick Evaluation

## 2022-04-24 NOTE — H&P (Signed)
[facility]   Primary Care Physician:  Dale Fairland, MD Ophthalmologist: Dr. Druscilla Brownie  Pre-Procedure History & Physical: HPI:  Christine Robertson is a 66 y.o. female here for cataract surgery.   Past Medical History:  Diagnosis Date   Abnormal results of thyroid function studies    Allergy    hay fever   Angina pectoris, unspecified (HCC)    Arthritis    Atrial fibrillation, unspecified    Chicken pox    Cold hands    Congenital anomaly of adrenal gland    Diabetes mellitus    type 2, uncomplicated   Essential hypertension, benign    Family history of adverse reaction to anesthesia    Mother - PONV   GERD (gastroesophageal reflux disease)    IBS (irritable bowel syndrome)    Malaise    Morbid obesity (HCC)    Murmur    Pain in soft tissues of limb    Proteinuria    Pure hypercholesterolemia    Rash and other nonspecific skin eruption    Sleep apnea    Obstructive; Uses C-Pap machine   Urinary incontinence    Vertigo    1 episode, approx 2013   Wears contact lenses    left eye    Past Surgical History:  Procedure Laterality Date   ABDOMINAL HYSTERECTOMY  2010   BLADDER SURGERY  1967   stretching of bladder   BREAST BIOPSY Left 03/28/2015   done at Dr. Rutherford [facility] office. fibrocystic changes   CATARACT EXTRACTION W/PHACO Right 04/10/2022   Procedure: CATARACT EXTRACTION PHACO AND INTRAOCULAR LENS PLACEMENT (IOC) RIGHT DIABETIC VIVITY LENS;  Surgeon: Galen Manila, MD;  Location: [facility];  Service: Ophthalmology;  Laterality: Right;  6.89 0:40.8   COLONOSCOPY     COLONOSCOPY WITH PROPOFOL N/A 09/09/2015   Procedure: COLONOSCOPY WITH PROPOFOL;  Surgeon: Scot Jun, MD;  Location: [facility] ENDOSCOPY;  Service: Endoscopy;  Laterality: N/A;   ESOPHAGOGASTRODUODENOSCOPY     TONSILLECTOMY AND ADENOIDECTOMY  1959   URETHRAL DILATION      Prior to Admission medications   Medication Sig Start Date End Date Taking? Authorizing Provider  Ascorbic  Acid (VITAMIN C) 1000 MG tablet Take 1,000 mg by mouth daily.   Yes [provider]  aspirin 81 MG tablet Take 81 mg by mouth daily.   Yes [provider]  cetirizine (ZYRTEC) 10 MG tablet Take 10 mg by mouth daily.   Yes [provider]  empagliflozin (JARDIANCE) 25 MG TABS tablet Take 1 tablet (25 mg total) by mouth daily before breakfast. 03/22/22  Yes Dale [place], MD  flecainide (TAMBOCOR) 100 MG tablet Take 100 mg by mouth 2 (two) times daily. 11/19/17  Yes [provider]  gabapentin (NEURONTIN) 300 MG capsule Take 300 mg by mouth 2 (two) times daily.   Yes [provider]  hydrochlorothiazide (HYDRODIURIL) 25 MG tablet TAKE ONE TABLET BY MOUTH DAILY 03/07/22  Yes Dale [place], MD  insulin degludec (TRESIBA FLEXTOUCH) 100 UNIT/ML FlexTouch Pen INJECT 10 UNITS SUB-Q DAILY AND TITRATE AS DIRECTED UP TO A MAXIMUM DOSE OF 30 UNITS 01/09/22  Yes Dale [place], MD  Insulin Pen Needle (PEN NEEDLES) 32G X 4 MM MISC Use to inject insulin daily 07/20/21  Yes Dale [place], MD  levalbuterol [facility] HFA) 45 MCG/ACT inhaler 1-2 puffs tid prn 12/14/21  Yes Dale [place], MD  lisinopril (ZESTRIL) 20 MG tablet TAKE ONE TABLET BY MOUTH DAILY 04/15/22  Yes Eulis Foster, FNP  metFORMIN (GLUCOPHAGE-XR) 500 MG 24 hr tablet TAKE TWO TABLETS BY MOUTH TWICE A DAY 02/28/22  Yes Dale [place], MD  metoprolol tartrate (LOPRESSOR) 25 MG tablet Take 25 mg by mouth 2 (two) times daily.   Yes Dalia Heading, MD  Multiple Vitamin (MULTIVITAMIN) tablet Take 1 tablet by mouth daily.   Yes [provider]  omeprazole (PRILOSEC) 20 MG capsule TAKE 1 CAPSULE BY MOUTH TWICE DAILY Patient taking differently: Take 20 mg by mouth daily. 09/07/16  Yes Dale [place], MD  oxybutynin (DITROPAN) 5 MG tablet TAKE ONE TABLET BY MOUTH TWICE A DAY 10/10/21  Yes Dale [place], MD  Probiotic Product (PROBIOTIC DAILY PO) Take 1 capsule by mouth daily.   Yes [provider]   rosuvastatin (CRESTOR) 10 MG tablet TAKE ONE TABLET BY MOUTH DAILY 08/15/21  Yes Dale [place], MD    Allergies as of 02/20/2022 - Review Complete 02/08/2022  Allergen Reaction Noted   Erythromycin Hives 06/06/2012   Augmentin [amoxicillin-pot clavulanate] Hives 06/06/2012   Demerol [meperidine] Nausea Only 06/06/2012    Family History  Problem Relation Age of Onset   Diabetes Mother    Cancer Father        lung   Breast cancer Maternal Grandmother 23   Breast cancer Maternal Aunt 42    Social History   Socioeconomic History   Marital status: Married    Spouse name: Not on file   Number of children: Not on file   Years of education: Not on file   Highest education level: Not on file  Occupational History   Not on file  Tobacco Use   Smoking status: Never   Smokeless tobacco: Never  Vaping Use   Vaping Use: Never used  Substance and Sexual Activity   Alcohol use: No    Alcohol/week: 0.0 standard drinks of alcohol   Drug use: No   Sexual activity: Not on file  Other Topics Concern   Not on file  Social History Narrative   Not on file   Social Determinants of Health   Financial Resource Strain: Medium Risk (10/30/2021)   Overall Financial Resource Strain (CARDIA)    Difficulty of Paying Living Expenses: Somewhat hard  Food Insecurity: No Food Insecurity (09/21/2021)   Hunger Vital Sign    Worried About Running Out of Food in the Last Year: Never true    Ran Out of Food in the Last Year: Never true  Transportation Needs: No Transportation Needs (09/21/2021)   PRAPARE - Administrator, Civil Service (Medical): No    Lack of Transportation (Non-Medical): No  Physical Activity: Unknown (04/12/2020)   Exercise Vital Sign    Days of Exercise per Week: Not on file    Minutes of Exercise per Session: 0 min  Stress: No Stress Concern Present (09/21/2021)   [employer] of Occupational Health - Occupational Stress Questionnaire    Feeling of Stress :  Not at all  Social Connections: Unknown (09/21/2021)   Social Connection and Isolation Panel [NHANES]    Frequency of Communication with Friends and Family: Not on file    Frequency of Social Gatherings with Friends and Family: Not on file    Attends Religious Services: Not on file    Active Member of Clubs or Organizations: Not on file    Attends Banker Meetings: Not on file    Marital Status: Married  Intimate Partner Violence: Not At Risk (09/21/2021)   Humiliation, Afraid, Rape, and Kick questionnaire  Fear of Current or Ex-Partner: No    Emotionally Abused: No    Physically Abused: No    Sexually Abused: No    Review of Systems: See HPI, otherwise negative ROS  Physical Exam: Pulse (!) 56   Temp 97.9 F (36.6 C) (Temporal)   Resp 16   Ht 5' 3.5" (1.613 m)   Wt 127.9 kg   SpO2 96%   BMI 49.17 kg/m  General:   Alert, cooperative in NAD Head:  Normocephalic and atraumatic. Respiratory:  Normal work of breathing. Cardiovascular:  RRR  Impression/Plan: Christine Robertson is here for cataract surgery.  Risks, benefits, limitations, and alternatives regarding cataract surgery have been reviewed with the patient.  Questions have been answered.  All parties agreeable.   Birder Robson, MD  04/24/2022, 9:41 AM

## 2022-04-24 NOTE — Op Note (Signed)
PREOPERATIVE DIAGNOSIS:  Nuclear sclerotic cataract of the left eye.   POSTOPERATIVE DIAGNOSIS:  Nuclear sclerotic cataract of the left eye.   OPERATIVE PROCEDURE: Procedure(s): CATARACT EXTRACTION PHACO AND INTRAOCULAR LENS PLACEMENT (IOC) LEFT DIABETIC VIVITY  toric LENS 5.29 00:41.8   SURGEON:  Galen Manila, MD.   ANESTHESIA: 1.      Managed anesthesia care. 2.     0.51ml os Shugarcaine was instilled following the paracentesis [email or web address]   COMPLICATIONS:  None.   TECHNIQUE:   Stop and chop    DESCRIPTION OF PROCEDURE:  The patient was examined and consented in the preoperative holding area where the aforementioned topical anesthesia was applied to the left eye.  The patient was brought back to the Operating Room where he was sat upright on the gurney and given a target to fixate upon while the eye was marked at the 3:00 and 9:00 position.  The patient was then reclined on the operating table.  The eye was prepped and draped in the usual sterile ophthalmic fashion and a lid speculum was placed. A paracentesis was created with the side port blade and the anterior chamber was filled with viscoelastic. A near clear corneal incision was performed with the steel keratome. A continuous curvilinear capsulorrhexis was performed with a cystotome followed by the capsulorrhexis forceps. Hydrodissection and hydrodelineation were carried out with BSS on a blunt cannula. The lens was removed in a stop and chop technique and the remaining cortical material was removed with the irrigation-aspiration handpiece. The eye was inflated with viscoelastic and the [id number] lens was placed in the eye and rotated to within a few degrees of the predetermined orientation.  The remaining viscoelastic was removed from the eye.  The Sinskey hook was used to rotate the toric lens into its final resting place at 173 degrees.  0.1 ml of Vigamox was placed in the anterior chamber. The eye was inflated to a physiologic pressure  and found to be watertight.  The eye was dressed with Vigamox and [place] The patient was given protective glasses to wear throughout the day and a shield with which to sleep tonight. The patient was also given drops with which to begin a drop regimen today and will follow-up with me in one day. Implant Name Type Inv. Item Serial No. Manufacturer Lot No. LRB No. Used Action  LENS IOL VIVITY DFT 315 10.0 - [id number]  LENS IOL VIVITY DFT 315 10.0 [id number] [employer]  Left 1 Implanted   Procedure(s) with comments: CATARACT EXTRACTION PHACO AND INTRAOCULAR LENS PLACEMENT (IOC) LEFT DIABETIC VIVITY  toric LENS 5.29 00:41.8 (Left) - Diabetic  Electronically signed: Galen Manila 8/22/202310:13 AM

## 2022-04-24 NOTE — Transfer of Care (Signed)
Immediate Anesthesia Transfer of Care Note  Patient: Christine Robertson  Procedure(s) Performed: CATARACT EXTRACTION PHACO AND INTRAOCULAR LENS PLACEMENT (IOC) LEFT DIABETIC VIVITY  toric LENS 5.29 00:41.8 (Left: Eye)  Patient Location: PACU  Anesthesia Type: MAC  Level of Consciousness: awake, alert  and patient cooperative  Airway and Oxygen Therapy: Patient Spontanous Breathing   Post-op Assessment: Post-op Vital signs reviewed, Patient's Cardiovascular Status Stable, Respiratory Function Stable, Patent Airway and No signs of Nausea or vomiting  Post-op Vital Signs: Reviewed and stable  Complications: No notable events documented.

## 2022-04-24 NOTE — Anesthesia Postprocedure Evaluation (Signed)
Anesthesia Post Note  Patient: Christine Robertson  Procedure(s) Performed: CATARACT EXTRACTION PHACO AND INTRAOCULAR LENS PLACEMENT (IOC) LEFT DIABETIC VIVITY  toric LENS 5.29 00:41.8 (Left: Eye)     Patient location during evaluation: PACU Anesthesia Type: MAC Level of consciousness: awake and alert Pain management: pain level controlled Vital Signs Assessment: post-procedure vital signs reviewed and stable Respiratory status: spontaneous breathing, nonlabored ventilation, respiratory function stable and patient connected to nasal cannula oxygen Cardiovascular status: stable and blood pressure returned to baseline Postop Assessment: no apparent nausea or vomiting Anesthetic complications: no   No notable events documented.  Dimas Millin

## 2022-04-25 ENCOUNTER — Encounter: Payer: Self-pay | Admitting: Ophthalmology

## 2022-04-25 LAB — HM DIABETES EYE EXAM

## 2022-05-12 ENCOUNTER — Other Ambulatory Visit: Payer: Self-pay | Admitting: Internal Medicine

## 2022-05-21 LAB — HM DIABETES EYE EXAM

## 2022-06-18 ENCOUNTER — Other Ambulatory Visit (INDEPENDENT_AMBULATORY_CARE_PROVIDER_SITE_OTHER): Payer: Medicare Other

## 2022-06-18 DIAGNOSIS — E7800 Pure hypercholesterolemia, unspecified: Secondary | ICD-10-CM | POA: Diagnosis not present

## 2022-06-18 DIAGNOSIS — I10 Essential (primary) hypertension: Secondary | ICD-10-CM | POA: Diagnosis not present

## 2022-06-18 DIAGNOSIS — E1165 Type 2 diabetes mellitus with hyperglycemia: Secondary | ICD-10-CM | POA: Diagnosis not present

## 2022-06-18 LAB — LIPID PANEL
Cholesterol: 140 mg/dL (ref 0–200)
HDL: 53.4 mg/dL (ref >39.00)
LDL Cholesterol: 48 mg/dL (ref 0–99)
NonHDL: 86.32
Total CHOL/HDL Ratio: 3
Triglycerides: 194 mg/dL — ABNORMAL HIGH (ref 0.0–149.0)
VLDL: 38.8 mg/dL (ref 0.0–40.0)

## 2022-06-18 LAB — HEPATIC FUNCTION PANEL
ALT: 22 U/L (ref 0–35)
AST: 16 U/L (ref 0–37)
Albumin: 4.2 g/dL (ref 3.5–5.2)
Alkaline Phosphatase: 57 U/L (ref 39–117)
Bilirubin, Direct: 0.1 mg/dL (ref 0.0–0.3)
Total Bilirubin: 0.5 mg/dL (ref 0.2–1.2)
Total Protein: 6.9 g/dL (ref 6.0–8.3)

## 2022-06-18 LAB — BASIC METABOLIC PANEL
BUN: 28 mg/dL — ABNORMAL HIGH (ref 6–23)
CO2: 26 mEq/L (ref 19–32)
Calcium: 9.9 mg/dL (ref 8.4–10.5)
Chloride: 101 mEq/L (ref 96–112)
Creatinine, Ser: 0.87 mg/dL (ref 0.40–1.20)
GFR: 68.57 mL/min (ref >60.00)
Glucose, Bld: 174 mg/dL — ABNORMAL HIGH (ref 70–99)
Potassium: 4.3 mEq/L (ref 3.5–5.1)
Sodium: 139 mEq/L (ref 135–145)

## 2022-06-18 LAB — HEMOGLOBIN A1C: Hgb A1c MFr Bld: 8.3 % — ABNORMAL HIGH (ref 4.6–6.5)

## 2022-06-22 ENCOUNTER — Ambulatory Visit (INDEPENDENT_AMBULATORY_CARE_PROVIDER_SITE_OTHER): Payer: Medicare Other | Admitting: Internal Medicine

## 2022-06-22 ENCOUNTER — Encounter: Payer: Self-pay | Admitting: Internal Medicine

## 2022-06-22 VITALS — BP 124/62 | HR 63 | Temp 97.8°F | Ht 63.5 in | Wt 287.8 lb

## 2022-06-22 DIAGNOSIS — E1165 Type 2 diabetes mellitus with hyperglycemia: Secondary | ICD-10-CM

## 2022-06-22 DIAGNOSIS — I4891 Unspecified atrial fibrillation: Secondary | ICD-10-CM

## 2022-06-22 DIAGNOSIS — G4733 Obstructive sleep apnea (adult) (pediatric): Secondary | ICD-10-CM

## 2022-06-22 DIAGNOSIS — I10 Essential (primary) hypertension: Secondary | ICD-10-CM | POA: Diagnosis not present

## 2022-06-22 DIAGNOSIS — I872 Venous insufficiency (chronic) (peripheral): Secondary | ICD-10-CM

## 2022-06-22 DIAGNOSIS — Z23 Encounter for immunization: Secondary | ICD-10-CM

## 2022-06-22 DIAGNOSIS — E7800 Pure hypercholesterolemia, unspecified: Secondary | ICD-10-CM

## 2022-06-22 DIAGNOSIS — K219 Gastro-esophageal reflux disease without esophagitis: Secondary | ICD-10-CM

## 2022-06-22 DIAGNOSIS — Z1231 Encounter for screening mammogram for malignant neoplasm of breast: Secondary | ICD-10-CM

## 2022-06-22 DIAGNOSIS — Z0000 Encounter for general adult medical examination without abnormal findings: Secondary | ICD-10-CM

## 2022-06-22 DIAGNOSIS — D649 Anemia, unspecified: Secondary | ICD-10-CM

## 2022-06-22 NOTE — Assessment & Plan Note (Signed)
Physical today 06/21/22.  Colonoscopy 09/2015 - recommended f/u in 5 years.  Overdue.  Mammogram 06/05/21 - Birads I.

## 2022-06-22 NOTE — Progress Notes (Signed)
Patient ID: Christine Robertson, female   DOB: 11/22/1955, 66 y.o.   MRN: [id number]   Subjective:    Patient ID: Christine Robertson, female    DOB: 23-Dec-1955, 66 y.o.   MRN: [id number]   HPI With past history of afib, diabetes, hypercholesterolemia and hypertension.  She comes in today to follow up on these issues as well as for a complete physical exam.  On flecainide, metoprolol and aspirin.  Sugar elevated - last a1c elevated.  Increased jardaince to $RemoveBefo'25mg'TvLdvokMlWA$  q day last visit. On metformin and tresiba.  Intolerance to oxempic.  Discussed diet and exercise.  Discussed other treatment options and adjustment in insulin.  Discussed referral back to [facility] and CGM monitoring.  Blood sugars in am 129-169.  No chest pain.  Breathing stable.  No cough or congestion.  No abdominal pain.   Overdue colonoscopy.     Past Medical History:  Diagnosis Date   Abnormal results of thyroid function studies    Allergy    hay fever   Angina pectoris, unspecified (HCC)    Arthritis    Atrial fibrillation, unspecified    Chicken pox    Cold hands    Congenital anomaly of adrenal gland    Diabetes mellitus    type 2, uncomplicated   Essential hypertension, benign    Family history of adverse reaction to anesthesia    Mother - PONV   GERD (gastroesophageal reflux disease)    IBS (irritable bowel syndrome)    Malaise    Morbid obesity (HCC)    Murmur    Pain in soft tissues of limb    Proteinuria    Pure hypercholesterolemia    Rash and other nonspecific skin eruption    Sleep apnea    Obstructive; Uses C-Pap machine   Urinary incontinence    Vertigo    1 episode, approx 2013   Wears contact lenses    left eye   Past Surgical History:  Procedure Laterality Date   ABDOMINAL HYSTERECTOMY  2010   BLADDER SURGERY  1967   stretching of bladder   BREAST BIOPSY Left 03/28/2015   done at Dr. Dwyane Luo office. fibrocystic changes   CATARACT EXTRACTION W/PHACO Right 04/10/2022   Procedure: CATARACT EXTRACTION  PHACO AND INTRAOCULAR LENS PLACEMENT ([place]) RIGHT DIABETIC VIVITY LENS;  Surgeon: Birder Robson, MD;  Location: [place];  Service: Ophthalmology;  Laterality: Right;  6.89 0:40.8   CATARACT EXTRACTION W/PHACO Left 04/24/2022   Procedure: CATARACT EXTRACTION PHACO AND INTRAOCULAR LENS PLACEMENT ([facility]) LEFT DIABETIC VIVITY  toric LENS 5.29 00:41.8;  Surgeon: Birder Robson, MD;  Location: [place];  Service: Ophthalmology;  Laterality: Left;  Diabetic   COLONOSCOPY     COLONOSCOPY WITH PROPOFOL N/A 09/09/2015   Procedure: COLONOSCOPY WITH PROPOFOL;  Surgeon: Manya Silvas, MD;  Location: [facility] ENDOSCOPY;  Service: Endoscopy;  Laterality: N/A;   ESOPHAGOGASTRODUODENOSCOPY     TONSILLECTOMY AND ADENOIDECTOMY  1959   URETHRAL DILATION     Family History  Problem Relation Age of Onset   Diabetes Mother    Cancer Father        lung   Breast cancer Maternal Grandmother 78   Breast cancer Maternal Aunt 65   Social History   Socioeconomic History   Marital status: Married    Spouse name: Not on file   Number of children: Not on file   Years of education: Not on file   Highest education level: Not on file  Occupational  History   Not on file  Tobacco Use   Smoking status: Never   Smokeless tobacco: Never  Vaping Use   Vaping Use: Never used  Substance and Sexual Activity   Alcohol use: No    Alcohol/week: 0.0 standard drinks of alcohol   Drug use: No   Sexual activity: Not on file  Other Topics Concern   Not on file  Social History Narrative   Not on file   Social Determinants of Health   Financial Resource Strain: Medium Risk (10/30/2021)   Overall Financial Resource Strain (CARDIA)    Difficulty of Paying Living Expenses: Somewhat hard  Food Insecurity: No Food Insecurity (09/21/2021)   Hunger Vital Sign    Worried About Running Out of Food in the Last Year: Never true    Ran Out of Food in the Last Year: Never true  Transportation Needs: No  Transportation Needs (09/21/2021)   PRAPARE - Hydrologist (Medical): No    Lack of Transportation (Non-Medical): No  Physical Activity: Unknown (04/12/2020)   Exercise Vital Sign    Days of Exercise per Week: Not on file    Minutes of Exercise per Session: 0 min  Stress: No Stress Concern Present (09/21/2021)   [place]    Feeling of Stress : Not at all  Social Connections: Unknown (09/21/2021)   Social Connection and Isolation Panel [NHANES]    Frequency of Communication with Friends and Family: Not on file    Frequency of Social Gatherings with Friends and Family: Not on file    Attends Religious Services: Not on file    Active Member of Clubs or Organizations: Not on file    Attends Archivist Meetings: Not on file    Marital Status: Married     Review of Systems  Constitutional:  Negative for appetite change and unexpected weight change.  HENT:  Negative for congestion, sinus pressure and sore throat.   Eyes:  Negative for pain and visual disturbance.  Respiratory:  Negative for cough and chest tightness.        Breathing stable.   Cardiovascular:  Negative for chest pain and palpitations.       Chronic leg swelling - no change/no increase  Gastrointestinal:  Negative for abdominal pain, diarrhea, nausea and vomiting.  Genitourinary:  Negative for difficulty urinating and dysuria.  Musculoskeletal:  Negative for joint swelling and myalgias.  Skin:  Negative for color change and rash.  Neurological:  Negative for dizziness and headaches.  Hematological:  Negative for adenopathy. Does not bruise/bleed easily.  Psychiatric/Behavioral:  Negative for agitation and dysphoric mood.        Objective:     BP 124/62 (BP Location: Left Arm, Patient Position: Sitting, Cuff Size: Large) Comment (Cuff Size): bariatric cuff  Pulse 63   Temp 97.8 F (36.6 C) (Oral)   Ht 5' 3.5"  (1.613 m)   Wt 287 lb 12.8 oz (130.5 kg)   SpO2 98%   BMI 50.18 kg/m  Wt Readings from Last 3 Encounters:  06/22/22 287 lb 12.8 oz (130.5 kg)  04/24/22 282 lb (127.9 kg)  04/10/22 289 lb (131.1 kg)    Physical Exam Vitals reviewed.  Constitutional:      General: She is not in acute distress.    Appearance: Normal appearance. She is well-developed.  HENT:     Head: Normocephalic and atraumatic.     Right Ear: External ear  normal.     Left Ear: External ear normal.  Eyes:     General: No scleral icterus.       Right eye: No discharge.        Left eye: No discharge.     Conjunctiva/sclera: Conjunctivae normal.  Neck:     Thyroid: No thyromegaly.  Cardiovascular:     Rate and Rhythm: Normal rate and regular rhythm.  Pulmonary:     Effort: No tachypnea, accessory muscle usage or respiratory distress.     Breath sounds: Normal breath sounds. No decreased breath sounds or wheezing.  Chest:  Breasts:    Right: No inverted nipple, mass, nipple discharge or tenderness (no axillary adenopathy).     Left: No inverted nipple, mass, nipple discharge or tenderness (no axilarry adenopathy).  Abdominal:     General: Bowel sounds are normal.     Palpations: Abdomen is soft.     Tenderness: There is no abdominal tenderness.  Musculoskeletal:        General: No tenderness.     Cervical back: Neck supple. No tenderness.     Comments: Chronic venostasis changes present.   Lymphadenopathy:     Cervical: No cervical adenopathy.  Skin:    Findings: No erythema or rash.  Neurological:     Mental Status: She is alert and oriented to person, place, and time.  Psychiatric:        Mood and Affect: Mood normal.        Behavior: Behavior normal.      Outpatient Encounter Medications as of 06/22/2022  Medication Sig   Ascorbic Acid (VITAMIN C) 1000 MG tablet Take 1,000 mg by mouth daily.   aspirin 81 MG tablet Take 81 mg by mouth daily.   cetirizine (ZYRTEC) 10 MG tablet Take 10 mg by  mouth daily.   empagliflozin (JARDIANCE) 25 MG TABS tablet Take 1 tablet (25 mg total) by mouth daily before breakfast.   flecainide (TAMBOCOR) 100 MG tablet Take 100 mg by mouth 2 (two) times daily.   hydrochlorothiazide (HYDRODIURIL) 25 MG tablet TAKE ONE TABLET BY MOUTH DAILY   insulin degludec (TRESIBA FLEXTOUCH) 100 UNIT/ML FlexTouch Pen INJECT 10 UNITS SUB-Q DAILY AND TITRATE AS DIRECTED UP TO A MAXIMUM DOSE OF 30 UNITS   Insulin Pen Needle (PEN NEEDLES) 32G X 4 MM MISC Use to inject insulin daily   levalbuterol (XOPENEX HFA) 45 MCG/ACT inhaler 1-2 puffs tid prn   lisinopril (ZESTRIL) 20 MG tablet TAKE ONE TABLET BY MOUTH DAILY   metFORMIN (GLUCOPHAGE-XR) 500 MG 24 hr tablet TAKE TWO TABLETS BY MOUTH TWICE A DAY   metoprolol tartrate (LOPRESSOR) 25 MG tablet Take 25 mg by mouth 2 (two) times daily.   Multiple Vitamin (MULTIVITAMIN) tablet Take 1 tablet by mouth daily.   omeprazole (PRILOSEC) 20 MG capsule TAKE 1 CAPSULE BY MOUTH TWICE DAILY (Patient taking differently: Take 20 mg by mouth daily.)   oxybutynin (DITROPAN) 5 MG tablet TAKE ONE TABLET BY MOUTH TWICE A DAY   Probiotic Product (PROBIOTIC DAILY PO) Take 1 capsule by mouth daily.   rosuvastatin (CRESTOR) 10 MG tablet TAKE ONE TABLET BY MOUTH DAILY   [DISCONTINUED] gabapentin (NEURONTIN) 300 MG capsule Take 300 mg by mouth 2 (two) times daily. (Patient not taking: Reported on 06/22/2022)   No facility-administered encounter medications on file as of 06/22/2022.     Lab Results  Component Value Date   WBC 7.8 12/12/2021   HGB 12.4 12/12/2021   HCT 37.0 12/12/2021  PLT 215.0 12/12/2021   GLUCOSE 174 (H) 06/18/2022   CHOL 140 06/18/2022   TRIG 194.0 (H) 06/18/2022   HDL 53.40 06/18/2022   LDLDIRECT 58.0 06/01/2021   LDLCALC 48 06/18/2022   ALT 22 06/18/2022   AST 16 06/18/2022   NA 139 06/18/2022   K 4.3 06/18/2022   CL 101 06/18/2022   CREATININE 0.87 06/18/2022   BUN 28 (H) 06/18/2022   CO2 26 06/18/2022   TSH  0.53 03/19/2022   INR 0.99 11/15/2012   HGBA1C 8.3 (H) 06/18/2022   MICROALBUR 3.1 (H) 12/12/2021       Assessment & Plan:   Problem List Items Addressed This Visit     Anemia    Follow cbc. Overdue colonoscopy.       Atrial fibrillation (HCC)    On flecaininde, metoprolol and aspirin.  Appears to be in SR. Stable.        Chronic venous insufficiency    Has been evaluated.  Previous - lymphedema pump.       Diabetes mellitus ([place]) - Primary    On metformin, tresiba and jardiance.  Tolerating jardiance.  Discussed low carb diet and exercise.  Follow met b and a1c.a1c increased.   Instructed to stay hydrated.  Discussed other treatment options.  Discussed importance of checking sugars on a more regular basis (including pm sugars).  Refer back to [facility] for diabetes management.        Relevant Orders   AMB Referral to Pharmacy Medication Management   Hemoglobin A1c   GERD (gastroesophageal reflux disease)    No acid reflux reported.  Prilosec.       Health care maintenance    Physical today 06/21/22.  Colonoscopy 09/2015 - recommended f/u in 5 years.  Overdue.  Mammogram 06/05/21 - Birads I.        Hypercholesteremia    On crestor.  Low cholesterol diet and exercise.  Follow lipid panel and liver function tests.        Relevant Orders   Hepatic function panel   Lipid panel   Hypertension    Continue lisinopril, hctz and metoprolol.  Blood pressure doing well.  Continue current medication.  Follow pressures.       Relevant Orders   Basic metabolic panel   Obstructive sleep apnea    CPAP.       Other Visit Diagnoses     Need for immunization against influenza       Relevant Orders   Flu Vaccine QUAD High Dose(Fluad) (Completed)   Encounter for screening mammogram for malignant neoplasm of breast       Relevant Orders   MM 3D SCREEN BREAST BILATERAL        Einar Pheasant, MD

## 2022-06-29 ENCOUNTER — Other Ambulatory Visit: Payer: Self-pay | Admitting: Internal Medicine

## 2022-07-01 ENCOUNTER — Encounter: Payer: Self-pay | Admitting: Internal Medicine

## 2022-07-01 NOTE — Assessment & Plan Note (Signed)
Follow cbc.  Overdue colonoscopy.   

## 2022-07-01 NOTE — Assessment & Plan Note (Signed)
On crestor.  Low cholesterol diet and exercise.  Follow lipid panel and liver function tests.   

## 2022-07-01 NOTE — Assessment & Plan Note (Signed)
Continue lisinopril, hctz and metoprolol.  Blood pressure doing well.  Continue current medication.  Follow pressures.

## 2022-07-01 NOTE — Assessment & Plan Note (Signed)
CPAP.  

## 2022-07-01 NOTE — Assessment & Plan Note (Signed)
On flecaininde, metoprolol and aspirin.  Appears to be in SR. Stable.

## 2022-07-01 NOTE — Assessment & Plan Note (Signed)
On metformin, tresiba and jardiance.  Tolerating jardiance.  Discussed low carb diet and exercise.  Follow met b and a1c.a1c increased.   Instructed to stay hydrated.  Discussed other treatment options.  Discussed importance of checking sugars on a more regular basis (including pm sugars).  Refer back to [facility] for diabetes management.

## 2022-07-01 NOTE — Assessment & Plan Note (Signed)
No acid reflux reported.  Prilosec.  

## 2022-07-01 NOTE — Assessment & Plan Note (Signed)
Has been evaluated.  Previous - lymphedema pump.

## 2022-07-03 ENCOUNTER — Telehealth: Payer: Self-pay

## 2022-07-03 NOTE — Chronic Care Management (AMB) (Signed)
   Care Guide Note  07/03/2022 Name: Christine Robertson MRN: [id number] DOB: 04/26/1956  Referred by: Einar Pheasant, MD Reason for referral : Care Coordination (Outreach to schedule with Pharm D )   Christine Robertson is a 66 y.o. year old female who is a primary care patient of Einar Pheasant, MD. Christine Robertson was referred to the pharmacist for assistance related to DM.    Successful contact was made with the patient to discuss pharmacy services including being ready for the pharmacist to call at least 5 minutes before the scheduled appointment time, to have medication bottles and any blood sugar or blood pressure readings ready for review. The patient agreed to meet with the pharmacist via with the pharmacist via telephone visit on 07/19/2022.    Noreene Larsson, [place], [place] [address] Direct Dial: [phone] [email or web address]

## 2022-07-19 ENCOUNTER — Other Ambulatory Visit: Payer: Medicare Other | Admitting: Pharmacist

## 2022-07-19 DIAGNOSIS — E1165 Type 2 diabetes mellitus with hyperglycemia: Secondary | ICD-10-CM

## 2022-07-19 MED ORDER — TRESIBA FLEXTOUCH 100 UNIT/ML ~~LOC~~ SOPN: 34.0000 [IU] | PEN_INJECTOR | Freq: Every day | SUBCUTANEOUS | 2 refills | Status: DC

## 2022-07-19 MED ORDER — ROSUVASTATIN CALCIUM 10 MG PO TABS: 10.0000 mg | ORAL_TABLET | Freq: Every day | ORAL | 3 refills | Status: DC

## 2022-07-19 NOTE — Patient Instructions (Addendum)
Rinaldo Cloud,   I will submit an order for the [place] CGM to [facility] and see what your insurance coverage is.   For now, increase Tresiba to 34 units daily. Continue metformin and Jardiance.   The income cut off for a 2 person household size for [place] assistance is  [phone]. Let me know if your household income is below this.   Plan out some meals that are low carbohydrate - like getting a rotisserie chicken and vegetables from [employer] instead of getting something take out! Let us know if you are interested in a referral to a nutritionist to help you plan meals.   I'll let you know when I see progress on the [place] order. You may receive a call from [facility] to confirm your insurance before I have an update. Let me know if you hear from them.   Thanks!  Catie Eppie Gibson, PharmD, [facility] [phone]

## 2022-07-19 NOTE — Progress Notes (Signed)
07/19/2022 Name: Christine Robertson MRN: [id number] DOB: 1956-01-27  Chief Complaint  Patient presents with   Medication Management   Diabetes    Christine Robertson is a 66 y.o. year old female who presented for a telephone visit.   They were referred to the pharmacist by their PCP for assistance in managing diabetes.   Subjective:  Care Team: Primary Care Provider: Einar Pheasant, MD ; Next Scheduled Visit: 09/27/21  Medication Access/Adherence  Current Pharmacy:  [facility] [id number], [place] [address] Phone: [phone] Fax: [phone]   Patient reports affordability concerns with their medications: No  Patient reports access/transportation concerns to their pharmacy: No  Patient reports adherence concerns with their medications:  No    Reports she thinks they are still over income for patient assistance, but will confirm  Diabetes:  Current medications: metformin XR 1000 mg twice daily, Jardiance 25 mg daily, Tresiba 30 units daily Medications tried in the past: Ozempic - thought exacerbated GI upset. Per last visit, GI was amenable to use of incretin agent even with diagnosis of gastroparesis  Current glucose readings: fasting: 120-160s; 2 hour post prandial 180-230s  Previously considered CGM but was too expensive at this time.   Patient denies hypoglycemic s/sx including dizziness, shakiness, sweating.   Current meal patterns:  - Breakfast: egg mcmuffin but generally makes her own - english muffin, piece of canadian bacon, egg; occasionally cheese;  - Lunch: sandwich, sub,  - Supper: reports she thinks this is her worst meal; eats out - burger and fries; harder to cook lately because of back pain; using a rollator - Snacks: pack of crackers, peanut butter crackers; lemon cookies; avoids chips because she knows she has a hard time with portion control  - Drinks: water; diet coke; infrequent 2% milk    Current physical activity: limited by back pain  Current medication access support: over income for assistance  Hypertension:  Current medications: HCTZ 25 mg daily, lisinopril 20 mg daily, metoprolol tartrate 25 mg twice daily   Hyperlipidemia/ASCVD Risk Reduction  Current lipid lowering medications: rosuvastatin 10 mg daily    Health Maintenance  Health Maintenance Due  Topic Date Due   COVID-19 Vaccine (3 - Pfizer series) 01/06/2020   MAMMOGRAM  05/29/2022   FOOT EXAM  06/05/2022     Objective: Lab Results  Component Value Date   HGBA1C 8.3 (H) 06/18/2022    Lab Results  Component Value Date   CREATININE 0.87 06/18/2022   BUN 28 (H) 06/18/2022   NA 139 06/18/2022   K 4.3 06/18/2022   CL 101 06/18/2022   CO2 26 06/18/2022    Lab Results  Component Value Date   CHOL 140 06/18/2022   HDL 53.40 06/18/2022   LDLCALC 48 06/18/2022   LDLDIRECT 58.0 06/01/2021   TRIG 194.0 (H) 06/18/2022   CHOLHDL 3 06/18/2022    Medications Reviewed Today     Reviewed by Osker Mason, RPH-CPP (Pharmacist) on 07/19/22 at 1340  Med List Status: <None>   Medication Order Taking? Sig Documenting Provider Last Dose Status Informant  Ascorbic Acid (VITAMIN C) 1000 MG tablet [id number] Yes Take 1,000 mg by mouth daily. [provider] Taking Active   aspirin 81 MG tablet [id number] Yes Take 81 mg by mouth daily. [provider] Taking Active Self  cetirizine (ZYRTEC) 10 MG tablet [id number] Yes Take 10 mg by mouth daily. [provider] Taking Active  Self  flecainide (TAMBOCOR) 100 MG tablet [id number] Yes Take 100 mg by mouth 2 (two) times daily. [provider] Taking Active   hydrochlorothiazide (HYDRODIURIL) 25 MG tablet [id number] Yes TAKE ONE TABLET BY MOUTH DAILY Einar Pheasant, MD Taking Active   insulin degludec (TRESIBA FLEXTOUCH) 100 UNIT/ML FlexTouch Pen [id number] Yes INJECT 10 UNITS SUB-Q DAILY AND TITRATE AS DIRECTED UP TO A  MAXIMUM DOSE OF 30 UNITS Einar Pheasant, MD Taking Active            Med Note Carpenter, Zakhi Dupre T   Thu Jul 19, 2022  1:40 PM) 30 units daily  Insulin Pen Needle (PEN NEEDLES) 32G X 4 MM MISC [id number] Yes Use to inject insulin daily Einar Pheasant, MD Taking Active   JARDIANCE 25 MG TABS tablet [id number] Yes TAKE 1 TABLET BY MOUTH DAILY BEFORE Laveda Norman, MD Taking Active   levalbuterol [facility] HFA) 45 MCG/ACT inhaler [id number] No 1-2 puffs tid prn  Patient not taking: Reported on 07/19/2022   Einar Pheasant, MD Not Taking Active   lisinopril (ZESTRIL) 20 MG tablet [id number] Yes TAKE ONE TABLET BY MOUTH DAILY Dutch Quint B, FNP Taking Active   metFORMIN (GLUCOPHAGE-XR) 500 MG 24 hr tablet [id number] Yes TAKE TWO TABLETS BY MOUTH TWICE A DAY Einar Pheasant, MD Taking Active   metoprolol tartrate (LOPRESSOR) 25 MG tablet [id number] Yes Take 25 mg by mouth 2 (two) times daily. Teodoro Spray, MD Taking Active   Multiple Vitamin (MULTIVITAMIN) tablet [id number] Yes Take 1 tablet by mouth daily. [provider] Taking Active Self  omeprazole (PRILOSEC) 20 MG capsule [id number] Yes TAKE 1 CAPSULE BY MOUTH TWICE DAILY  Patient taking differently: Take 20 mg by mouth daily.   Einar Pheasant, MD Taking Active            Med Note Nat Christen Nov 19, 2019 11:08 AM) Once daily   oxybutynin (DITROPAN) 5 MG tablet [id number] Yes TAKE ONE TABLET BY MOUTH TWICE A DAY Dutch Quint B, FNP Taking Active   Probiotic Product (PROBIOTIC DAILY PO) [id number] Yes Take 1 capsule by mouth daily. [provider] Taking Active   psyllium (REGULOID) 0.52 g capsule [id number] Yes Take 2 g by mouth daily. [provider] Taking Active   rosuvastatin (CRESTOR) 10 MG tablet [id number] Yes TAKE ONE TABLET BY MOUTH DAILY Einar Pheasant, MD Taking Active             SDOH Interventions Today    Flowsheet Row Most Recent Value  SDOH Interventions   Financial  Strain Interventions Other (Comment)  [discussed access]       Assessment/Plan:   Diabetes: - Currently uncontrolled - Reviewed long term cardiovascular and renal outcomes of uncontrolled blood sugar - Reviewed goal A1c, goal fasting, and goal 2 hour post prandial glucose - Reviewed dietary modifications including: focusing on reducing carbohydrate portion sizes - Recommend to consider CGM. Order placed via [place] to see insurance coverage.  - Discussed trial of low dose Mounjaro. Given cost (patient in Medicare Coverage Gap), she declines to trial at this time. Recommend to increase Tresiba by ~10% to 34 units daily. Continue Jardiance 25 mg daily. Will continue to titrate basal insulin until goal fastings are consistently met or we reach ~ 0.5 mg/kg basal insulin (~70 units). Continue metformin and Jardiance.   Hypertension: - Currently controlled - Recommend to continue current regimen   Hyperlipidemia/ASCVD Risk Reduction: - Currently controlled.   Follow Up Plan: pending  CGM access  Catie Hedwig Morton, PharmD, [facility] [phone]

## 2022-07-25 ENCOUNTER — Other Ambulatory Visit: Payer: Self-pay | Admitting: Internal Medicine

## 2022-07-25 DIAGNOSIS — E1165 Type 2 diabetes mellitus with hyperglycemia: Secondary | ICD-10-CM

## 2022-08-02 ENCOUNTER — Ambulatory Visit
Admission: RE | Admit: 2022-08-02 | Discharge: 2022-08-02 | Disposition: A | Discharge status: Home / Self Care | Payer: Medicare Other | Source: Ambulatory Visit | Attending: Internal Medicine | Admitting: Internal Medicine

## 2022-08-02 DIAGNOSIS — Z1231 Encounter for screening mammogram for malignant neoplasm of breast: Secondary | ICD-10-CM | POA: Diagnosis not present

## 2022-08-29 ENCOUNTER — Other Ambulatory Visit: Payer: Self-pay | Admitting: Internal Medicine

## 2022-08-29 DIAGNOSIS — E1165 Type 2 diabetes mellitus with hyperglycemia: Secondary | ICD-10-CM

## 2022-09-24 ENCOUNTER — Other Ambulatory Visit: Payer: Medicare Other

## 2022-09-27 ENCOUNTER — Ambulatory Visit: Payer: Medicare Other | Admitting: Internal Medicine

## 2022-09-29 ENCOUNTER — Other Ambulatory Visit: Payer: Self-pay | Admitting: Internal Medicine

## 2022-10-15 ENCOUNTER — Ambulatory Visit (INDEPENDENT_AMBULATORY_CARE_PROVIDER_SITE_OTHER): Payer: Medicare Other

## 2022-10-15 VITALS — Ht 63.5 in | Wt 287.0 lb

## 2022-10-15 DIAGNOSIS — Z0000 Encounter for general adult medical examination without abnormal findings: Secondary | ICD-10-CM | POA: Diagnosis not present

## 2022-10-15 NOTE — Progress Notes (Signed)
Subjective:   Christine Robertson is a 67 y.o. female who presents for Medicare Annual (Subsequent) preventive examination.  Review of Systems    No ROS.  Medicare Wellness Virtual Visit.  Visual/audio telehealth visit, UTA vital signs.   See social history for additional risk factors.   Cardiac Risk Factors include: advanced age (>70mn, >>74women);diabetes mellitus;hypertension     Objective:    Today's Vitals   10/15/22 1321  Weight: 287 lb (130.2 kg)  Height: 5' 3.5" (1.613 m)   Body mass index is 50.04 kg/m.     10/15/2022    1:05 PM 04/24/2022    8:46 AM 04/10/2022    9:33 AM 09/21/2021    9:49 AM 08/08/2020    1:36 PM 06/13/2020    1:43 PM  Advanced Directives  Does Patient Have a Medical Advance Directive? No No No No No No  Would patient like information on creating a medical advance directive? No - Patient declined  Yes (MAU/Ambulatory/Procedural Areas - Information given) No - Patient declined  No - Patient declined    Current Medications (verified) Outpatient Encounter Medications as of 10/15/2022  Medication Sig   Ascorbic Acid (VITAMIN C) 1000 MG tablet Take 1,000 mg by mouth daily.   aspirin 81 MG tablet Take 81 mg by mouth daily.   cetirizine (ZYRTEC) 10 MG tablet Take 10 mg by mouth daily.   DROPLET PEN NEEDLES 32G X 4 MM MISC USE TO INJECT INSULIN DAILY   flecainide (TAMBOCOR) 100 MG tablet Take 100 mg by mouth 2 (two) times daily.   hydrochlorothiazide (HYDRODIURIL) 25 MG tablet TAKE 1 TABLET BY MOUTH DAILY   insulin degludec (TRESIBA FLEXTOUCH) 100 UNIT/ML FlexTouch Pen Inject 34 Units into the skin daily. Titrated as instructed to 50 units daily   JARDIANCE 25 MG TABS tablet TAKE 1 TABLET BY MOUTH DAILY BEFORE BREAKFAST   levalbuterol (XOPENEX HFA) 45 MCG/ACT inhaler 1-2 puffs tid prn (Patient not taking: Reported on 07/19/2022)   lisinopril (ZESTRIL) 20 MG tablet TAKE ONE TABLET BY MOUTH DAILY   metFORMIN (GLUCOPHAGE-XR) 500 MG 24 hr tablet TAKE 2  TABLETS BY MOUTH TWICE A DAY   metoprolol tartrate (LOPRESSOR) 25 MG tablet Take 25 mg by mouth 2 (two) times daily.   Multiple Vitamin (MULTIVITAMIN) tablet Take 1 tablet by mouth daily.   omeprazole (PRILOSEC) 20 MG capsule TAKE 1 CAPSULE BY MOUTH TWICE DAILY (Patient taking differently: Take 20 mg by mouth daily.)   oxybutynin (DITROPAN) 5 MG tablet TAKE ONE TABLET BY MOUTH TWICE A DAY   Probiotic Product (PROBIOTIC DAILY PO) Take 1 capsule by mouth daily.   psyllium (REGULOID) 0.52 g capsule Take 2 g by mouth daily.   rosuvastatin (CRESTOR) 10 MG tablet Take 1 tablet (10 mg total) by mouth daily.   No facility-administered encounter medications on file as of 10/15/2022.    Allergies (verified) Erythromycin, Augmentin [amoxicillin-pot clavulanate], and Demerol [meperidine]   History: Past Medical History:  Diagnosis Date   Abnormal results of thyroid function studies    Allergy    hay fever   Angina pectoris, unspecified (HCC)    Arthritis    Atrial fibrillation, unspecified    Chicken pox    Cold hands    Congenital anomaly of adrenal gland    Diabetes mellitus    type 2, uncomplicated   Essential hypertension, benign    Family history of adverse reaction to anesthesia    Mother - PONV   GERD (gastroesophageal reflux  disease)    IBS (irritable bowel syndrome)    Malaise    Morbid obesity (HCC)    Murmur    Pain in soft tissues of limb    Proteinuria    Pure hypercholesterolemia    Rash and other nonspecific skin eruption    Sleep apnea    Obstructive; Uses C-Pap machine   Urinary incontinence    Vertigo    1 episode, approx 2013   Wears contact lenses    left eye   Past Surgical History:  Procedure Laterality Date   ABDOMINAL HYSTERECTOMY  2010   BLADDER SURGERY  1967   stretching of bladder   BREAST BIOPSY Left 03/28/2015   done at Dr. Dwyane Luo office. fibrocystic changes   CATARACT EXTRACTION W/PHACO Right 04/10/2022   Procedure: CATARACT EXTRACTION PHACO  AND INTRAOCULAR LENS PLACEMENT ([place]) RIGHT DIABETIC VIVITY LENS;  Surgeon: Birder Robson, MD;  Location: [facility];  Service: Ophthalmology;  Laterality: Right;  6.89 0:40.8   CATARACT EXTRACTION W/PHACO Left 04/24/2022   Procedure: CATARACT EXTRACTION PHACO AND INTRAOCULAR LENS PLACEMENT ([facility]) LEFT DIABETIC VIVITY  toric LENS 5.29 00:41.8;  Surgeon: Birder Robson, MD;  Location: [facility];  Service: Ophthalmology;  Laterality: Left;  Diabetic   COLONOSCOPY     COLONOSCOPY WITH PROPOFOL N/A 09/09/2015   Procedure: COLONOSCOPY WITH PROPOFOL;  Surgeon: Manya Silvas, MD;  Location[facility];  Service: Endoscopy;  Laterality: N/A;   ESOPHAGOGASTRODUODENOSCOPY     TONSILLECTOMY AND ADENOIDECTOMY  1959   URETHRAL DILATION     Family History  Problem Relation Age of Onset   Diabetes Mother    Cancer Father        lung   Breast cancer Maternal Grandmother 30   Breast cancer Maternal Aunt 60   Social History   Socioeconomic History   Marital status: Married    Spouse name: Not on file   Number of children: Not on file   Years of education: Not on file   Highest education level: Not on file  Occupational History   Not on file  Tobacco Use   Smoking status: Never   Smokeless tobacco: Never  Vaping Use   Vaping Use: Never used  Substance and Sexual Activity   Alcohol use: No    Alcohol/week: 0.0 standard drinks of alcohol   Drug use: No   Sexual activity: Not on file  Other Topics Concern   Not on file  Social History Narrative   Not on file   Social Determinants of Health   Financial Resource Strain: Low Risk  (10/15/2022)   Overall Financial Resource Strain (CARDIA)    Difficulty of Paying Living Expenses: Not hard at all  Recent Concern: Financial Resource Strain - Medium Risk (07/19/2022)   Overall Financial Resource Strain (CARDIA)    Difficulty of Paying Living Expenses: Somewhat hard  Food Insecurity: No Food Insecurity (10/15/2022)    Hunger Vital Sign    Worried About Running Out of Food in the Last Year: Never true    [place] in the Last Year: Never true  Transportation Needs: No Transportation Needs (10/15/2022)   PRAPARE - Hydrologist (Medical): No    Lack of Transportation (Non-Medical): No  Physical Activity: Inactive (10/15/2022)   Exercise Vital Sign    Days of Exercise per Week: 0 days    Minutes of Exercise per Session: 0 min  Stress: No Stress Concern Present (10/15/2022)   [place]  [facility]    Feeling of Stress : Not at all  Social Connections: Unknown (10/15/2022)   Social Connection and Isolation Panel [NHANES]    Frequency of Communication with Friends and Family: More than three times a week    Frequency of Social Gatherings with Friends and Family: Three times a week    Attends Religious Services: Not on file    Active Member of Clubs or Organizations: No    Attends Archivist Meetings: Not on file    Marital Status: Married    Tobacco Counseling Counseling given: Not Answered   Clinical Intake:  Pre-visit preparation completed: Yes        Diabetes: Yes (Followed by pcp)  How often do you need to have someone help you when you read instructions, pamphlets, or other written materials from your doctor or pharmacy?: 1 - Never  Nutrition Risk Assessment: Has the patient had any N/V/D within the last 2 months?  No  Does the patient have any non-healing wounds?  No  Has the patient had any unintentional weight loss or weight gain?  No   Diabetes: Is the patient diabetic?  Yes  If diabetic, was a CBG obtained today?  Yes , FBS 145 Did the patient bring in their glucometer from home?  No  How often do you monitor your CBG's? 1-2x daily.   Financial Strains and Diabetes Management: Are you having any financial strains with the device, your supplies or your medication? No .  Does the  patient want to be seen by Chronic Care Management for management of their diabetes?  No  Would the patient like to be referred to a Nutritionist or for Diabetic Management?  No   Diabetic Exams: Diabetic Eye Exam: Completed 05/04/22. Diabetic Foot Exam: Completed 06/22/22.     Interpreter Needed?: No      Activities of Daily Living    10/15/2022    1:07 PM 10/15/2022    9:11 AM  In your present state of health, do you have any difficulty performing the following activities:  Hearing? 0 0  Vision? 0 0  Difficulty concentrating or making decisions? 0 0  Walking or climbing stairs? 1 1  Comment Rollator in use   Dressing or bathing? 0 0  Doing errands, shopping? 0 0  Preparing Food and eating ? N N  Using the Toilet? N N  In the past six months, have you accidently leaked urine? Y Y  Comment Managed with daily pad and medication. Followed by pcp   Do you have problems with loss of bowel control? N N  Managing your Medications? N N  Managing your Finances? N N  Housekeeping or managing your Housekeeping? N N    Patient Care Team: Einar Pheasant, MD as PCP - General (Internal Medicine) Einar Pheasant, MD (Internal Medicine) Bary Castilla, Forest Gleason, MD (General Surgery) Osker Mason, RPH-CPP (Pharmacist)  Indicate any recent Medical Services you may have received from other than [facility] providers in the past year (date may be approximate).     Assessment:   This is a routine wellness examination for Christine Robertson.  I connected with  Christine Robertson on 10/15/22 by a audio enabled telemedicine application and verified that I am speaking with the correct person using two identifiers.  Patient Location: Home  Provider Location: Office/Clinic  I discussed the limitations of evaluation and management by telemedicine. The patient expressed understanding and agreed to proceed.  Hearing/Vision screen Hearing Screening - Comments:: Patient is able to hear conversational tones without  difficulty. No issues reported. Vision Screening - Comments:: Wears corrective lenses  No retinopathy reported They have seen their ophthalmologist in the last 12 months.    Dietary issues and exercise activities discussed: Current Exercise Habits: Home exercise routine, Type of exercise: walking, Intensity: Mild   Goals Addressed               This Visit's Progress     Patient Stated     Increase physical activity (pt-stated)        Healthy diet. Weight loss.       Depression Screen    10/15/2022    1:25 PM 03/22/2022    2:37 PM 02/08/2022    3:44 PM 09/21/2021    9:53 AM 08/08/2021    3:53 PM 06/05/2021    2:16 PM 06/13/2020    1:42 PM  PHQ 2/9 Scores  PHQ - 2 Score 0 0 0 0 0 0 0    Fall Risk    10/15/2022    1:06 PM 10/15/2022    9:11 AM 03/22/2022    2:37 PM 02/08/2022    3:44 PM 09/21/2021    9:53 AM  [place] in the past year? 0 0 0 0 0  Number falls in past yr: 0  0  0  Injury with Fall? 0  0    Risk for fall due to :   No Fall Risks No Fall Risks   Risk for fall due to: Comment rollator in use      Follow up Falls evaluation completed;Falls prevention discussed  Falls evaluation completed Falls evaluation completed Falls evaluation completed    FALL RISK PREVENTION PERTAINING TO THE HOME: Home free of loose throw rugs in walkways, pet beds, electrical cords, etc? Yes  Adequate lighting in your home to reduce risk of falls? Yes   ASSISTIVE DEVICES UTILIZED TO PREVENT FALLS: Life alert? No  Use of a cane, walker or w/c? Yes  Grab bars in the bathroom? No  Shower chair or bench in shower? Yes , as needed Comfort chair height toilet? Yes   TIMED UP AND GO: Was the test performed? No .   Cognitive Function:        10/15/2022    1:15 PM 09/21/2021   10:16 AM  6CIT Screen  What Year? 0 points 0 points  What month? 0 points 0 points  What time? 0 points 0 points  Count back from 20 0 points 0 points  Months in reverse 0 points 0 points   Repeat phrase 0 points 0 points  Total Score 0 points 0 points    Immunizations Immunization History  Administered Date(s) Administered   Fluad Quad(high Dose 65+) 05/10/2020, 06/05/2021, 06/22/2022   Influenza Split 06/06/2012, 06/03/2014   Influenza,inj,Quad PF,6+ Mos 06/23/2013, 05/18/2015, 07/16/2016, 07/03/2017   PFIZER(Purple Top)SARS-COV-2 Vaccination 10/16/2019, 11/11/2019   PNEUMOCOCCAL CONJUGATE-20 03/02/2021   Pneumococcal Polysaccharide-23 08/31/2016   Zoster, Live 06/01/2015   TDAP status: Due, Education has been provided regarding the importance of this vaccine. Advised may receive this vaccine at local pharmacy or Health Dept. Aware to provide a copy of the vaccination record if obtained from local pharmacy or Health Dept. Verbalized acceptance and understanding.  Covid-19 vaccine status: Completed vaccines   Shingrix Completed?: No.    Education has been provided regarding the importance of this vaccine. Patient has been advised to call insurance  company to determine out of pocket expense if they have not yet received this vaccine. Advised may also receive vaccine at local pharmacy or Health Dept. Verbalized acceptance and understanding.  Screening Tests Health Maintenance  Topic Date Due   DTaP/Tdap/Td (1 - Tdap) Never done   COVID-19 Vaccine (3 - 2023-24 season) 10/31/2022 (Originally 05/04/2022)   DEXA SCAN  11/02/2022 (Originally 04/09/2019)   Zoster Vaccines- Shingrix (1 of 2) 01/13/2023 (Originally 04/08/2004)   Diabetic kidney evaluation - Urine ACR  12/13/2022   HEMOGLOBIN A1C  12/18/2022   OPHTHALMOLOGY EXAM  05/22/2023   Diabetic kidney evaluation - eGFR measurement  06/19/2023   FOOT EXAM  06/23/2023   MAMMOGRAM  08/03/2023   Medicare Annual Wellness (AWV)  10/16/2023   COLONOSCOPY (Pts 45-52yr Insurance coverage will need to be confirmed)  09/08/2025   Pneumonia Vaccine 67 Years old  Completed   INFLUENZA VACCINE  Completed   Hepatitis C Screening   Completed   HPV VACCINES  Aged Out    Health Maintenance Health Maintenance Due  Topic Date Due   DTaP/Tdap/Td (1 - Tdap) Never done   Bone density- deferred per patient preference.   Lung Cancer Screening: (Low Dose CT Chest recommended if Age 69-80years, 30 pack-year currently smoking OR have quit w/in 15years.) does not qualify.   Hepatitis C Screening: Completed 08/2016.  Vision Screening: Recommended annual ophthalmology exams for early detection of glaucoma and other disorders of the eye.  Dental Screening: Recommended annual dental exams for proper oral hygiene  Community Resource Referral / Chronic Care Management: CRR required this visit?  No   CCM required this visit?  No      Plan:     I have personally reviewed and noted the following in the patient's chart:   Medical and social history Use of alcohol, tobacco or illicit drugs  Current medications and supplements including opioid prescriptions. Patient is not currently taking opioid prescriptions. Functional ability and status Nutritional status Physical activity Advanced directives List of other physicians Hospitalizations, surgeries, and ER visits in previous 12 months Vitals Screenings to include cognitive, depression, and falls Referrals and appointments  In addition, I have reviewed and discussed with patient certain preventive protocols, quality metrics, and best practice recommendations. A written personalized care plan for preventive services as well as general preventive health recommendations were provided to patient.     DLeta Jungling LPN   [id number]

## 2022-10-15 NOTE — Patient Instructions (Addendum)
Christine Robertson , Thank you for taking time to come for your Medicare Wellness Visit. I appreciate your ongoing commitment to your health goals. Please review the following plan we discussed and let me know if I can assist you in the future.   These are the goals we discussed:  Goals       Patient Stated     Increase physical activity (pt-stated)      Healthy diet. Weight loss.        This is a list of the screening recommended for you and due dates:  Health Maintenance  Topic Date Due   DTaP/Tdap/Td vaccine (1 - Tdap) Never done   COVID-19 Vaccine (3 - 2023-24 season) 10/31/2022*   DEXA scan (bone density measurement)  11/02/2022*   Zoster (Shingles) Vaccine (1 of 2) 01/13/2023*   Yearly kidney health urinalysis for diabetes  12/13/2022   Hemoglobin A1C  12/18/2022   Eye exam for diabetics  05/22/2023   Yearly kidney function blood test for diabetes  06/19/2023   Complete foot exam   06/23/2023   Mammogram  08/03/2023   Medicare Annual Wellness Visit  10/16/2023   Colon Cancer Screening  09/08/2025   Pneumonia Vaccine  Completed   Flu Shot  Completed   Hepatitis C Screening: USPSTF Recommendation to screen - Ages 18-79 yo.  Completed   HPV Vaccine  Aged Out  *Topic was postponed. The date shown is not the original due date.    Advanced directives: not yet completed  Conditions/risks identified: none new  Next appointment: Follow up in one year for your annual wellness visit    Preventive Care 65 Years and Older, Female Preventive care refers to lifestyle choices and visits with your health care provider that can promote health and wellness. What does preventive care include? A yearly physical exam. This is also called an annual well check. Dental exams once or twice a year. Routine eye exams. Ask your health care provider how often you should have your eyes checked. Personal lifestyle choices, including: Daily care of your teeth and gums. Regular physical  activity. Eating a healthy diet. Avoiding tobacco and drug use. Limiting alcohol use. Practicing safe sex. Taking low-dose aspirin every day. Taking vitamin and mineral supplements as recommended by your health care provider. What happens during an annual well check? The services and screenings done by your health care provider during your annual well check will depend on your age, overall health, lifestyle risk factors, and family history of disease. Counseling  Your health care provider may ask you questions about your: Alcohol use. Tobacco use. Drug use. Emotional well-being. Home and relationship well-being. Sexual activity. Eating habits. History of falls. Memory and ability to understand (cognition). Work and work Statistician. Reproductive health. Screening  You may have the following tests or measurements: Height, weight, and BMI. Blood pressure. Lipid and cholesterol levels. These may be checked every 5 years, or more frequently if you are over 19 years old. Skin check. Lung cancer screening. You may have this screening every year starting at age 33 if you have a 30-pack-year history of smoking and currently smoke or have quit within the past 15 years. Fecal occult blood test (FOBT) of the stool. You may have this test every year starting at age 55. Flexible sigmoidoscopy or colonoscopy. You may have a sigmoidoscopy every 5 years or a colonoscopy every 10 years starting at age 27. Hepatitis C blood test. Hepatitis B blood test. Sexually transmitted disease (STD) testing. Diabetes  screening. This is done by checking your blood sugar (glucose) after you have not eaten for a while (fasting). You may have this done every 1-3 years. Bone density scan. This is done to screen for osteoporosis. You may have this done starting at age 54. Mammogram. This may be done every 1-2 years. Talk to your health care provider about how often you should have regular mammograms. Talk with your  health care provider about your test results, treatment options, and if necessary, the need for more tests. Vaccines  Your health care provider may recommend certain vaccines, such as: Influenza vaccine. This is recommended every year. Tetanus, diphtheria, and acellular pertussis (Tdap, Td) vaccine. You may need a Td booster every 10 years. Zoster vaccine. You may need this after age 83. Pneumococcal 13-valent conjugate (PCV13) vaccine. One dose is recommended after age 25. Pneumococcal polysaccharide (PPSV23) vaccine. One dose is recommended after age 75. Talk to your health care provider about which screenings and vaccines you need and how often you need them. This information is not intended to replace advice given to you by your health care provider. Make sure you discuss any questions you have with your health care provider. Document Released: 09/16/2015 Document Revised: 05/09/2016 Document Reviewed: 06/21/2015 Elsevier Interactive Patient Education  2017 [place] Prevention in the Home Falls can cause injuries. They can happen to people of all ages. There are many things you can do to make your home safe and to help prevent falls. What can I do on the outside of my home? Regularly fix the edges of walkways and driveways and fix any cracks. Remove anything that might make you trip as you walk through a door, such as a raised step or threshold. Trim any bushes or trees on the path to your home. Use bright outdoor lighting. Clear any walking paths of anything that might make someone trip, such as rocks or tools. Regularly check to see if handrails are loose or broken. Make sure that both sides of any steps have handrails. Any raised decks and porches should have guardrails on the edges. Have any leaves, snow, or ice cleared regularly. Use sand or salt on walking paths during winter. Clean up any spills in your garage right away. This includes oil or grease spills. What can I  do in the bathroom? Use night lights. Install grab bars by the toilet and in the tub and shower. Do not use towel bars as grab bars. Use non-skid mats or decals in the tub or shower. If you need to sit down in the shower, use a plastic, non-slip stool. Keep the floor dry. Clean up any water that spills on the floor as soon as it happens. Remove soap buildup in the tub or shower regularly. Attach bath mats securely with double-sided non-slip rug tape. Do not have throw rugs and other things on the floor that can make you trip. What can I do in the bedroom? Use night lights. Make sure that you have a light by your bed that is easy to reach. Do not use any sheets or blankets that are too big for your bed. They should not hang down onto the floor. Have a firm chair that has side arms. You can use this for support while you get dressed. Do not have throw rugs and other things on the floor that can make you trip. What can I do in the kitchen? Clean up any spills right away. Avoid walking on wet floors. Keep  items that you use a lot in easy-to-reach places. If you need to reach something above you, use a strong step stool that has a grab bar. Keep electrical cords out of the way. Do not use floor polish or wax that makes floors slippery. If you must use wax, use non-skid floor wax. Do not have throw rugs and other things on the floor that can make you trip. What can I do with my stairs? Do not leave any items on the stairs. Make sure that there are handrails on both sides of the stairs and use them. Fix handrails that are broken or loose. Make sure that handrails are as long as the stairways. Check any carpeting to make sure that it is firmly attached to the stairs. Fix any carpet that is loose or worn. Avoid having throw rugs at the top or bottom of the stairs. If you do have throw rugs, attach them to the floor with carpet tape. Make sure that you have a light switch at the top of the stairs  and the bottom of the stairs. If you do not have them, ask someone to add them for you. What else can I do to help prevent falls? Wear shoes that: Do not have high heels. Have rubber bottoms. Are comfortable and fit you well. Are closed at the toe. Do not wear sandals. If you use a stepladder: Make sure that it is fully opened. Do not climb a closed stepladder. Make sure that both sides of the stepladder are locked into place. Ask someone to hold it for you, if possible. Clearly mark and make sure that you can see: Any grab bars or handrails. First and last steps. Where the edge of each step is. Use tools that help you move around (mobility aids) if they are needed. These include: Canes. Walkers. Scooters. Crutches. Turn on the lights when you go into a dark area. Replace any light bulbs as soon as they burn out. Set up your furniture so you have a clear path. Avoid moving your furniture around. If any of your floors are uneven, fix them. If there are any pets around you, be aware of where they are. Review your medicines with your doctor. Some medicines can make you feel dizzy. This can increase your chance of falling. Ask your doctor what other things that you can do to help prevent falls. This information is not intended to replace advice given to you by your health care provider. Make sure you discuss any questions you have with your health care provider. Document Released: 06/16/2009 Document Revised: 01/26/2016 Document Reviewed: 09/24/2014 Elsevier Interactive Patient Education  2017 [employer].

## 2022-10-19 ENCOUNTER — Telehealth: Payer: Self-pay | Admitting: Internal Medicine

## 2022-10-19 NOTE — Telephone Encounter (Signed)
Pt need a refill on lisinopril sent to [facility]

## 2022-10-22 ENCOUNTER — Other Ambulatory Visit: Payer: Self-pay

## 2022-10-22 MED ORDER — LISINOPRIL 20 MG PO TABS: 20.0000 mg | ORAL_TABLET | Freq: Every day | ORAL | 1 refills | Status: DC

## 2022-10-22 NOTE — Telephone Encounter (Signed)
Medication refilled

## 2022-11-21 LAB — HM DIABETES EYE EXAM

## 2022-11-28 ENCOUNTER — Other Ambulatory Visit: Payer: Self-pay

## 2022-11-28 ENCOUNTER — Other Ambulatory Visit (INDEPENDENT_AMBULATORY_CARE_PROVIDER_SITE_OTHER): Payer: Medicare Other

## 2022-11-28 ENCOUNTER — Telehealth: Payer: Self-pay | Admitting: Internal Medicine

## 2022-11-28 DIAGNOSIS — E1165 Type 2 diabetes mellitus with hyperglycemia: Secondary | ICD-10-CM | POA: Diagnosis not present

## 2022-11-28 DIAGNOSIS — I10 Essential (primary) hypertension: Secondary | ICD-10-CM | POA: Diagnosis not present

## 2022-11-28 DIAGNOSIS — E7800 Pure hypercholesterolemia, unspecified: Secondary | ICD-10-CM | POA: Diagnosis not present

## 2022-11-28 LAB — HEMOGLOBIN A1C: Hgb A1c MFr Bld: 8.3 % — ABNORMAL HIGH (ref 4.6–6.5)

## 2022-11-28 LAB — BASIC METABOLIC PANEL
BUN: 31 mg/dL — ABNORMAL HIGH (ref 6–23)
CO2: 29 mEq/L (ref 19–32)
Calcium: 9.6 mg/dL (ref 8.4–10.5)
Chloride: 104 mEq/L (ref 96–112)
Creatinine, Ser: 0.87 mg/dL (ref 0.40–1.20)
GFR: 68.35 mL/min (ref >60.00)
Glucose, Bld: 163 mg/dL — ABNORMAL HIGH (ref 70–99)
Potassium: 4.2 mEq/L (ref 3.5–5.1)
Sodium: 142 mEq/L (ref 135–145)

## 2022-11-28 LAB — HEPATIC FUNCTION PANEL
ALT: 21 U/L (ref 0–35)
AST: 17 U/L (ref 0–37)
Albumin: 4.2 g/dL (ref 3.5–5.2)
Alkaline Phosphatase: 59 U/L (ref 39–117)
Bilirubin, Direct: 0.1 mg/dL (ref 0.0–0.3)
Total Bilirubin: 0.5 mg/dL (ref 0.2–1.2)
Total Protein: 6.8 g/dL (ref 6.0–8.3)

## 2022-11-28 LAB — LIPID PANEL
Cholesterol: 145 mg/dL (ref 0–200)
HDL: 51.6 mg/dL (ref >39.00)
LDL Cholesterol: 60 mg/dL (ref 0–99)
NonHDL: 93.78
Total CHOL/HDL Ratio: 3
Triglycerides: 169 mg/dL — ABNORMAL HIGH (ref 0.0–149.0)
VLDL: 33.8 mg/dL (ref 0.0–40.0)

## 2022-11-28 MED ORDER — OXYBUTYNIN CHLORIDE 5 MG PO TABS: 5.0000 mg | ORAL_TABLET | Freq: Two times a day (BID) | ORAL | 1 refills | Status: DC

## 2022-11-28 NOTE — Telephone Encounter (Signed)
Medication refilled

## 2022-11-28 NOTE — Telephone Encounter (Signed)
Pt need a refill on oxybutynin sent to [facility]

## 2022-11-30 ENCOUNTER — Ambulatory Visit: Payer: Medicare Other | Admitting: Internal Medicine

## 2022-12-04 ENCOUNTER — Ambulatory Visit (INDEPENDENT_AMBULATORY_CARE_PROVIDER_SITE_OTHER): Payer: Medicare Other | Admitting: Internal Medicine

## 2022-12-04 ENCOUNTER — Encounter: Payer: Self-pay | Admitting: Internal Medicine

## 2022-12-04 VITALS — BP 138/76 | HR 77 | Temp 98.4°F | Resp 16 | Ht 63.5 in | Wt 288.0 lb

## 2022-12-04 DIAGNOSIS — Z1211 Encounter for screening for malignant neoplasm of colon: Secondary | ICD-10-CM

## 2022-12-04 DIAGNOSIS — E2839 Other primary ovarian failure: Secondary | ICD-10-CM | POA: Diagnosis not present

## 2022-12-04 DIAGNOSIS — D649 Anemia, unspecified: Secondary | ICD-10-CM | POA: Diagnosis not present

## 2022-12-04 DIAGNOSIS — E7800 Pure hypercholesterolemia, unspecified: Secondary | ICD-10-CM

## 2022-12-04 DIAGNOSIS — M79601 Pain in right arm: Secondary | ICD-10-CM

## 2022-12-04 DIAGNOSIS — E1165 Type 2 diabetes mellitus with hyperglycemia: Secondary | ICD-10-CM

## 2022-12-04 DIAGNOSIS — Z1231 Encounter for screening mammogram for malignant neoplasm of breast: Secondary | ICD-10-CM

## 2022-12-04 DIAGNOSIS — K219 Gastro-esophageal reflux disease without esophagitis: Secondary | ICD-10-CM

## 2022-12-04 DIAGNOSIS — I4891 Unspecified atrial fibrillation: Secondary | ICD-10-CM

## 2022-12-04 DIAGNOSIS — I10 Essential (primary) hypertension: Secondary | ICD-10-CM

## 2022-12-04 DIAGNOSIS — G4733 Obstructive sleep apnea (adult) (pediatric): Secondary | ICD-10-CM

## 2022-12-04 MED ORDER — METFORMIN HCL ER 500 MG PO TB24
1000.0000 mg | ORAL_TABLET | Freq: Two times a day (BID) | ORAL | 1 refills | Status: DC
Start: 2022-12-04 — End: 2023-07-11

## 2022-12-04 MED ORDER — LISINOPRIL 20 MG PO TABS: 20.0000 mg | ORAL_TABLET | Freq: Every day | ORAL | 1 refills | Status: DC

## 2022-12-04 MED ORDER — EMPAGLIFLOZIN 25 MG PO TABS: 25.0000 mg | ORAL_TABLET | Freq: Every day | ORAL | 5 refills | Status: DC

## 2022-12-04 MED ORDER — HYDROCHLOROTHIAZIDE 25 MG PO TABS: 25.0000 mg | ORAL_TABLET | Freq: Every day | ORAL | 1 refills | Status: DC

## 2022-12-04 MED ORDER — METOPROLOL TARTRATE 25 MG PO TABS: 25.0000 mg | ORAL_TABLET | Freq: Two times a day (BID) | ORAL | 1 refills | Status: DC

## 2022-12-04 NOTE — Progress Notes (Signed)
Subjective:    Patient ID: Christine Robertson, female    DOB: Dec 20, 1955, 67 y.o.   MRN: [id number]  Patient here for  Chief Complaint  Patient presents with   Medical Management of Chronic Issues    HPI Here to follow up regarding hypercholesterolemia, afib, diabetes and hypertension. On flecainide, metoprolol and aspirin.  Sugars have not been controlled.  [name] recently increased - 34 units tresiba now. AM sugars 139/160s up to 199.  Pm sugars 160s.  Discussed f/u with Catie. Continues on jardiance and metformin.  Has been having increased pain - right arm - 3-4 months.  Increased pain - reaching across her body.  Arm - heavy.  She is having to use her arm to push herself up out of the chair.  Uses a rollator and has to rest her arm on the rollator.  Has been taking ibuprofen and tylenol.  Persistent pain - discussed referral to ortho.  Limited rom.  Also with history of afib.  Was seeing Dr Lady Gary.  Request f/u with [facility] cardiology.  Has been one year since seen.  No chest pain reported.  Breathing stable.  No increased cough or congestion.     Past Medical History:  Diagnosis Date   Abnormal results of thyroid function studies    Allergy    hay fever   Angina pectoris, unspecified    Arthritis    Atrial fibrillation, unspecified    Chicken pox    Cold hands    Congenital anomaly of adrenal gland    Diabetes mellitus    type 2, uncomplicated   Essential hypertension, benign    Family history of adverse reaction to anesthesia    Mother - PONV   GERD (gastroesophageal reflux disease)    IBS (irritable bowel syndrome)    Malaise    Morbid obesity    Murmur    Pain in soft tissues of limb    Proteinuria    Pure hypercholesterolemia    Rash and other nonspecific skin eruption    Sleep apnea    Obstructive; Uses C-Pap machine   Urinary incontinence    Vertigo    1 episode, approx 2013   Wears contact lenses    left eye   Past Surgical History:  Procedure Laterality  Date   ABDOMINAL HYSTERECTOMY  2010   BLADDER SURGERY  1967   stretching of bladder   BREAST BIOPSY Left 03/28/2015   done at Dr. Rutherford Nail office. fibrocystic changes   CATARACT EXTRACTION W/PHACO Right 04/10/2022   Procedure: CATARACT EXTRACTION PHACO AND INTRAOCULAR LENS PLACEMENT (IOC) RIGHT DIABETIC VIVITY LENS;  Surgeon: Galen Manila, MD;  Location: [facility];  Service: Ophthalmology;  Laterality: Right;  6.89 0:40.8   CATARACT EXTRACTION W/PHACO Left 04/24/2022   Procedure: CATARACT EXTRACTION PHACO AND INTRAOCULAR LENS PLACEMENT (IOC) LEFT DIABETIC VIVITY  toric LENS 5.29 00:41.8;  Surgeon: Galen Manila, MD;  Location: [facility];  Service: Ophthalmology;  Laterality: Left;  Diabetic   COLONOSCOPY     COLONOSCOPY WITH PROPOFOL N/A 09/09/2015   Procedure: COLONOSCOPY WITH PROPOFOL;  Surgeon: Scot Jun, MD;  Location: [facility];  Service: Endoscopy;  Laterality: N/A;   ESOPHAGOGASTRODUODENOSCOPY     TONSILLECTOMY AND ADENOIDECTOMY  1959   URETHRAL DILATION     Family History  Problem Relation Age of Onset   Diabetes Mother    Cancer Father        lung   Breast cancer Maternal Grandmother 18  Breast cancer Maternal Aunt 17   Social History   Socioeconomic History   Marital status: Married    Spouse name: Not on file   Number of children: Not on file   Years of education: Not on file   Highest education level: 12th grade  Occupational History   Not on file  Tobacco Use   Smoking status: Never   Smokeless tobacco: Never  Vaping Use   Vaping Use: Never used  Substance and Sexual Activity   Alcohol use: No    Alcohol/week: 0.0 standard drinks of alcohol   Drug use: No   Sexual activity: Not on file  Other Topics Concern   Not on file  Social History Narrative   Not on file   Social Determinants of Health   Financial Resource Strain: Low Risk  (12/01/2022)   Overall Financial Resource Strain (CARDIA)    Difficulty of Paying  Living Expenses: Not very hard  Food Insecurity: No Food Insecurity (12/01/2022)   Hunger Vital Sign    Worried About Running Out of Food in the Last Year: Never true    Ran Out of Food in the Last Year: Never true  Transportation Needs: No Transportation Needs (12/01/2022)   PRAPARE - Administrator, Civil Service (Medical): No    Lack of Transportation (Non-Medical): No  Physical Activity: Inactive (12/01/2022)   Exercise Vital Sign    Days of Exercise per Week: 0 days    Minutes of Exercise per Session: 0 min  Stress: No Stress Concern Present (12/01/2022)   [employer] of Occupational Health - Occupational Stress Questionnaire    Feeling of Stress : Not at all  Social Connections: Socially Integrated (12/01/2022)   Social Connection and Isolation Panel [NHANES]    Frequency of Communication with Friends and Family: More than three times a week    Frequency of Social Gatherings with Friends and Family: More than three times a week    Attends Religious Services: More than 4 times per year    Active Member of [employer] or Organizations: Yes    Attends Engineer, structural: More than 4 times per year    Marital Status: Married     Review of Systems  Constitutional:  Negative for appetite change and unexpected weight change.  HENT:  Negative for congestion and sinus pressure.   Respiratory:  Negative for cough and chest tightness.        Breathing stable.   Cardiovascular:  Negative for chest pain and palpitations.  Gastrointestinal:  Negative for abdominal pain, diarrhea, nausea and vomiting.  Genitourinary:  Negative for difficulty urinating and dysuria.  Musculoskeletal:  Negative for joint swelling.       Right arm pain as outlined.   Skin:  Negative for color change and rash.  Neurological:  Negative for dizziness and headaches.  Psychiatric/Behavioral:  Negative for agitation and dysphoric mood.        Objective:     BP 138/76   Pulse 77   Temp  98.4 F (36.9 C)   Resp 16   Ht 5' 3.5" (1.613 m)   Wt 288 lb (130.6 kg)   SpO2 97%   BMI 50.22 kg/m  Wt Readings from Last 3 Encounters:  12/04/22 288 lb (130.6 kg)  10/15/22 287 lb (130.2 kg)  06/22/22 287 lb 12.8 oz (130.5 kg)    Physical Exam Vitals reviewed.  Constitutional:      General: She is not in acute distress.  Appearance: Normal appearance.  HENT:     Head: Normocephalic and atraumatic.     Right Ear: External ear normal.     Left Ear: External ear normal.  Eyes:     General: No scleral icterus.       Right eye: No discharge.        Left eye: No discharge.     Conjunctiva/sclera: Conjunctivae normal.  Neck:     Thyroid: No thyromegaly.  Cardiovascular:     Rate and Rhythm: Normal rate and regular rhythm.  Pulmonary:     Effort: No respiratory distress.     Breath sounds: Normal breath sounds. No wheezing.  Abdominal:     General: Bowel sounds are normal.     Palpations: Abdomen is soft.     Tenderness: There is no abdominal tenderness.  Musculoskeletal:        General: No swelling.     Cervical back: Neck supple. No tenderness.     Comments: Increased pain with attempts at abduction - especially at or above 90 degrees.  Increased pain - upper arm with reaching around her body. Limited rom.   Lymphadenopathy:     Cervical: No cervical adenopathy.  Skin:    Findings: No erythema or rash.  Neurological:     Mental Status: She is alert.  Psychiatric:        Mood and Affect: Mood normal.        Behavior: Behavior normal.      Outpatient Encounter Medications as of 12/04/2022  Medication Sig   Ascorbic Acid (VITAMIN C) 1000 MG tablet Take 1,000 mg by mouth daily.   aspirin 81 MG tablet Take 81 mg by mouth daily.   cetirizine (ZYRTEC) 10 MG tablet Take 10 mg by mouth daily.   DROPLET PEN NEEDLES 32G X 4 MM MISC USE TO INJECT INSULIN DAILY   empagliflozin (JARDIANCE) 25 MG TABS tablet Take 1 tablet (25 mg total) by mouth daily before breakfast.    flecainide (TAMBOCOR) 100 MG tablet Take 100 mg by mouth 2 (two) times daily.   hydrochlorothiazide (HYDRODIURIL) 25 MG tablet Take 1 tablet (25 mg total) by mouth daily.   insulin degludec (TRESIBA FLEXTOUCH) 100 UNIT/ML FlexTouch Pen Inject 34 Units into the skin daily. Titrated as instructed to 50 units daily   levalbuterol (XOPENEX HFA) 45 MCG/ACT inhaler 1-2 puffs tid prn (Patient not taking: Reported on 07/19/2022)   lisinopril (ZESTRIL) 20 MG tablet Take 1 tablet (20 mg total) by mouth daily.   metFORMIN (GLUCOPHAGE-XR) 500 MG 24 hr tablet Take 2 tablets (1,000 mg total) by mouth 2 (two) times daily.   metoprolol tartrate (LOPRESSOR) 25 MG tablet Take 1 tablet (25 mg total) by mouth 2 (two) times daily.   Multiple Vitamin (MULTIVITAMIN) tablet Take 1 tablet by mouth daily.   omeprazole (PRILOSEC) 20 MG capsule TAKE 1 CAPSULE BY MOUTH TWICE DAILY (Patient taking differently: Take 20 mg by mouth daily.)   oxybutynin (DITROPAN) 5 MG tablet Take 1 tablet (5 mg total) by mouth 2 (two) times daily.   Probiotic Product (PROBIOTIC DAILY PO) Take 1 capsule by mouth daily.   psyllium (REGULOID) 0.52 g capsule Take 2 g by mouth daily.   rosuvastatin (CRESTOR) 10 MG tablet Take 1 tablet (10 mg total) by mouth daily.   [DISCONTINUED] hydrochlorothiazide (HYDRODIURIL) 25 MG tablet TAKE 1 TABLET BY MOUTH DAILY   [DISCONTINUED] JARDIANCE 25 MG TABS tablet TAKE 1 TABLET BY MOUTH DAILY BEFORE BREAKFAST   [DISCONTINUED]  lisinopril (ZESTRIL) 20 MG tablet Take 1 tablet (20 mg total) by mouth daily.   [DISCONTINUED] metFORMIN (GLUCOPHAGE-XR) 500 MG 24 hr tablet TAKE 2 TABLETS BY MOUTH TWICE A DAY   [DISCONTINUED] metoprolol tartrate (LOPRESSOR) 25 MG tablet Take 25 mg by mouth 2 (two) times daily.   No facility-administered encounter medications on file as of 12/04/2022.     Lab Results  Component Value Date   WBC 7.8 12/12/2021   HGB 12.4 12/12/2021   HCT 37.0 12/12/2021   PLT 215.0 12/12/2021   GLUCOSE  163 (H) 11/28/2022   CHOL 145 11/28/2022   TRIG 169.0 (H) 11/28/2022   HDL 51.60 11/28/2022   LDLDIRECT 58.0 06/01/2021   LDLCALC 60 11/28/2022   ALT 21 11/28/2022   AST 17 11/28/2022   NA 142 11/28/2022   K 4.2 11/28/2022   CL 104 11/28/2022   CREATININE 0.87 11/28/2022   BUN 31 (H) 11/28/2022   CO2 29 11/28/2022   TSH 0.53 03/19/2022   INR 0.99 11/15/2012   HGBA1C 8.3 (H) 11/28/2022   MICROALBUR 3.1 (H) 12/12/2021    MM 3D SCREEN BREAST BILATERAL  Result Date: 08/03/2022 CLINICAL DATA:  Screening. EXAM: DIGITAL SCREENING BILATERAL MAMMOGRAM WITH TOMOSYNTHESIS AND CAD TECHNIQUE: Bilateral screening digital craniocaudal and mediolateral oblique mammograms were obtained. Bilateral screening digital breast tomosynthesis was performed. The images were evaluated with computer-aided detection. COMPARISON:  Previous exam(s). ACR Breast Density Category b: There are scattered areas of fibroglandular density. FINDINGS: There are no findings suspicious for malignancy. IMPRESSION: No mammographic evidence of malignancy. A result letter of this screening mammogram will be mailed directly to the patient. RECOMMENDATION: Screening mammogram in one year. (Code:SM-B-01Y) BI-RADS CATEGORY  1: Negative. Electronically Signed   By: Ted Mcalpine M.D.   On: 08/03/2022 16:35       Assessment & Plan:  Type 2 diabetes mellitus with hyperglycemia, without long-term current use of insulin Assessment & Plan: On metformin, tresiba and jardiance.  Tolerating jardiance.  Discussed low carb diet and exercise.  Follow met b and a1c.A1c.  Instructed to stay hydrated.  Discussed other treatment options.  Discussed importance of checking sugars on a more regular basis (including pm sugars).  Tresiba increased - taking 34 units per day.  Get her back in with Catie for f/u.   Orders: -     Hemoglobin A1c; Future -     Microalbumin / creatinine urine ratio; Future -     metFORMIN HCl ER; Take 2 tablets (1,000 mg  total) by mouth 2 (two) times daily.  Dispense: 360 tablet; Refill: 1  Estrogen deficiency -     DG Bone Density; Future  Hypercholesteremia Assessment & Plan: On crestor.  Low cholesterol diet and exercise.  Follow lipid panel and liver function tests.    Orders: -     Hepatic function panel; Future -     Lipid panel; Future  Anemia, unspecified type Assessment & Plan: Follow cbc. Overdue colonoscopy. Refer to GI.   Orders: -     CBC with Differential/Platelet; Future -     Basic metabolic panel; Future -     TSH; Future  Visit for screening mammogram -     3D Screening Mammogram, Left and Right; Future  Colon cancer screening -     Ambulatory referral to Gastroenterology  Atrial fibrillation, unspecified type Assessment & Plan: On flecaininde, metoprolol and aspirin.  Appears to be in SR. Stable.  Has been followed by Dr Lady Gary.  Needs f/u  appt with cardiology.  Dr Lady Gary has retired.     Gastroesophageal reflux disease, unspecified whether esophagitis present Assessment & Plan: No acid reflux reported.  Prilosec.    Primary hypertension Assessment & Plan: Continue lisinopril, hctz and metoprolol.  Blood pressure doing well.  Continue current medication.  Follow pressures.    Obstructive sleep apnea Assessment & Plan: CPAP.    Pain of right upper extremity Assessment & Plan: Increased pain - upper arm.  Limited rom.  Has to use her arm to push herself up out of a chair.  Given persistent pain, refer to ortho for further evaluation and treatment.  Taking tylenol and ibuprofen.   Orders: -     Ambulatory referral to Orthopedic Surgery  Other orders -     hydroCHLOROthiazide; Take 1 tablet (25 mg total) by mouth daily.  Dispense: 90 tablet; Refill: 1 -     Empagliflozin; Take 1 tablet (25 mg total) by mouth daily before breakfast.  Dispense: 30 tablet; Refill: 5 -     Lisinopril; Take 1 tablet (20 mg total) by mouth daily.  Dispense: 90 tablet; Refill: 1 -      Metoprolol Tartrate; Take 1 tablet (25 mg total) by mouth 2 (two) times daily.  Dispense: 180 tablet; Refill: 1     Dale Salisbury, MD

## 2022-12-08 ENCOUNTER — Encounter: Payer: Self-pay | Admitting: Internal Medicine

## 2022-12-08 ENCOUNTER — Telehealth: Payer: Self-pay | Admitting: Internal Medicine

## 2022-12-08 DIAGNOSIS — E1165 Type 2 diabetes mellitus with hyperglycemia: Secondary | ICD-10-CM

## 2022-12-08 DIAGNOSIS — M79603 Pain in arm, unspecified: Secondary | ICD-10-CM | POA: Insufficient documentation

## 2022-12-08 NOTE — Assessment & Plan Note (Signed)
On metformin, tresiba and jardiance.  Tolerating jardiance.  Discussed low carb diet and exercise.  Follow met b and a1c.A1c.  Instructed to stay hydrated.  Discussed other treatment options.  Discussed importance of checking sugars on a more regular basis (including pm sugars).  Tresiba increased - taking 34 units per day.  Get her back in with Catie for f/u.

## 2022-12-08 NOTE — Telephone Encounter (Signed)
Needs a f/u appt with cardiology.  Previously was seeing Dr Lady Gary [place].  With history of afib.  Needs f/u appt.  Also, needs f/u with Catie - diabetes management.

## 2022-12-08 NOTE — Assessment & Plan Note (Signed)
CPAP.  

## 2022-12-08 NOTE — Assessment & Plan Note (Signed)
No acid reflux reported.  Prilosec.  

## 2022-12-08 NOTE — Assessment & Plan Note (Signed)
Continue lisinopril, hctz and metoprolol.  Blood pressure doing well.  Continue current medication.  Follow pressures.  

## 2022-12-08 NOTE — Assessment & Plan Note (Signed)
On flecaininde, metoprolol and aspirin.  Appears to be in SR. Stable.  Has been followed by Dr Lady Gary.  Needs f/u appt with cardiology.  Dr Lady Gary has retired.

## 2022-12-08 NOTE — Assessment & Plan Note (Signed)
Increased pain - upper arm.  Limited rom.  Has to use her arm to push herself up out of a chair.  Given persistent pain, refer to ortho for further evaluation and treatment.  Taking tylenol and ibuprofen.

## 2022-12-08 NOTE — Assessment & Plan Note (Signed)
Follow cbc. Overdue colonoscopy. Refer to GI.

## 2022-12-08 NOTE — Assessment & Plan Note (Signed)
On crestor.  Low cholesterol diet and exercise.  Follow lipid panel and liver function tests.   

## 2022-12-12 NOTE — Telephone Encounter (Signed)
May 8- 9:00 am Dr Lorelle Gibbs [facility].  Patient is aware,.

## 2022-12-12 NOTE — Telephone Encounter (Signed)
Referral to Catie placed.

## 2022-12-12 NOTE — Addendum Note (Signed)
Addended by: Rita Ohara D on: 12/12/2022 01:54 PM   Modules accepted: Orders

## 2022-12-31 ENCOUNTER — Other Ambulatory Visit: Payer: Medicare Other | Admitting: Pharmacist

## 2022-12-31 DIAGNOSIS — E1165 Type 2 diabetes mellitus with hyperglycemia: Secondary | ICD-10-CM

## 2022-12-31 MED ORDER — TRESIBA FLEXTOUCH 100 UNIT/ML ~~LOC~~ SOPN
38.0000 [IU] | PEN_INJECTOR | Freq: Every day | SUBCUTANEOUS | 2 refills | Status: DC
Start: 2022-12-31 — End: 2023-02-08

## 2022-12-31 MED ORDER — EMPAGLIFLOZIN 25 MG PO TABS: 25.0000 mg | ORAL_TABLET | Freq: Every day | ORAL | 1 refills | Status: DC

## 2022-12-31 NOTE — Progress Notes (Signed)
12/31/2022 Name: Christine Robertson MRN: [id number] DOB: 09-Nov-1955  Chief Complaint  Patient presents with   Medication Management   Diabetes   Hypertension   Hyperlipidemia    Christine Robertson is a 67 y.o. year old female who presented for a telephone visit.   They were referred to the pharmacist by their PCP for assistance in managing diabetes, hypertension, and hyperlipidemia.    Subjective:  Care Team: Primary Care Provider: Dale [place], MD ; Next Scheduled Visit: 03/05/23 Cardiologist: Lorelle Gibbs; Next Scheduled Visit: 01/09/23  .patqu Medication Access/Adherence  Current Pharmacy:  [id number] - [place], [place] - [address] [place] [address] Phone: [phone] Fax: [phone]   Patient reports affordability concerns with their medications: Yes  Patient reports access/transportation concerns to their pharmacy: Yes  Patient reports adherence concerns with their medications:  Yes    Reports she has not been taking insulin consistently;   Mychart Amb Medication Adherence Questionnaire   12/31/2022 10:49 AM EDT - Ceasar Mons by Patient  During the past 2 weeks, have you missed any doses of your medication(s)? Yes  During the past 2 weeks, have you missed any doses of your medications because you could not afford them? No  During the past 2 weeks, have you missed any doses of your medications because you could not get to the pharmacy? No  During the past 2 weeks, have you missed any doses of your medications because you could not get refills? No  During the past 2 weeks, have you missed any doses of your medication because you do not know what the medication is for? No  During the past 2 weeks, have you missed any doses of your medications because your medications make you feel sick? No  During the past 2 weeks, have you missed any doses of your medications because you have too many medications? No  During the past 2 weeks, have you  missed any doses of your medications because you forgot to take your medications? Yes  During the past 2 weeks, have you missed any doses of your medications because you feel fine and do not think you need the medication(s)? No  Have you missed doses of your medications in the past 2 weeks for any other reason not listed above? No      Diabetes:  Current medications: Jardiance 25 mg daily, metformin XR 1000 mg twice daily, Tresiba 34 units daily  Medications tried in past: previously on GLP1, held due to stomach upset but was noted by GI that she could re-try incretin therapy. She reports today that   Current glucose readings: fastings: 130-150s generally; post (lunch) prandial: 140-150, occasional excursions to 180-190s  Patient denies hypoglycemic s/sx including dizziness, shakiness, sweating.  Current meal patterns:  - Breakfast (9-10 am): egg and cheese on whole wheat (thin, fiber) - Lunch: (2-3 pm): sandwich on whole wheat; sometimes take out- meat and 2 vegetables  - Supper: same as above;  - Snacks: none lately;  - Drinks: diet coke, water  Current medication access support: over income for GLP1 assistance.   Hypertension:  Current medications: lisinopril 20 mg daily, HCTZ 25 mg daily, metoprolol tartrate 25 mg twice daily  Hyperlipidemia/ASCVD Risk Reduction  Current lipid lowering medications: rosuvastatin 10 mg daily  Atrial Fibrillation:  Current medications: Rate Control: metoprolol tartrate twice daily  Rhythm Control:  Anticoagulation Regimen: none  CHADS2VASc = 4 (Age, HTN, DM, female)  Prior cardiologist recommended to avoid  anticoagulation as patient had frequently been in normal sinus rhythm  Objective:  Lab Results  Component Value Date   HGBA1C 8.3 (H) 11/28/2022    Lab Results  Component Value Date   CREATININE 0.87 11/28/2022   BUN 31 (H) 11/28/2022   NA 142 11/28/2022   K 4.2 11/28/2022   CL 104 11/28/2022   CO2 29 11/28/2022    Lab  Results  Component Value Date   CHOL 145 11/28/2022   HDL 51.60 11/28/2022   LDLCALC 60 11/28/2022   LDLDIRECT 58.0 06/01/2021   TRIG 169.0 (H) 11/28/2022   CHOLHDL 3 11/28/2022    Medications Reviewed Today     Reviewed by Alden Hipp, RPH-CPP (Pharmacist) on 12/31/22 at 1056  Med List Status: <None>   Medication Order Taking? Sig Documenting Provider Last Dose Status Informant  Ascorbic Acid (VITAMIN C) 1000 MG tablet [id number] Yes Take 1,000 mg by mouth daily. [provider] Taking Active   aspirin 81 MG tablet [id number] Yes Take 81 mg by mouth daily. [provider] Taking Active Self  cetirizine (ZYRTEC) 10 MG tablet [id number] Yes Take 10 mg by mouth daily. [provider] Taking Active Self  DROPLET PEN NEEDLES 32G X 4 MM MISC [id number] Yes USE TO INJECT INSULIN DAILY Dale [place], MD Taking Active   empagliflozin (JARDIANCE) 25 MG TABS tablet [id number] Yes Take 1 tablet (25 mg total) by mouth daily before breakfast. Dale [place], MD Taking Active   flecainide (TAMBOCOR) 100 MG tablet [id number] Yes Take 100 mg by mouth 2 (two) times daily. [provider] Taking Active   hydrochlorothiazide (HYDRODIURIL) 25 MG tablet [id number] Yes Take 1 tablet (25 mg total) by mouth daily. Dale [place], MD Taking Active   insulin degludec [facility]) 100 UNIT/ML FlexTouch Pen [id number] Yes Inject 34 Units into the skin daily. Titrated as instructed to 50 units daily Dale [place], MD Taking Active   levalbuterol [facility] HFA) 45 MCG/ACT inhaler [id number]  1-2 puffs tid prn  Patient not taking: Reported on 07/19/2022   Dale [place], MD  Active   lisinopril (ZESTRIL) 20 MG tablet [id number] Yes Take 1 tablet (20 mg total) by mouth daily. Dale [place], MD Taking Active   metFORMIN (GLUCOPHAGE-XR) 500 MG 24 hr tablet [id number] Yes Take 2 tablets (1,000 mg total) by mouth 2 (two) times daily. Dale [place], MD Taking Active    metoprolol tartrate (LOPRESSOR) 25 MG tablet [id number] Yes Take 1 tablet (25 mg total) by mouth 2 (two) times daily. Dale [place], MD Taking Active   Multiple Vitamin (MULTIVITAMIN) tablet [id number] Yes Take 1 tablet by mouth daily. [provider] Taking Active Self  omeprazole (PRILOSEC) 20 MG capsule [id number] Yes TAKE 1 CAPSULE BY MOUTH TWICE DAILY  Patient taking differently: Take 20 mg by mouth daily.   Dale [place], MD Taking Active            Med Note Tyrone Nine Nov 19, 2019 11:08 AM) Once daily   oxybutynin (DITROPAN) 5 MG tablet [id number] Yes Take 1 tablet (5 mg total) by mouth 2 (two) times daily. Dale [place], MD Taking Active   Probiotic Product (PROBIOTIC DAILY PO) [id number] Yes Take 1 capsule by mouth daily. [provider] Taking Active   psyllium (REGULOID) 0.52 g capsule [id number] Yes Take 2 g by mouth daily. [provider] Taking Active   rosuvastatin (CRESTOR) 10 MG tablet [id number] Yes Take 1 tablet (10 mg total) by mouth daily. Dale [place],  MD Taking Active               Assessment/Plan:   Diabetes: - Currently uncontrolled - Reviewed long term cardiovascular and renal outcomes of uncontrolled blood sugar - Reviewed goal A1c, goal fasting, and goal 2 hour post prandial glucose - Reviewed consideration for GLP1; however, patient notes frequently had stomach upset while on Ozempic, does not want to try again at this time, even at a lower dose.  - Scheduled follow up appointment in office to walk through placing CGM for patient.  - Reviewed dietary modifications including: focus on proteins, vegetables and fruits; whole grains - Reviewed lifestyle modifications including: increase physical activity as able - Recommend to increase Tresiba to 38 units daily. Continue metformin.   Hypertension: - Currently controlled - Recommend to continue current regimen at this time  Hyperlipidemia/ASCVD Risk  Reduction: - Currently controlled.  - Recommend to continue current regimen at this time  Atrial Fibrillation: - Currently controlled  - Recommend to continue current regimen at this time    Follow Up Plan: in person in 2 weeks  Catie Eppie Gibson, PharmD, BCACP, CPP [facility] [phone]

## 2022-12-31 NOTE — Patient Instructions (Signed)
Kayler,   Feel free to go ahead and place the [place] 2 CGM.   Increase Tresiba to 38 units daily.   Please reach out with any questions!  Catie Eppie Gibson, PharmD, BCACP, CPP [facility] [phone]

## 2023-01-15 ENCOUNTER — Ambulatory Visit (INDEPENDENT_AMBULATORY_CARE_PROVIDER_SITE_OTHER): Payer: Self-pay | Admitting: Pharmacist

## 2023-01-15 DIAGNOSIS — E1165 Type 2 diabetes mellitus with hyperglycemia: Secondary | ICD-10-CM

## 2023-01-15 NOTE — Progress Notes (Signed)
Care Coordination  Met with patient in office today to educate on Libre 3. Patient placed first sensor. Educate on use, alarms. Ensured connection to clinic [facility] account.   Continue current regimen at this time. Follow up call scheduled in ~ 4 weeks.  Catie Eppie Gibson, PharmD, BCACP, CPP [facility] [phone]

## 2023-02-08 ENCOUNTER — Encounter: Payer: Self-pay | Admitting: Pharmacist

## 2023-02-08 ENCOUNTER — Other Ambulatory Visit: Payer: Self-pay | Admitting: Pharmacist

## 2023-02-08 DIAGNOSIS — E1165 Type 2 diabetes mellitus with hyperglycemia: Secondary | ICD-10-CM

## 2023-02-08 MED ORDER — TRESIBA FLEXTOUCH 100 UNIT/ML ~~LOC~~ SOPN
42.0000 [IU] | PEN_INJECTOR | Freq: Every day | SUBCUTANEOUS | 2 refills | Status: DC
Start: 2023-02-08 — End: 2023-02-25

## 2023-02-08 NOTE — Progress Notes (Signed)
Care Coordination Call  Patient has upcoming appointment with me that needs to be rescheduled.   Reviewed Libre readings:  Date of Download: 5/14-5/27/24 % Time CGM is active: 93% Average Glucose: 165 mg/dL Glucose Management Indicator: 7.3  Glucose Variability: 19.4 (goal <36%) Time in Goal:  - Time in range 70-180: 76% - Time above range: 24% - Time below range: 0%     Recommend to increase [place] to 42 units daily. Communicated to patient.   Follow up in 3 weeks as scheduled.   Catie Eppie Gibson, PharmD, BCACP, CPP [facility] [phone]

## 2023-02-14 ENCOUNTER — Other Ambulatory Visit: Payer: Medicare Other | Admitting: Pharmacist

## 2023-02-25 ENCOUNTER — Other Ambulatory Visit: Payer: Medicare Other | Admitting: Pharmacist

## 2023-02-25 DIAGNOSIS — E1165 Type 2 diabetes mellitus with hyperglycemia: Secondary | ICD-10-CM

## 2023-02-25 MED ORDER — TRESIBA FLEXTOUCH 100 UNIT/ML ~~LOC~~ SOPN
46.0000 [IU] | PEN_INJECTOR | Freq: Every day | SUBCUTANEOUS | 2 refills | Status: DC
Start: 2023-02-25 — End: 2023-06-07

## 2023-02-25 NOTE — Patient Instructions (Signed)
Christine Robertson,   It was great talking to you!  Please focus on reducing carbohydrate content/portion sizes with supper, until you see post-supper readings remain less than 180. Please also add in some physical activity, even if just walking with your rollator ~10 minutes after supper.   Increase Tresiba to 46 units daily.   Reach out with any questions!  Catie Eppie Gibson, PharmD, BCACP, CPP Clinical Pharmacist [facility] [phone]

## 2023-02-25 NOTE — Progress Notes (Signed)
02/25/2023 Name: Christine Robertson MRN: [id number] DOB: 1955-12-18  Chief Complaint  Patient presents with   Medication Management   Diabetes    ETTER ROYALL is a 67 y.o. year old female who presented for a telephone visit.   They were referred to the pharmacist by their PCP for assistance in managing diabetes.    Subjective:  Care Team: Primary Care Provider: Dale [place], MD ; Next Scheduled Visit: 02/28/23  Medication Access/Adherence  Current Pharmacy:  [id number] [place], [place] - [address] [place] [address] Phone: [phone] Fax: [phone]   Patient reports affordability concerns with their medications: No  Patient reports access/transportation concerns to their pharmacy: No  Patient reports adherence concerns with their medications:  No     Diabetes:  Current medications: Tresiba 42 units daily, metformin XR 1000 mg twice daily, Jardiance 25 mg daily  Date of Download: 6/8-6/21/24 % Time CGM is active: 98% Average Glucose: 162 mg/dL Glucose Management Indicator: 7.2  Glucose Variability: 19.8 (goal <36%) Time in Goal:  - Time in range 70-180: 75% - Time above range: 25% - Time below range: 0% Observed patterns: post supper elevations; notes that she has been very surprised by the impact of food on glycemic control. Notes bread, potatoes cause the highest elevations     Patient denies hypoglycemic s/sx including dizziness, shakiness, sweating. Patient denies hyperglycemic symptoms including polyuria, polydipsia, polyphagia, nocturia, neuropathy, blurred vision.  Current meal patterns: plans to cut back to whole wheat bread;   Current physical activity: little currently   Objective:  Lab Results  Component Value Date   HGBA1C 8.3 (H) 11/28/2022    Lab Results  Component Value Date   CREATININE 0.87 11/28/2022   BUN 31 (H) 11/28/2022   NA 142 11/28/2022   K 4.2 11/28/2022   CL 104  11/28/2022   CO2 29 11/28/2022    Lab Results  Component Value Date   CHOL 145 11/28/2022   HDL 51.60 11/28/2022   LDLCALC 60 11/28/2022   LDLDIRECT 58.0 06/01/2021   TRIG 169.0 (H) 11/28/2022   CHOLHDL 3 11/28/2022    Medications Reviewed Today     Reviewed by Alden Hipp, RPH-CPP (Pharmacist) on 01/15/23 at 1540  Med List Status: <None>   Medication Order Taking? Sig Documenting Provider Last Dose Status Informant  Ascorbic Acid (VITAMIN C) 500 MG CAPS [id number] Yes Take 500 mg by mouth daily. [provider] Taking Active   aspirin 81 MG tablet [id number] Yes Take 81 mg by mouth daily. [provider] Taking Active Self  cetirizine (ZYRTEC) 10 MG tablet [id number] Yes Take 10 mg by mouth daily. [provider] Taking Active Self  DROPLET PEN NEEDLES 32G X 4 MM MISC [id number]  USE TO INJECT INSULIN DAILY Dale [place], MD  Active   empagliflozin (JARDIANCE) 25 MG TABS tablet [id number] Yes Take 1 tablet (25 mg total) by mouth daily. Dale [place], MD Taking Active   flecainide [facility]) 100 MG tablet [id number] Yes Take 100 mg by mouth 2 (two) times daily. [provider] Taking Active   hydrochlorothiazide (HYDRODIURIL) 25 MG tablet [id number] Yes Take 1 tablet (25 mg total) by mouth daily. Dale [place], MD Taking Active   insulin degludec [facility]) 100 UNIT/ML FlexTouch Pen [id number] Yes Inject 38 Units into the skin daily. Titrate as instructed to 50 units daily Dale [place], MD Taking Active   levalbuterol [facility] HFA) 45 MCG/ACT inhaler [id number]  1-2 puffs tid prn  Patient not taking: Reported on 07/19/2022   Dale [place], MD  Active   lisinopril (ZESTRIL) 20 MG tablet [id number] Yes Take 1 tablet (20 mg total) by mouth daily. Dale [place], MD Taking Active   metFORMIN (GLUCOPHAGE-XR) 500 MG 24 hr tablet [id number] Yes Take 2 tablets (1,000 mg total) by mouth 2 (two) times daily. Dale [place], MD Taking Active    metoprolol tartrate (LOPRESSOR) 25 MG tablet [id number] Yes Take 1 tablet (25 mg total) by mouth 2 (two) times daily. Dale [place], MD Taking Active   Multiple Vitamin (MULTIVITAMIN) tablet [id number] Yes Take 1 tablet by mouth daily. [provider] Taking Active Self  omeprazole (PRILOSEC) 20 MG capsule [id number] Yes TAKE 1 CAPSULE BY MOUTH TWICE DAILY  Patient taking differently: Take 20 mg by mouth daily.   Dale [place], MD Taking Active            Med Note Tyrone Nine Nov 19, 2019 11:08 AM) Once daily   oxybutynin (DITROPAN) 5 MG tablet [id number] Yes Take 1 tablet (5 mg total) by mouth 2 (two) times daily. Dale [place], MD Taking Active   Probiotic Product (PROBIOTIC DAILY PO) [id number] Yes Take 1 capsule by mouth daily. [provider] Taking Active   psyllium (REGULOID) 0.52 g capsule [id number] Yes Take 2 g by mouth daily. [provider] Taking Active   rosuvastatin (CRESTOR) 10 MG tablet [id number] Yes Take 1 tablet (10 mg total) by mouth daily. Dale [place], MD Taking Active               Assessment/Plan:   Diabetes: - Currently uncontrolled but improved - Reviewed dietary modifications including: focus on minimizing carbohydrate content with supper, whether choices or portion sizes. Discussed increasing physical activity. Patient agrees to work on these two things. If continued significant post-supper elevations, recommend addition of glipizide. We discussed downsides of glipizide including hypoglycemia and risk of weight gain and why we would prefer to target nutritional and lifestyle choices first - Recommend to increase Tresiba to 46 units daily to target fasting glucose <130. Patient verbalizes understanding.  - Recommend to check glucose continuously using CGM  Follow Up Plan: PCP next week, PharmD televisit in ~ 8 weeks  Catie Eppie Gibson, PharmD, BCACP, CPP Clinical Pharmacist [facility] [phone]

## 2023-02-28 ENCOUNTER — Other Ambulatory Visit (INDEPENDENT_AMBULATORY_CARE_PROVIDER_SITE_OTHER): Payer: Medicare Other

## 2023-02-28 DIAGNOSIS — E7800 Pure hypercholesterolemia, unspecified: Secondary | ICD-10-CM

## 2023-02-28 DIAGNOSIS — E1165 Type 2 diabetes mellitus with hyperglycemia: Secondary | ICD-10-CM

## 2023-02-28 DIAGNOSIS — D649 Anemia, unspecified: Secondary | ICD-10-CM | POA: Diagnosis not present

## 2023-02-28 LAB — HEPATIC FUNCTION PANEL
ALT: 18 U/L (ref 0–35)
AST: 16 U/L (ref 0–37)
Albumin: 4.1 g/dL (ref 3.5–5.2)
Alkaline Phosphatase: 53 U/L (ref 39–117)
Bilirubin, Direct: 0.1 mg/dL (ref 0.0–0.3)
Total Bilirubin: 0.5 mg/dL (ref 0.2–1.2)
Total Protein: 6.9 g/dL (ref 6.0–8.3)

## 2023-02-28 LAB — LIPID PANEL
Cholesterol: 130 mg/dL (ref 0–200)
HDL: 49.1 mg/dL (ref >39.00)
LDL Cholesterol: 59 mg/dL (ref 0–99)
NonHDL: 81.18
Total CHOL/HDL Ratio: 3
Triglycerides: 111 mg/dL (ref 0.0–149.0)
VLDL: 22.2 mg/dL (ref 0.0–40.0)

## 2023-02-28 LAB — BASIC METABOLIC PANEL
BUN: 31 mg/dL — ABNORMAL HIGH (ref 6–23)
CO2: 27 mEq/L (ref 19–32)
Calcium: 9.9 mg/dL (ref 8.4–10.5)
Chloride: 104 mEq/L (ref 96–112)
Creatinine, Ser: 0.9 mg/dL (ref 0.40–1.20)
GFR: 65.51 mL/min (ref >60.00)
Glucose, Bld: 117 mg/dL — ABNORMAL HIGH (ref 70–99)
Potassium: 3.9 mEq/L (ref 3.5–5.1)
Sodium: 140 mEq/L (ref 135–145)

## 2023-02-28 LAB — CBC WITH DIFFERENTIAL/PLATELET
Basophils Absolute: 0.1 10*3/uL (ref 0.0–0.1)
Basophils Relative: 1 % (ref 0.0–3.0)
Eosinophils Absolute: 0.2 10*3/uL (ref 0.0–0.7)
Eosinophils Relative: 2 % (ref 0.0–5.0)
HCT: 41 % (ref 36.0–46.0)
Hemoglobin: 13.4 g/dL (ref 12.0–15.0)
Lymphocytes Relative: 17.2 % (ref 12.0–46.0)
Lymphs Abs: 1.6 10*3/uL (ref 0.7–4.0)
MCHC: 32.7 g/dL (ref 30.0–36.0)
MCV: 88.6 fl (ref 78.0–100.0)
Monocytes Absolute: 0.6 10*3/uL (ref 0.1–1.0)
Monocytes Relative: 6.7 % (ref 3.0–12.0)
Neutro Abs: 6.7 10*3/uL (ref 1.4–7.7)
Neutrophils Relative %: 73.1 % (ref 43.0–77.0)
Platelets: 227 10*3/uL (ref 150.0–400.0)
RBC: 4.63 Mil/uL (ref 3.87–5.11)
RDW: 13.7 % (ref 11.5–15.5)
WBC: 9.1 10*3/uL (ref 4.0–10.5)

## 2023-02-28 LAB — MICROALBUMIN / CREATININE URINE RATIO
Creatinine,U: 115.3 mg/dL
Microalb Creat Ratio: 0.6 mg/g (ref 0.0–30.0)
Microalb, Ur: <0.7 mg/dL (ref 0.0–1.9)

## 2023-02-28 LAB — TSH: TSH: 0.72 u[IU]/mL (ref 0.35–5.50)

## 2023-02-28 LAB — HEMOGLOBIN A1C: Hgb A1c MFr Bld: 7.5 % — ABNORMAL HIGH (ref 4.6–6.5)

## 2023-03-05 ENCOUNTER — Ambulatory Visit (INDEPENDENT_AMBULATORY_CARE_PROVIDER_SITE_OTHER): Payer: Medicare Other | Admitting: Internal Medicine

## 2023-03-05 ENCOUNTER — Encounter: Payer: Self-pay | Admitting: Internal Medicine

## 2023-03-05 VITALS — BP 128/70 | HR 61 | Temp 97.8°F | Resp 16 | Ht 63.5 in | Wt 290.0 lb

## 2023-03-05 DIAGNOSIS — I10 Essential (primary) hypertension: Secondary | ICD-10-CM

## 2023-03-05 DIAGNOSIS — G4733 Obstructive sleep apnea (adult) (pediatric): Secondary | ICD-10-CM

## 2023-03-05 DIAGNOSIS — E7800 Pure hypercholesterolemia, unspecified: Secondary | ICD-10-CM

## 2023-03-05 DIAGNOSIS — E1165 Type 2 diabetes mellitus with hyperglycemia: Secondary | ICD-10-CM | POA: Diagnosis not present

## 2023-03-05 DIAGNOSIS — K219 Gastro-esophageal reflux disease without esophagitis: Secondary | ICD-10-CM

## 2023-03-05 DIAGNOSIS — I872 Venous insufficiency (chronic) (peripheral): Secondary | ICD-10-CM | POA: Diagnosis not present

## 2023-03-05 DIAGNOSIS — I4891 Unspecified atrial fibrillation: Secondary | ICD-10-CM | POA: Diagnosis not present

## 2023-03-05 DIAGNOSIS — Z7984 Long term (current) use of oral hypoglycemic drugs: Secondary | ICD-10-CM

## 2023-03-05 NOTE — Progress Notes (Signed)
Subjective:    Patient ID: Christine Robertson, female    DOB: July 09, 1956, 67 y.o.   MRN: [id number]  Patient here for  Chief Complaint  Patient presents with   Medical Management of Chronic Issues    HPI Here to follow up regarding hypercholesterolemia, afib, diabetes and hypertension. On flecainide, metoprolol and aspirin.  Discussed recent labs.  A1c improved - 7.5.  reviewed outside readings - CGM report.  Time CGM active - 98%.  Target range 75% with high readings 23%.  No low sugars.  She is walking some with rollator walker. Discussed diet and exercise.  No chest pain.  Breathing stable.  No increased cough or congestion.  Discussed benefiber.     Past Medical History:  Diagnosis Date   Abnormal results of thyroid function studies    Allergy    hay fever   Angina pectoris, unspecified (HCC)    Arthritis    Atrial fibrillation, unspecified    Chicken pox    Cold hands    Congenital anomaly of adrenal gland    Diabetes mellitus    type 2, uncomplicated   Essential hypertension, benign    Family history of adverse reaction to anesthesia    Mother - PONV   GERD (gastroesophageal reflux disease)    IBS (irritable bowel syndrome)    Malaise    Morbid obesity (HCC)    Murmur    Pain in soft tissues of limb    Proteinuria    Pure hypercholesterolemia    Rash and other nonspecific skin eruption    Sleep apnea    Obstructive; Uses C-Pap machine   Urinary incontinence    Vertigo    1 episode, approx 2013   Wears contact lenses    left eye   Past Surgical History:  Procedure Laterality Date   ABDOMINAL HYSTERECTOMY  2010   BLADDER SURGERY  1967   stretching of bladder   BREAST BIOPSY Left 03/28/2015   done at Dr. Rutherford Nail office. fibrocystic changes   CATARACT EXTRACTION W/PHACO Right 04/10/2022   Procedure: CATARACT EXTRACTION PHACO AND INTRAOCULAR LENS PLACEMENT (IOC) RIGHT DIABETIC VIVITY LENS;  Surgeon: Galen Manila, MD;  Location: [facility];  Service:  Ophthalmology;  Laterality: Right;  6.89 0:40.8   CATARACT EXTRACTION W/PHACO Left 04/24/2022   Procedure: CATARACT EXTRACTION PHACO AND INTRAOCULAR LENS PLACEMENT (IOC) LEFT DIABETIC VIVITY  toric LENS 5.29 00:41.8;  Surgeon: Galen Manila, MD;  Location: [facility];  Service: Ophthalmology;  Laterality: Left;  Diabetic   COLONOSCOPY     COLONOSCOPY WITH PROPOFOL N/A 09/09/2015   Procedure: COLONOSCOPY WITH PROPOFOL;  Surgeon: Scot Jun, MD;  Location: [facility];  Service: Endoscopy;  Laterality: N/A;   ESOPHAGOGASTRODUODENOSCOPY     TONSILLECTOMY AND ADENOIDECTOMY  1959   URETHRAL DILATION     Family History  Problem Relation Age of Onset   Diabetes Mother    Cancer Father        lung   Breast cancer Maternal Grandmother 83   Breast cancer Maternal Aunt 21   Social History   Socioeconomic History   Marital status: Married    Spouse name: Not on file   Number of children: Not on file   Years of education: Not on file   Highest education level: 12th grade  Occupational History   Not on file  Tobacco Use   Smoking status: Never   Smokeless tobacco: Never  Vaping Use   Vaping Use: Never used  Substance and Sexual Activity   Alcohol use: No    Alcohol/week: 0.0 standard drinks of alcohol   Drug use: No   Sexual activity: Not on file  Other Topics Concern   Not on file  Social History Narrative   Not on file   Social Determinants of Health   Financial Resource Strain: Low Risk  (12/01/2022)   Overall Financial Resource Strain (CARDIA)    Difficulty of Paying Living Expenses: Not very hard  Food Insecurity: No Food Insecurity (12/01/2022)   Hunger Vital Sign    Worried About Running Out of Food in the Last Year: Never true    Ran Out of Food in the Last Year: Never true  Transportation Needs: No Transportation Needs (12/01/2022)   PRAPARE - Administrator, Civil Service (Medical): No    Lack of Transportation (Non-Medical): No   Physical Activity: Inactive (12/01/2022)   Exercise Vital Sign    Days of Exercise per Week: 0 days    Minutes of Exercise per Session: 0 min  Stress: No Stress Concern Present (12/01/2022)   [employer] of Occupational Health - Occupational Stress Questionnaire    Feeling of Stress : Not at all  Social Connections: Socially Integrated (12/01/2022)   Social Connection and Isolation Panel [NHANES]    Frequency of Communication with Friends and Family: More than three times a week    Frequency of Social Gatherings with Friends and Family: More than three times a week    Attends Religious Services: More than 4 times per year    Active Member of [employer] or Organizations: Yes    Attends Engineer, structural: More than 4 times per year    Marital Status: Married     Review of Systems  Constitutional:  Negative for appetite change and unexpected weight change.  HENT:  Negative for congestion and sinus pressure.   Respiratory:  Negative for cough, chest tightness and shortness of breath.   Cardiovascular:  Negative for chest pain and palpitations.       Chronic leg swelling.   Gastrointestinal:  Negative for abdominal pain, nausea and vomiting.  Genitourinary:  Negative for difficulty urinating and dysuria.  Musculoskeletal:  Negative for joint swelling and myalgias.  Skin:  Negative for color change and rash.  Neurological:  Negative for dizziness and headaches.  Psychiatric/Behavioral:  Negative for agitation and dysphoric mood.        Objective:     BP 128/70   Pulse 61   Temp 97.8 F (36.6 C)   Resp 16   Ht 5' 3.5" (1.613 m)   Wt 290 lb (131.5 kg)   SpO2 98%   BMI 50.57 kg/m  Wt Readings from Last 3 Encounters:  03/05/23 290 lb (131.5 kg)  12/04/22 288 lb (130.6 kg)  10/15/22 287 lb (130.2 kg)    Physical Exam Vitals reviewed.  Constitutional:      General: She is not in acute distress.    Appearance: Normal appearance.  HENT:     Head: Normocephalic  and atraumatic.     Right Ear: External ear normal.     Left Ear: External ear normal.  Eyes:     General: No scleral icterus.       Right eye: No discharge.        Left eye: No discharge.     Conjunctiva/sclera: Conjunctivae normal.  Neck:     Thyroid: No thyromegaly.  Cardiovascular:     Rate and Rhythm:  Normal rate and regular rhythm.  Pulmonary:     Effort: No respiratory distress.     Breath sounds: Normal breath sounds. No wheezing.  Abdominal:     General: Bowel sounds are normal.     Palpations: Abdomen is soft.     Tenderness: There is no abdominal tenderness.  Musculoskeletal:        General: No swelling or tenderness.     Cervical back: Neck supple. No tenderness.  Lymphadenopathy:     Cervical: No cervical adenopathy.  Skin:    Findings: No erythema or rash.  Neurological:     Mental Status: She is alert.  Psychiatric:        Mood and Affect: Mood normal.        Behavior: Behavior normal.      Outpatient Encounter Medications as of 03/05/2023  Medication Sig   aspirin 81 MG tablet Take 81 mg by mouth daily.   cetirizine (ZYRTEC) 10 MG tablet Take 10 mg by mouth daily.   DROPLET PEN NEEDLES 32G X 4 MM MISC USE TO INJECT INSULIN DAILY   empagliflozin (JARDIANCE) 25 MG TABS tablet Take 1 tablet (25 mg total) by mouth daily.   flecainide (TAMBOCOR) 100 MG tablet Take 100 mg by mouth 2 (two) times daily.   hydrochlorothiazide (HYDRODIURIL) 25 MG tablet Take 1 tablet (25 mg total) by mouth daily.   insulin degludec (TRESIBA FLEXTOUCH) 100 UNIT/ML FlexTouch Pen Inject 46 Units into the skin daily. Titrate as instructed to 50 units daily   levalbuterol (XOPENEX HFA) 45 MCG/ACT inhaler 1-2 puffs tid prn (Patient not taking: Reported on 07/19/2022)   lisinopril (ZESTRIL) 20 MG tablet Take 1 tablet (20 mg total) by mouth daily.   metFORMIN (GLUCOPHAGE-XR) 500 MG 24 hr tablet Take 2 tablets (1,000 mg total) by mouth 2 (two) times daily.   metoprolol tartrate (LOPRESSOR)  25 MG tablet Take 1 tablet (25 mg total) by mouth 2 (two) times daily.   Multiple Vitamin (MULTIVITAMIN) tablet Take 1 tablet by mouth daily.   omeprazole (PRILOSEC) 20 MG capsule TAKE 1 CAPSULE BY MOUTH TWICE DAILY (Patient taking differently: Take 20 mg by mouth daily.)   oxybutynin (DITROPAN) 5 MG tablet Take 1 tablet (5 mg total) by mouth 2 (two) times daily.   Probiotic Product (PROBIOTIC DAILY PO) Take 1 capsule by mouth daily.   psyllium (REGULOID) 0.52 g capsule Take 2 g by mouth daily.   rosuvastatin (CRESTOR) 10 MG tablet Take 1 tablet (10 mg total) by mouth daily.   [DISCONTINUED] Ascorbic Acid (VITAMIN C) 500 MG CAPS Take 500 mg by mouth daily.   No facility-administered encounter medications on file as of 03/05/2023.     Lab Results  Component Value Date   WBC 9.1 02/28/2023   HGB 13.4 02/28/2023   HCT 41.0 02/28/2023   PLT 227.0 02/28/2023   GLUCOSE 117 (H) 02/28/2023   CHOL 130 02/28/2023   TRIG 111.0 02/28/2023   HDL 49.10 02/28/2023   LDLDIRECT 58.0 06/01/2021   LDLCALC 59 02/28/2023   ALT 18 02/28/2023   AST 16 02/28/2023   NA 140 02/28/2023   K 3.9 02/28/2023   CL 104 02/28/2023   CREATININE 0.90 02/28/2023   BUN 31 (H) 02/28/2023   CO2 27 02/28/2023   TSH 0.72 02/28/2023   INR 0.99 11/15/2012   HGBA1C 7.5 (H) 02/28/2023   MICROALBUR <0.7 02/28/2023    MM 3D SCREEN BREAST BILATERAL  Result Date: 08/03/2022 CLINICAL DATA:  Screening. EXAM:  DIGITAL SCREENING BILATERAL MAMMOGRAM WITH TOMOSYNTHESIS AND CAD TECHNIQUE: Bilateral screening digital craniocaudal and mediolateral oblique mammograms were obtained. Bilateral screening digital breast tomosynthesis was performed. The images were evaluated with computer-aided detection. COMPARISON:  Previous exam(s). ACR Breast Density Category b: There are scattered areas of fibroglandular density. FINDINGS: There are no findings suspicious for malignancy. IMPRESSION: No mammographic evidence of malignancy. A result letter  of this screening mammogram will be mailed directly to the patient. RECOMMENDATION: Screening mammogram in one year. (Code:SM-B-01Y) BI-RADS CATEGORY  1: Negative. Electronically Signed   By: Ted Mcalpine M.D.   On: 08/03/2022 16:35       Assessment & Plan:  Type 2 diabetes mellitus with hyperglycemia, without long-term current use of insulin (HCC) Assessment & Plan: On metformin, tresiba and jardiance.  Tolerating jardiance.  Discussed low carb diet and exercise.  Follow met b and a1c.A1c.  Instructed to stay hydrated.  Titrating [name]. A1c improved - 7.5 on recent check.  CGM report as outlined.  Continue diet and exercise.  Follow met b and A1c.   Orders: -     Hemoglobin A1c; Future  Hypercholesteremia Assessment & Plan: On crestor.  Low cholesterol diet and exercise.  Follow lipid panel and liver function tests.   Lab Results  Component Value Date   CHOL 130 02/28/2023   HDL 49.10 02/28/2023   LDLCALC 59 02/28/2023   LDLDIRECT 58.0 06/01/2021   TRIG 111.0 02/28/2023   CHOLHDL 3 02/28/2023     Orders: -     Lipid panel; Future -     Hepatic function panel; Future -     Basic metabolic panel; Future  Atrial fibrillation, unspecified type [facility]) Assessment & Plan: On flecaininde, metoprolol and aspirin.  Appears to be in SR. Stable.  Had f/u with cardiology 01/2023.  Recommended  7 day monitor to assess for afib.  Currently on aspirin daily.  Discussed anticoagulation.  She prefers to continue aspirin daily.  Recommended f/u echo.     Chronic venous insufficiency Assessment & Plan: Has been evaluated.  Lymphedema pump.    Gastroesophageal reflux disease, unspecified whether esophagitis present Assessment & Plan: No acid reflux reported.  Prilosec.    Primary hypertension Assessment & Plan: Continue lisinopril, hctz and metoprolol.  Blood pressure doing well.  Continue current medication.  Follow pressures.    Obstructive sleep apnea Assessment &  Plan: CPAP.       Dale Doolittle, MD

## 2023-03-09 ENCOUNTER — Ambulatory Visit
Admission: EM | Admit: 2023-03-09 | Discharge: 2023-03-09 | Disposition: A | Discharge status: Home / Self Care | Payer: Medicare Other | Attending: Urgent Care | Admitting: Urgent Care

## 2023-03-09 ENCOUNTER — Encounter: Payer: Self-pay | Admitting: Emergency Medicine

## 2023-03-09 DIAGNOSIS — B009 Herpesviral infection, unspecified: Secondary | ICD-10-CM | POA: Diagnosis not present

## 2023-03-09 MED ORDER — VALACYCLOVIR HCL 1 G PO TABS
1000.0000 mg | ORAL_TABLET | Freq: Two times a day (BID) | ORAL | 0 refills | Status: AC
Start: 2023-03-09 — End: 2023-03-19

## 2023-03-09 NOTE — ED Triage Notes (Signed)
States she's not felt well over the last 3 days. Developed a rash over the left side of her face with a left sided headache. Concerned for shingles. States she had her shingles vaccine years ago, but that she has not had it updated recently and was due for it

## 2023-03-09 NOTE — ED Provider Notes (Addendum)
[facility]    CSN: [id number] Arrival date & time: 03/09/23  1500      History   Chief Complaint Chief Complaint  Patient presents with   Facial Pain    I think I might have Shingles. I am not sure. I have blisters on and in my mouth. The gland in my neck is very swelled and I have pain in my face. - Entered by patient   Rash    HPI Christine Robertson is a 67 y.o. female.    Rash   Presents to urgent care with feeling of unwell over the last 3 days.  She reports left side of her face with rash accompanied by left-sided headache.  She denies recent shingles vaccination with Shingrix.  Past Medical History:  Diagnosis Date   Abnormal results of thyroid function studies    Allergy    hay fever   Angina pectoris, unspecified (HCC)    Arthritis    Atrial fibrillation, unspecified    Chicken pox    Cold hands    Congenital anomaly of adrenal gland    Diabetes mellitus    type 2, uncomplicated   Essential hypertension, benign    Family history of adverse reaction to anesthesia    Mother - PONV   GERD (gastroesophageal reflux disease)    IBS (irritable bowel syndrome)    Malaise    Morbid obesity (HCC)    Murmur    Pain in soft tissues of limb    Proteinuria    Pure hypercholesterolemia    Rash and other nonspecific skin eruption    Sleep apnea    Obstructive; Uses C-Pap machine   Urinary incontinence    Vertigo    1 episode, approx 2013   Wears contact lenses    left eye    Patient Active Problem List   Diagnosis Date Noted   Arm pain 12/08/2022   Anemia 03/26/2022   UTI (urinary tract infection) 08/13/2021   Abdominal pain 06/09/2021   Toe infection 06/09/2021   Change in bowel movement 03/07/2021   Low back pain 05/10/2020   Right sided abdominal pain 05/10/2020   Back pain 10/06/2019   Lymphedema 01/23/2018   Chronic venous insufficiency 01/23/2018   Swelling of lower extremity 01/16/2018   SOB (shortness of breath) on exertion 04/27/2017    Hand lesion 08/21/2015   Nasal congestion 08/21/2015   Left breast mass 03/29/2015   Abnormal mammogram of left breast 03/29/2015   Health care maintenance 03/14/2015   Foot pain, bilateral 06/25/2014   Fatigue 01/25/2014   Obstructive sleep apnea 01/25/2014   Rash 08/09/2013   Atrial fibrillation (HCC) 06/24/2013   Microalbuminuria 06/24/2013   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   Low TSH level 12/22/2012   Diabetes mellitus (HCC) 06/06/2012   Hypertension 06/06/2012   Hypercholesteremia 06/06/2012   Urinary incontinence 06/06/2012   GERD (gastroesophageal reflux disease) 06/06/2012    Past Surgical History:  Procedure Laterality Date   ABDOMINAL HYSTERECTOMY  2010   BLADDER SURGERY  1967   stretching of bladder   BREAST BIOPSY Left 03/28/2015   done at Dr. Rutherford Nail office. fibrocystic changes   CATARACT EXTRACTION W/PHACO Right 04/10/2022   Procedure: CATARACT EXTRACTION PHACO AND INTRAOCULAR LENS PLACEMENT (IOC) RIGHT DIABETIC VIVITY LENS;  Surgeon: Galen Manila, MD;  Location: [facility];  Service: Ophthalmology;  Laterality: Right;  6.89 0:40.8   CATARACT EXTRACTION W/PHACO Left 04/24/2022   Procedure: CATARACT EXTRACTION PHACO AND INTRAOCULAR LENS PLACEMENT (  IOC) LEFT DIABETIC VIVITY  toric LENS 5.29 00:41.8;  Surgeon: Galen Manila, MD;  Location: [facility];  Service: Ophthalmology;  Laterality: Left;  Diabetic   COLONOSCOPY     COLONOSCOPY WITH PROPOFOL N/A 09/09/2015   Procedure: COLONOSCOPY WITH PROPOFOL;  Surgeon: Scot Jun, MD;  Location: [facility];  Service: Endoscopy;  Laterality: N/A;   ESOPHAGOGASTRODUODENOSCOPY     TONSILLECTOMY AND ADENOIDECTOMY  1959   URETHRAL DILATION      OB History     Gravida  2   Para      Term      Preterm      AB      Living  2      SAB      IAB      Ectopic      Multiple      Live Births           Obstetric Comments  1st Menstrual Cycle: 10 1st Pregnancy:  20           Home Medications    Prior to Admission medications   Medication Sig Start Date End Date Taking? Authorizing Provider  aspirin 81 MG tablet Take 81 mg by mouth daily.    [provider]  cetirizine (ZYRTEC) 10 MG tablet Take 10 mg by mouth daily.    [provider]  DROPLET PEN NEEDLES 32G X 4 MM MISC USE TO INJECT INSULIN DAILY 07/25/22   Dale [place], MD  empagliflozin (JARDIANCE) 25 MG TABS tablet Take 1 tablet (25 mg total) by mouth daily. 12/31/22   Dale [place], MD  flecainide (TAMBOCOR) 100 MG tablet Take 100 mg by mouth 2 (two) times daily. 11/19/17   [provider]  hydrochlorothiazide (HYDRODIURIL) 25 MG tablet Take 1 tablet (25 mg total) by mouth daily. 12/04/22   Dale [place], MD  insulin degludec (TRESIBA FLEXTOUCH) 100 UNIT/ML FlexTouch Pen Inject 46 Units into the skin daily. Titrate as instructed to 50 units daily 02/25/23   Dale [place], MD  levalbuterol [facility] HFA) 45 MCG/ACT inhaler 1-2 puffs tid prn Patient not taking: Reported on 07/19/2022 12/14/21   Dale [place], MD  lisinopril (ZESTRIL) 20 MG tablet Take 1 tablet (20 mg total) by mouth daily. 12/04/22   Dale [place], MD  metFORMIN (GLUCOPHAGE-XR) 500 MG 24 hr tablet Take 2 tablets (1,000 mg total) by mouth 2 (two) times daily. 12/04/22   Dale [place], MD  metoprolol tartrate (LOPRESSOR) 25 MG tablet Take 1 tablet (25 mg total) by mouth 2 (two) times daily. 12/04/22   Dale [place], MD  Multiple Vitamin (MULTIVITAMIN) tablet Take 1 tablet by mouth daily.    [provider]  omeprazole (PRILOSEC) 20 MG capsule TAKE 1 CAPSULE BY MOUTH TWICE DAILY Patient taking differently: Take 20 mg by mouth daily. 09/07/16   Dale [place], MD  oxybutynin (DITROPAN) 5 MG tablet Take 1 tablet (5 mg total) by mouth 2 (two) times daily. 11/28/22   Dale , MD  Probiotic Product (PROBIOTIC DAILY PO) Take 1 capsule by mouth daily.    [provider]  psyllium (REGULOID)  0.52 g capsule Take 2 g by mouth daily.    [provider]  rosuvastatin (CRESTOR) 10 MG tablet Take 1 tablet (10 mg total) by mouth daily. 07/19/22   Dale , MD    Family History Family History  Problem Relation Age of Onset   Diabetes Mother    Cancer Father  lung   Breast cancer Maternal Grandmother 60   Breast cancer Maternal Aunt 71    Social History Social History   Tobacco Use   Smoking status: Never   Smokeless tobacco: Never  Vaping Use   Vaping Use: Never used  Substance Use Topics   Alcohol use: No    Alcohol/week: 0.0 standard drinks of alcohol   Drug use: No     Allergies   Erythromycin, Augmentin [amoxicillin-pot clavulanate], and Demerol [meperidine]   Review of Systems Review of Systems  Skin:  Positive for rash.     Physical Exam Triage Vital Signs ED Triage Vitals  Enc Vitals Group     BP 03/09/23 1527 (!) 173/78     Pulse Rate 03/09/23 1527 61     Resp 03/09/23 1527 18     Temp 03/09/23 1527 98.9 F (37.2 C)     Temp Source 03/09/23 1527 Oral     SpO2 03/09/23 1527 94 %     Weight --      Height --      Head Circumference --      Peak Flow --      Pain Score 03/09/23 1531 2     Pain Loc --      Pain Edu? --      Excl. in GC? --    No data found.  Updated Vital Signs BP (!) 173/78 (BP Location: Left Wrist)   Pulse 61   Temp 98.9 F (37.2 C) (Oral)   Resp 18   SpO2 94%   Visual Acuity Right Eye Distance:   Left Eye Distance:   Bilateral Distance:    Right Eye Near:   Left Eye Near:    Bilateral Near:     Physical Exam Vitals reviewed.  Constitutional:      Appearance: Normal appearance.  HENT:     Mouth/Throat:     Lips: Lesions present.     Mouth: Oral lesions present.   Skin:    General: Skin is warm and dry.     Findings: Lesion present.  Neurological:     General: No focal deficit present.     Mental Status: She is alert and oriented to person, place, and time.  Psychiatric:         Mood and Affect: Mood normal.        Behavior: Behavior normal.      UC Treatments / Results  Labs (all labs ordered are listed, but only abnormal results are displayed) Labs Reviewed - No data to display  EKG   Radiology No results found.  Procedures Procedures (including critical care time)  Medications Ordered in UC Medications - No data to display  Initial Impression / Assessment and Plan / UC Course  I have reviewed the triage vital signs and the nursing notes.  Pertinent labs & imaging results that were available during my care of the patient were reviewed by me and considered in my medical decision making (see chart for details).   Christine Robertson is a 67 y.o. female presenting with herpetic rash. Patient is afebrile without recent antipyretics, satting well on room air. Overall is well appearing though non-toxic, well hydrated, without respiratory distress.  Multiple herpetic lesions are on the patient's upper lip on the left side of her body as well as above the left lip.  Additional lesions are within her mouth on the hard palate, again left side.  She has lymphadenopathy submental and superficial cervical on  the left side.  Reviewed relevant chart history.   Suspect initial outbreak of herpes like rash.  Will treat with valacyclovir.  Recommended the patient gargle with warm salt water to keep the lesion on the inside of her mouth from developing bacterial infection.  Counseled patient on potential for adverse effects with medications prescribed/recommended today, ER and return-to-clinic precautions discussed, patient verbalized understanding and agreement with care plan.  Final Clinical Impressions(s) / UC Diagnoses   Final diagnoses:  None   Discharge Instructions   None    ED Prescriptions   None    PDMP not reviewed this encounter.   Charma Igo, FNP 03/09/23 1541    Charma Igo, FNP 03/09/23 [phone]

## 2023-03-09 NOTE — Discharge Instructions (Signed)
If your symptoms recur, follow-up with your primary care provider for blood work that can identify the virus causing your symptoms.

## 2023-03-10 ENCOUNTER — Encounter: Payer: Self-pay | Admitting: Internal Medicine

## 2023-03-10 NOTE — Assessment & Plan Note (Addendum)
On flecaininde, metoprolol and aspirin.  Appears to be in SR. Stable.  Had f/u with cardiology 01/2023.  Recommended  7 day monitor to assess for afib.  Currently on aspirin daily.  Discussed anticoagulation.  She prefers to continue aspirin daily.  Recommended f/u echo.

## 2023-03-10 NOTE — Assessment & Plan Note (Signed)
Has been evaluated.  Lymphedema pump.

## 2023-03-10 NOTE — Assessment & Plan Note (Signed)
Continue lisinopril, hctz and metoprolol.  Blood pressure doing well.  Continue current medication.  Follow pressures.  

## 2023-03-10 NOTE — Assessment & Plan Note (Signed)
On metformin, tresiba and jardiance.  Tolerating jardiance.  Discussed low carb diet and exercise.  Follow met b and a1c.A1c.  Instructed to stay hydrated.  Titrating [name]. A1c improved - 7.5 on recent check.  CGM report as outlined.  Continue diet and exercise.  Follow met b and A1c.

## 2023-03-10 NOTE — Assessment & Plan Note (Addendum)
On crestor.  Low cholesterol diet and exercise.  Follow lipid panel and liver function tests.   Lab Results  Component Value Date   CHOL 130 02/28/2023   HDL 49.10 02/28/2023   LDLCALC 59 02/28/2023   LDLDIRECT 58.0 06/01/2021   TRIG 111.0 02/28/2023   CHOLHDL 3 02/28/2023

## 2023-03-10 NOTE — Assessment & Plan Note (Signed)
No acid reflux reported.  Prilosec.  

## 2023-03-10 NOTE — Assessment & Plan Note (Signed)
CPAP.  

## 2023-03-11 ENCOUNTER — Encounter: Payer: Self-pay | Admitting: Internal Medicine

## 2023-03-11 NOTE — Telephone Encounter (Signed)
LM to follow up with patient.

## 2023-03-13 NOTE — Telephone Encounter (Signed)
Given location of rash - ear involvement /facial involvement - needs ENT evaluation.  If agreeable, let me know and I will place order for referral.  Confirm no eye involvement.

## 2023-03-13 NOTE — Telephone Encounter (Signed)
FYI  Called patient again to follow up. She is feeling better today. Still having the pain in her head. Lesions are healing. No fever. She has several days left of her medication so it is improving with each day. Was advised to use tylenol/ibuprofen for the pain- this is helping. She is going to continue her medication as directed and then let us know if she needs anything from [place].

## 2023-03-14 NOTE — Telephone Encounter (Signed)
If only involved inside mouth and no other area, ok to continue to monitor.  Let us know if any problems.

## 2023-03-14 NOTE — Telephone Encounter (Signed)
Patient said the only lesions she had was on her top lip and in roof of her mouth. No lesions in ear. That side of her face just ached. No eye involvement. Pain has improved significantly today. Pt wants to know if it is still necessary for her to go to ENT since there was nothing in her ear.

## 2023-03-19 NOTE — Telephone Encounter (Signed)
FYI-  Patient called in wanting ENT referral because she is still having persistent sore throat, ear ache, pain in her head. When we spoke last week she felt that she was getting better and symptoms had improved some each day. Now she feels like her symptoms have just stayed the same the last couple of days. Advised that she may need to be evaluated sooner because will have to wait on referral to process with ENT and to get appt. Patient agreeable. Advised urgent care today and patient reported that her symptoms have not changed and she did not feel this was necessary. Scheduled for appt with Claris Che tomorrow and gave ED precautions in case symptoms change or worsen.

## 2023-03-19 NOTE — Telephone Encounter (Signed)
Pt called in stating that she still not feeling better and she would like for DR. Scott to go ahead and place that ENT referral for her.

## 2023-03-19 NOTE — Telephone Encounter (Signed)
FYI for you Christine Robertson since you are seeing her tomorrow.

## 2023-03-20 ENCOUNTER — Ambulatory Visit (INDEPENDENT_AMBULATORY_CARE_PROVIDER_SITE_OTHER): Payer: Medicare Other | Admitting: Family

## 2023-03-20 ENCOUNTER — Encounter: Payer: Self-pay | Admitting: Family

## 2023-03-20 VITALS — BP 138/82 | HR 79 | Temp 98.0°F | Ht 63.5 in | Wt 280.4 lb

## 2023-03-20 DIAGNOSIS — Z8249 Family history of ischemic heart disease and other diseases of the circulatory system: Secondary | ICD-10-CM | POA: Diagnosis not present

## 2023-03-20 DIAGNOSIS — R519 Headache, unspecified: Secondary | ICD-10-CM | POA: Insufficient documentation

## 2023-03-20 MED ORDER — DOXYCYCLINE HYCLATE 100 MG PO TABS
100.0000 mg | ORAL_TABLET | Freq: Two times a day (BID) | ORAL | 0 refills | Status: DC
Start: 2023-03-20 — End: 2023-05-14

## 2023-03-20 NOTE — Patient Instructions (Signed)
Question if sinus congestion, early sinus infection.  We will start doxycycline.  Please wear sun protective clothing as doxycycline can make it easier to develop a sunburn.  Ensure to take probiotics while on antibiotics and also for 2 weeks after completion. This can either be by eating yogurt daily or taking a probiotic supplement over the counter such as Culturelle.It is important to re-colonize the gut with good bacteria and also to prevent any diarrheal infections associated with antibiotic use.    If symptoms persist I would recommend neuro and sinus imaging.    Please schedule a follow-up with Dr. Lorin Picket to discuss family history of cerebral aneurysm as discussed today

## 2023-03-20 NOTE — Progress Notes (Signed)
Assessment & Plan:  Facial pain Assessment & Plan: Rash has resolved and question if postherpetic pain versus sinsuitis.  We opted to start with antibiotic therapy based on constellation of symptoms. If symptoms persist , recommend neurologic and sinus imaging and consider trial of nortriptyline for HA prevention .     Orders: -     Doxycycline Hyclate; Take 1 tablet (100 mg total) by mouth 2 (two) times daily.  Dispense: 14 tablet; Refill: 0  Family history of cerebral aneurysm Assessment & Plan:  Brother, 40 yo, cerebral aneurysm. We discussed MRI brain and MRA head for further evaluation. She politely declines at this time and  I advised her to have a dedicated conversation with Dr Lorin Picket in regards to baseline screening.       Return precautions given.   Risks, benefits, and alternatives of the medications and treatment plan prescribed today were discussed, and patient expressed understanding.   Education regarding symptom management and diagnosis given to patient on AVS either electronically or printed.  Return if symptoms worsen or fail to improve.  Rennie Plowman, FNP  Subjective:    Patient ID: Christine Robertson, female    DOB: 07-06-1956, 67 y.o.   MRN: [id number]  CC: Christine Robertson is a 67 y.o. female who presents today for follow up.   HPI: Complains of continued left side sore throat, left ear pain x 11 days, overall improved. She is concerned for sinus infection.  Some nasal congestion is not unusual for her.      She describes a sharp pain on left side of HA, 5-10 times in an hour, over the course of a day.  Teeth are sore.  Dental exam UTD .    No vision loss, fever, vomiting, jaw pain, numbness, pulsatile sensation.    HA is not worse HA of life.   She is compliant with zyrtec. She has taken tylenol 1000mg  and ibuprofen 200mg  with relief.  Lesions have healed on left side of upper lip.She has completed antiviral this morning.   She doesn't want  to see ENT as no lesions in the ear at this time.   Presented to [facility] urgent care 03/09/2023 for herpetic lesions of the face.  Started on valacyclovir  H/o atrial fib  Compliant with Cipap    Allergies: Erythromycin, Augmentin [amoxicillin-pot clavulanate], and Demerol [meperidine] Current Outpatient Medications on File Prior to Visit  Medication Sig Dispense Refill   aspirin 81 MG tablet Take 81 mg by mouth daily.     cetirizine (ZYRTEC) 10 MG tablet Take 10 mg by mouth daily.     DROPLET PEN NEEDLES 32G X 4 MM MISC USE TO INJECT INSULIN DAILY 100 each 3   empagliflozin (JARDIANCE) 25 MG TABS tablet Take 1 tablet (25 mg total) by mouth daily. 90 tablet 1   flecainide (TAMBOCOR) 100 MG tablet Take 100 mg by mouth 2 (two) times daily.     hydrochlorothiazide (HYDRODIURIL) 25 MG tablet Take 1 tablet (25 mg total) by mouth daily. 90 tablet 1   insulin degludec (TRESIBA FLEXTOUCH) 100 UNIT/ML FlexTouch Pen Inject 46 Units into the skin daily. Titrate as instructed to 50 units daily 15 mL 2   lisinopril (ZESTRIL) 20 MG tablet Take 1 tablet (20 mg total) by mouth daily. 90 tablet 1   metFORMIN (GLUCOPHAGE-XR) 500 MG 24 hr tablet Take 2 tablets (1,000 mg total) by mouth 2 (two) times daily. 360 tablet 1   metoprolol tartrate (LOPRESSOR) 25 MG tablet  Take 1 tablet (25 mg total) by mouth 2 (two) times daily. 180 tablet 1   Multiple Vitamin (MULTIVITAMIN) tablet Take 1 tablet by mouth daily.     omeprazole (PRILOSEC) 20 MG capsule TAKE 1 CAPSULE BY MOUTH TWICE DAILY (Patient taking differently: Take 20 mg by mouth daily.) 60 capsule 5   oxybutynin (DITROPAN) 5 MG tablet Take 1 tablet (5 mg total) by mouth 2 (two) times daily. 180 tablet 1   Probiotic Product (PROBIOTIC DAILY PO) Take 1 capsule by mouth daily.     psyllium (REGULOID) 0.52 g capsule Take 2 g by mouth daily.     rosuvastatin (CRESTOR) 10 MG tablet Take 1 tablet (10 mg total) by mouth daily. 90 tablet 3   No current  facility-administered medications on file prior to visit.    Review of Systems  Constitutional:  Negative for chills and fever.  HENT:  Positive for congestion, ear pain, sinus pain and sore throat.   Eyes:  Negative for visual disturbance.  Respiratory:  Negative for cough.   Cardiovascular:  Negative for chest pain and palpitations.  Gastrointestinal:  Negative for nausea and vomiting.  Neurological:  Positive for headaches.      Objective:    BP 138/82   Pulse 79   Temp 98 F (36.7 C) (Oral)   Ht 5' 3.5" (1.613 m)   Wt 280 lb 6.4 oz (127.2 kg)   SpO2 96%   BMI 48.89 kg/m  BP Readings from Last 3 Encounters:  03/20/23 138/82  03/09/23 (!) 173/78  03/05/23 128/70   Wt Readings from Last 3 Encounters:  03/20/23 280 lb 6.4 oz (127.2 kg)  03/05/23 290 lb (131.5 kg)  12/04/22 288 lb (130.6 kg)    Physical Exam Vitals reviewed.  Constitutional:      Appearance: She is well-developed.  HENT:     Head: Normocephalic and atraumatic.     Right Ear: Hearing, tympanic membrane, ear canal and external ear normal. No swelling or tenderness. No middle ear effusion. Tympanic membrane is not erythematous or bulging.     Left Ear: Tympanic membrane, ear canal and external ear normal. No swelling or tenderness.  No middle ear effusion. Tympanic membrane is not erythematous or bulging.     Nose: Nose normal. No rhinorrhea.     Right Sinus: No maxillary sinus tenderness or frontal sinus tenderness.     Left Sinus: No maxillary sinus tenderness or frontal sinus tenderness.     Mouth/Throat:     Pharynx: Uvula midline. No posterior oropharyngeal erythema.  Eyes:     General: Lids are normal. Lids are everted, no foreign bodies appreciated.     Conjunctiva/sclera: Conjunctivae normal.     Pupils: Pupils are equal, round, and reactive to light.     Comments: Normal fundus bilaterally   Cardiovascular:     Rate and Rhythm: Normal rate and regular rhythm.     Pulses: Normal pulses.      Heart sounds: Normal heart sounds.  Pulmonary:     Effort: Pulmonary effort is normal.     Breath sounds: Normal breath sounds. No wheezing, rhonchi or rales.  Lymphadenopathy:     Head:     Right side of head: No submental, submandibular, tonsillar, preauricular, posterior auricular or occipital adenopathy.     Left side of head: No submental, submandibular, tonsillar, preauricular, posterior auricular or occipital adenopathy.     Cervical: No cervical adenopathy.     Right cervical: No superficial, deep or  posterior cervical adenopathy.    Left cervical: No superficial, deep or posterior cervical adenopathy.  Skin:    General: Skin is warm and dry.  Neurological:     Mental Status: She is alert.     Cranial Nerves: No cranial nerve deficit.     Sensory: No sensory deficit.     Deep Tendon Reflexes:     Reflex Scores:      Bicep reflexes are 2+ on the right side and 2+ on the left side.      Patellar reflexes are 2+ on the right side and 2+ on the left side.    Comments: Grip equal and strong bilateral upper extremities. Gait strong and steady. Able to perform  finger-to-nose without difficulty.   Psychiatric:        Speech: Speech normal.        Behavior: Behavior normal.        Thought Content: Thought content normal.

## 2023-03-26 DIAGNOSIS — Z8249 Family history of ischemic heart disease and other diseases of the circulatory system: Secondary | ICD-10-CM | POA: Insufficient documentation

## 2023-03-26 NOTE — Assessment & Plan Note (Addendum)
Rash has resolved and question if postherpetic pain versus sinsuitis.  We opted to start with antibiotic therapy based on constellation of symptoms. If symptoms persist , recommend neurologic and sinus imaging and consider trial of nortriptyline for HA prevention .

## 2023-03-26 NOTE — Assessment & Plan Note (Addendum)
Brother, 69 yo, cerebral aneurysm. We discussed MRI brain and MRA head for further evaluation. She politely declines at this time and  I advised her to have a dedicated conversation with Dr Lorin Picket in regards to baseline screening.

## 2023-03-27 ENCOUNTER — Telehealth: Payer: Self-pay

## 2023-03-27 NOTE — Telephone Encounter (Signed)
Note sent to PCP.  

## 2023-03-27 NOTE — Telephone Encounter (Signed)
Spoke to pt and offered an appt to come in to discuss but pt declined and wants to wait out the rest of the week to see if they are better because they are not as bad and as often as they were. but if she is still having them then she will schedule appt to come in

## 2023-03-27 NOTE — Telephone Encounter (Signed)
noted 

## 2023-04-05 ENCOUNTER — Telehealth: Payer: Self-pay

## 2023-04-05 NOTE — Telephone Encounter (Signed)
Called and LVM in regards to a  cc'd chart arnett sent with the questions below:   Jason Coop Lyn Records, FNP  P [facility] Clinical Call pt How is facial pain, ha on doxycycline ? Is she feeling better?

## 2023-04-05 NOTE — Telephone Encounter (Signed)
Patient called office back. She is feeling better, no facial pain and has finished the doxycycline.

## 2023-04-06 NOTE — Telephone Encounter (Signed)
Noted  

## 2023-04-09 NOTE — Progress Notes (Signed)
Spoke to pt and she stated that she is feeling better if she needs to come in she will contact us

## 2023-04-11 ENCOUNTER — Other Ambulatory Visit: Payer: Self-pay | Admitting: Pharmacist

## 2023-04-11 ENCOUNTER — Encounter: Payer: Self-pay | Admitting: Pharmacist

## 2023-04-11 NOTE — Progress Notes (Signed)
Care Coordination Call  Received message from patient that she has hit the coverage gap and cannot afford Jardiance. She notes that her husband recently lost his job. Current income does meet requirement for Jardiance patient assistance. Will collaborate with technician team to apply for assistance.   Catie Eppie Gibson, PharmD, BCACP, CPP Clinical Pharmacist [facility] [phone]

## 2023-04-15 ENCOUNTER — Other Ambulatory Visit: Payer: Medicare Other | Admitting: Pharmacist

## 2023-04-16 ENCOUNTER — Telehealth: Payer: Self-pay | Admitting: Pharmacist

## 2023-04-16 ENCOUNTER — Other Ambulatory Visit (HOSPITAL_COMMUNITY): Payer: Self-pay

## 2023-04-16 NOTE — Progress Notes (Signed)
Medication Samples have been provided to the patient.  Drug name: Jardiance       Strength: 10 mg        Qty: 4 boxes of 7  LOT: [id number]  Exp.Date: 11/25  Dosing instructions: Take 1 tablet by mouth daily.   Patient is prescribed 25 mg daily, but insufficient sample supply here at clinic. Will sample with 10 mg while working on 25 mg assistance.   The patient has been instructed regarding the correct time, dose, and frequency of taking this medication, including desired effects and most common side effects.   Christine Robertson 4:01 PM 04/16/2023

## 2023-05-14 ENCOUNTER — Other Ambulatory Visit: Payer: Medicare Other | Admitting: Pharmacist

## 2023-05-14 NOTE — Progress Notes (Signed)
05/14/2023 Name: Christine Robertson MRN: [id number] DOB: 1955-11-03  Chief Complaint  Patient presents with   Diabetes   Medication Management    TAKIERA Robertson is a 67 y.o. year old female who presented for a telephone visit.   They were referred to the pharmacist by their PCP for assistance in managing diabetes.   Pertinent PMH: HTN, AF, OSA, DM2, GERD, Hx UTI, HLD  Subjective:  Care Team: Primary Care Provider: Dale Davie, MD ; Next Scheduled Visit: 07/11/23  Medication Access/Adherence  Current Pharmacy:  [facility] [id number] [place], [place] - [address] [place] [address] Phone: [phone] Fax: [phone]   Patient reports affordability concerns with their medications: Yes  Has MAP application for Jardiance at home though has not completed. Husband in the process of starting a new job soon, so she did not complete the application.  Daughter picked up 90 day supply of Jardiance 25 mg tablet [place].   Patient reports access/transportation concerns to their pharmacy: No  Patient reports adherence concerns with their medications:  No    Diabetes:  Current medications:  Tresiba 46 units daily (takes in the morning) Metformin XR 1000 mg twice daily Jardiance 25 mg daily  Medications tried in past: GLP1: Stomach upset on higher doses of Ozempic - Noted by GI that she could re-try incretin therapy. Previously over income limit for GLP1 MAP.   SMBG: Patient has not using Libre over the past month. Christine Robertson has been covered through insurance without a copay and she confirms supply of sensors at home. Denies specific reason for not wearing sensor reporting that she found it very useful. Is open to starting regular CGM use again.   Patient denies hypoglycemic s/sx including dizziness, shakiness, sweating.  Patient denies hyperglycemic symptoms including polyuria, polydipsia, polyphagia, nocturia, neuropathy,  blurred vision.  Diet: Current meal patterns: plans to cut back to whole wheat bread  Breakfast: Eggs, oatmeal or 1 slice whole wheat toast Lunch:  Sandwich Dinner: Fast food ~3 times week. Meat (hamburger, chick), vegetable (green beans, frozen mix), sometimes potatoes Beverages: Diet coke and water  Current physical activity:  Walks around house with walker, as the cement flooring hurts her knees. Otherwise, ambulates without assistance device.    Objective:  Lab Results  Component Value Date   HGBA1C 7.5 (H) 02/28/2023   Lab Results  Component Value Date   CREATININE 0.90 02/28/2023   BUN 31 (H) 02/28/2023   NA 140 02/28/2023   K 3.9 02/28/2023   CL 104 02/28/2023   CO2 27 02/28/2023   Lab Results  Component Value Date   CHOL 130 02/28/2023   HDL 49.10 02/28/2023   LDLCALC 59 02/28/2023   LDLDIRECT 58.0 06/01/2021   TRIG 111.0 02/28/2023   CHOLHDL 3 02/28/2023   Medications Reviewed Today   Medications were not reviewed in this encounter    Assessment/Plan:   Diabetes: Uncontrolled per A1c 7.5% (02/28/23) though improved from previous, 8.3% (11/2022). Unfortunately, patient has not tracked sugars over the past ~month or so. Denies cost or access concerns with CGM and confirms supply of Libre sensors at home. Denies s/sx hypo/hyperglycemia. Given no BG data today, medication change not warranted.  Continue Tresiba 46 units daily Continue metformin XR 1000 mg twice daily Continue Jardiance 25 mg daily Discussed increasing physical activity (such as gym membership or classes). Consider chair exercise or pool workouts if joints ache with walking.  Diet: Goal to focus on minimizing fast food Patient agreeable to reach out prior to next visit should she see sugars are persistently elevated on CGM. Patient agreeable to start tracking sugars again. Plans to place [place] sensor today.  Future Consideration: GLP1-RA: Previously did not qualify for Ozempic MAP due to  exceeding income requirement. Husband currently unemployed though she believes he will start new job very soon. Continue to reassess income for possible MAP eligibility.  Sulfonylurea: If continued significant post-dinner hyperglycemia, may consider glipizide. Previously discussed downsides of glipizide including hypoglycemia and risk of weight gain and why we would prefer to target nutritional and lifestyle choices first.  ASCVD Risk-Reduction: Lipids well controlled with LDL 59 mg/dL (02/4402) which is below goal of <70 mg/dL on rosuvastatin 10 mg daily. Blood pressure in clinic variable, though lowest readings typically <130/80 mmHg. Has BP cuff.  Check BP at home occasionally in the morning with home cuff (not validated in clinic). Continue all medications as prescribed, no changes warranted.   Follow Up Plan:  PCP as scheduled 07/11/23 (~8 weeks) PharmD phone visit in ~ 4 weeks to review Ong data and adjust diabetes medications accordingly as needed  Christine Robertson, PharmD Clinical Pharmacist [facility] [phone]

## 2023-05-14 NOTE — Patient Instructions (Addendum)
[name],   It was a pleasure to speak with you today! As we discussed:?  For now, please continue all medications as you have been taking them.  Continue to keep up the hard work surrounding diet and exercise! We discussed minimizing fast food as much as possible.  It looks like Medicare offers support with gym membership only to specific Medicare plans. I am not sure if we have your most updated Medicare card on file, though the website below has a tool to check eligibility.  https://tools.silversneakers.com/Eligibility/CheckEligibility There are also countless exercises that can be done in the home by searching for 'weight bearing' or 'chair' exercises on the internet. Please place a new Libre Sensor to resume tracking your blood sugars. We will review the data together over the phone next month, as discussed. Please reach out if you have any issues with your [place] sensor.  If your blood sugar readings are persistently elevated, please reach out prior to our next phone visit and we can connect sooner to discuss medication changes before then.  Follow up with Dr. Lorin Picket is scheduled for 07/11/23  Follow up with clinical pharmacist via telephone visit on 06/11/23 at 10:00 am.     Berenice Primas, PharmD - Clinical Pharmacist

## 2023-05-31 ENCOUNTER — Other Ambulatory Visit: Payer: Self-pay | Admitting: Internal Medicine

## 2023-06-06 ENCOUNTER — Other Ambulatory Visit: Payer: Self-pay | Admitting: Internal Medicine

## 2023-06-06 DIAGNOSIS — E1165 Type 2 diabetes mellitus with hyperglycemia: Secondary | ICD-10-CM

## 2023-06-11 ENCOUNTER — Other Ambulatory Visit: Payer: Medicare Other

## 2023-06-13 ENCOUNTER — Other Ambulatory Visit: Payer: Self-pay | Admitting: Internal Medicine

## 2023-07-02 ENCOUNTER — Other Ambulatory Visit: Payer: Medicare Other

## 2023-07-09 ENCOUNTER — Other Ambulatory Visit (INDEPENDENT_AMBULATORY_CARE_PROVIDER_SITE_OTHER): Payer: Medicare Other

## 2023-07-09 DIAGNOSIS — Z7985 Long-term (current) use of injectable non-insulin antidiabetic drugs: Secondary | ICD-10-CM | POA: Diagnosis not present

## 2023-07-09 DIAGNOSIS — Z7984 Long term (current) use of oral hypoglycemic drugs: Secondary | ICD-10-CM

## 2023-07-09 DIAGNOSIS — E7800 Pure hypercholesterolemia, unspecified: Secondary | ICD-10-CM | POA: Diagnosis not present

## 2023-07-09 DIAGNOSIS — E1165 Type 2 diabetes mellitus with hyperglycemia: Secondary | ICD-10-CM | POA: Diagnosis not present

## 2023-07-09 LAB — HEMOGLOBIN A1C: Hgb A1c MFr Bld: 7.4 % — ABNORMAL HIGH (ref 4.6–6.5)

## 2023-07-09 LAB — LIPID PANEL
Cholesterol: 133 mg/dL (ref 0–200)
HDL: 50.9 mg/dL (ref >39.00)
LDL Cholesterol: 57 mg/dL (ref 0–99)
NonHDL: 81.67
Total CHOL/HDL Ratio: 3
Triglycerides: 121 mg/dL (ref 0.0–149.0)
VLDL: 24.2 mg/dL (ref 0.0–40.0)

## 2023-07-09 LAB — BASIC METABOLIC PANEL
BUN: 23 mg/dL (ref 6–23)
CO2: 29 meq/L (ref 19–32)
Calcium: 9.9 mg/dL (ref 8.4–10.5)
Chloride: 103 meq/L (ref 96–112)
Creatinine, Ser: 0.96 mg/dL (ref 0.40–1.20)
GFR: 60.48 mL/min (ref >60.00)
Glucose, Bld: 161 mg/dL — ABNORMAL HIGH (ref 70–99)
Potassium: 4.3 meq/L (ref 3.5–5.1)
Sodium: 140 meq/L (ref 135–145)

## 2023-07-09 LAB — HEPATIC FUNCTION PANEL
ALT: 20 U/L (ref 0–35)
AST: 19 U/L (ref 0–37)
Albumin: 4.2 g/dL (ref 3.5–5.2)
Alkaline Phosphatase: 54 U/L (ref 39–117)
Bilirubin, Direct: 0.1 mg/dL (ref 0.0–0.3)
Total Bilirubin: 0.5 mg/dL (ref 0.2–1.2)
Total Protein: 7.1 g/dL (ref 6.0–8.3)

## 2023-07-11 ENCOUNTER — Ambulatory Visit (INDEPENDENT_AMBULATORY_CARE_PROVIDER_SITE_OTHER): Payer: Medicare Other | Admitting: Internal Medicine

## 2023-07-11 ENCOUNTER — Encounter: Payer: Self-pay | Admitting: Internal Medicine

## 2023-07-11 VITALS — BP 130/74 | HR 71 | Temp 97.9°F | Resp 16 | Ht 63.5 in | Wt 283.8 lb

## 2023-07-11 DIAGNOSIS — E7800 Pure hypercholesterolemia, unspecified: Secondary | ICD-10-CM | POA: Diagnosis not present

## 2023-07-11 DIAGNOSIS — E1165 Type 2 diabetes mellitus with hyperglycemia: Secondary | ICD-10-CM

## 2023-07-11 DIAGNOSIS — Z0000 Encounter for general adult medical examination without abnormal findings: Secondary | ICD-10-CM | POA: Diagnosis not present

## 2023-07-11 DIAGNOSIS — I4891 Unspecified atrial fibrillation: Secondary | ICD-10-CM

## 2023-07-11 DIAGNOSIS — Z23 Encounter for immunization: Secondary | ICD-10-CM | POA: Diagnosis not present

## 2023-07-11 DIAGNOSIS — I10 Essential (primary) hypertension: Secondary | ICD-10-CM

## 2023-07-11 DIAGNOSIS — G4733 Obstructive sleep apnea (adult) (pediatric): Secondary | ICD-10-CM

## 2023-07-11 DIAGNOSIS — L989 Disorder of the skin and subcutaneous tissue, unspecified: Secondary | ICD-10-CM

## 2023-07-11 DIAGNOSIS — K219 Gastro-esophageal reflux disease without esophagitis: Secondary | ICD-10-CM

## 2023-07-11 DIAGNOSIS — I890 Lymphedema, not elsewhere classified: Secondary | ICD-10-CM

## 2023-07-11 DIAGNOSIS — Z7984 Long term (current) use of oral hypoglycemic drugs: Secondary | ICD-10-CM

## 2023-07-11 MED ORDER — EMPAGLIFLOZIN 25 MG PO TABS: 25.0000 mg | ORAL_TABLET | Freq: Every day | ORAL | 1 refills | Status: AC

## 2023-07-11 MED ORDER — ROSUVASTATIN CALCIUM 10 MG PO TABS: 10.0000 mg | ORAL_TABLET | Freq: Every day | ORAL | 3 refills | Status: DC

## 2023-07-11 MED ORDER — DROPLET PEN NEEDLES 32G X 4 MM MISC: 3 refills | Status: DC

## 2023-07-11 MED ORDER — HYDROCHLOROTHIAZIDE 25 MG PO TABS: 25.0000 mg | ORAL_TABLET | Freq: Every day | ORAL | 1 refills | Status: DC

## 2023-07-11 MED ORDER — LISINOPRIL 20 MG PO TABS: 20.0000 mg | ORAL_TABLET | Freq: Every day | ORAL | 1 refills | Status: DC

## 2023-07-11 MED ORDER — METFORMIN HCL ER 500 MG PO TB24: 1000.0000 mg | ORAL_TABLET | Freq: Two times a day (BID) | ORAL | 1 refills | Status: DC

## 2023-07-11 NOTE — Assessment & Plan Note (Addendum)
Physical today 07/11/23.  Colonoscopy 09/2015 - recommended f/u in 5 years.  Overdue.  Mammogram 08/02/22- Birads I.

## 2023-07-11 NOTE — Progress Notes (Signed)
Subjective:    Patient ID: Christine Robertson, female    DOB: Aug 02, 1956, 67 y.o.   MRN: [id number]  Patient here for  Chief Complaint  Patient presents with   Annual Exam    HPI With past history of diabetes, paroxysmal afib - maintained on metoprolol and flecainide, hypercholesterolemia and OSA - comes in today to follow up on these issues as well as for a complete physical exam. Not checking sugars regularly.  States sensors fall off.  Reviewed CGM from 06/28/23 - 07/11/23 - only 17% active.  Target range 88%, high 12%.  Taking tresiba 46 units in am. Taking jardiance and metformin. Discussed diet and exercise and the need for regularly checking her sugars. No chest pain.  Breathing stable. Saw GI 05/16/23 - planning for colonoscopy.  Saw cardiology 04/22/23 - normal echo and 7 day monitor 02/2023. Continues on metoprolol and flecainide. Persistent facial lesion - present for a couple of years.  Request referral to dermatology.    Past Medical History:  Diagnosis Date   Abnormal results of thyroid function studies    Allergy    hay fever   Angina pectoris, unspecified (HCC)    Arthritis    Atrial fibrillation, unspecified    Chicken pox    Cold hands    Congenital anomaly of adrenal gland    Diabetes mellitus    type 2, uncomplicated   Essential hypertension, benign    Family history of adverse reaction to anesthesia    Mother - PONV   GERD (gastroesophageal reflux disease)    IBS (irritable bowel syndrome)    Malaise    Morbid obesity (HCC)    Murmur    Pain in soft tissues of limb    Proteinuria    Pure hypercholesterolemia    Rash and other nonspecific skin eruption    Sleep apnea    Obstructive; Uses C-Pap machine   Urinary incontinence    Vertigo    1 episode, approx 2013   Wears contact lenses    left eye   Past Surgical History:  Procedure Laterality Date   ABDOMINAL HYSTERECTOMY  2010   BLADDER SURGERY  1967   stretching of bladder   BREAST BIOPSY Left  03/28/2015   done at Dr. Rutherford Nail office. fibrocystic changes   CATARACT EXTRACTION W/PHACO Right 04/10/2022   Procedure: CATARACT EXTRACTION PHACO AND INTRAOCULAR LENS PLACEMENT (IOC) RIGHT DIABETIC VIVITY LENS;  Surgeon: Galen Manila, MD;  Location: [facility];  Service: Ophthalmology;  Laterality: Right;  6.89 0:40.8   CATARACT EXTRACTION W/PHACO Left 04/24/2022   Procedure: CATARACT EXTRACTION PHACO AND INTRAOCULAR LENS PLACEMENT (IOC) LEFT DIABETIC VIVITY  toric LENS 5.29 00:41.8;  Surgeon: Galen Manila, MD;  Location: [facility];  Service: Ophthalmology;  Laterality: Left;  Diabetic   COLONOSCOPY     COLONOSCOPY WITH PROPOFOL N/A 09/09/2015   Procedure: COLONOSCOPY WITH PROPOFOL;  Surgeon: Scot Jun, MD;  Location: [facility];  Service: Endoscopy;  Laterality: N/A;   ESOPHAGOGASTRODUODENOSCOPY     TONSILLECTOMY AND ADENOIDECTOMY  1959   URETHRAL DILATION     Family History  Problem Relation Age of Onset   Diabetes Mother    Cancer Father        lung   Cerebral aneurysm Brother 42       s/p rupture   Breast cancer Maternal Grandmother 70   Breast cancer Maternal Aunt 56   Social History   Socioeconomic History   Marital status: Married  Spouse name: Not on file   Number of children: Not on file   Years of education: Not on file   Highest education level: 12th grade  Occupational History   Not on file  Tobacco Use   Smoking status: Never   Smokeless tobacco: Never  Vaping Use   Vaping status: Never Used  Substance and Sexual Activity   Alcohol use: No    Alcohol/week: 0.0 standard drinks of alcohol   Drug use: No   Sexual activity: Not on file  Other Topics Concern   Not on file  Social History Narrative   Not on file   Social Determinants of Health   Financial Resource Strain: Low Risk  (07/07/2023)   Overall Financial Resource Strain (CARDIA)    Difficulty of Paying Living Expenses: Not very hard  Food Insecurity: No Food  Insecurity (07/07/2023)   Hunger Vital Sign    Worried About Running Out of Food in the Last Year: Never true    Ran Out of Food in the Last Year: Never true  Transportation Needs: No Transportation Needs (07/07/2023)   PRAPARE - Administrator, Civil Service (Medical): No    Lack of Transportation (Non-Medical): No  Physical Activity: Inactive (07/07/2023)   Exercise Vital Sign    Days of Exercise per Week: 0 days    Minutes of Exercise per Session: 0 min  Stress: No Stress Concern Present (07/07/2023)   [employer] of Occupational Health - Occupational Stress Questionnaire    Feeling of Stress : Not at all  Social Connections: Socially Integrated (07/07/2023)   Social Connection and Isolation Panel [NHANES]    Frequency of Communication with Friends and Family: More than three times a week    Frequency of Social Gatherings with Friends and Family: Three times a week    Attends Religious Services: More than 4 times per year    Active Member of Clubs or Organizations: Yes    Attends Banker Meetings: More than 4 times per year    Marital Status: Married     Review of Systems  Constitutional:  Negative for appetite change and unexpected weight change.  HENT:  Negative for congestion, sinus pressure and sore throat.   Eyes:  Negative for pain and visual disturbance.  Respiratory:  Negative for cough and chest tightness.        Breathing stable.   Cardiovascular:  Negative for chest pain and palpitations.       Chronic stable lower extremity swelling.   Gastrointestinal:  Negative for abdominal pain, diarrhea, nausea and vomiting.  Genitourinary:  Negative for difficulty urinating and dysuria.  Musculoskeletal:  Negative for joint swelling and myalgias.  Skin:  Negative for color change and rash.  Neurological:  Negative for dizziness and headaches.  Hematological:  Negative for adenopathy. Does not bruise/bleed easily.  Psychiatric/Behavioral:  Negative  for decreased concentration and dysphoric mood.        Objective:     BP 130/74   Pulse 71   Temp 97.9 F (36.6 C)   Resp 16   Ht 5' 3.5" (1.613 m)   Wt 283 lb 12.8 oz (128.7 kg)   SpO2 98%   BMI 49.48 kg/m  Wt Readings from Last 3 Encounters:  07/11/23 283 lb 12.8 oz (128.7 kg)  03/20/23 280 lb 6.4 oz (127.2 kg)  03/05/23 290 lb (131.5 kg)    Physical Exam Vitals reviewed.  Constitutional:      General: She is  not in acute distress.    Appearance: Normal appearance. She is well-developed.  HENT:     Head: Normocephalic and atraumatic.     Right Ear: External ear normal.     Left Ear: External ear normal.  Eyes:     General: No scleral icterus.       Right eye: No discharge.        Left eye: No discharge.     Conjunctiva/sclera: Conjunctivae normal.  Neck:     Thyroid: No thyromegaly.  Cardiovascular:     Rate and Rhythm: Normal rate and regular rhythm.  Pulmonary:     Effort: No tachypnea, accessory muscle usage or respiratory distress.     Breath sounds: Normal breath sounds. No decreased breath sounds or wheezing.  Chest:  Breasts:    Right: No inverted nipple, mass, nipple discharge or tenderness (no axillary adenopathy).     Left: No inverted nipple, mass, nipple discharge or tenderness (no axilarry adenopathy).  Abdominal:     General: Bowel sounds are normal.     Palpations: Abdomen is soft.     Tenderness: There is no abdominal tenderness.  Musculoskeletal:        General: No tenderness.     Cervical back: Neck supple.     Comments: Chronic - lymphedema/lower extremity swelling.    Lymphadenopathy:     Cervical: No cervical adenopathy.  Skin:    Findings: No erythema or rash.  Neurological:     Mental Status: She is alert and oriented to person, place, and time.  Psychiatric:        Mood and Affect: Mood normal.        Behavior: Behavior normal.      Outpatient Encounter Medications as of 07/11/2023  Medication Sig   aspirin 81 MG tablet  Take 81 mg by mouth daily.   cetirizine (ZYRTEC) 10 MG tablet Take 10 mg by mouth daily.   empagliflozin (JARDIANCE) 25 MG TABS tablet Take 1 tablet (25 mg total) by mouth daily.   flecainide (TAMBOCOR) 100 MG tablet Take 100 mg by mouth 2 (two) times daily.   hydrochlorothiazide (HYDRODIURIL) 25 MG tablet Take 1 tablet (25 mg total) by mouth daily.   Insulin Pen Needle (DROPLET PEN NEEDLES) 32G X 4 MM MISC USE TO INJECT INSULIN DAILY   lisinopril (ZESTRIL) 20 MG tablet Take 1 tablet (20 mg total) by mouth daily.   metFORMIN (GLUCOPHAGE-XR) 500 MG 24 hr tablet Take 2 tablets (1,000 mg total) by mouth 2 (two) times daily.   methylcellulose oral powder Take by mouth daily.   metoprolol tartrate (LOPRESSOR) 25 MG tablet TAKE 1 TABLET BY MOUTH 2 TIMES A DAY   Multiple Vitamin (MULTIVITAMIN) tablet Take 1 tablet by mouth daily.   omeprazole (PRILOSEC) 20 MG capsule TAKE 1 CAPSULE BY MOUTH TWICE DAILY (Patient taking differently: Take 20 mg by mouth daily.)   oxybutynin (DITROPAN) 5 MG tablet TAKE 1 TABLET BY MOUTH 2 TIMES A DAY   rosuvastatin (CRESTOR) 10 MG tablet Take 1 tablet (10 mg total) by mouth daily.   TRESIBA FLEXTOUCH 100 UNIT/ML FlexTouch Pen INJECT 38 UNITS INTO THE SKIN DAILY. TITRATE AS INSTRUCTED TO 50 UNITS DAILY   [DISCONTINUED] DROPLET PEN NEEDLES 32G X 4 MM MISC USE TO INJECT INSULIN DAILY   [DISCONTINUED] empagliflozin (JARDIANCE) 25 MG TABS tablet Take 1 tablet (25 mg total) by mouth daily.   [DISCONTINUED] hydrochlorothiazide (HYDRODIURIL) 25 MG tablet Take 1 tablet (25 mg total) by mouth daily.   [  DISCONTINUED] lisinopril (ZESTRIL) 20 MG tablet Take 1 tablet (20 mg total) by mouth daily.   [DISCONTINUED] metFORMIN (GLUCOPHAGE-XR) 500 MG 24 hr tablet Take 2 tablets (1,000 mg total) by mouth 2 (two) times daily.   [DISCONTINUED] rosuvastatin (CRESTOR) 10 MG tablet Take 1 tablet (10 mg total) by mouth daily.   No facility-administered encounter medications on file as of  07/11/2023.     Lab Results  Component Value Date   WBC 9.1 02/28/2023   HGB 13.4 02/28/2023   HCT 41.0 02/28/2023   PLT 227.0 02/28/2023   GLUCOSE 161 (H) 07/09/2023   CHOL 133 07/09/2023   TRIG 121.0 07/09/2023   HDL 50.90 07/09/2023   LDLDIRECT 58.0 06/01/2021   LDLCALC 57 07/09/2023   ALT 20 07/09/2023   AST 19 07/09/2023   NA 140 07/09/2023   K 4.3 07/09/2023   CL 103 07/09/2023   CREATININE 0.96 07/09/2023   BUN 23 07/09/2023   CO2 29 07/09/2023   TSH 0.72 02/28/2023   INR 0.99 11/15/2012   HGBA1C 7.4 (H) 07/09/2023   MICROALBUR <0.7 02/28/2023       Assessment & Plan:  Routine general medical examination at a health care facility  Type 2 diabetes mellitus with hyperglycemia, without long-term current use of insulin (HCC) Assessment & Plan: On metformin, tresiba and jardiance.  Tolerating jardiance.  Discussed low carb diet and exercise.  Follow met b and a1c.A1c.  Instructed to stay hydrated.   CGM report as outlined.  Continue diet and exercise.  Discussed the need to check sugars more regularly.  Need more data to review, to determine what medication adjustment are needed.   Orders: -     Hemoglobin A1c; Future -     Droplet Pen Needles; USE TO INJECT INSULIN DAILY  Dispense: 100 each; Refill: 3 -     metFORMIN HCl ER; Take 2 tablets (1,000 mg total) by mouth 2 (two) times daily.  Dispense: 360 tablet; Refill: 1  Hypercholesteremia Assessment & Plan: On crestor.  Low cholesterol diet and exercise.  Follow lipid panel and liver function tests.   Lab Results  Component Value Date   CHOL 133 07/09/2023   HDL 50.90 07/09/2023   LDLCALC 57 07/09/2023   LDLDIRECT 58.0 06/01/2021   TRIG 121.0 07/09/2023   CHOLHDL 3 07/09/2023     Orders: -     Lipid panel; Future -     Hepatic function panel; Future -     Basic metabolic panel; Future  Need for influenza vaccination -     Flu Vaccine Trivalent High Dose (Fluad)  Health care maintenance Assessment &  Plan: Physical today 07/11/23.  Colonoscopy 09/2015 - recommended f/u in 5 years.  Overdue.  Mammogram 08/02/22- [facility] I.     Atrial fibrillation, unspecified type [facility]) Assessment & Plan: On flecaininde, metoprolol and aspirin.  Saw cardiology 04/22/23 - normal echo and 7 day monitor 02/2023. Have discussed anticoagulation.  She prefers to continue aspirin daily.     Gastroesophageal reflux disease, unspecified whether esophagitis present Assessment & Plan: No acid reflux reported.  Prilosec.    Primary hypertension Assessment & Plan: Continue lisinopril, hctz and metoprolol.  Blood pressure doing well.  Continue current medication.  Follow pressures.    Lymphedema Assessment & Plan: Continue lymphedema pump.    Obstructive sleep apnea Assessment & Plan: CPAP.    Facial lesion Assessment & Plan: Persistent facial lesion.  Refer to dermatology.   Orders: -     Ambulatory  referral to Dermatology  Other orders -     Empagliflozin; Take 1 tablet (25 mg total) by mouth daily.  Dispense: 90 tablet; Refill: 1 -     hydroCHLOROthiazide; Take 1 tablet (25 mg total) by mouth daily.  Dispense: 90 tablet; Refill: 1 -     Lisinopril; Take 1 tablet (20 mg total) by mouth daily.  Dispense: 90 tablet; Refill: 1 -     Rosuvastatin Calcium; Take 1 tablet (10 mg total) by mouth daily.  Dispense: 90 tablet; Refill: 3     Dale [place], MD

## 2023-07-13 ENCOUNTER — Encounter: Payer: Self-pay | Admitting: Internal Medicine

## 2023-07-13 DIAGNOSIS — L989 Disorder of the skin and subcutaneous tissue, unspecified: Secondary | ICD-10-CM | POA: Insufficient documentation

## 2023-07-13 NOTE — Assessment & Plan Note (Signed)
On metformin, tresiba and jardiance.  Tolerating jardiance.  Discussed low carb diet and exercise.  Follow met b and a1c.A1c.  Instructed to stay hydrated.   CGM report as outlined.  Continue diet and exercise.  Discussed the need to check sugars more regularly.  Need more data to review, to determine what medication adjustment are needed.

## 2023-07-13 NOTE — Assessment & Plan Note (Signed)
Persistent facial lesion.  Refer to dermatology.

## 2023-07-13 NOTE — Assessment & Plan Note (Signed)
No acid reflux reported.  Prilosec.  

## 2023-07-13 NOTE — Assessment & Plan Note (Signed)
CPAP.  

## 2023-07-13 NOTE — Assessment & Plan Note (Signed)
Continue lymphedema pump.  

## 2023-07-13 NOTE — Assessment & Plan Note (Signed)
Continue lisinopril, hctz and metoprolol.  Blood pressure doing well.  Continue current medication.  Follow pressures.  

## 2023-07-13 NOTE — Assessment & Plan Note (Addendum)
On flecaininde, metoprolol and aspirin.  Saw cardiology 04/22/23 - normal echo and 7 day monitor 02/2023. Have discussed anticoagulation.  She prefers to continue aspirin daily.

## 2023-07-13 NOTE — Assessment & Plan Note (Signed)
On crestor.  Low cholesterol diet and exercise.  Follow lipid panel and liver function tests.   Lab Results  Component Value Date   CHOL 133 07/09/2023   HDL 50.90 07/09/2023   LDLCALC 57 07/09/2023   LDLDIRECT 58.0 06/01/2021   TRIG 121.0 07/09/2023   CHOLHDL 3 07/09/2023

## 2023-07-15 ENCOUNTER — Other Ambulatory Visit: Payer: Medicare Other | Admitting: Pharmacist

## 2023-07-15 DIAGNOSIS — E119 Type 2 diabetes mellitus without complications: Secondary | ICD-10-CM

## 2023-07-15 DIAGNOSIS — Z7984 Long term (current) use of oral hypoglycemic drugs: Secondary | ICD-10-CM

## 2023-07-15 DIAGNOSIS — Z794 Long term (current) use of insulin: Secondary | ICD-10-CM

## 2023-07-15 NOTE — Patient Instructions (Signed)
[name],   It was a pleasure to speak with you today! As we discussed:?   Your blood sugar since placing your new sensor last week has been perfect! Great work.  Please continue all medications as you have been taking them.  Continue to keep up the hard work surrounding diet and exercise!  Please reach out if you note any symptoms of low blood sugar, or if your blood sugar readings are persistently elevated and we can discuss small tweaking of your medication doses as needed.  Future Appointments  Date Time Provider Department Center  09/16/2023  3:00 PM [facility] PHARMACIST [facility]  09/25/2023  3:30 PM Elie Goody, MD [facility] None  10/16/2023  3:00 PM [facility] ANNUAL WELLNESS VISIT [facility]  10/29/2023 10:30 AM [facility] LAB [facility]  10/31/2023  3:30 PM Dale Stovall, MD [facility]   Berenice Primas, PharmD - Clinical Pharmacist

## 2023-07-15 NOTE — Progress Notes (Signed)
07/15/2023 Name: Christine Robertson MRN: [id number] DOB: [birth year]  Chief Complaint  Patient presents with   Diabetes   Christine Robertson is a 67 y.o. year old female who presented for a telephone visit.   They were referred to the pharmacist by their PCP for assistance in managing diabetes.   Pertinent PMH: HTN, AF, OSA, DM2, GERD, Hx UTI, HLD  Subjective:  Care Team: Primary Care Provider: Dale [place], MD  Medication Access/Adherence Current Pharmacy:  [facility] [id number] [place], [place] - [address] [place] [address] Phone: [phone] Fax: [phone]  Patient reports affordability concerns with their medications: Yes  Has MAP application for Jardiance at home though has not completed. Husband in the process of starting a new job soon, so she did not complete the application.  Daughter picked up 90 day supply of Jardiance 25 mg tablet [place]over [place].   Patient reports access/transportation concerns to their pharmacy: No  Patient reports adherence concerns with their medications:  No    Diabetes: Since last visit, patient reports doing well overall. Has intermittently work her [place] sensor. She notes that she notices sugars are significantly bettern controlled when she has on her sensor, as she is more mindful of glycemic control/dietary excursion impact on BG.   Denies need for refills. All mediations remain affordable at this time. Josephine Igo remains covered.   Current medications:  Tresiba 46 units daily (takes in the morning) Metformin XR 1000 mg twice daily Jardiance 25 mg daily  Medications tried in past: GLP1: Stomach upset on higher doses of Ozempic - Noted by GI that she could re-try incretin therapy. Previously over income limit for GLP1 MAP.   SMBG: Patient has not using Libre continuously, though placed a new sensor ~4 days ago. 4-day summary below:    Patient denies hypoglycemic s/sx including  dizziness, shakiness, sweating.  Patient denies hyperglycemic symptoms including polyuria, polydipsia, polyphagia, nocturia, neuropathy, blurred vision.  Diet:  Current meal patterns: Since last visit, notes working hard to eat dinner earlier which she feels has positively impacted her sugar. She reports her largest issue is typically hyperglycemia after eating late at night/food choices.   Breakfast: Eggs, oatmeal or 1 slice whole wheat toast Lunch:  Sandwich Dinner: Fast food ~3 times week. Meat (hamburger, chick), vegetable (green beans, frozen mix), sometimes potatoes Beverages: Diet coke and water  Current physical activity:  Walks around house with walker, as the cement flooring hurts her knees. Otherwise, ambulates without assistance device.    Objective:  Lab Results  Component Value Date   HGBA1C 7.4 (H) 07/09/2023   Lab Results  Component Value Date   CREATININE 0.96 07/09/2023   BUN 23 07/09/2023   NA 140 07/09/2023   K 4.3 07/09/2023   CL 103 07/09/2023   CO2 29 07/09/2023   Lab Results  Component Value Date   CHOL 133 07/09/2023   HDL 50.90 07/09/2023   LDLCALC 57 07/09/2023   LDLDIRECT 58.0 06/01/2021   TRIG 121.0 07/09/2023   CHOLHDL 3 07/09/2023   Medications Reviewed Today     Reviewed by Loree Fee, RPH (Pharmacist) on 07/15/23 at 1554  Med List Status: <None>   Medication Order Taking? Sig Documenting Provider Last Dose Status Informant  aspirin 81 MG tablet [id number] No Take 81 mg by mouth daily. [provider] Taking Active Self  cetirizine (ZYRTEC) 10 MG tablet [id number] No Take 10 mg by mouth daily.  [provider] Taking Active Self  empagliflozin (JARDIANCE) 25 MG TABS tablet [id number]  Take 1 tablet (25 mg total) by mouth daily. Dale [place], MD  Active   flecainide [facility]) 100 MG tablet [id number] No Take 100 mg by mouth 2 (two) times daily. [provider] Taking Active   hydrochlorothiazide (HYDRODIURIL) 25  MG tablet [id number]  Take 1 tablet (25 mg total) by mouth daily. Dale [place], MD  Active   Insulin Pen Needle (DROPLET PEN NEEDLES) 32G X 4 MM MISC [id number]  USE TO INJECT INSULIN DAILY Dale Hock[place]in, MD  Active   lisinopril (ZESTRIL) 20 MG tablet [id number]  Take 1 tablet (20 mg total) by mouth daily. Dale [place], MD  Active   metFORMIN (GLUCOPHAGE-XR) 500 MG 24 hr tablet [id number]  Take 2 tablets (1,000 mg total) by mouth 2 (two) times daily. Dale McAlester, MD  Active   methylcellulose oral powder [id number]  Take by mouth daily. [provider]  Active   metoprolol tartrate (LOPRESSOR) 25 MG tablet [id number]  TAKE 1 TABLET BY MOUTH 2 TIMES A Donne Anon, MD  Active   Multiple Vitamin (MULTIVITAMIN) tablet [id number] No Take 1 tablet by mouth daily. [provider] Taking Active Self  omeprazole (PRILOSEC) 20 MG capsule [id number] No TAKE 1 CAPSULE BY MOUTH TWICE DAILY  Patient taking differently: Take 20 mg by mouth daily.   Dale [place], MD Taking Active            Med Note Tyrone Nine Nov 19, 2019 11:08 AM) Once daily   oxybutynin (DITROPAN) 5 MG tablet [id number]  TAKE 1 TABLET BY MOUTH 2 TIMES A DAY Scott, Charlene, MD  Active   rosuvastatin (CRESTOR) 10 MG tablet [id number]  Take 1 tablet (10 mg total) by mouth daily. Dale Bairoil, MD  Active   TRESIBA FLEXTOUCH 100 UNIT/ML FlexTouch Pen [id number]  INJECT 38 UNITS INTO THE SKIN DAILY. TITRATE AS INSTRUCTED TO 50 UNITS DAILY Dale [place], MD  Active            Assessment/Plan:  Diabetes: Uncontrolled per A1c 7.4% (07/09/23)m slightly improved from 7.5% (02/28/23). Unfortunately, patient has not tracked sugars consistently over the past ~month or so however, placed a Libre sensor 4 days ago which shows excellent glycemic control with 94% TIR and no c/f lows. If glycemic control remains stable, there is no doubt that her A1c will be at goal with next reading.  Continue Tresiba 46 units  daily Continue metformin XR 1000 mg twice daily Continue Jardiance 25 mg daily Discussed increasing physical activity (such as gym membership or classes). Consider chair exercise or pool workouts if joints ache with walking.   Diet: Congratulated on her great progress. Goal to maintain focus on minimizing fast food/eating later in the evenings.  Patient to reach out if any c/f lower sugars.  Future Consideration: GLP1-RA: Previously did not qualify for Ozempic MAP due to exceeding income requirement. Husband currently unemployed though she believes he will start new job very soon. Continue to reassess income for possible MAP eligibility in 2025.    ASCVD Risk-Reduction: Lipids well controlled with LDL 59 mg/dL (04/5461) which is below goal of <70 mg/dL on rosuvastatin 10 mg daily. Blood pressure in clinic variable, though lowest readings typically <130/80 mmHg. Has BP cuff.  Check BP at home occasionally in the morning with home cuff (not validated in clinic). Continue all medications as prescribed, no changes warranted.   Follow Up Plan:  PCP  as scheduled in ~3 months PharmD follow up as needed. Currently stable w good glycemic control. Can re-assess possibility of GLP1RA in the new year to promote minimization of insulin requirement.   Future Appointments  Date Time Provider Department Center  09/16/2023  3:00 PM [facility] PHARMACIST [facility]  09/25/2023  3:30 PM Elie Goody, MD [facility] None  10/16/2023  3:00 PM [facility] ANNUAL WELLNESS VISIT [facility]  10/29/2023 10:30 AM [facility] LAB [facility]  10/31/2023  3:30 PM Dale Redkey, MD [facility]   Loree Fee, PharmD Clinical Pharmacist [facility] [phone]

## 2023-07-30 ENCOUNTER — Encounter: Payer: Self-pay | Admitting: *Deleted

## 2023-08-30 ENCOUNTER — Encounter: Payer: Self-pay | Admitting: *Deleted

## 2023-09-02 ENCOUNTER — Ambulatory Visit
Admission: RE | Admit: 2023-09-02 | Discharge: 2023-09-02 | Disposition: A | Discharge status: Home / Self Care | Payer: Medicare Other | Attending: Gastroenterology | Admitting: Gastroenterology

## 2023-09-02 ENCOUNTER — Other Ambulatory Visit: Payer: Self-pay

## 2023-09-02 ENCOUNTER — Ambulatory Visit: Payer: Medicare Other | Admitting: Registered Nurse

## 2023-09-02 ENCOUNTER — Encounter: Payer: Self-pay | Admitting: *Deleted

## 2023-09-02 ENCOUNTER — Encounter
Admission: RE | Disposition: A | Discharge status: Home / Self Care | Payer: Self-pay | Source: Home / Self Care | Attending: Gastroenterology

## 2023-09-02 DIAGNOSIS — Z83719 Family history of colon polyps, unspecified: Secondary | ICD-10-CM | POA: Diagnosis not present

## 2023-09-02 DIAGNOSIS — Z90710 Acquired absence of both cervix and uterus: Secondary | ICD-10-CM | POA: Insufficient documentation

## 2023-09-02 DIAGNOSIS — Z6841 Body Mass Index (BMI) 40.0 and over, adult: Secondary | ICD-10-CM | POA: Insufficient documentation

## 2023-09-02 DIAGNOSIS — Z794 Long term (current) use of insulin: Secondary | ICD-10-CM | POA: Insufficient documentation

## 2023-09-02 DIAGNOSIS — I10 Essential (primary) hypertension: Secondary | ICD-10-CM | POA: Insufficient documentation

## 2023-09-02 DIAGNOSIS — E119 Type 2 diabetes mellitus without complications: Secondary | ICD-10-CM | POA: Diagnosis not present

## 2023-09-02 DIAGNOSIS — Z800 Family history of malignant neoplasm of digestive organs: Secondary | ICD-10-CM | POA: Diagnosis not present

## 2023-09-02 DIAGNOSIS — I209 Angina pectoris, unspecified: Secondary | ICD-10-CM | POA: Diagnosis not present

## 2023-09-02 DIAGNOSIS — E66813 Obesity, class 3: Secondary | ICD-10-CM | POA: Diagnosis not present

## 2023-09-02 DIAGNOSIS — G4733 Obstructive sleep apnea (adult) (pediatric): Secondary | ICD-10-CM | POA: Insufficient documentation

## 2023-09-02 DIAGNOSIS — Z1211 Encounter for screening for malignant neoplasm of colon: Secondary | ICD-10-CM | POA: Insufficient documentation

## 2023-09-02 DIAGNOSIS — K219 Gastro-esophageal reflux disease without esophagitis: Secondary | ICD-10-CM | POA: Insufficient documentation

## 2023-09-02 DIAGNOSIS — Z7984 Long term (current) use of oral hypoglycemic drugs: Secondary | ICD-10-CM | POA: Insufficient documentation

## 2023-09-02 HISTORY — PX: COLONOSCOPY WITH PROPOFOL: SHX5780

## 2023-09-02 LAB — GLUCOSE, CAPILLARY: Glucose-Capillary: 94 mg/dL (ref 70–99)

## 2023-09-02 SURGERY — COLONOSCOPY WITH PROPOFOL
Anesthesia: General

## 2023-09-02 MED ORDER — LIDOCAINE HCL (PF) 2 % IJ SOLN
INTRAMUSCULAR | Status: DC | PRN
  Administered 2023-09-02: 40 mg via INTRADERMAL

## 2023-09-02 MED ORDER — PROPOFOL 500 MG/50ML IV EMUL
INTRAVENOUS | Status: DC | PRN
  Administered 2023-09-02: 125 ug/kg/min via INTRAVENOUS

## 2023-09-02 MED ORDER — PROPOFOL 10 MG/ML IV BOLUS
INTRAVENOUS | Status: DC | PRN
  Administered 2023-09-02: 70 mg via INTRAVENOUS

## 2023-09-02 MED ORDER — PROPOFOL 1000 MG/100ML IV EMUL
INTRAVENOUS | Status: AC
  Filled 2023-09-02: qty 100

## 2023-09-02 MED ORDER — LIDOCAINE HCL (PF) 2 % IJ SOLN
INTRAMUSCULAR | Status: AC
  Filled 2023-09-02: qty 5

## 2023-09-02 MED ORDER — SODIUM CHLORIDE 0.9 % IV SOLN: INTRAVENOUS | Status: DC

## 2023-09-02 MED ORDER — PROPOFOL 10 MG/ML IV BOLUS
INTRAVENOUS | Status: AC
  Filled 2023-09-02: qty 20

## 2023-09-02 NOTE — H&P (Signed)
Outpatient short stay form Pre-procedure 09/02/2023  Regis Bill, MD  Primary Physician: Dale [place], MD  Reason for visit:  High risk screening  History of present illness:    67 y/o lady with history of hypertension, DM II, OSA, and obesity here for screening colonoscopy due to family history of polyps. Last colonoscopy in 2017 was unremarkable. No blood thinners. History of hysterectomy. Brother with esophageal cancer.    Current Facility-Administered Medications:    0.9 %  sodium chloride infusion, , Intravenous, Continuous, Rilley Stash, Rossie Muskrat, MD, Last Rate: 40 mL/hr at 09/02/23 0838, New Bag at 09/02/23 1610  Medications Prior to Admission  Medication Sig Dispense Refill Last Dose/Taking   flecainide (TAMBOCOR) 100 MG tablet Take 100 mg by mouth 2 (two) times daily.   09/01/2023   hydrochlorothiazide (HYDRODIURIL) 25 MG tablet Take 1 tablet (25 mg total) by mouth daily. 90 tablet 1 09/01/2023   lisinopril (ZESTRIL) 20 MG tablet Take 1 tablet (20 mg total) by mouth daily. 90 tablet 1 09/01/2023   metoprolol tartrate (LOPRESSOR) 25 MG tablet TAKE 1 TABLET BY MOUTH 2 TIMES A DAY 180 tablet 1 09/01/2023   oxybutynin (DITROPAN) 5 MG tablet TAKE 1 TABLET BY MOUTH 2 TIMES A DAY 180 tablet 1 09/01/2023   rosuvastatin (CRESTOR) 10 MG tablet Take 1 tablet (10 mg total) by mouth daily. 90 tablet 3 09/01/2023   TRESIBA FLEXTOUCH 100 UNIT/ML FlexTouch Pen INJECT 38 UNITS INTO THE SKIN DAILY. TITRATE AS INSTRUCTED TO 50 UNITS DAILY 15 mL 2 09/01/2023   aspirin 81 MG tablet Take 81 mg by mouth daily.   08/31/2023   cetirizine (ZYRTEC) 10 MG tablet Take 10 mg by mouth daily.      empagliflozin (JARDIANCE) 25 MG TABS tablet Take 1 tablet (25 mg total) by mouth daily. 90 tablet 1 08/30/2023   Insulin Pen Needle (DROPLET PEN NEEDLES) 32G X 4 MM MISC USE TO INJECT INSULIN DAILY 100 each 3    metFORMIN (GLUCOPHAGE-XR) 500 MG 24 hr tablet Take 2 tablets (1,000 mg total) by mouth 2 (two)  times daily. 360 tablet 1 08/30/2023   methylcellulose oral powder Take by mouth daily.      Multiple Vitamin (MULTIVITAMIN) tablet Take 1 tablet by mouth daily.   08/31/2023   omeprazole (PRILOSEC) 20 MG capsule TAKE 1 CAPSULE BY MOUTH TWICE DAILY (Patient taking differently: Take 20 mg by mouth daily.) 60 capsule 5 08/31/2023     Allergies  Allergen Reactions   Erythromycin Hives   Augmentin [Amoxicillin-Pot Clavulanate] Hives   Demerol [Meperidine] Nausea Only     Past Medical History:  Diagnosis Date   Abnormal results of thyroid function studies    Allergy    hay fever   Angina pectoris, unspecified (HCC)    Arthritis    Atrial fibrillation, unspecified    Chicken pox    Cold hands    Congenital anomaly of adrenal gland    Diabetes mellitus    type 2, uncomplicated   Essential hypertension, benign    Family history of adverse reaction to anesthesia    Mother - PONV   GERD (gastroesophageal reflux disease)    IBS (irritable bowel syndrome)    Malaise    Morbid obesity (HCC)    Murmur    Pain in soft tissues of limb    Proteinuria    Pure hypercholesterolemia    Rash and other nonspecific skin eruption    Sleep apnea    Obstructive; Uses C-Pap machine  Urinary incontinence    Vertigo    1 episode, approx 2013   Wears contact lenses    left eye    Review of systems:  Otherwise negative.    Physical Exam  Gen: Alert, oriented. Appears stated age.  HEENT: PERRLA. Lungs: No respiratory distress CV: RRR Abd: soft, benign, no masses Ext: No edema    Planned procedures: Proceed with colonoscopy. The patient understands the nature of the planned procedure, indications, risks, alternatives and potential complications including but not limited to bleeding, infection, perforation, damage to internal organs and possible oversedation/side effects from anesthesia. The patient agrees and gives consent to proceed.  Please refer to procedure notes for findings,  recommendations and patient disposition/instructions.     Regis Bill, MD [facility]

## 2023-09-02 NOTE — Op Note (Signed)
[facility] Patient Name: Christine Robertson Procedure Date: 09/02/2023 9:17 AM MRN: [id number] Account #: [id number] Date of Birth: 20-Aug-1956 Admit Type: Outpatient Age: 67 Room: [facility] ENDO ROOM 3 Gender: Female Note Status: Finalized Instrument Name: Prentice Docker [id number] Procedure:             Colonoscopy Indications:           Colon cancer screening in patient at increased risk:                         Family history of 1st-degree relative with colon polyps Providers:             Eather Colas MD, MD Referring MD:          Dale [place], MD (Referring MD) Medicines:             Monitored Anesthesia Care Complications:         No immediate complications. Procedure:             Pre-Anesthesia Assessment:                        - Prior to the procedure, a History and Physical was                         performed, and patient medications and allergies were                         reviewed. The patient is competent. The risks and                         benefits of the procedure and the sedation options and                         risks were discussed with the patient. All questions                         were answered and informed consent was obtained.                         Patient identification and proposed procedure were                         verified by the physician, the nurse, the                         anesthesiologist, the anesthetist and the technician                         in the endoscopy suite. Mental Status Examination:                         alert and oriented. Airway Examination: normal                         oropharyngeal airway and neck mobility. Respiratory                         Examination: clear to auscultation. CV Examination:  normal. Prophylactic Antibiotics: The patient does not                         require prophylactic antibiotics. Prior                         Anticoagulants: The  patient has taken no anticoagulant                         or antiplatelet agents. ASA Grade Assessment: III - A                         patient with severe systemic disease. After reviewing                         the risks and benefits, the patient was deemed in                         satisfactory condition to undergo the procedure. The                         anesthesia plan was to use monitored anesthesia care                         (MAC). Immediately prior to administration of                         medications, the patient was re-assessed for adequacy                         to receive sedatives. The heart rate, respiratory                         rate, oxygen saturations, blood pressure, adequacy of                         pulmonary ventilation, and response to care were                         monitored throughout the procedure. The physical                         status of the patient was re-assessed after the                         procedure.                        After obtaining informed consent, the colonoscope was                         passed under direct vision. Throughout the procedure,                         the patient's blood pressure, pulse, and oxygen                         saturations were monitored continuously. The  Colonoscope was introduced through the anus and                         advanced to the the terminal ileum, with                         identification of the appendiceal orifice and IC                         valve. The colonoscopy was performed without                         difficulty. The patient tolerated the procedure well.                         The quality of the bowel preparation was good. The                         terminal ileum, ileocecal valve, appendiceal orifice,                         and rectum were photographed. Findings:      The perianal and digital rectal examinations were normal.      The  terminal ileum appeared normal.      The entire examined colon appeared normal on direct and retroflexion       views. Impression:            - The examined portion of the ileum was normal.                        - The entire examined colon is normal on direct and                         retroflexion views.                        - No specimens collected. Recommendation:        - Discharge patient to home.                        - Resume previous diet.                        - Continue present medications.                        - Repeat colonoscopy in 10 years for screening                         purposes.                        - Return to referring physician as previously                         scheduled. Procedure Code(s):     --- Professional ---                        W1191, Colorectal cancer screening; colonoscopy on  individual at high risk Diagnosis Code(s):     --- Professional ---                        Z83.71, Family history of colonic polyps CPT copyright 2022 [employer]. All rights reserved. The codes documented in this report are preliminary and upon coder review may  be revised to meet current compliance requirements. Eather Colas MD, MD 09/02/2023 9:39:10 AM Number of Addenda: 0 Note Initiated On: 09/02/2023 9:17 AM Scope Withdrawal Time: 0 hours 7 minutes 3 seconds  Total Procedure Duration: 0 hours 11 minutes 36 seconds  Estimated Blood Loss:  Estimated blood loss: none.      [facility]

## 2023-09-02 NOTE — Transfer of Care (Signed)
Immediate Anesthesia Transfer of Care Note  Patient: Christine Robertson  Procedure(s) Performed: COLONOSCOPY WITH PROPOFOL  Patient Location: PACU  Anesthesia Type:General  Level of Consciousness: awake, alert , and oriented  Airway & Oxygen Therapy: Patient Spontanous Breathing  Post-op Assessment: Report given to RN and Post -op Vital signs reviewed and stable  Post vital signs: stable  Last Vitals:  Vitals Value Taken Time  BP 98/48 09/02/23 0940  Temp    Pulse 59 09/02/23 0940  Resp 39 09/02/23 0940  SpO2 100 % 09/02/23 0940  Vitals shown include unfiled device data.  Last Pain:  Vitals:   09/02/23 0824  TempSrc: Temporal  PainSc: 0-No pain         Complications: No notable events documented.

## 2023-09-02 NOTE — Anesthesia Preprocedure Evaluation (Signed)
Anesthesia Evaluation  Patient identified by MRN, date of birth, ID band Patient awake    Reviewed: Allergy & Precautions, H&P , NPO status , Patient's Chart, lab work & pertinent test results, reviewed documented beta blocker date and time   History of Anesthesia Complications Negative for: history of anesthetic complications  Airway Mallampati: IV  TM Distance: >3 FB Neck ROM: full    Dental no notable dental hx. (+) Teeth Intact   Pulmonary neg shortness of breath, asthma , sleep apnea , neg recent URI   Pulmonary exam normal breath sounds clear to auscultation       Cardiovascular Exercise Tolerance: Good hypertension, On Medications + angina  (-) Past MI (-) dysrhythmias + Valvular Problems/Murmurs  Rhythm:regular Rate:Normal     Neuro/Psych negative neurological ROS  negative psych ROS   GI/Hepatic Neg liver ROS,GERD  Medicated,,  Endo/Other  diabetes  Class 3 obesity  Renal/GU      Musculoskeletal   Abdominal   Peds  Hematology  (+) Blood dyscrasia, anemia   Anesthesia Other Findings Past Medical History: No date: Abnormal results of thyroid function studies No date: Allergy     Comment:  hay fever No date: Angina pectoris, unspecified (HCC) No date: Arthritis No date: Atrial fibrillation, unspecified No date: Chicken pox No date: Cold hands No date: Congenital anomaly of adrenal gland No date: Diabetes mellitus     Comment:  type 2, uncomplicated No date: Essential hypertension, benign No date: Family history of adverse reaction to anesthesia     Comment:  Mother - PONV No date: GERD (gastroesophageal reflux disease) No date: IBS (irritable bowel syndrome) No date: Malaise No date: Morbid obesity (HCC) No date: Murmur No date: Pain in soft tissues of limb No date: Proteinuria No date: Pure hypercholesterolemia No date: Rash and other nonspecific skin eruption No date: Sleep apnea      Comment:  Obstructive; Uses C-Pap machine No date: Urinary incontinence No date: Vertigo     Comment:  1 episode, approx 2013 No date: Wears contact lenses     Comment:  left eye   Reproductive/Obstetrics negative OB ROS                             Anesthesia Physical Anesthesia Plan  ASA: 3  Anesthesia Plan: General   Post-op Pain Management:    Induction: Intravenous  PONV Risk Score and Plan: 3 and Propofol infusion and TIVA  Airway Management Planned: Natural Airway and Nasal Cannula  Additional Equipment:   Intra-op Plan:   Post-operative Plan:   Informed Consent: I have reviewed the patients History and Physical, chart, labs and discussed the procedure including the risks, benefits and alternatives for the proposed anesthesia with the patient or authorized representative who has indicated his/her understanding and acceptance.     Dental Advisory Given  Plan Discussed with: CRNA  Anesthesia Plan Comments: (Patient consented for risks of anesthesia including but not limited to:  - adverse reactions to medications - damage to eyes, teeth, lips or other oral mucosa - nerve damage due to positioning  - sore throat or hoarseness - Damage to heart, brain, nerves, lungs, other parts of body or loss of life  Patient voiced understanding.)        Anesthesia Quick Evaluation

## 2023-09-02 NOTE — Anesthesia Postprocedure Evaluation (Signed)
Anesthesia Post Note  Patient: Christine Robertson  Procedure(s) Performed: COLONOSCOPY WITH PROPOFOL  Patient location during evaluation: Endoscopy Anesthesia Type: General Level of consciousness: awake and alert Pain management: pain level controlled Vital Signs Assessment: post-procedure vital signs reviewed and stable Respiratory status: spontaneous breathing, nonlabored ventilation, respiratory function stable and patient connected to nasal cannula oxygen Cardiovascular status: blood pressure returned to baseline and stable Postop Assessment: no apparent nausea or vomiting Anesthetic complications: no   No notable events documented.   Last Vitals:  Vitals:   09/02/23 0938 09/02/23 0942  BP: (!) 98/48 116/65  Pulse: 63   Resp: (!) 21 20  Temp: (!) 36.3 C   SpO2: 100%     Last Pain:  Vitals:   09/02/23 0948  TempSrc:   PainSc: 0-No pain                 Lenard Simmer

## 2023-09-02 NOTE — Interval H&P Note (Signed)
History and Physical Interval Note:  09/02/2023 9:15 AM  Christine Robertson  has presented today for surgery, with the diagnosis of FAMILY HX OFPOLYPS OF COLON.  The various methods of treatment have been discussed with the patient and family. After consideration of risks, benefits and other options for treatment, the patient has consented to  Procedure(s): COLONOSCOPY WITH PROPOFOL (N/A) as a surgical intervention.  The patient's history has been reviewed, patient examined, no change in status, stable for surgery.  I have reviewed the patient's chart and labs.  Questions were answered to the patient's satisfaction.     Regis Bill  Ok to proceed with colonoscopy

## 2023-09-03 ENCOUNTER — Encounter: Payer: Self-pay | Admitting: Gastroenterology

## 2023-09-16 ENCOUNTER — Other Ambulatory Visit: Payer: Medicare Other

## 2023-09-19 ENCOUNTER — Telehealth: Payer: Self-pay

## 2023-09-19 NOTE — Telephone Encounter (Signed)
Outreaching to pt via Mychart regarding 2025 re enrollment for Jardiance through [facility]

## 2023-09-25 ENCOUNTER — Ambulatory Visit (INDEPENDENT_AMBULATORY_CARE_PROVIDER_SITE_OTHER): Payer: Medicare Other | Admitting: Dermatology

## 2023-09-25 DIAGNOSIS — L570 Actinic keratosis: Secondary | ICD-10-CM

## 2023-09-25 DIAGNOSIS — W908XXA Exposure to other nonionizing radiation, initial encounter: Secondary | ICD-10-CM

## 2023-09-25 DIAGNOSIS — L821 Other seborrheic keratosis: Secondary | ICD-10-CM

## 2023-09-25 DIAGNOSIS — L814 Other melanin hyperpigmentation: Secondary | ICD-10-CM

## 2023-09-25 DIAGNOSIS — L578 Other skin changes due to chronic exposure to nonionizing radiation: Secondary | ICD-10-CM

## 2023-09-25 NOTE — Patient Instructions (Addendum)

## 2023-09-25 NOTE — Progress Notes (Signed)
   New Patient Visit   Subjective  Christine Robertson is a 68 y.o. female who presents for the following: Spots on the face, once area on face gets a scab, present ~2 years.   The patient has spots, moles and lesions to be evaluated, some may be new or changing.   Referral from Dr Dale Phelps.   The following portions of the chart were reviewed this encounter and updated as appropriate: medications, allergies, medical history  Review of Systems:  No other skin or systemic complaints except as noted in HPI or Assessment and Plan.  Objective  Well appearing patient in no apparent distress; mood and affect are within normal limits.  A focused examination was performed of the following areas: Face  Relevant physical exam findings are noted in the Assessment and Plan.  Right zygoma x 1 Pink scaly macule.  Assessment & Plan  ACTINIC DAMAGE - chronic, secondary to cumulative UV radiation exposure/sun exposure over time - diffuse scaly erythematous macules with underlying dyspigmentation - Recommend daily broad spectrum sunscreen SPF 30+ to sun-exposed areas, reapply every 2 hours as needed.  - Recommend staying in the shade or wearing long sleeves, sun glasses (UVA+UVB protection) and wide brim hats (4-inch brim around the entire circumference of the hat). - Call for new or changing lesions.  SEBORRHEIC KERATOSIS - Stuck-on, waxy, tan-brown papules and/or plaques, including right infraocular  - Benign-appearing - Discussed benign etiology and prognosis. - Observe - Call for any changes  LENTIGINES Exam: scattered tan macules Due to sun exposure Treatment Plan: Benign-appearing, observe. Recommend daily broad spectrum sunscreen SPF 30+ to sun-exposed areas, reapply every 2 hours as needed.  Call for any changes AK (ACTINIC KERATOSIS) Right zygoma x 1 Actinic keratoses are precancerous spots that appear secondary to cumulative UV radiation exposure/sun exposure over time. They  are chronic with expected duration over 1 year. A portion of actinic keratoses will progress to squamous cell carcinoma of the skin. It is not possible to reliably predict which spots will progress to skin cancer and so treatment is recommended to prevent development of skin cancer.  Recommend daily broad spectrum sunscreen SPF 30+ to sun-exposed areas, reapply every 2 hours as needed.  Recommend staying in the shade or wearing long sleeves, sun glasses (UVA+UVB protection) and wide brim hats (4-inch brim around the entire circumference of the hat). Call for new or changing lesions. Destruction of lesion - Right zygoma x 1  Destruction method: cryotherapy   Informed consent: discussed and consent obtained   Lesion destroyed using liquid nitrogen: Yes   Region frozen until ice ball extended beyond lesion: Yes   Outcome: patient tolerated procedure well with no complications   Post-procedure details: wound care instructions given   Additional details:  Prior to procedure, discussed risks of blister formation, small wound, skin dyspigmentation, or rare scar following cryotherapy. Recommend Vaseline ointment to treated areas while healing.    Return if symptoms worsen or fail to improve.  [name], CMA, am acting as scribe for Willeen Niece, MD .   Documentation: I have reviewed the above documentation for accuracy and completeness, and I agree with the above.  Willeen Niece, MD

## 2023-10-01 ENCOUNTER — Telehealth: Payer: Self-pay

## 2023-10-01 NOTE — Telephone Encounter (Signed)
Reached out to patient for PAP renewal 2025 for Jardiance  [facility] cares -could not leave V/M

## 2023-10-07 NOTE — Telephone Encounter (Signed)
Spoke to patient and she no longer needs patient assistance with Jardiance- receiving through another source.

## 2023-10-14 ENCOUNTER — Other Ambulatory Visit: Payer: Medicare Other

## 2023-10-15 NOTE — Telephone Encounter (Signed)
PT no longer requesting assistance, documented in separate encounter

## 2023-10-29 ENCOUNTER — Other Ambulatory Visit (INDEPENDENT_AMBULATORY_CARE_PROVIDER_SITE_OTHER): Payer: Medicare Other

## 2023-10-29 DIAGNOSIS — E7800 Pure hypercholesterolemia, unspecified: Secondary | ICD-10-CM | POA: Diagnosis not present

## 2023-10-29 DIAGNOSIS — E1165 Type 2 diabetes mellitus with hyperglycemia: Secondary | ICD-10-CM

## 2023-10-29 LAB — LIPID PANEL
Cholesterol: 120 mg/dL (ref 0–200)
HDL: 52.4 mg/dL (ref >39.00)
LDL Cholesterol: 43 mg/dL (ref 0–99)
NonHDL: 67.32
Total CHOL/HDL Ratio: 2
Triglycerides: 124 mg/dL (ref 0.0–149.0)
VLDL: 24.8 mg/dL (ref 0.0–40.0)

## 2023-10-29 LAB — BASIC METABOLIC PANEL
BUN: 30 mg/dL — ABNORMAL HIGH (ref 6–23)
CO2: 28 meq/L (ref 19–32)
Calcium: 9.5 mg/dL (ref 8.4–10.5)
Chloride: 102 meq/L (ref 96–112)
Creatinine, Ser: 1 mg/dL (ref 0.40–1.20)
GFR: 57.46 mL/min — ABNORMAL LOW (ref >60.00)
Glucose, Bld: 189 mg/dL — ABNORMAL HIGH (ref 70–99)
Potassium: 4 meq/L (ref 3.5–5.1)
Sodium: 140 meq/L (ref 135–145)

## 2023-10-29 LAB — HEPATIC FUNCTION PANEL
ALT: 21 U/L (ref 0–35)
AST: 19 U/L (ref 0–37)
Albumin: 4.2 g/dL (ref 3.5–5.2)
Alkaline Phosphatase: 59 U/L (ref 39–117)
Bilirubin, Direct: 0.1 mg/dL (ref 0.0–0.3)
Total Bilirubin: 0.4 mg/dL (ref 0.2–1.2)
Total Protein: 7.3 g/dL (ref 6.0–8.3)

## 2023-10-29 LAB — HEMOGLOBIN A1C: Hgb A1c MFr Bld: 8.2 % — ABNORMAL HIGH (ref 4.6–6.5)

## 2023-10-31 ENCOUNTER — Ambulatory Visit (INDEPENDENT_AMBULATORY_CARE_PROVIDER_SITE_OTHER): Payer: Medicare Other | Admitting: Internal Medicine

## 2023-10-31 ENCOUNTER — Encounter: Payer: Self-pay | Admitting: Internal Medicine

## 2023-10-31 VITALS — BP 136/72 | HR 88 | Temp 98.0°F | Resp 16 | Ht 63.5 in | Wt 279.0 lb

## 2023-10-31 DIAGNOSIS — I890 Lymphedema, not elsewhere classified: Secondary | ICD-10-CM

## 2023-10-31 DIAGNOSIS — I10 Essential (primary) hypertension: Secondary | ICD-10-CM

## 2023-10-31 DIAGNOSIS — E1165 Type 2 diabetes mellitus with hyperglycemia: Secondary | ICD-10-CM | POA: Diagnosis not present

## 2023-10-31 DIAGNOSIS — K219 Gastro-esophageal reflux disease without esophagitis: Secondary | ICD-10-CM

## 2023-10-31 DIAGNOSIS — E7800 Pure hypercholesterolemia, unspecified: Secondary | ICD-10-CM

## 2023-10-31 DIAGNOSIS — R000 Tachycardia, unspecified: Secondary | ICD-10-CM | POA: Diagnosis not present

## 2023-10-31 DIAGNOSIS — G4733 Obstructive sleep apnea (adult) (pediatric): Secondary | ICD-10-CM

## 2023-10-31 DIAGNOSIS — I4891 Unspecified atrial fibrillation: Secondary | ICD-10-CM | POA: Diagnosis not present

## 2023-10-31 NOTE — Progress Notes (Signed)
 Subjective:    Patient ID: Christine Robertson, female    DOB: September 02, 1956, 68 y.o.   MRN: [id number]  Patient here for  Chief Complaint  Patient presents with   Medical Management of Chronic Issues    HPI Here for a scheduled follow up - follow up regarding diabetes, paroxysmal afib - maintained on metoprolol and flecainide, hypercholesterolemia and OSA.  She reports she has not been watching her diet. Not using her monitor. Not checking sugars. Not exercising. Recent A1c increased to 8.2. discussed importance of diet and exercise. Also not taking her medications regularly. May skip some. She is followed by cardiology. Last evaluated 09/13/23 - recommended to continue flecainide and metoprolol. Continues on aspirin. She informed me today that she has sopped her flecainide. Has noticed over the last month, some increased heart beat at times - reports heart rate in the 90s. Breathing overall stable. No chest pain.    Past Medical History:  Diagnosis Date   Abnormal results of thyroid function studies    Allergy    hay fever   Angina pectoris, unspecified (HCC)    Arthritis    Atrial fibrillation, unspecified    Chicken pox    Cold hands    Congenital anomaly of adrenal gland    Diabetes mellitus    type 2, uncomplicated   Essential hypertension, benign    Family history of adverse reaction to anesthesia    Mother - PONV   GERD (gastroesophageal reflux disease)    IBS (irritable bowel syndrome)    Malaise    Morbid obesity (HCC)    Murmur    Pain in soft tissues of limb    Proteinuria    Pure hypercholesterolemia    Rash and other nonspecific skin eruption    Sleep apnea    Obstructive; Uses C-Pap machine   Urinary incontinence    Vertigo    1 episode, approx 2013   Wears contact lenses    left eye   Past Surgical History:  Procedure Laterality Date   ABDOMINAL HYSTERECTOMY  2010   BLADDER SURGERY  1967   stretching of bladder   BREAST BIOPSY Left 03/28/2015   done at  Dr. Rutherford Nail office. fibrocystic changes   CATARACT EXTRACTION W/PHACO Right 04/10/2022   Procedure: CATARACT EXTRACTION PHACO AND INTRAOCULAR LENS PLACEMENT (IOC) RIGHT DIABETIC VIVITY LENS;  Surgeon: Galen Manila, MD;  Location: [facility];  Service: Ophthalmology;  Laterality: Right;  6.89 0:40.8   CATARACT EXTRACTION W/PHACO Left 04/24/2022   Procedure: CATARACT EXTRACTION PHACO AND INTRAOCULAR LENS PLACEMENT (IOC) LEFT DIABETIC VIVITY  toric LENS 5.29 00:41.8;  Surgeon: Galen Manila, MD;  Location: [facility];  Service: Ophthalmology;  Laterality: Left;  Diabetic   COLONOSCOPY     COLONOSCOPY WITH PROPOFOL N/A 09/09/2015   Procedure: COLONOSCOPY WITH PROPOFOL;  Surgeon: Scot Jun, MD;  Location: [facility];  Service: Endoscopy;  Laterality: N/A;   COLONOSCOPY WITH PROPOFOL N/A 09/02/2023   Procedure: COLONOSCOPY WITH PROPOFOL;  Surgeon: Regis Bill, MD;  Location: [facility] END[facility]COPY;  Service: Endoscopy;  Laterality: N/A;   ESOPHAGOGASTRODUODENOSCOPY     EYE SURGERY     TONSILLECTOMY AND ADENOIDECTOMY  1959   URETHRAL DILATION     Family History  Problem Relation Age of Onset   Diabetes Mother    Cancer Father        lung   Cerebral aneurysm Brother 70       s/p rupture   Breast cancer  Maternal Grandmother 48   Breast cancer Maternal Aunt 84   Social History   Socioeconomic History   Marital status: Married    Spouse name: Not on file   Number of children: Not on file   Years of education: Not on file   Highest education level: 12th grade  Occupational History   Not on file  Tobacco Use   Smoking status: Never   Smokeless tobacco: Never  Vaping Use   Vaping status: Never Used  Substance and Sexual Activity   Alcohol use: No    Alcohol/week: 0.0 standard drinks of alcohol   Drug use: No   Sexual activity: Not on file  Other Topics Concern   Not on file  Social History Narrative   Not on file   Social Drivers of Health    Financial Resource Strain: Low Risk  (10/14/2023)   Overall Financial Resource Strain (CARDIA)    Difficulty of Paying Living Expenses: Not very hard  Food Insecurity: No Food Insecurity (10/14/2023)   Hunger Vital Sign    Worried About Running Out of Food in the Last Year: Never true    Ran Out of Food in the Last Year: Never true  Transportation Needs: No Transportation Needs (10/14/2023)   PRAPARE - Administrator, Civil Service (Medical): No    Lack of Transportation (Non-Medical): No  Physical Activity: Inactive (10/14/2023)   Exercise Vital Sign    Days of Exercise per Week: 0 days    Minutes of Exercise per Session: 0 min  Stress: No Stress Concern Present (10/14/2023)   [employer] of Occupational Health - Occupational Stress Questionnaire    Feeling of Stress : Not at all  Social Connections: Socially Integrated (10/14/2023)   Social Connection and Isolation Panel [NHANES]    Frequency of Communication with Friends and Family: More than three times a week    Frequency of Social Gatherings with Friends and Family: More than three times a week    Attends Religious Services: More than 4 times per year    Active Member of [employer] or Organizations: Yes    Attends Engineer, structural: More than 4 times per year    Marital Status: Married     Review of Systems  Constitutional:  Negative for appetite change and unexpected weight change.  HENT:  Negative for congestion and sinus pressure.   Respiratory:  Negative for cough and chest tightness.        Breathing stable.   Cardiovascular:  Negative for chest pain.       Chronic swelling - lymphedema lower extremities. Intermittent episodes of increased heart beat as outlined.   Gastrointestinal:  Negative for abdominal pain, diarrhea, nausea and vomiting.  Genitourinary:  Negative for difficulty urinating and dysuria.  Musculoskeletal:  Negative for joint swelling and myalgias.  Skin:  Negative for wound.        Chronic stasis changes, lymphedema lower extremities.   Neurological:  Negative for dizziness and headaches.  Psychiatric/Behavioral:  Negative for agitation and dysphoric mood.        Objective:     BP 136/72   Pulse 88   Temp 98 F (36.7 C)   Resp 16   Ht 5' 3.5" (1.613 m)   Wt 279 lb (126.6 kg)   SpO2 97%   BMI 48.65 kg/m  Wt Readings from Last 3 Encounters:  10/31/23 279 lb (126.6 kg)  09/02/23 284 lb (128.8 kg)  07/11/23 283 lb 12.8 oz (  128.7 kg)    Physical Exam Vitals reviewed.  Constitutional:      General: She is not in acute distress.    Appearance: Normal appearance.  HENT:     Head: Normocephalic and atraumatic.     Right Ear: External ear normal.     Left Ear: External ear normal.     Mouth/Throat:     Pharynx: No oropharyngeal exudate or posterior oropharyngeal erythema.  Eyes:     General: No scleral icterus.       Right eye: No discharge.        Left eye: No discharge.     Conjunctiva/sclera: Conjunctivae normal.  Neck:     Thyroid: No thyromegaly.  Cardiovascular:     Rate and Rhythm: Normal rate and regular rhythm.  Pulmonary:     Effort: No respiratory distress.     Breath sounds: Normal breath sounds. No wheezing.  Abdominal:     General: Bowel sounds are normal.     Palpations: Abdomen is soft.     Tenderness: There is no abdominal tenderness.  Musculoskeletal:        General: No tenderness.     Cervical back: Neck supple. No tenderness.     Comments: Chronic lymphedema changes - lower extremities.   Lymphadenopathy:     Cervical: No cervical adenopathy.  Skin:    Findings: No erythema or rash.  Neurological:     Mental Status: She is alert.  Psychiatric:        Mood and Affect: Mood normal.        Behavior: Behavior normal.         Outpatient Encounter Medications as of 10/31/2023  Medication Sig   aspirin 81 MG tablet Take 81 mg by mouth daily.   cetirizine (ZYRTEC) 10 MG tablet Take 10 mg by mouth daily.    empagliflozin (JARDIANCE) 25 MG TABS tablet Take 1 tablet (25 mg total) by mouth daily.   hydrochlorothiazide (HYDRODIURIL) 25 MG tablet Take 1 tablet (25 mg total) by mouth daily.   Insulin Pen Needle (DROPLET PEN NEEDLES) 32G X 4 MM MISC USE TO INJECT INSULIN DAILY   lisinopril (ZESTRIL) 20 MG tablet Take 1 tablet (20 mg total) by mouth daily.   metFORMIN (GLUCOPHAGE-XR) 500 MG 24 hr tablet Take 2 tablets (1,000 mg total) by mouth 2 (two) times daily.   methylcellulose oral powder Take by mouth daily.   metoprolol tartrate (LOPRESSOR) 25 MG tablet TAKE 1 TABLET BY MOUTH 2 TIMES A DAY   Multiple Vitamin (MULTIVITAMIN) tablet Take 1 tablet by mouth daily.   omeprazole (PRILOSEC) 20 MG capsule TAKE 1 CAPSULE BY MOUTH TWICE DAILY (Patient taking differently: Take 20 mg by mouth daily.)   oxybutynin (DITROPAN) 5 MG tablet TAKE 1 TABLET BY MOUTH 2 TIMES A DAY   rosuvastatin (CRESTOR) 10 MG tablet Take 1 tablet (10 mg total) by mouth daily.   TRESIBA FLEXTOUCH 100 UNIT/ML FlexTouch Pen INJECT 38 UNITS INTO THE SKIN DAILY. TITRATE AS INSTRUCTED TO 50 UNITS DAILY   No facility-administered encounter medications on file as of 10/31/2023.     Lab Results  Component Value Date   WBC 9.1 02/28/2023   HGB 13.4 02/28/2023   HCT 41.0 02/28/2023   PLT 227.0 02/28/2023   GLUCOSE 189 (H) 10/29/2023   CHOL 120 10/29/2023   TRIG 124.0 10/29/2023   HDL 52.40 10/29/2023   LDLDIRECT 58.0 06/01/2021   LDLCALC 43 10/29/2023   ALT 21 10/29/2023   AST  19 10/29/2023   NA 140 10/29/2023   K 4.0 10/29/2023   CL 102 10/29/2023   CREATININE 1.00 10/29/2023   BUN 30 (H) 10/29/2023   CO2 28 10/29/2023   TSH 0.72 02/28/2023   INR 0.99 11/15/2012   HGBA1C 8.2 (H) 10/29/2023   MICROALBUR <0.7 02/28/2023       Assessment & Plan:  Increased heart rate -     EKG 12-Lead  Atrial fibrillation, unspecified type [facility]) Assessment & Plan: Had follow-up with cardiology 09/13/2023.  At that time they had recommended  to continue flecainide and metoprolol.  Also continue aspirin daily.  She informing today that she stopped her flecainide.  Has noticed over the last month some increased heart beat at times.  On questioning she states her heart rate has been up in the 90s.  EKG obtained today revealed sinus rhythm.  No acute ischemic changes noted.  Some minimal T wave inversion in 3.  Discussed importance of taking medications as instructed.  Will plan follow-up with cardiology given intermittent symptoms as outlined.   Type 2 diabetes mellitus with hyperglycemia, without long-term current use of insulin (HCC) Assessment & Plan: She reports that she has not been watching her diet.  Not using her monitor.  Not checking sugars.  Not exercising.  Recent A1c increased to 8.2.  Discussed with her today regarding the importance of diet and exercise.  She has also not been taking her medications regularly.  May skip some.  Will hold on making changes in her medications.  Get her taking these medications as instructed.  Monitor diet and exercise.  Follow metabolic panel and A1c.  Restart the continuous glucose monitor.  Send in readings over the next few weeks.   Gastroesophageal reflux disease, unspecified whether esophagitis present Assessment & Plan: Continue Prilosec.   Hypercholesteremia Assessment & Plan: Continue Crestor.  Low-cholesterol diet and exercise.  Follow lipid panel liver function test. Lab Results  Component Value Date   CHOL 120 10/29/2023   HDL 52.40 10/29/2023   LDLCALC 43 10/29/2023   LDLDIRECT 58.0 06/01/2021   TRIG 124.0 10/29/2023   CHOLHDL 2 10/29/2023      Primary hypertension Assessment & Plan: Continue lisinopril, HCTZ and metoprolol.  Blood pressure as outlined.  Continue current medication regimen.  No changes today.  Follow pressures.   Lymphedema Assessment & Plan: Has a lymphedema pump.  Has not been using.  Instructed to use.  Discussed follow-up with [facility].   Obstructive sleep apnea Assessment & Plan: CPAP.      Dale Pajaros, MD

## 2023-11-03 ENCOUNTER — Telehealth: Payer: Self-pay | Admitting: Internal Medicine

## 2023-11-03 ENCOUNTER — Encounter: Payer: Self-pay | Admitting: Internal Medicine

## 2023-11-03 NOTE — Telephone Encounter (Signed)
 Is followed by cardiology - [facility]. They have been treating her with flecainide - has afib. She stopped her medication. Has noticed increased heart rate - intermittent episodes. Needs f/u with cardiology. Please schedule an appt.

## 2023-11-03 NOTE — Assessment & Plan Note (Signed)
 She reports that she has not been watching her diet.  Not using her monitor.  Not checking sugars.  Not exercising.  Recent A1c increased to 8.2.  Discussed with her today regarding the importance of diet and exercise.  She has also not been taking her medications regularly.  May skip some.  Will hold on making changes in her medications.  Get her taking these medications as instructed.  Monitor diet and exercise.  Follow metabolic panel and A1c.  Restart the continuous glucose monitor.  Send in readings over the next few weeks.

## 2023-11-03 NOTE — Assessment & Plan Note (Signed)
 Had follow-up with cardiology 09/13/2023.  At that time they had recommended to continue flecainide and metoprolol.  Also continue aspirin daily.  She informing today that she stopped her flecainide.  Has noticed over the last month some increased heart beat at times.  On questioning she states her heart rate has been up in the 90s.  EKG obtained today revealed sinus rhythm.  No acute ischemic changes noted.  Some minimal T wave inversion in 3.  Discussed importance of taking medications as instructed.  Will plan follow-up with cardiology given intermittent symptoms as outlined.

## 2023-11-03 NOTE — Assessment & Plan Note (Signed)
 Continue lisinopril, HCTZ and metoprolol.  Blood pressure as outlined.  Continue current medication regimen.  No changes today.  Follow pressures.

## 2023-11-03 NOTE — Assessment & Plan Note (Signed)
 Continue Crestor.  Low-cholesterol diet and exercise.  Follow lipid panel liver function test. Lab Results  Component Value Date   CHOL 120 10/29/2023   HDL 52.40 10/29/2023   LDLCALC 43 10/29/2023   LDLDIRECT 58.0 06/01/2021   TRIG 124.0 10/29/2023   CHOLHDL 2 10/29/2023

## 2023-11-03 NOTE — Assessment & Plan Note (Signed)
 Has a lymphedema pump.  Has not been using.  Instructed to use.  Discussed follow-up with [facility].

## 2023-11-03 NOTE — Assessment & Plan Note (Signed)
 Continue Prilosec

## 2023-11-03 NOTE — Assessment & Plan Note (Signed)
 CPAP.

## 2023-11-12 NOTE — Telephone Encounter (Signed)
 Spoke with patient and made her aware of Dr Lorin Picket recommendations. Patient says she will reach out to her Cardiologist to get an appointment scheduled. Patient advised to reach out to the office if any questions or concerns.

## 2023-11-18 ENCOUNTER — Other Ambulatory Visit: Payer: Self-pay | Admitting: Internal Medicine

## 2023-11-18 DIAGNOSIS — E1165 Type 2 diabetes mellitus with hyperglycemia: Secondary | ICD-10-CM

## 2023-11-22 LAB — HM DIABETES EYE EXAM

## 2023-11-25 ENCOUNTER — Telehealth: Payer: Self-pay

## 2023-11-25 ENCOUNTER — Other Ambulatory Visit: Payer: Medicare Other

## 2023-11-25 ENCOUNTER — Ambulatory Visit
Admission: RE | Admit: 2023-11-25 | Discharge: 2023-11-25 | Disposition: A | Discharge status: Home / Self Care | Source: Ambulatory Visit | Attending: Internal Medicine | Admitting: Internal Medicine

## 2023-11-25 ENCOUNTER — Other Ambulatory Visit: Payer: Self-pay

## 2023-11-25 DIAGNOSIS — Z1231 Encounter for screening mammogram for malignant neoplasm of breast: Secondary | ICD-10-CM | POA: Insufficient documentation

## 2023-11-25 DIAGNOSIS — E1165 Type 2 diabetes mellitus with hyperglycemia: Secondary | ICD-10-CM

## 2023-11-25 MED ORDER — TRESIBA FLEXTOUCH 100 UNIT/ML ~~LOC~~ SOPN: PEN_INJECTOR | SUBCUTANEOUS | 2 refills | Status: DC

## 2023-11-25 NOTE — Telephone Encounter (Signed)
Rx resent. Patient is aware.  

## 2023-11-25 NOTE — Telephone Encounter (Signed)
 Copied from CRM [id number]. Topic: Clinical - Prescription Issue >> Nov 25, 2023 10:39 AM Adele Barthel wrote: Reason for CRM:   Calling in regarding her TRESIBA FLEXTOUCH 100 UNIT/ML FlexTouch Pen refill request. Advised refills were sent over on 03/18 to the [facility], which she confirmed is correct pharmacy. However, she contacted pharmacy this morning and they are reporting they havent received it. Requested the refills be sent over again to [facility]  CB#   [phone]

## 2023-11-26 ENCOUNTER — Ambulatory Visit (INDEPENDENT_AMBULATORY_CARE_PROVIDER_SITE_OTHER): Payer: Medicare Other

## 2023-11-26 VITALS — BP 136/72 | HR 88 | Ht 63.0 in | Wt 279.0 lb

## 2023-11-26 DIAGNOSIS — Z0000 Encounter for general adult medical examination without abnormal findings: Secondary | ICD-10-CM | POA: Diagnosis not present

## 2023-11-26 DIAGNOSIS — Z13820 Encounter for screening for osteoporosis: Secondary | ICD-10-CM | POA: Diagnosis not present

## 2023-11-26 NOTE — Patient Instructions (Signed)
 [name] , Thank you for taking time to come for your Medicare Wellness Visit. I appreciate your ongoing commitment to your health goals. Please review the following plan we discussed and let me know if I can assist you in the future.   Referrals/Orders/Follow-Ups/Clinician Recommendations: Please discuss with your provider about getting your Shingles and tetanus vaccine next time in the office.   This is a list of the screening recommended for you and due dates:  Health Maintenance  Topic Date Due   DTaP/Tdap/Td vaccine (1 - Tdap) Never done   DEXA scan (bone density measurement)  Never done   Eye exam for diabetics  11/21/2023   Zoster (Shingles) Vaccine (1 of 2) 01/28/2024*   Mammogram  10/30/2024*   COVID-19 Vaccine (3 - Pfizer risk series) 12/11/2024*   Yearly kidney health urinalysis for diabetes  02/28/2024   Hemoglobin A1C  04/27/2024   Yearly kidney function blood test for diabetes  10/28/2024   Complete foot exam   10/30/2024   Medicare Annual Wellness Visit  11/25/2024   Colon Cancer Screening  09/01/2028   Pneumonia Vaccine  Completed   Flu Shot  Completed   Hepatitis C Screening  Completed   HPV Vaccine  Aged Out  *Topic was postponed. The date shown is not the original due date.    Advanced directives: (Copy Requested) Please bring a copy of your health care power of attorney and living will to the office to be added to your chart at your convenience. You can mail to [facility] [address] [place], [place] [address] or email to [email or web address]  Next Medicare Annual Wellness Visit scheduled for next year: Yes

## 2023-11-26 NOTE — Progress Notes (Signed)
 Subjective:   Christine Robertson is a 68 y.o. who presents for a Medicare Wellness preventive visit.  Visit Complete: Virtual I connected with  Christine Robertson on 11/26/23 by a audio enabled telemedicine application and verified that I am speaking with the correct person using two identifiers.  Patient Location: Home  Provider Location: Home Office  I discussed the limitations of evaluation and management by telemedicine. The patient expressed understanding and agreed to proceed.  Vital Signs: Because this visit was a virtual/telehealth visit, some criteria may be missing or patient reported. Any vitals not documented were not able to be obtained and vitals that have been documented are patient reported.  VideoDeclined- This patient declined Librarian, academic. Therefore the visit was completed with audio only.  Persons Participating in Visit: Patient.  AWV Questionnaire: No: Patient Medicare AWV questionnaire was not completed prior to this visit.  Cardiac Risk Factors include: advanced age (>20men, >78 women);hypertension;obesity (BMI >30kg/m2)     Objective:    Today's Vitals   11/22/23 0846 11/26/23 1412  BP:  136/72  Pulse:  88  Weight:  279 lb (126.6 kg)  Height:  5\' 3"  (1.6 m)  PainSc: 4     Body mass index is 49.42 kg/m.     11/26/2023    2:22 PM 09/02/2023    8:22 AM 10/15/2022    1:05 PM 04/24/2022    8:46 AM 04/10/2022    9:33 AM 09/21/2021    9:49 AM 08/08/2020    1:36 PM  Advanced Directives  Does Patient Have a Medical Advance Directive? Yes Yes No No No No No  Type of Estate agent of [place];Living will Healthcare Power of Attorney       Copy of Healthcare Power of Attorney in Chart? Yes - validated most recent copy scanned in chart (See row information) No - copy requested       Would patient like information on creating a medical advance directive?   No - Patient declined  Yes (MAU/Ambulatory/Procedural  Areas - Information given) No - Patient declined     Current Medications (verified) Outpatient Encounter Medications as of 11/26/2023  Medication Sig   aspirin 81 MG tablet Take 81 mg by mouth daily.   cetirizine (ZYRTEC) 10 MG tablet Take 10 mg by mouth daily.   empagliflozin (JARDIANCE) 25 MG TABS tablet Take 1 tablet (25 mg total) by mouth daily.   hydrochlorothiazide (HYDRODIURIL) 25 MG tablet Take 1 tablet (25 mg total) by mouth daily.   insulin degludec (TRESIBA FLEXTOUCH) 100 UNIT/ML FlexTouch Pen INJECT 38 UNITS INTO THE SKIN DAILY. TITRATE AS INSTRUCTED TO 50 UNITS DAILY   Insulin Pen Needle (DROPLET PEN NEEDLES) 32G X 4 MM MISC USE TO INJECT INSULIN DAILY   lisinopril (ZESTRIL) 20 MG tablet Take 1 tablet (20 mg total) by mouth daily.   metFORMIN (GLUCOPHAGE-XR) 500 MG 24 hr tablet Take 2 tablets (1,000 mg total) by mouth 2 (two) times daily.   methylcellulose oral powder Take by mouth daily.   metoprolol tartrate (LOPRESSOR) 25 MG tablet TAKE 1 TABLET BY MOUTH 2 TIMES A DAY   Multiple Vitamin (MULTIVITAMIN) tablet Take 1 tablet by mouth daily.   omeprazole (PRILOSEC) 20 MG capsule TAKE 1 CAPSULE BY MOUTH TWICE DAILY (Patient taking differently: Take 20 mg by mouth daily.)   oxybutynin (DITROPAN) 5 MG tablet TAKE 1 TABLET BY MOUTH 2 TIMES A DAY   rosuvastatin (CRESTOR) 10 MG tablet Take 1 tablet (  10 mg total) by mouth daily.   No facility-administered encounter medications on file as of 11/26/2023.    Allergies (verified) Erythromycin, Augmentin [amoxicillin-pot clavulanate], and Demerol [meperidine]   History: Past Medical History:  Diagnosis Date   Abnormal results of thyroid function studies    Allergy    hay fever   Angina pectoris, unspecified (HCC)    Arthritis    Atrial fibrillation, unspecified    Chicken pox    Cold hands    Congenital anomaly of adrenal gland    Diabetes mellitus    type 2, uncomplicated   Essential hypertension, benign    Family history of  adverse reaction to anesthesia    Mother - PONV   GERD (gastroesophageal reflux disease)    IBS (irritable bowel syndrome)    Malaise    Morbid obesity (HCC)    Murmur    Pain in soft tissues of limb    Proteinuria    Pure hypercholesterolemia    Rash and other nonspecific skin eruption    Sleep apnea    Obstructive; Uses C-Pap machine   Urinary incontinence    Vertigo    1 episode, approx 2013   Wears contact lenses    left eye   Past Surgical History:  Procedure Laterality Date   ABDOMINAL HYSTERECTOMY  2010   BLADDER SURGERY  1967   stretching of bladder   BREAST BIOPSY Left 03/28/2015   done at Dr. Rutherford Nail office. fibrocystic changes   CATARACT EXTRACTION W/PHACO Right 04/10/2022   Procedure: CATARACT EXTRACTION PHACO AND INTRAOCULAR LENS PLACEMENT (IOC) RIGHT DIABETIC VIVITY LENS;  Surgeon: Galen Manila, MD;  Location: [facility];  Service: Ophthalmology;  Laterality: Right;  6.89 0:40.8   CATARACT EXTRACTION W/PHACO Left 04/24/2022   Procedure: CATARACT EXTRACTION PHACO AND INTRAOCULAR LENS PLACEMENT (IOC) LEFT DIABETIC VIVITY  toric LENS 5.29 00:41.8;  Surgeon: Galen Manila, MD;  Location: [facility];  Service: Ophthalmology;  Laterality: Left;  Diabetic   COLONOSCOPY     COLONOSCOPY WITH PROPOFOL N/A 09/09/2015   Procedure: COLONOSCOPY WITH PROPOFOL;  Surgeon: Scot Jun, MD;  Location: [facility];  Service: Endoscopy;  Laterality: N/A;   COLONOSCOPY WITH PROPOFOL N/A 09/02/2023   Procedure: COLONOSCOPY WITH PROPOFOL;  Surgeon: Regis Bill, MD;  Location: [facility];  Service: Endoscopy;  Laterality: N/A;   ESOPHAGOGASTRODUODENOSCOPY     EYE SURGERY     TONSILLECTOMY AND ADENOIDECTOMY  1959   URETHRAL DILATION     Family History  Problem Relation Age of Onset   Diabetes Mother    Cancer Father        lung   Cerebral aneurysm Brother 82       s/p rupture   Breast cancer Maternal Grandmother 52   Breast cancer  Maternal Aunt 109   Social History   Socioeconomic History   Marital status: Married    Spouse name: Not on file   Number of children: Not on file   Years of education: Not on file   Highest education level: 12th grade  Occupational History   Not on file  Tobacco Use   Smoking status: Never   Smokeless tobacco: Never  Vaping Use   Vaping status: Never Used  Substance and Sexual Activity   Alcohol use: No    Alcohol/week: 0.0 standard drinks of alcohol   Drug use: No   Sexual activity: Not on file  Other Topics Concern   Not on file  Social History Narrative  Not on file   Social Drivers of Health   Financial Resource Strain: Low Risk  (11/26/2023)   Overall Financial Resource Strain (CARDIA)    Difficulty of Paying Living Expenses: Not hard at all  Food Insecurity: No Food Insecurity (11/26/2023)   Hunger Vital Sign    Worried About Running Out of Food in the Last Year: Never true    Ran Out of Food in the Last Year: Never true  Transportation Needs: No Transportation Needs (11/26/2023)   PRAPARE - Administrator, Civil Service (Medical): No    Lack of Transportation (Non-Medical): No  Physical Activity: Inactive (11/26/2023)   Exercise Vital Sign    Days of Exercise per Week: 0 days    Minutes of Exercise per Session: 20 min  Stress: No Stress Concern Present (11/26/2023)   [employer] of Occupational Health - Occupational Stress Questionnaire    Feeling of Stress : Not at all  Social Connections: Socially Integrated (11/26/2023)   Social Connection and Isolation Panel [NHANES]    Frequency of Communication with Friends and Family: Three times a week    Frequency of Social Gatherings with Friends and Family: Three times a week    Attends Religious Services: 1 to 4 times per year    Active Member of Clubs or Organizations: Yes    Attends Engineer, structural: More than 4 times per year    Marital Status: Married    Tobacco  Counseling Counseling given: Yes    Clinical Intake:  Pre-visit preparation completed: Yes  Pain : 0-10 (some leg pain) Pain Score: 4  Pain Type: Chronic pain Pain Location: Leg (back /leg pain) Pain Descriptors / Indicators: Aching Pain Onset: Other (comment) (chronic) Pain Frequency: Constant Pain Relieving Factors: ibuprofen Effect of Pain on Daily Activities: no  Pain Relieving Factors: ibuprofen  BMI - recorded: 49.42 Nutritional Risks: None Diabetes: Yes CBG done?: No (109)  Lab Results  Component Value Date   HGBA1C 8.2 (H) 10/29/2023   HGBA1C 7.4 (H) 07/09/2023   HGBA1C 7.5 (H) 02/28/2023     How often do you need to have someone help you when you read instructions, pamphlets, or other written materials from your doctor or pharmacy?: 1 - Never     Information entered by :: Alia T/CMA   Activities of Daily Living     11/26/2023    2:19 PM 11/22/2023    8:46 AM  In your present state of health, do you have any difficulty performing the following activities:  Hearing? 0 0  Vision? 0 0  Difficulty concentrating or making decisions? 0 0  Walking or climbing stairs? 1 1  Dressing or bathing? 0 0  Doing errands, shopping? 0 0  Preparing Food and eating ? N N  Using the Toilet? N N  In the past six months, have you accidently leaked urine? Y Y  Do you have problems with loss of bowel control? N Y  Managing your Medications? N N  Managing your Finances? N N  Housekeeping or managing your Housekeeping? N N    Patient Care Team: Dale Alex, MD as PCP - General (Internal Medicine) Dale [place], MD (Internal Medicine) Lemar Livings, Merrily Pew, MD (General Surgery) Loree Fee, [facility] (Pharmacist)  Indicate any recent Medical Services you may have received from other than [facility] providers in the past year (date may be approximate).     Assessment:   This is a routine wellness examination for Jaleeya.  Hearing/Vision  screen Hearing Screening -  Comments:: Pt denies hear def Vision Screening - Comments:: Pt denies vision def  Pt [facility] Dr. Melanie Crazier   Goals Addressed               This Visit's Progress     Increase physical activity (pt-stated)   On track     Healthy diet. Weight loss.      Patient Stated        Try to take more control on diabetes management       Depression Screen     11/26/2023    2:14 PM 03/20/2023   10:43 AM 10/15/2022    1:25 PM 03/22/2022    2:37 PM 02/08/2022    3:44 PM 09/21/2021    9:53 AM 08/08/2021    3:53 PM  PHQ 2/9 Scores  PHQ - 2 Score 0 0 0 0 0 0 0  PHQ- 9 Score 1          Fall Risk     11/26/2023    2:14 PM 11/22/2023    8:46 AM 10/14/2023    3:00 PM 03/20/2023   10:43 AM 10/15/2022    1:06 PM  Fall Risk   Falls in the past year? 0 0 0 0 0  Number falls in past yr: 0   0 0  Injury with Fall? 0   0 0  Risk for fall due to : No Fall Risks   No Fall Risks --  Risk for fall due to: Comment     rollator in use  Follow up Falls prevention discussed;Falls evaluation completed   Falls evaluation completed Falls evaluation completed;Falls prevention discussed    MEDICARE RISK AT HOME:  Medicare Risk at Home Any stairs in or around the home?: No If so, are there any without handrails?: No Home free of loose throw rugs in walkways, pet beds, electrical cords, etc?: Yes Adequate lighting in your home to reduce risk of falls?: Yes Life alert?: No Use of a cane, walker or w/c?: Yes Grab bars in the bathroom?: No Shower chair or bench in shower?: Yes Elevated toilet seat or a handicapped toilet?: No  TIMED UP AND GO:  Was the test performed?  No  Cognitive Function: 6CIT completed        11/26/2023    2:26 PM 10/15/2022    1:15 PM 09/21/2021   10:16 AM  6CIT Screen  What Year? 0 points 0 points 0 points  What month? 0 points 0 points 0 points  What time? 0 points 0 points 0 points  Count back from 20 0 points 0 points 0 points  Months in reverse 0 points 0 points 0  points  Repeat phrase 0 points 0 points 0 points  Total Score 0 points 0 points 0 points    Immunizations Immunization History  Administered Date(s) Administered   Fluad Quad(high Dose 65+) 05/10/2020, 06/05/2021, 06/22/2022   Fluad Trivalent(High Dose 65+) 07/11/2023   Influenza Split 06/06/2012, 06/03/2014   Influenza,inj,Quad PF,6+ Mos 06/23/2013, 05/18/2015, 07/16/2016, 07/03/2017   PFIZER(Purple Top)SARS-COV-2 Vaccination 10/16/2019, 11/11/2019   PNEUMOCOCCAL CONJUGATE-20 03/02/2021   Pneumococcal Polysaccharide-23 08/31/2016   Zoster, Live 06/01/2015    Screening Tests Health Maintenance  Topic Date Due   DTaP/Tdap/Td (1 - Tdap) Never done   DEXA SCAN  Never done   OPHTHALMOLOGY EXAM  11/21/2023   Zoster Vaccines- Shingrix (1 of 2) 01/28/2024 (Originally 04/08/1975)   MAMMOGRAM  10/30/2024 (Originally 08/03/2023)   COVID-19  Vaccine (3 - Pfizer risk series) 12/11/2024 (Originally 12/09/2019)   Diabetic kidney evaluation - Urine ACR  02/28/2024   HEMOGLOBIN A1C  04/27/2024   Diabetic kidney evaluation - eGFR measurement  10/28/2024   FOOT EXAM  10/30/2024   Medicare Annual Wellness (AWV)  11/25/2024   Colonoscopy  09/01/2028   Pneumonia Vaccine 38+ Years old  Completed   INFLUENZA VACCINE  Completed   Hepatitis C Screening  Completed   HPV VACCINES  Aged Out    Health Maintenance  Health Maintenance Due  Topic Date Due   DTaP/Tdap/Td (1 - Tdap) Never done   DEXA SCAN  Never done   OPHTHALMOLOGY EXAM  11/21/2023   Health Maintenance Items Addressed: See Nurse Notes  Additional Screening:  Vision Screening: Recommended annual ophthalmology exams for early detection of glaucoma and other disorders of the eye.  Dental Screening: Recommended annual dental exams for proper oral hygiene  Community Resource Referral / Chronic Care Management: CRR required this visit?  No   CCM required this visit?  No     Plan:     I have personally reviewed and noted the  following in the patient's chart:   Medical and social history Use of alcohol, tobacco or illicit drugs  Current medications and supplements including opioid prescriptions. Patient is not currently taking opioid prescriptions. Functional ability and status Nutritional status Physical activity Advanced directives List of other physicians Hospitalizations, surgeries, and ER visits in previous 12 months Vitals Screenings to include cognitive, depression, and falls Referrals and appointments  In addition, I have reviewed and discussed with patient certain preventive protocols, quality metrics, and best practice recommendations. A written personalized care plan for preventive services as well as general preventive health recommendations were provided to patient.     Arta Silence, CMA   11/26/2023   After Visit Summary: (MyChart) Due to this being a telephonic visit, the after visit summary with patients personalized plan was offered to patient via MyChart   Notes:  Pt is due Dexa need to re-ordered, expires 4/25, pt also is aware that she needs Shingles/Tetanus vaccine

## 2023-12-02 ENCOUNTER — Telehealth: Payer: Self-pay

## 2023-12-02 NOTE — Telephone Encounter (Signed)
 PA request has been Submitted. New Encounter has been or will be created for follow up. For additional info see Pharmacy Prior Auth telephone encounter from 12/02/23.

## 2023-12-02 NOTE — Telephone Encounter (Signed)
 Patient needs a prior [place] for [place]

## 2023-12-02 NOTE — Telephone Encounter (Signed)
 Pharmacy Patient Advocate Encounter   Received notification from Pt Calls Messages that prior authorization for Tresiba FlexTouch (insulin degludec injection) 100 Units/mL solution is required/requested.   Insurance verification completed.   The patient is insured through [facility] .   Per test claim: PA required; PA submitted to above mentioned insurance via CoverMyMeds Key/confirmation #/EOC [id number] Status is pending

## 2023-12-02 NOTE — Telephone Encounter (Signed)
 Copied from CRM [id number]. Topic: Clinical - Prescription Issue >> Nov 25, 2023 10:39 AM Adele Barthel wrote: Reason for CRM:   Calling in regarding her TRESIBA FLEXTOUCH 100 UNIT/ML FlexTouch Pen refill request. Advised refills were sent over on 03/18 to the [facility] of file, which she confirmed is correct pharmacy. However, she contacted pharmacy this morning and they are reporting they havent received it. Requested the refills be sent over again to [facility]  CB#   [phone] >> Dec 02, 2023 10:35 AM Truddie Crumble wrote: Patient called stating the insurance is denying her medication and [facility] stated they need a prior authorization for tresiba. Patient called stating [facility] sent a fax over on Saturday and patient would like to be called

## 2023-12-03 ENCOUNTER — Other Ambulatory Visit (INDEPENDENT_AMBULATORY_CARE_PROVIDER_SITE_OTHER): Admitting: Pharmacist

## 2023-12-03 ENCOUNTER — Other Ambulatory Visit (HOSPITAL_COMMUNITY): Payer: Self-pay

## 2023-12-03 ENCOUNTER — Encounter: Payer: Self-pay | Admitting: Pharmacist

## 2023-12-03 DIAGNOSIS — Z794 Long term (current) use of insulin: Secondary | ICD-10-CM

## 2023-12-03 DIAGNOSIS — E119 Type 2 diabetes mellitus without complications: Secondary | ICD-10-CM

## 2023-12-03 MED ORDER — BASAGLAR KWIKPEN 100 UNIT/ML ~~LOC~~ SOPN: 46.0000 [IU] | PEN_INJECTOR | Freq: Every day | SUBCUTANEOUS | 3 refills | Status: DC

## 2023-12-03 NOTE — Telephone Encounter (Signed)
 She has been on tresiba. Apparently there is a change in her insulin coverage. If tresiba is not covered and basaglar is, then will need to change insulin. (Both are long acting insulins). Need to confirm dose if tresiba she is taking and let her know that insurance is no longer covering.

## 2023-12-03 NOTE — Telephone Encounter (Signed)
 Patient is taking 46 units per day.

## 2023-12-03 NOTE — Progress Notes (Signed)
 Patient Assistance Program (PAP) Application   Manufacturer: [employer]    (New enrollment) Medication(s): [name] U100, NovoFine Needles  Patient Portion of Application:  12/03/23: Completed with patient via online enrollment tool. Submitted.  Income Documentation: N/A - Electronic verification elected.  Provider Portion of Application:  12/03/23: Completed with provider consent via online re-enrollment portal. Prescription(s): Included in MAP application. Tresiba U100. Inject 45-50 units once daily as instructed (taking 46 units currently) Novofine needles #100  Application Status: Submitted via online enrollment (pending approval) submitted 12/03/23

## 2023-12-03 NOTE — Telephone Encounter (Signed)
 Pharmacy Patient Advocate Encounter  Received notification from [facility] that Prior Authorization for [facility] Soln Pen-inj has been DENIED.  Full denial letter will be uploaded to the media tab. See denial reason below.   PA #/Case ID/Reference #:  [id number]

## 2023-12-03 NOTE — Patient Instructions (Addendum)
 Ms. KELVIN BURPEE,   It was a pleasure to speak with you today! As we discussed:?   Your blood sugar on your most recent [place] sensor report has looked very good. Great work! Please continue all medications as you have been taking them.  We have sent a refill of insulin (Basaglar) which you will use in place of the [place]. Both insulins work in the same way. We discussed continuing your current dose of 46 units.  Continue to keep up the hard work surrounding diet and exercise!  You will receive applications in the mail for yourself and for Mr. Klindt for both [place] Pepper and Farxiga Marcelline Deist will not be submitted to the program unless your income is too high to qualify for the [place] program) Jardiance DOES require proof of income (social security benefits letter for you and your husband).  Ozempic and [place] application do not require these documents.   We will work on Passenger transport manager for the following medication assistance programs:   [employer] Patient Assistance Foundation [facility]) Medication: [employer] [address], [place], [place] [address]  Phone: [phone] M-F 8am-8pm ET    Fax: [phone]  **This program we can apply online. There is nothing further you have to do. You will receive a letter in the mail once your application has been processed.   [employer] Patient Assistance Program Medication: Felipa Evener [address] [address]  [place], [place] [address]  Phone: [phone]  Fax: [phone]  **This program is the stricter one. The income limit for 2 people is around $50,000/year.  **This program also requires proof of income documents (this would be your social security benefit letter for you and your husband)  Music therapist ([place] and [place]) Medication: Marcelline Deist (if you are not approved for Jardiance) Phone: [phone] M-F 9am-6pm ET    Fax: [phone] **If your income is above the cutoff for Jardiance, this program has a higher income cutoff and  they do not require proof of income documents.    For any questions after your application has been submitted,  Please contact the program directly by phone. To Check your application status For Questions regarding your medication shipment or delivery details To see when your next medication refill will be shipped out To request medication refills  Future Appointments  Date Time Provider Department Center  01/02/2024  2:30 PM Dale , MD [facility]  02/04/2024  2:30 PM [facility] PHARMACIST [facility]  11/25/2024  3:00 PM [facility] ANNUAL WELLNESS VISIT [facility]   Berenice Primas, PharmD - Clinical Pharmacist

## 2023-12-03 NOTE — Progress Notes (Unsigned)
 12/03/2023 Name: Christine Robertson MRN: [id number] DOB: [birth year]  Chief Complaint  Patient presents with   Diabetes   Christine Robertson is a 68 y.o. year old female who presented for a telephone visit.   They were referred to the pharmacist by their PCP for assistance in managing diabetes.   Pertinent PMH: HTN, AF, OSA, DM2, GERD, Hx UTI, HLD  Subjective:  Care Team: Primary Care Provider: Dale [place], MD  Medication Access/Adherence Current Pharmacy:  [facility] [id number] [place], [place] - [address] [place] [address] Phone: [phone] Fax: [phone]  Patient reports affordability concerns with their medications: Yes  Daughter purchases Jardiance 25 mg tablet "over seas" when she was abroad (travels regularly). Previously given BI cares application though husband planned to start new job so it was not completed.  At this time, husband has officially retired. Income via household of 2 consists of social security retirement only.    Patient reports access/transportation concerns to their pharmacy: No  Patient reports adherence concerns with their medications:  No    Diabetes: Since last visit, patient reports doing well overall. Ran out of [place] 2-3 days ago and has not been using insulin. Prior auth revealed basaglar is preferred this year on her insurance formulary.   Also ran out of [place] sensors, used her last a couple days ago. Has not heard from DME and plans to call them this week. (Covered in full through Medicare B + supplement).   Current medications:  Tresiba 46 units daily (takes in the morning) Metformin XR 1000 mg twice daily Jardiance 25 mg daily  Medications tried in past: GLP1: Stomach upset on higher doses of Ozempic - Noted by GI that she could re-try incretin therapy. Previously over income limit for GLP1 MAP.   SMBG:   Patient denies hypoglycemic s/sx. No readings <70 mg/dL. Lowest 90 mg/dL Patient  denies hyperglycemic symptoms.  Diet:  Feels very well informed on her dietary choices based on her ongoing use of Libre device. She is a lot more confident with her choices regarding how her sugars will respond to certain foods. Feels in control of her diabetes management.   Breakfast: Eggs, oatmeal or 1 slice whole wheat toast Lunch:  Sandwich Dinner: Fast food ~3 times week. Meat (hamburger, chick), vegetable (green beans, frozen mix), sometimes potatoes Beverages: Diet coke and water  Current physical activity:  Walks around house with walker, as the cement flooring hurts her knees. Otherwise, ambulates without assistance device.   Objective: Lab Results  Component Value Date   HGBA1C 8.2 (H) 10/29/2023   Lab Results  Component Value Date   CREATININE 1.00 10/29/2023   BUN 30 (H) 10/29/2023   NA 140 10/29/2023   K 4.0 10/29/2023   CL 102 10/29/2023   CO2 28 10/29/2023   Lab Results  Component Value Date   CHOL 120 10/29/2023   HDL 52.40 10/29/2023   LDLCALC 43 10/29/2023   LDLDIRECT 58.0 06/01/2021   TRIG 124.0 10/29/2023   CHOLHDL 2 10/29/2023   Medications Reviewed Today     Reviewed by Loree Fee, RPH (Pharmacist) on 12/03/23 at 1500  Med List Status: <None>   Medication Order Taking? Sig Documenting Provider Last Dose Status Informant  aspirin 81 MG tablet [id number] No Take 81 mg by mouth daily. [provider] Taking Active Self  cetirizine (ZYRTEC) 10 MG tablet [id number] No Take 10 mg by mouth daily. [provider] Taking  Active Self  empagliflozin (JARDIANCE) 25 MG TABS tablet [id number] No Take 1 tablet (25 mg total) by mouth daily. Dale [place], MD Taking Active   hydrochlorothiazide (HYDRODIURIL) 25 MG tablet [id number] No Take 1 tablet (25 mg total) by mouth daily. Dale [place], MD Taking Active   Discontinued 12/03/23 1500 (Not covered by the pt's insurance)            Med Note>> Loree Fee, [facility]   12/03/2023  3:00 PM Switch to  Basaglar    Insulin Pen Needle (DROPLET PEN NEEDLES) 32G X 4 MM MISC [id number] No USE TO INJECT INSULIN DAILY Dale [place], MD Taking Active   lisinopril (ZESTRIL) 20 MG tablet [id number] No Take 1 tablet (20 mg total) by mouth daily. Dale [place], MD Taking Active   metFORMIN (GLUCOPHAGE-XR) 500 MG 24 hr tablet [id number] No Take 2 tablets (1,000 mg total) by mouth 2 (two) times daily. Dale [place], MD Taking Active   methylcellulose oral powder [id number] No Take by mouth daily. [provider] Taking Active   metoprolol tartrate (LOPRESSOR) 25 MG tablet [id number] No TAKE 1 TABLET BY MOUTH 2 TIMES A Donne Anon, MD Taking Active   Multiple Vitamin (MULTIVITAMIN) tablet [id number] No Take 1 tablet by mouth daily. [provider] Taking Active Self  omeprazole (PRILOSEC) 20 MG capsule [id number] No TAKE 1 CAPSULE BY MOUTH TWICE DAILY  Patient taking differently: Take 20 mg by mouth daily.   Dale [place], MD Taking Active            Med Note Tyrone Nine Nov 19, 2019 11:08 AM) Once daily   oxybutynin (DITROPAN) 5 MG tablet [id number] No TAKE 1 TABLET BY MOUTH 2 TIMES A Donne Anon, MD Taking Active   rosuvastatin (CRESTOR) 10 MG tablet [id number] No Take 1 tablet (10 mg total) by mouth daily. Dale [place], MD Taking Active            Assessment/Plan:   Diabetes: Uncontrolled per A1c 8.2% (10/29/23), worsened from previous 7.2%. However, since last PCP visit, glycemic control has improved on CGM report with GMI 6.7%. 88% TIR, 11% above, 1% high. No c/f lows. Since husband has retired, patient feels she may be eligible for PAP which she deferred last year due to husband looking for new job.  Tresiba 46 units daily ? Basaglar 46 units daily (per insurance coverage) [employer] application initiated for [employer] insulin/pen needles Continue metformin XR 1000 mg twice daily Continue Jardiance 25 mg daily BI Cares application started for  [employer].  Patient will sign [place] Marcelline Deist) as back up option if denied for BI Cares Future Consideration: GLP1-RA: Previously did not qualify for Ozempic MAP due to exceeding income requirement. Tolerated Ozempic previously, some stomach upset on higher doses - Noted by GI that she could re-try incretin therapy. Consider if approved for [employer].     Follow Up Plan:  PCP as scheduled in 1 months PharmD follow up as needed ~1 month after PCP visit  Future Appointments  Date Time Provider Department Center  01/02/2024  2:30 PM Dale , MD [facility]  02/04/2024  2:30 PM [facility] PHARMACIST [facility]  11/25/2024  3:00 PM [facility] ANNUAL WELLNESS VISIT [facility]   Loree Fee, PharmD Clinical Pharmacist [facility] [phone]

## 2023-12-03 NOTE — Telephone Encounter (Signed)
 In reviewing, Christine Robertson changed her insulin to basaglar. I signed the rx. Please confirm pt is aware and notify - she is to change tresiba to basaglar.

## 2023-12-04 ENCOUNTER — Other Ambulatory Visit: Payer: Self-pay | Admitting: Internal Medicine

## 2023-12-04 NOTE — Telephone Encounter (Signed)
 Patient is aware

## 2023-12-17 ENCOUNTER — Other Ambulatory Visit: Payer: Self-pay | Admitting: Internal Medicine

## 2023-12-19 ENCOUNTER — Telehealth: Payer: Self-pay

## 2023-12-19 NOTE — Telephone Encounter (Signed)
 Pt came in to office to pick up [place] and needles on 12/19/23 @ 4:30 pm

## 2023-12-19 NOTE — Telephone Encounter (Signed)
 received 4 boxes of Tresiba and 2 boxes of pen needles at our office. PAP shipment. Pt is aware and says she will pick up this PM.

## 2024-01-02 ENCOUNTER — Ambulatory Visit (INDEPENDENT_AMBULATORY_CARE_PROVIDER_SITE_OTHER): Payer: Medicare Other | Admitting: Internal Medicine

## 2024-01-02 VITALS — BP 128/70 | HR 85 | Temp 98.0°F | Resp 16 | Ht 63.0 in | Wt 277.0 lb

## 2024-01-02 DIAGNOSIS — I4891 Unspecified atrial fibrillation: Secondary | ICD-10-CM

## 2024-01-02 DIAGNOSIS — E1165 Type 2 diabetes mellitus with hyperglycemia: Secondary | ICD-10-CM

## 2024-01-02 DIAGNOSIS — G4733 Obstructive sleep apnea (adult) (pediatric): Secondary | ICD-10-CM

## 2024-01-02 DIAGNOSIS — I10 Essential (primary) hypertension: Secondary | ICD-10-CM | POA: Diagnosis not present

## 2024-01-02 DIAGNOSIS — Z7984 Long term (current) use of oral hypoglycemic drugs: Secondary | ICD-10-CM

## 2024-01-02 DIAGNOSIS — I890 Lymphedema, not elsewhere classified: Secondary | ICD-10-CM

## 2024-01-02 DIAGNOSIS — K219 Gastro-esophageal reflux disease without esophagitis: Secondary | ICD-10-CM

## 2024-01-02 DIAGNOSIS — E7800 Pure hypercholesterolemia, unspecified: Secondary | ICD-10-CM

## 2024-01-02 DIAGNOSIS — M25561 Pain in right knee: Secondary | ICD-10-CM | POA: Diagnosis not present

## 2024-01-02 DIAGNOSIS — D649 Anemia, unspecified: Secondary | ICD-10-CM

## 2024-01-02 NOTE — Progress Notes (Unsigned)
 Subjective:    Patient ID: Christine Robertson, female    DOB: [birth year], 68 y.o.   MRN: [id number]  Patient here for  Chief Complaint  Patient presents with   Medical Management of Chronic Issues    HPI Here for a scheduled follow up - follow up regarding diabetes, paroxysmal afib - maintained on metoprolol  and flecainide, hypercholesterolemia and OSA. Reviewed recent blood sugars - CGM report - from 12/20/23 - 01/02/24. Time CGM active 83% - target range 79%, high 20% and very high 1%. Discussed continued low carb diet and exercise. Overall improving. No chest pain reported. Breathing stable. Does report having issues with her right knee. Worsening over the last few weeks. Noticed - popping of her knee previously. Using rollator - to help take pressure off. Taking ibuprofen prn. Discussed ortho referral. Bending aggravates.    Past Medical History:  Diagnosis Date   Abnormal results of thyroid  function studies    Allergy    hay fever   Angina pectoris, unspecified (HCC)    Arthritis    Atrial fibrillation, unspecified    Chicken pox    Cold hands    Congenital anomaly of adrenal gland    Diabetes mellitus    type 2, uncomplicated   Essential hypertension, benign    Family history of adverse reaction to anesthesia    Mother - PONV   GERD (gastroesophageal reflux disease)    IBS (irritable bowel syndrome)    Malaise    Morbid obesity (HCC)    Murmur    Pain in soft tissues of limb    Proteinuria    Pure hypercholesterolemia    Rash and other nonspecific skin eruption    Sleep apnea    Obstructive; Uses C-Pap machine   Urinary incontinence    Vertigo    1 episode, approx 2013   Wears contact lenses    left eye   Past Surgical History:  Procedure Laterality Date   ABDOMINAL HYSTERECTOMY  2010   BLADDER SURGERY  1967   stretching of bladder   BREAST BIOPSY Left 03/28/2015   done at Dr. Butch Cashing office. fibrocystic changes   CATARACT EXTRACTION W/PHACO Right 04/10/2022    Procedure: CATARACT EXTRACTION PHACO AND INTRAOCULAR LENS PLACEMENT (IOC) RIGHT DIABETIC VIVITY LENS;  Surgeon: Clair Crews, MD;  Location: [facility];  Service: Ophthalmology;  Laterality: Right;  6.89 0:40.8   CATARACT EXTRACTION W/PHACO Left 04/24/2022   Procedure: CATARACT EXTRACTION PHACO AND INTRAOCULAR LENS PLACEMENT (IOC) LEFT DIABETIC VIVITY  toric LENS 5.29 00:41.8;  Surgeon: Clair Crews, MD;  Location: [facility];  Service: Ophthalmology;  Laterality: Left;  Diabetic   COLONOSCOPY     COLONOSCOPY WITH PROPOFOL  N/A 09/09/2015   Procedure: COLONOSCOPY WITH PROPOFOL ;  Surgeon: Cassie Click, MD;  Location: [facility];  Service: Endoscopy;  Laterality: N/A;   COLONOSCOPY WITH PROPOFOL  N/A 09/02/2023   Procedure: COLONOSCOPY WITH PROPOFOL ;  Surgeon: Shane Darling, MD;  Location: [facility] ENDOSCOPY;  Service: Endoscopy;  Laterality: N/A;   ESOPHAGOGASTRODUODENOSCOPY     EYE SURGERY     TONSILLECTOMY AND ADENOIDECTOMY  1959   URETHRAL DILATION     Family History  Problem Relation Age of Onset   Diabetes Mother    Cancer Father        lung   Cerebral aneurysm Brother 11       s/p rupture   Breast cancer Maternal Grandmother 52   Breast cancer Maternal Aunt 29   Social History  Socioeconomic History   Marital status: Married    Spouse name: Not on file   Number of children: Not on file   Years of education: Not on file   Highest education level: 12th grade  Occupational History   Not on file  Tobacco Use   Smoking status: Never   Smokeless tobacco: Never  Vaping Use   Vaping status: Never Used  Substance and Sexual Activity   Alcohol use: No    Alcohol/week: 0.0 standard drinks of alcohol   Drug use: No   Sexual activity: Not on file  Other Topics Concern   Not on file  Social History Narrative   Not on file   Social Drivers of Health   Financial Resource Strain: Low Risk  (11/26/2023)   Overall Financial Resource Strain  (CARDIA)    Difficulty of Paying Living Expenses: Not hard at all  Food Insecurity: No Food Insecurity (11/26/2023)   Hunger Vital Sign    Worried About Running Out of Food in the Last Year: Never true    Ran Out of Food in the Last Year: Never true  Transportation Needs: No Transportation Needs (11/26/2023)   PRAPARE - Administrator, Civil Service (Medical): No    Lack of Transportation (Non-Medical): No  Physical Activity: Inactive (11/26/2023)   Exercise Vital Sign    Days of Exercise per Week: 0 days    Minutes of Exercise per Session: 20 min  Stress: No Stress Concern Present (11/26/2023)   [employer] of Occupational Health - Occupational Stress Questionnaire    Feeling of Stress : Not at all  Social Connections: Socially Integrated (11/26/2023)   Social Connection and Isolation Panel [NHANES]    Frequency of Communication with Friends and Family: Three times a week    Frequency of Social Gatherings with Friends and Family: Three times a week    Attends Religious Services: 1 to 4 times per year    Active Member of Clubs or Organizations: Yes    Attends Engineer, structural: More than 4 times per year    Marital Status: Married     Review of Systems  Constitutional:  Negative for appetite change and unexpected weight change.  HENT:  Negative for congestion and sinus pressure.   Respiratory:  Negative for cough, chest tightness and shortness of breath.   Cardiovascular:  Negative for chest pain and palpitations.  Gastrointestinal:  Negative for abdominal pain, diarrhea, nausea and vomiting.  Genitourinary:  Negative for difficulty urinating and dysuria.  Musculoskeletal:  Negative for myalgias.       Knee pain as outlined.   Skin:  Negative for color change and rash.  Neurological:  Negative for dizziness and headaches.  Psychiatric/Behavioral:  Negative for agitation and dysphoric mood.        Objective:     BP 128/70   Pulse 85   Temp 98 F  (36.7 C)   Resp 16   Ht 5\' 3"  (1.6 m)   Wt 277 lb (125.6 kg)   SpO2 98%   BMI 49.07 kg/m  Wt Readings from Last 3 Encounters:  01/02/24 277 lb (125.6 kg)  11/26/23 279 lb (126.6 kg)  10/31/23 279 lb (126.6 kg)    Physical Exam      Outpatient Encounter Medications as of 01/02/2024  Medication Sig   aspirin 81 MG tablet Take 81 mg by mouth daily.   cetirizine (ZYRTEC) 10 MG tablet Take 10 mg by mouth daily.  empagliflozin  (JARDIANCE ) 25 MG TABS tablet Take 1 tablet (25 mg total) by mouth daily.   hydrochlorothiazide  (HYDRODIURIL ) 25 MG tablet Take 1 tablet (25 mg total) by mouth daily.   Insulin Glargine  (BASAGLAR  KWIKPEN) 100 UNIT/ML Inject 46 Units into the skin daily. Increase to MDD 50 units daily as instructed.   Insulin Pen Needle (DROPLET PEN NEEDLES) 32G X 4 MM MISC USE TO INJECT INSULIN DAILY   lisinopril  (ZESTRIL ) 20 MG tablet Take 1 tablet (20 mg total) by mouth daily.   metFORMIN  (GLUCOPHAGE -XR) 500 MG 24 hr tablet Take 2 tablets (1,000 mg total) by mouth 2 (two) times daily.   methylcellulose oral powder Take by mouth daily.   metoprolol  tartrate (LOPRESSOR ) 25 MG tablet TAKE 1 TABLET BY MOUTH 2 TIMES A DAY   Multiple Vitamin (MULTIVITAMIN) tablet Take 1 tablet by mouth daily.   omeprazole  (PRILOSEC) 20 MG capsule TAKE 1 CAPSULE BY MOUTH TWICE DAILY (Patient taking differently: Take 20 mg by mouth daily.)   oxybutynin  (DITROPAN ) 5 MG tablet TAKE 1 TABLET BY MOUTH 2 TIMES A DAY   rosuvastatin  (CRESTOR ) 10 MG tablet Take 1 tablet (10 mg total) by mouth daily.   No facility-administered encounter medications on file as of 01/02/2024.     Lab Results  Component Value Date   WBC 9.1 02/28/2023   HGB 13.4 02/28/2023   HCT 41.0 02/28/2023   PLT 227.0 02/28/2023   GLUCOSE 189 (H) 10/29/2023   CHOL 120 10/29/2023   TRIG 124.0 10/29/2023   HDL 52.40 10/29/2023   LDLDIRECT 58.0 06/01/2021   LDLCALC 43 10/29/2023   ALT 21 10/29/2023   AST 19 10/29/2023   NA 140  10/29/2023   K 4.0 10/29/2023   CL 102 10/29/2023   CREATININE 1.00 10/29/2023   BUN 30 (H) 10/29/2023   CO2 28 10/29/2023   TSH 0.72 02/28/2023   INR 0.99 11/15/2012   HGBA1C 8.2 (H) 10/29/2023   MICROALBUR <0.7 02/28/2023    MM 3D SCREENING MAMMOGRAM BILATERAL BREAST Result Date: 11/27/2023 CLINICAL DATA:  Screening. EXAM: DIGITAL SCREENING BILATERAL MAMMOGRAM WITH TOMOSYNTHESIS AND CAD TECHNIQUE: Bilateral screening digital craniocaudal and mediolateral oblique mammograms were obtained. Bilateral screening digital breast tomosynthesis was performed. The images were evaluated with computer-aided detection. COMPARISON:  Previous exam(s). ACR Breast Density Category b: There are scattered areas of fibroglandular density. FINDINGS: There are no findings suspicious for malignancy. IMPRESSION: No mammographic evidence of malignancy. A result letter of this screening mammogram will be mailed directly to the patient. RECOMMENDATION: Screening mammogram in one year. (Code:SM-B-01Y) BI-RADS CATEGORY  1: Negative. Electronically Signed   By: Amanda Jungling M.D.   On: 11/27/2023 09:13       Assessment & Plan:  Right knee pain, unspecified chronicity Assessment & Plan: Persistent right knee pain as outlined. Refer to ortho for further evaluation and treatment.   Orders: -     Ambulatory referral to Orthopedic Surgery  Anemia, unspecified type Assessment & Plan: Follow cbc.    Atrial fibrillation, unspecified type [facility]) Assessment & Plan: Continues on metoprolol  and aspirin. Stable.    Type 2 diabetes mellitus with hyperglycemia, without long-term current use of insulin (HCC) Assessment & Plan: Continues on jardiance  and metformin . Sugars as outlined. Improving. Continue low carb diet and exercise. Follow sugars. Schedule for met b and A1c.    Gastroesophageal reflux disease, unspecified whether esophagitis present Assessment & Plan: Continue prilosec.     Hypercholesteremia Assessment & Plan: Continue Crestor .  Low-cholesterol diet and exercise.  Follow lipid panel liver function test. No changes.  Lab Results  Component Value Date   CHOL 120 10/29/2023   HDL 52.40 10/29/2023   LDLCALC 43 10/29/2023   LDLDIRECT 58.0 06/01/2021   TRIG 124.0 10/29/2023   CHOLHDL 2 10/29/2023      Primary hypertension Assessment & Plan: Continue lisinopril , HCTZ and metoprolol .  Blood pressure as outlined.  Continue current medication regimen.  No changes today. Follow pressures.    Lymphedema Assessment & Plan: Needs to use lymphedema pump.    Obstructive sleep apnea Assessment & Plan: CPAP.       Dellar Fenton, MD

## 2024-01-05 ENCOUNTER — Encounter: Payer: Self-pay | Admitting: Internal Medicine

## 2024-01-05 NOTE — Assessment & Plan Note (Signed)
 Continue prilosec

## 2024-01-05 NOTE — Assessment & Plan Note (Signed)
 Continue Crestor .  Low-cholesterol diet and exercise.  Follow lipid panel liver function test. No changes.  Lab Results  Component Value Date   CHOL 120 10/29/2023   HDL 52.40 10/29/2023   LDLCALC 43 10/29/2023   LDLDIRECT 58.0 06/01/2021   TRIG 124.0 10/29/2023   CHOLHDL 2 10/29/2023

## 2024-01-05 NOTE — Assessment & Plan Note (Signed)
 CPAP.

## 2024-01-05 NOTE — Assessment & Plan Note (Signed)
 Needs to use lymphedema pump.

## 2024-01-05 NOTE — Assessment & Plan Note (Signed)
Persistent right knee pain as outlined.  Refer to ortho for further evaluation and treatment.

## 2024-01-05 NOTE — Assessment & Plan Note (Signed)
 Follow cbc.

## 2024-01-05 NOTE — Assessment & Plan Note (Signed)
 Continues on jardiance  and metformin . Sugars as outlined. Improving. Continue low carb diet and exercise. Follow sugars. Schedule for met b and A1c.

## 2024-01-05 NOTE — Assessment & Plan Note (Signed)
 Continues on metoprolol  and aspirin. Stable.

## 2024-01-05 NOTE — Assessment & Plan Note (Signed)
 Continue lisinopril, HCTZ and metoprolol.  Blood pressure as outlined.  Continue current medication regimen.  No changes today.  Follow pressures.

## 2024-01-14 ENCOUNTER — Encounter: Payer: Self-pay | Admitting: Internal Medicine

## 2024-01-30 ENCOUNTER — Other Ambulatory Visit: Payer: Self-pay | Admitting: Internal Medicine

## 2024-02-03 NOTE — Progress Notes (Signed)
 02/04/2024 Name: Christine Robertson MRN: [id number] DOB: [birth year]  Chief Complaint  Patient presents with   Diabetes   Christine Robertson is a 68 y.o. year old female who presented for a follow up telephone visit.   They were referred to the pharmacist by their PCP for assistance in managing diabetes.   Pertinent PMH: HTN, AF, OSA, DM2, GERD, Hx UTI, HLD  Subjective:  Care Team: Primary Care Provider: Dellar Fenton, MD  Medication Access/Adherence Patient reports affordability concerns with their medications: Yes  Daughter purchases Jardiance  25 mg tablet "over seas" when she was [place] (travels regularly). Previously given BI cares application though husband planned to start new job so it was not completed. At this time, husband has officially retired. Income via household of 2 consists of social security retirement only.   Diabetes: Since last visit, patient reports doing well overall. Confirms receipt of Novo PAP medication, Tresiba  and pen needles. Has not provided income for [facility], nor has she received application. Confirms she still has a good supply of Jardiance  25 mg at home.   Current medications:  Tresiba  46 units daily (takes in the morning) Metformin  XR 1000 mg twice daily Jardiance  25 mg daily  Medications tried in past: GLP1: Stomach upset on higher doses of Ozempic  - Noted by GI that she could re-try incretin therapy. Previously over income limit for GLP1 MAP.  Patient denies hypoglycemic s/sx. No readings <70 mg/dL. Lowest 90 mg/dL Patient denies hyperglycemic symptoms.  Diet:  Feels very well informed on her dietary choices based on her ongoing use of Libre device. She is a lot more confident with her choices regarding how her sugars will respond to certain foods. Feels in control of her diabetes management.   Breakfast: Eggs, oatmeal or 1 slice whole wheat toast Lunch:  Sandwich Dinner: Fast food ~3 times week. Meat (hamburger, chick), vegetable (green  beans, frozen mix), sometimes potatoes Beverages: Diet coke and water  Current physical activity:  Walks around house with walker, as the cement flooring hurts her knees. Otherwise, ambulates without assistance device.   Objective: Lab Results  Component Value Date   HGBA1C 8.2 (H) 10/29/2023   Lab Results  Component Value Date   CREATININE 1.00 10/29/2023   BUN 30 (H) 10/29/2023   NA 140 10/29/2023   K 4.0 10/29/2023   CL 102 10/29/2023   CO2 28 10/29/2023   Lab Results  Component Value Date   CHOL 120 10/29/2023   HDL 52.40 10/29/2023   LDLCALC 43 10/29/2023   LDLDIRECT 58.0 06/01/2021   TRIG 124.0 10/29/2023   CHOLHDL 2 10/29/2023   Medications Reviewed Today   Medications were not reviewed in this encounter    Assessment/Plan:   Diabetes: Uncontrolled per A1c 8.2% (10/29/23), worsened from previous 7.2%. However, since last PCP visit, glycemic control has improved on CGM report with GMI 7.0, TIR 76%, High 22%, Very High 2%.   Continue current medication without changes Novo Nordisk enrollment approved, has received shipment.  BI Cares application re-printed for Jardiance . Patient will sign in clinic this week.   Future Consideration: GLP1-RA: Previously did not qualify for Ozempic  MAP due to exceeding income requirement. Tolerated Ozempic  previously, some stomach upset on higher doses - Noted by GI that she could re-try incretin therapy.  Consider Ozempic  if needed since approved for Novo Nordisk.   Follow Up Plan:  PCP visit as scheduled 1 month  PharmD follow up as needed. Will plan for 4-6 weeks after PCP  visit depending on outcome   Future Appointments  Date Time Provider Department Center  02/26/2024 10:30 AM [facility]  03/03/2024  2:00 PM Dellar Fenton, MD [facility]  04/07/2024  1:30 PM [facility] PHARMACIST [facility]  11/25/2024  3:00 PM [facility] ANNUAL WELLNESS VISIT [facility]   Daron Ellen, PharmD Clinical  Pharmacist [facility] [phone]

## 2024-02-04 ENCOUNTER — Other Ambulatory Visit (INDEPENDENT_AMBULATORY_CARE_PROVIDER_SITE_OTHER): Admitting: Pharmacist

## 2024-02-04 ENCOUNTER — Encounter: Payer: Self-pay | Admitting: Pharmacist

## 2024-02-04 DIAGNOSIS — E1165 Type 2 diabetes mellitus with hyperglycemia: Secondary | ICD-10-CM

## 2024-02-04 NOTE — Progress Notes (Signed)
 Patient Assistance Program (PAP) Application   Manufacturer: [employer] (BI Cares)    (New enrollment) Medication(s): Jardiance  25 mg tablet  Application filled out and placed in front office for patient signature in folder "W" for [employer].   Next Steps: Patient plans to sign this week at front office and will drop off income documents as well.  Once signed by patient, please place all pages in PCP folder for PCP review and signature. Then CMA to fax to [facility].    Forwarded to [facility] CPhT Patient Advocate Team for future correspondences/re-enrollment.  Note routed to PCP Clinic Pool to ensure PCP signature is obtained and application is faxed.

## 2024-02-04 NOTE — Patient Instructions (Signed)
 [name],   It was a pleasure to speak with you today! As we discussed:?   Continue all medications as you have been taking them.   I have re-printed your application for free Jardiance  and it has been placed in the front office for your signature. You may sign when you stop by clinic to drop off income documents for you and your husband.   Below are the two programs that your are enrolled for/applying for:   [employer] Patient Assistance Foundation [place]) Medication: Tresiba , NovoFine Pen Needles  Phone: [phone] M-F 8am-8pm ET   [employer] Patient Assistance Program ([employer]) Medication: Jardiance  tablet Phone: [phone]       For any questions after your application has been submitted,  Please contact the program directly by phone. To Check your application status For Questions regarding your medication shipment or delivery details To see when your next medication refill will be shipped out To request medication refills   Please reach out prior to your next scheduled appointment should you have any questions or concerns.  You may respond directly to this message, or leave me a voicemail at [phone] and I will get back to you shortly.   Thank you!   Future Appointments  Date Time Provider Department Center  02/26/2024 10:30 AM [facility]  03/03/2024  2:00 PM Dellar Fenton, MD [facility]  04/07/2024  1:30 PM [facility] PHARMACIST [facility]  11/25/2024  3:00 PM [facility] ANNUAL WELLNESS VISIT [facility]    Daron Ellen, PharmD Clinical Pharmacist [facility] [phone]

## 2024-02-05 NOTE — Progress Notes (Signed)
 Noted

## 2024-02-25 ENCOUNTER — Telehealth: Payer: Self-pay | Admitting: Internal Medicine

## 2024-02-25 ENCOUNTER — Other Ambulatory Visit: Payer: Self-pay

## 2024-02-25 DIAGNOSIS — E1165 Type 2 diabetes mellitus with hyperglycemia: Secondary | ICD-10-CM

## 2024-02-25 DIAGNOSIS — D649 Anemia, unspecified: Secondary | ICD-10-CM

## 2024-02-25 DIAGNOSIS — E7800 Pure hypercholesterolemia, unspecified: Secondary | ICD-10-CM

## 2024-02-25 NOTE — Telephone Encounter (Signed)
 Labs ordered.

## 2024-02-25 NOTE — Telephone Encounter (Signed)
 Patient need lab orders.

## 2024-02-26 ENCOUNTER — Other Ambulatory Visit (INDEPENDENT_AMBULATORY_CARE_PROVIDER_SITE_OTHER)

## 2024-02-26 DIAGNOSIS — D649 Anemia, unspecified: Secondary | ICD-10-CM | POA: Diagnosis not present

## 2024-02-26 DIAGNOSIS — E7800 Pure hypercholesterolemia, unspecified: Secondary | ICD-10-CM

## 2024-02-26 DIAGNOSIS — E1165 Type 2 diabetes mellitus with hyperglycemia: Secondary | ICD-10-CM | POA: Diagnosis not present

## 2024-02-26 LAB — HEPATIC FUNCTION PANEL
ALT: 18 U/L (ref 0–35)
AST: 14 U/L (ref 0–37)
Albumin: 4 g/dL (ref 3.5–5.2)
Alkaline Phosphatase: 52 U/L (ref 39–117)
Bilirubin, Direct: 0.1 mg/dL (ref 0.0–0.3)
Total Bilirubin: 0.5 mg/dL (ref 0.2–1.2)
Total Protein: 6.7 g/dL (ref 6.0–8.3)

## 2024-02-26 LAB — CBC WITH DIFFERENTIAL/PLATELET
Basophils Absolute: 0 10*3/uL (ref 0.0–0.1)
Basophils Relative: 0.4 % (ref 0.0–3.0)
Eosinophils Absolute: 0.1 10*3/uL (ref 0.0–0.7)
Eosinophils Relative: 1.7 % (ref 0.0–5.0)
HCT: 38.9 % (ref 36.0–46.0)
Hemoglobin: 12.8 g/dL (ref 12.0–15.0)
Lymphocytes Relative: 17.9 % (ref 12.0–46.0)
Lymphs Abs: 1.4 10*3/uL (ref 0.7–4.0)
MCHC: 32.8 g/dL (ref 30.0–36.0)
MCV: 89.5 fl (ref 78.0–100.0)
Monocytes Absolute: 0.6 10*3/uL (ref 0.1–1.0)
Monocytes Relative: 7 % (ref 3.0–12.0)
Neutro Abs: 5.8 10*3/uL (ref 1.4–7.7)
Neutrophils Relative %: 73 % (ref 43.0–77.0)
Platelets: 198 10*3/uL (ref 150.0–400.0)
RBC: 4.35 Mil/uL (ref 3.87–5.11)
RDW: 14 % (ref 11.5–15.5)
WBC: 8 10*3/uL (ref 4.0–10.5)

## 2024-02-26 LAB — LIPID PANEL
Cholesterol: 122 mg/dL (ref 0–200)
HDL: 57.2 mg/dL (ref >39.00)
LDL Cholesterol: 49 mg/dL (ref 0–99)
NonHDL: 64.72
Total CHOL/HDL Ratio: 2
Triglycerides: 81 mg/dL (ref 0.0–149.0)
VLDL: 16.2 mg/dL (ref 0.0–40.0)

## 2024-02-26 LAB — BASIC METABOLIC PANEL WITH GFR
BUN: 30 mg/dL — ABNORMAL HIGH (ref 6–23)
CO2: 29 meq/L (ref 19–32)
Calcium: 9.5 mg/dL (ref 8.4–10.5)
Chloride: 107 meq/L (ref 96–112)
Creatinine, Ser: 0.97 mg/dL (ref 0.40–1.20)
GFR: 59.46 mL/min — ABNORMAL LOW (ref >60.00)
Glucose, Bld: 109 mg/dL — ABNORMAL HIGH (ref 70–99)
Potassium: 4.2 meq/L (ref 3.5–5.1)
Sodium: 144 meq/L (ref 135–145)

## 2024-02-26 LAB — TSH: TSH: 0.66 u[IU]/mL (ref 0.35–5.50)

## 2024-02-26 LAB — HEMOGLOBIN A1C: Hgb A1c MFr Bld: 7.2 % — ABNORMAL HIGH (ref 4.6–6.5)

## 2024-02-27 ENCOUNTER — Ambulatory Visit: Payer: Self-pay | Admitting: Internal Medicine

## 2024-03-03 ENCOUNTER — Encounter: Payer: Self-pay | Admitting: Internal Medicine

## 2024-03-03 ENCOUNTER — Ambulatory Visit (INDEPENDENT_AMBULATORY_CARE_PROVIDER_SITE_OTHER): Admitting: Internal Medicine

## 2024-03-03 VITALS — BP 130/70 | HR 70 | Temp 98.1°F | Ht 63.0 in | Wt 298.6 lb

## 2024-03-03 DIAGNOSIS — G4733 Obstructive sleep apnea (adult) (pediatric): Secondary | ICD-10-CM

## 2024-03-03 DIAGNOSIS — E1165 Type 2 diabetes mellitus with hyperglycemia: Secondary | ICD-10-CM | POA: Diagnosis not present

## 2024-03-03 DIAGNOSIS — R0602 Shortness of breath: Secondary | ICD-10-CM

## 2024-03-03 DIAGNOSIS — K219 Gastro-esophageal reflux disease without esophagitis: Secondary | ICD-10-CM

## 2024-03-03 DIAGNOSIS — I4891 Unspecified atrial fibrillation: Secondary | ICD-10-CM | POA: Diagnosis not present

## 2024-03-03 DIAGNOSIS — Z7984 Long term (current) use of oral hypoglycemic drugs: Secondary | ICD-10-CM

## 2024-03-03 DIAGNOSIS — I10 Essential (primary) hypertension: Secondary | ICD-10-CM

## 2024-03-03 DIAGNOSIS — I890 Lymphedema, not elsewhere classified: Secondary | ICD-10-CM

## 2024-03-03 DIAGNOSIS — E7800 Pure hypercholesterolemia, unspecified: Secondary | ICD-10-CM

## 2024-03-03 NOTE — Progress Notes (Signed)
 Subjective:    Patient ID: Christine Robertson, female    DOB: 1955-10-19, 68 y.o.   MRN: [id number]  Patient here for  Chief Complaint  Patient presents with   Medical Management of Chronic Issues    Pt saw cardiologist she is in AFIB    HPI Here for a scheduled follow up - follow up regarding diabetes, paroxysmal afib - maintained on metoprolol  and flecainide, hypercholesterolemia and OSA. Last visit, right knee pain. Referred to ortho. 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 S/p cortisone injection. Consider gel injection. Continues on jaridance and metformin . Had f/u with cardiology 02/05/24 - recommended restarting flecainide and continue metoprolol . Not walking as much. Has noticed recently - increased sob. Has noticed intermittent tightness in her neck. Blood pressures 130-70s. No abdominal pain or bowel change reported. Increased weight gain.    Past Medical History:  Diagnosis Date   Abnormal results of thyroid  function studies    Allergy    hay fever   Angina pectoris, unspecified (HCC)    Arthritis    Atrial fibrillation, unspecified    Chicken pox    Cold hands    Congenital anomaly of adrenal gland    Diabetes mellitus    type 2, uncomplicated   Essential hypertension, benign    Family history of adverse reaction to anesthesia    Mother - PONV   GERD (gastroesophageal reflux disease)    IBS (irritable bowel syndrome)    Malaise    Morbid obesity (HCC)    Murmur    Pain in soft tissues of limb    Proteinuria    Pure hypercholesterolemia    Rash and other nonspecific skin eruption    Sleep apnea    Obstructive; Uses C-Pap machine   Urinary incontinence    Vertigo    1 episode, approx 2013   Wears contact lenses    left eye   Past Surgical History:  Procedure Laterality Date   ABDOMINAL HYSTERECTOMY  2010   BLADDER SURGERY  1967   stretching of bladder   BREAST BIOPSY Left 03/28/2015   done at Dr. Fredirick office. fibrocystic  changes   CATARACT EXTRACTION W/PHACO Right 04/10/2022   Procedure: CATARACT EXTRACTION PHACO AND INTRAOCULAR LENS PLACEMENT (IOC) RIGHT DIABETIC VIVITY LENS;  Surgeon: Jaye Fallow, MD;  Location: [facility];  Service: Ophthalmology;  Laterality: Right;  6.89 0:40.8   CATARACT EXTRACTION W/PHACO Left 04/24/2022   Procedure: CATARACT EXTRACTION PHACO AND INTRAOCULAR LENS PLACEMENT (IOC) LEFT DIABETIC VIVITY  toric LENS 5.29 00:41.8;  Surgeon: Jaye Fallow, MD;  Location: [facility];  Service: Ophthalmology;  Laterality: Left;  Diabetic   COLONOSCOPY     COLONOSCOPY WITH PROPOFOL  N/A 09/09/2015   Procedure: COLONOSCOPY WITH PROPOFOL ;  Surgeon: Lamar ONEIDA Holmes, MD;  Location: [facility] END[facility];  Service: Endoscopy;  Laterality: N/A;   COLONOSCOPY WITH PROPOFOL  N/A 09/02/2023   Procedure: COLONOSCOPY WITH PROPOFOL ;  Surgeon: Maryruth Ole ONEIDA, MD;  Location: [facility] ENDOSCOPY;  Service: Endoscopy;  Laterality: N/A;   ESOPHAGOGASTRODUODENOSCOPY     EYE SURGERY     TONSILLECTOMY AND ADENOIDECTOMY  1959   URETHRAL DILATION     Family History  Problem Relation Age of Onset   Diabetes Mother    Cancer Father        lung   Cerebral aneurysm Brother 67       s/p rupture   Breast cancer Maternal Grandmother 60   Breast cancer Maternal Aunt  53   Social History   Socioeconomic History   Marital status: Married    Spouse name: Not on file   Number of children: Not on file   Years of education: Not on file   Highest education level: 12th grade  Occupational History   Not on file  Tobacco Use   Smoking status: Never   Smokeless tobacco: Never  Vaping Use   Vaping status: Never Used  Substance and Sexual Activity   Alcohol use: No    Alcohol/week: 0.0 standard drinks of alcohol   Drug use: No   Sexual activity: Not on file  Other Topics Concern   Not on file  Social History Narrative   Not on file   Social Drivers of Health   Financial Resource Strain:  Medium Risk (02/28/2024)   Overall Financial Resource Strain (CARDIA)    Difficulty of Paying Living Expenses: Somewhat hard  Food Insecurity: No Food Insecurity (02/28/2024)   Hunger Vital Sign    Worried About Running Out of Food in the Last Year: Never true    Ran Out of Food in the Last Year: Never true  Transportation Needs: No Transportation Needs (02/28/2024)   PRAPARE - Administrator, Civil Service (Medical): No    Lack of Transportation (Non-Medical): No  Physical Activity: Inactive (02/28/2024)   Exercise Vital Sign    Days of Exercise per Week: 0 days    Minutes of Exercise per Session: Not on file  Stress: No Stress Concern Present (02/28/2024)   [employer] of Occupational Health - Occupational Stress Questionnaire    Feeling of Stress: Not at all  Social Connections: Socially Integrated (02/28/2024)   Social Connection and Isolation Panel    Frequency of Communication with Friends and Family: More than three times a week    Frequency of Social Gatherings with Friends and Family: Three times a week    Attends Religious Services: More than 4 times per year    Active Member of Clubs or Organizations: Yes    Attends Banker Meetings: Not on file    Marital Status: Married     Review of Systems  Constitutional:  Negative for appetite change.       Weight gain as outlined.   HENT:  Negative for congestion and sinus pressure.   Respiratory:  Positive for shortness of breath. Negative for cough and chest tightness.   Cardiovascular:  Positive for leg swelling. Negative for chest pain.  Gastrointestinal:  Negative for abdominal pain, diarrhea, nausea and vomiting.  Genitourinary:  Negative for difficulty urinating and dysuria.  Musculoskeletal:  Negative for myalgias.       Right knee pain as outlined.   Skin:  Negative for color change and rash.  Neurological:  Negative for dizziness and headaches.  Psychiatric/Behavioral:  Negative for  agitation and dysphoric mood.        Objective:     BP 130/70   Pulse 70   Temp 98.1 F (36.7 C) (Oral)   Ht 5' 3 (1.6 m)   Wt 298 lb 9.6 oz (135.4 kg)   SpO2 94%   BMI 52.89 kg/m  Wt Readings from Last 3 Encounters:  03/03/24 298 lb 9.6 oz (135.4 kg)  01/02/24 277 lb (125.6 kg)  11/26/23 279 lb (126.6 kg)    Physical Exam Vitals reviewed.  Constitutional:      General: She is not in acute distress.    Appearance: Normal appearance.  HENT:  Head: Normocephalic and atraumatic.     Right Ear: External ear normal.     Left Ear: External ear normal.     Mouth/Throat:     Pharynx: No oropharyngeal exudate.  Eyes:     General: No scleral icterus.       Right eye: No discharge.        Left eye: No discharge.     Conjunctiva/sclera: Conjunctivae normal.  Neck:     Thyroid : No thyromegaly.  Cardiovascular:     Rate and Rhythm: Normal rate.     Comments: Irregularly irregular Pulmonary:     Effort: No respiratory distress.     Breath sounds: Normal breath sounds. No wheezing.  Abdominal:     General: Bowel sounds are normal.     Palpations: Abdomen is soft.     Tenderness: There is no abdominal tenderness.  Musculoskeletal:        General: No tenderness.     Cervical back: Neck supple. No tenderness.     Comments: Chronic swelling.   Lymphadenopathy:     Cervical: No cervical adenopathy.  Skin:    Findings: No erythema or rash.  Neurological:     Mental Status: She is alert.  Psychiatric:        Mood and Affect: Mood normal.        Behavior: Behavior normal.         Outpatient Encounter Medications as of 03/03/2024  Medication Sig   aspirin 81 MG tablet Take 81 mg by mouth daily.   cetirizine (ZYRTEC) 10 MG tablet Take 10 mg by mouth daily.   empagliflozin  (JARDIANCE ) 25 MG TABS tablet Take 1 tablet (25 mg total) by mouth daily.   hydrochlorothiazide  (HYDRODIURIL ) 25 MG tablet Take 1 tablet (25 mg total) by mouth daily.   Insulin Glargine  (BASAGLAR   KWIKPEN) 100 UNIT/ML Inject 46 Units into the skin daily. Increase to MDD 50 units daily as instructed.   Insulin Pen Needle (DROPLET PEN NEEDLES) 32G X 4 MM MISC USE TO INJECT INSULIN DAILY   lisinopril  (ZESTRIL ) 20 MG tablet TAKE 1 TABLET BY MOUTH DAILY   metFORMIN  (GLUCOPHAGE -XR) 500 MG 24 hr tablet Take 2 tablets (1,000 mg total) by mouth 2 (two) times daily.   methylcellulose oral powder Take by mouth daily.   metoprolol  tartrate (LOPRESSOR ) 25 MG tablet TAKE 1 TABLET BY MOUTH 2 TIMES A DAY   Multiple Vitamin (MULTIVITAMIN) tablet Take 1 tablet by mouth daily.   omeprazole  (PRILOSEC) 20 MG capsule TAKE 1 CAPSULE BY MOUTH TWICE DAILY (Patient taking differently: Take 20 mg by mouth daily.)   oxybutynin  (DITROPAN ) 5 MG tablet TAKE 1 TABLET BY MOUTH 2 TIMES A DAY   rosuvastatin  (CRESTOR ) 10 MG tablet Take 1 tablet (10 mg total) by mouth daily.   No facility-administered encounter medications on file as of 03/03/2024.     Lab Results  Component Value Date   WBC 8.0 02/26/2024   HGB 12.8 02/26/2024   HCT 38.9 02/26/2024   PLT 198.0 02/26/2024   GLUCOSE 109 (H) 02/26/2024   CHOL 122 02/26/2024   TRIG 81.0 02/26/2024   HDL 57.20 02/26/2024   LDLDIRECT 58.0 06/01/2021   LDLCALC 49 02/26/2024   ALT 18 02/26/2024   AST 14 02/26/2024   NA 144 02/26/2024   K 4.2 02/26/2024   CL 107 02/26/2024   CREATININE 0.97 02/26/2024   BUN 30 (H) 02/26/2024   CO2 29 02/26/2024   TSH 0.66 02/26/2024   INR 0.99  11/15/2012   HGBA1C 7.2 (H) 02/26/2024    MM 3D SCREENING MAMMOGRAM BILATERAL BREAST Result Date: 11/27/2023 CLINICAL DATA:  Screening. EXAM: DIGITAL SCREENING BILATERAL MAMMOGRAM WITH TOMOSYNTHESIS AND CAD TECHNIQUE: Bilateral screening digital craniocaudal and mediolateral oblique mammograms were obtained. Bilateral screening digital breast tomosynthesis was performed. The images were evaluated with computer-aided detection. COMPARISON:  Previous exam(s). ACR Breast Density Category b: There  are scattered areas of fibroglandular density. FINDINGS: There are no findings suspicious for malignancy. IMPRESSION: No mammographic evidence of malignancy. A result letter of this screening mammogram will be mailed directly to the patient. RECOMMENDATION: Screening mammogram in one year. (Code:SM-B-01Y) BI-RADS CATEGORY  1: Negative. Electronically Signed   By: Alm Parkins M.D.   On: 11/27/2023 09:13       Assessment & Plan:  SOB (shortness of breath) on exertion Assessment & Plan: EKG as outlined - afib. SOB and weight gain as outlined. Concern regarding possible CHF. Discussed lasix. Given appt with cardiology tomorrow, will hold on changing medication today. Follow.  Continue jardiance .   Orders: -     EKG 12-Lead  Atrial fibrillation, unspecified type [facility]) Assessment & Plan: Continues on metoprolol  and flecainide. EKG - afib, ventricular rate 60s. Increased weight gain. Increased sob. Discussed further w/up and treatment. Discussed f/u with cardiology - f/u echo. Contacted cardiology - appt tomorrow for reevaluation and further w/up.    Type 2 diabetes mellitus with hyperglycemia, without long-term current use of insulin (HCC) Assessment & Plan: Continues on jardiance  and metformin . Continue low carb diet and exercise. Follow sugars. Follow metabolic panel and A1c.    Gastroesophageal reflux disease, unspecified whether esophagitis present Assessment & Plan: Continue prilosec.    Obstructive sleep apnea Assessment & Plan: CPAP.    Lymphedema Assessment & Plan: Has been evaluated previously. Lymphedema - needs to use.    Primary hypertension Assessment & Plan: Continue lisinopril , HCTZ and metoprolol .  Blood pressure as outlined.  Continue current medication regimen.  No change today. Follow metabolic panel.    Hypercholesteremia Assessment & Plan: Continue Crestor .  Low-cholesterol diet and exercise.  Follow lipid panel liver function test. No changes today.  Lab  Results  Component Value Date   CHOL 122 02/26/2024   HDL 57.20 02/26/2024   LDLCALC 49 02/26/2024   LDLDIRECT 58.0 06/01/2021   TRIG 81.0 02/26/2024   CHOLHDL 2 02/26/2024         Allena Hamilton, MD

## 2024-03-03 NOTE — Patient Instructions (Signed)
 Dr Wilburn - 3:15 [facility]

## 2024-03-04 ENCOUNTER — Ambulatory Visit: Payer: Self-pay | Admitting: Internal Medicine

## 2024-03-04 ENCOUNTER — Other Ambulatory Visit: Payer: Self-pay

## 2024-03-04 DIAGNOSIS — E119 Type 2 diabetes mellitus without complications: Secondary | ICD-10-CM

## 2024-03-08 ENCOUNTER — Encounter: Payer: Self-pay | Admitting: Internal Medicine

## 2024-03-08 NOTE — Assessment & Plan Note (Signed)
 Continues on jardiance  and metformin . Continue low carb diet and exercise. Follow sugars. Follow metabolic panel and A1c.

## 2024-03-08 NOTE — Assessment & Plan Note (Signed)
 Continues on metoprolol  and flecainide. EKG - afib, ventricular rate 60s. Increased weight gain. Increased sob. Discussed further w/up and treatment. Discussed f/u with cardiology - f/u echo. Contacted cardiology - appt tomorrow for reevaluation and further w/up.

## 2024-03-08 NOTE — Assessment & Plan Note (Signed)
 Continue Crestor .  Low-cholesterol diet and exercise.  Follow lipid panel liver function test. No changes today.  Lab Results  Component Value Date   CHOL 122 02/26/2024   HDL 57.20 02/26/2024   LDLCALC 49 02/26/2024   LDLDIRECT 58.0 06/01/2021   TRIG 81.0 02/26/2024   CHOLHDL 2 02/26/2024

## 2024-03-08 NOTE — Assessment & Plan Note (Signed)
 Has been evaluated previously. Lymphedema - needs to use.

## 2024-03-08 NOTE — Assessment & Plan Note (Signed)
 Continue lisinopril , HCTZ and metoprolol .  Blood pressure as outlined.  Continue current medication regimen.  No change today. Follow metabolic panel.

## 2024-03-08 NOTE — Assessment & Plan Note (Signed)
 Continue prilosec

## 2024-03-08 NOTE — Assessment & Plan Note (Signed)
 EKG as outlined - afib. SOB and weight gain as outlined. Concern regarding possible CHF. Discussed lasix. Given appt with cardiology tomorrow, will hold on changing medication today. Follow.  Continue jardiance .

## 2024-03-08 NOTE — Assessment & Plan Note (Signed)
 CPAP.

## 2024-03-19 DIAGNOSIS — I4819 Other persistent atrial fibrillation: Secondary | ICD-10-CM

## 2024-03-20 ENCOUNTER — Other Ambulatory Visit: Payer: Self-pay | Admitting: Internal Medicine

## 2024-03-20 DIAGNOSIS — E1165 Type 2 diabetes mellitus with hyperglycemia: Secondary | ICD-10-CM

## 2024-03-24 ENCOUNTER — Other Ambulatory Visit: Payer: Self-pay

## 2024-03-24 ENCOUNTER — Ambulatory Visit
Admission: RE | Admit: 2024-03-24 | Discharge: 2024-03-24 | Disposition: A | Discharge status: Home / Self Care | Source: Ambulatory Visit

## 2024-03-24 ENCOUNTER — Ambulatory Visit: Admitting: Anesthesiology

## 2024-03-24 ENCOUNTER — Encounter: Payer: Self-pay | Admitting: Cardiology

## 2024-03-24 ENCOUNTER — Encounter
Admission: RE | Disposition: A | Discharge status: Home / Self Care | Payer: Self-pay | Source: Home / Self Care | Attending: Cardiology

## 2024-03-24 ENCOUNTER — Ambulatory Visit
Admission: RE | Admit: 2024-03-24 | Discharge: 2024-03-24 | Disposition: A | Discharge status: Home / Self Care | Attending: Cardiology | Admitting: Cardiology

## 2024-03-24 DIAGNOSIS — E785 Hyperlipidemia, unspecified: Secondary | ICD-10-CM | POA: Diagnosis not present

## 2024-03-24 DIAGNOSIS — E119 Type 2 diabetes mellitus without complications: Secondary | ICD-10-CM | POA: Insufficient documentation

## 2024-03-24 DIAGNOSIS — Z794 Long term (current) use of insulin: Secondary | ICD-10-CM | POA: Insufficient documentation

## 2024-03-24 DIAGNOSIS — G4733 Obstructive sleep apnea (adult) (pediatric): Secondary | ICD-10-CM | POA: Diagnosis not present

## 2024-03-24 DIAGNOSIS — I890 Lymphedema, not elsewhere classified: Secondary | ICD-10-CM | POA: Diagnosis not present

## 2024-03-24 DIAGNOSIS — K219 Gastro-esophageal reflux disease without esophagitis: Secondary | ICD-10-CM | POA: Insufficient documentation

## 2024-03-24 DIAGNOSIS — Z6841 Body Mass Index (BMI) 40.0 and over, adult: Secondary | ICD-10-CM | POA: Diagnosis not present

## 2024-03-24 DIAGNOSIS — I340 Nonrheumatic mitral (valve) insufficiency: Secondary | ICD-10-CM | POA: Insufficient documentation

## 2024-03-24 DIAGNOSIS — I4891 Unspecified atrial fibrillation: Secondary | ICD-10-CM | POA: Diagnosis present

## 2024-03-24 DIAGNOSIS — Z7984 Long term (current) use of oral hypoglycemic drugs: Secondary | ICD-10-CM | POA: Insufficient documentation

## 2024-03-24 DIAGNOSIS — Z79899 Other long term (current) drug therapy: Secondary | ICD-10-CM | POA: Insufficient documentation

## 2024-03-24 DIAGNOSIS — I10 Essential (primary) hypertension: Secondary | ICD-10-CM | POA: Diagnosis not present

## 2024-03-24 DIAGNOSIS — I440 Atrioventricular block, first degree: Secondary | ICD-10-CM | POA: Diagnosis not present

## 2024-03-24 DIAGNOSIS — Z7901 Long term (current) use of anticoagulants: Secondary | ICD-10-CM | POA: Insufficient documentation

## 2024-03-24 DIAGNOSIS — I4819 Other persistent atrial fibrillation: Secondary | ICD-10-CM | POA: Diagnosis not present

## 2024-03-24 LAB — ECHO TEE

## 2024-03-24 LAB — GLUCOSE, CAPILLARY: Glucose-Capillary: 133 mg/dL — ABNORMAL HIGH (ref 70–99)

## 2024-03-24 SURGERY — ECHOCARDIOGRAM, TRANSESOPHAGEAL
Anesthesia: General

## 2024-03-24 MED ORDER — PHENYLEPHRINE 80 MCG/ML (10ML) SYRINGE FOR IV PUSH (FOR BLOOD PRESSURE SUPPORT)
PREFILLED_SYRINGE | INTRAVENOUS | Status: DC | PRN
  Administered 2024-03-24: 80 ug via INTRAVENOUS

## 2024-03-24 MED ORDER — PROPOFOL 10 MG/ML IV BOLUS
INTRAVENOUS | Status: DC | PRN
  Administered 2024-03-24: 120 mg via INTRAVENOUS
  Administered 2024-03-24 (×2): 50 mg via INTRAVENOUS
  Administered 2024-03-24: 30 mg via INTRAVENOUS

## 2024-03-24 MED ORDER — BUTAMBEN-TETRACAINE-BENZOCAINE 2-2-14 % EX AERO
INHALATION_SPRAY | CUTANEOUS | Status: AC
  Filled 2024-03-24: qty 5

## 2024-03-24 MED ORDER — LIDOCAINE HCL (PF) 2 % IJ SOLN
INTRAMUSCULAR | Status: DC | PRN
  Administered 2024-03-24: 100 mg via INTRADERMAL

## 2024-03-24 MED ORDER — LIDOCAINE VISCOUS HCL 2 % MT SOLN
OROMUCOSAL | Status: AC
  Filled 2024-03-24: qty 15

## 2024-03-24 MED ORDER — SODIUM CHLORIDE 0.9 % IV SOLN: INTRAVENOUS | Status: DC

## 2024-03-24 NOTE — Anesthesia Postprocedure Evaluation (Signed)
 Anesthesia Post Note  Patient: Christine Robertson  Procedure(s) Performed: ECHOCARDIOGRAM, TRANSESOPHAGEAL CARDIOVERSION  Patient location during evaluation: PACU Anesthesia Type: General Level of consciousness: awake Pain management: satisfactory to patient Vital Signs Assessment: post-procedure vital signs reviewed and stable Respiratory status: spontaneous breathing Cardiovascular status: stable Anesthetic complications: no   No notable events documented.   Last Vitals:  Vitals:   03/24/24 1315 03/24/24 1330  BP: 115/66 129/73  Pulse: 72 67  Resp: (!) 26 20  Temp:  (!) 36.2 C  SpO2: 100% 99%    Last Pain:  Vitals:   03/24/24 1330  TempSrc: Temporal  PainSc: 0-No pain                 VAN STAVEREN,Yassir Enis

## 2024-03-24 NOTE — Transfer of Care (Signed)
 Immediate Anesthesia Transfer of Care Note  Patient: Christine Robertson  Procedure(s) Performed: ECHOCARDIOGRAM, TRANSESOPHAGEAL CARDIOVERSION  Patient Location: Short Stay  Anesthesia Type:General  Level of Consciousness: drowsy and patient cooperative  Airway & Oxygen Therapy: Patient Spontanous Breathing and Patient connected to nasal cannula oxygen  Post-op Assessment: Report given to RN and Post -op Vital signs reviewed and stable  Post vital signs: Reviewed and stable  Last Vitals:  Vitals Value Taken Time  BP 114/53 03/24/24 12:46  Temp    Pulse 65 03/24/24 12:47  Resp 21 03/24/24 12:47  SpO2 96 % 03/24/24 12:47  Vitals shown include unfiled device data.  Last Pain:  Vitals:   03/24/24 1246  TempSrc:   PainSc: 0-No pain         Complications: No notable events documented.

## 2024-03-24 NOTE — H&P (Signed)
 H&P:  History of Present Illness: Christine Robertson is a 68 y.o.female patient who presented for management of atrial fibrillation  Past medical history significant for remote history of A-fib requiring cardioversion. Again noted to be back in atrial fibrillation in first week of 02/2024. Other history include insulin-dependent diabetes type 2, hypertension, hyperlipidemia, obesity. She was on flecainide which was discontinued in the past as A-fib was well-controlled and then restarted recently when she went back into A-fib. Echo 01/2023 with normal biventricular systolic function, no significant valvular abnormality, mild left atrial enlargement  Patient details that she has symptoms of exertional dyspnea. No orthopnea. Has chronic lymphedema, unsure if this is worse than before. She has about 20 pound weight gain in the last month. No chest pain/pressure. Has intermittent palpitation. EKG today showed dual fibrillation with heart rate about 60 bpm.  Past Medical and Surgical History  Past Medical History Past Medical History:  Diagnosis Date  Atrial fibrillation (CMS/HHS-HCC)  Diabetes mellitus type 2, uncomplicated (CMS/HHS-HCC)  GERD (gastroesophageal reflux disease)  Hypertension   Past Surgical History She has a past surgical history that includes Tonsillectomy; Laparoscopic Hysterectomy Total; Urethral dilation; egd (03/17/2012); Colonoscopy (02/20/2010); Colonoscopy (09/09/2015); and Colon @ [facility] (09/02/2023).   Medications and Allergies  Current Medications  Current Outpatient Medications  Medication Sig Dispense Refill  cetirizine (ZYRTEC) 10 mg capsule Take 10 mg by mouth once daily.  empagliflozin  (JARDIANCE ) 10 mg tablet Take 10 mg by mouth once daily Takes 25mg   flecainide (TAMBOCOR) 100 MG tablet Take 1 tablet (100 mg total) by mouth 2 (two) times daily 180 tablet 3  hydrochlorothiazide  (HYDRODIURIL ) 25 MG tablet Take 25 mg by mouth once daily.  insulin DEGLUDEC  (TRESIBA   FLEXTOUCH) pen injector (concentration 100 units/mL) Inject 38 Units subcutaneously once daily Takes 46 units  metFORMIN  (GLUCOPHAGE -XR) 500 MG XR tablet Take 1,000 mg by mouth 2 (two) times daily  metoprolol  tartrate (LOPRESSOR ) 25 MG tablet Take 1 tablet (25 mg total) by mouth 2 (two) times daily 60 tablet 11  multivitamin with iron (CENTRUM ADULT COMPLETE) tablet Take 1 tablet by mouth once daily.  omeprazole  (PRILOSEC) 20 MG DR capsule Take 20 mg by mouth once daily  oxybutynin  (DITROPAN -XL) 5 MG XL tablet Take 5 mg by mouth 2 (two) times daily  rosuvastatin  (CRESTOR ) 10 MG tablet Take 10 mg by mouth once daily  apixaban (ELIQUIS) 5 mg tablet Take 1 tablet (5 mg total) by mouth every 12 (twelve) hours 60 tablet 5  FUROsemide (LASIX) 20 MG tablet Take 1 tablet (20 mg total) by mouth once daily 30 tablet 11  gabapentin (NEURONTIN) 300 MG capsule 1 po qHS x 4 days, then bid (Patient not taking: Reported on 03/04/2024) 60 capsule 5   No current facility-administered medications for this visit.   Allergies: Erythropoietin analogues, Augmentin [amoxicillin-pot clavulanate], Demerol [meperidine], and Erythromycin  Social and Family History  Social History reports that she has never smoked. She has never used smokeless tobacco. She reports that she does not drink alcohol and does not use drugs.  Family History Family History  Problem Relation Name Age of Onset  Alzheimer's disease Mother  Diabetes Mother  Lung cancer Father  Colon polyps Father   Review of Systems   Review of Systems:  Exertional dyspnea, weight gain  Physical Examination    Vitals:   03/24/24 1138  Pulse: 68  Resp: (!) 24  Temp: 99.4 F (37.4 C)  SpO2: 95%     HEENT: Pupils equally reactive to light and  accomodation  Neck: Supple, no significant JVD Lungs: clear to auscultation bilaterally; no wheezes, rales, rhonchi Heart: Normal rate, irregular rhythm. No murmur Extremities: +1 edema around ankle. She  has chronic lymphedema in upper part of calf which is chronic  Assessment and Plan   69 y.o. female with  Persistent symptomatic atrial fibrillation Diabetes type 2 Hypertension Morbid obesity with BMI of 53 OSA on CPAP  Will proceed with TEE/cardioversion.  Continue current medications including anticoagulation.  Keller Paterson, MD [facility]- [facility]

## 2024-03-24 NOTE — Anesthesia Procedure Notes (Signed)
 Procedure Name: MAC Date/Time: 03/24/2024 12:11 PM  Performed by: Lorrene Camelia LABOR, CRNAPre-anesthesia Checklist: Patient identified, Emergency Drugs available, Suction available and Patient being monitored Patient Re-evaluated:Patient Re-evaluated prior to induction Preoxygenation: Pre-oxygenation with 100% oxygen Induction Type: IV induction Comments: POM

## 2024-03-24 NOTE — Anesthesia Preprocedure Evaluation (Signed)
 Anesthesia Evaluation  Patient identified by MRN, date of birth, ID band Patient awake    Reviewed: Allergy & Precautions, NPO status , Patient's Chart, lab work & pertinent test results  Airway Mallampati: II  TM Distance: >3 FB Neck ROM: full    Dental  (+) Teeth Intact   Pulmonary neg pulmonary ROS, sleep apnea    Pulmonary exam normal  + decreased breath sounds      Cardiovascular Exercise Tolerance: Good hypertension, Pt. on medications + angina  negative cardio ROS Normal cardiovascular exam+ dysrhythmias Atrial Fibrillation  Rhythm:Irregular     Neuro/Psych negative neurological ROS  negative psych ROS   GI/Hepatic negative GI ROS, Neg liver ROS,GERD  Medicated,,  Endo/Other  negative endocrine ROSdiabetes, Well Controlled, Insulin Dependent  Class 4 obesity  Renal/GU negative Renal ROS  negative genitourinary   Musculoskeletal  (+) Arthritis ,    Abdominal  (+) + obese  Peds negative pediatric ROS (+)  Hematology negative hematology ROS (+) Blood dyscrasia, anemia   Anesthesia Other Findings Past Medical History: No date: Abnormal results of thyroid  function studies No date: Allergy     Comment:  hay fever No date: Angina pectoris, unspecified (HCC) No date: Arthritis No date: Atrial fibrillation, unspecified No date: Chicken pox No date: Cold hands No date: Congenital anomaly of adrenal gland No date: Diabetes mellitus     Comment:  type 2, uncomplicated No date: Essential hypertension, benign No date: Family history of adverse reaction to anesthesia     Comment:  Mother - PONV No date: GERD (gastroesophageal reflux disease) No date: IBS (irritable bowel syndrome) No date: Malaise No date: Morbid obesity (HCC) No date: Murmur No date: Pain in soft tissues of limb No date: Proteinuria No date: Pure hypercholesterolemia No date: Rash and other nonspecific skin eruption No date: Sleep apnea      Comment:  Obstructive; Uses C-Pap machine No date: Urinary incontinence No date: Vertigo     Comment:  1 episode, approx 2013 No date: Wears contact lenses     Comment:  left eye  Past Surgical History: 2010: ABDOMINAL HYSTERECTOMY 1967: BLADDER SURGERY     Comment:  stretching of bladder 03/28/2015: BREAST BIOPSY; Left     Comment:  done at Dr. Fredirick office. fibrocystic changes 04/10/2022: CATARACT EXTRACTION W/PHACO; Right     Comment:  Procedure: CATARACT EXTRACTION PHACO AND INTRAOCULAR               LENS PLACEMENT (IOC) RIGHT DIABETIC VIVITY LENS;                Surgeon: Jaye Fallow, MD;  Location: [facility]              [facility];  Service: Ophthalmology;  Laterality: Right;                6.89 0:40.8 04/24/2022: CATARACT EXTRACTION W/PHACO; Left     Comment:  Procedure: CATARACT EXTRACTION PHACO AND INTRAOCULAR               LENS PLACEMENT (IOC) LEFT DIABETIC VIVITY  toric LENS               5.29 00:41.8;  Surgeon: Jaye Fallow, MD;  Location:              [facility];  Service: Ophthalmology;                Laterality: Left;  Diabetic No date: COLONOSCOPY 09/09/2015: COLONOSCOPY WITH PROPOFOL ; N/A  Comment:  Procedure: COLONOSCOPY WITH PROPOFOL ;  Surgeon: Lamar ONEIDA Holmes, MD;  Location: [facility] ENDOSCOPY;  Service:               Endoscopy;  Laterality: N/A; 09/02/2023: COLONOSCOPY WITH PROPOFOL ; N/A     Comment:  Procedure: COLONOSCOPY WITH PROPOFOL ;  Surgeon:               Maryruth Ole ONEIDA, MD;  Location: [facility] ENDOSCOPY;                Service: Endoscopy;  Laterality: N/A; No date: ESOPHAGOGASTRODUODENOSCOPY No date: EYE SURGERY 1959: TONSILLECTOMY AND ADENOIDECTOMY No date: URETHRAL DILATION  BMI    Body Mass Index: 49.95 kg/m      Reproductive/Obstetrics negative OB ROS                              Anesthesia Physical Anesthesia Plan  ASA: 3  Anesthesia Plan: General   Post-op Pain  Management:    Induction: Intravenous  PONV Risk Score and Plan: Propofol  infusion and TIVA  Airway Management Planned: Natural Airway and Nasal Cannula  Additional Equipment:   Intra-op Plan:   Post-operative Plan:   Informed Consent: I have reviewed the patients History and Physical, chart, labs and discussed the procedure including the risks, benefits and alternatives for the proposed anesthesia with the patient or authorized representative who has indicated his/her understanding and acceptance.     Dental Advisory Given  Plan Discussed with: CRNA  Anesthesia Plan Comments:          Anesthesia Quick Evaluation

## 2024-03-24 NOTE — Procedures (Signed)
 Electrical Cardioversion Procedure Note  Indication: Atrial Fibrillation  Procedure Details: Consent: Indication, Risk/benefits of procedure as well as the alternatives explained to patient and informed consent obtained. Time out performed. Verified patient identification, verified procedure, verified correct patient position, special equipment/implants available, medications/allergies/relevent history reviewed, required imaging and test results reviewed.  Deep sedation was provided by anesthesia with propofol. Patient was delivered with 200 Joules of electricity X 1 with success to Sinus rhythm. Patient tolerated the procedure well. No immediate complication noted.   Successful cardioversion  Christine Norfolk, MD [facility]- [facility]

## 2024-03-25 ENCOUNTER — Encounter: Payer: Self-pay | Admitting: Cardiology

## 2024-03-30 ENCOUNTER — Other Ambulatory Visit: Payer: Self-pay | Admitting: Pharmacist

## 2024-03-30 NOTE — Progress Notes (Signed)
 Brief Telephone Documentation Reason for Call: Patient assistance application - still pending patient signature   Summary of Call: Called patient 7/28. She confirms she had forgotten to come in to sign her J[employer]iance  application.  Life has been busier lately with family matters/travel.   Patient plans to sign application in clinic. Reviewed required documents including copy of SSI retirement letters for herself and her husband.   PAP application re-uploaded to front-office eFax folder for patient signature   Follow Up: Patient given direct line for further questions/concerns.  Manuelita FABIENE Kobs, PharmD Clinical Pharmacist [facility] [phone]

## 2024-04-06 ENCOUNTER — Telehealth: Payer: Self-pay

## 2024-04-06 NOTE — Telephone Encounter (Signed)
 Copied from CRM #[id number]. Topic: Appointments - Appointment Cancel/Reschedule >> Apr 06, 2024  8:18 AM [place] E wrote: Patient/patient representative is calling to cancel or reschedule an appointment. Refer to attachments for appointment information. Patient was calling in regarding her pharmacist appt tomorrow, patient stated she just talked with a pharmacist last week and would like this appointment cancelled, if that is okay. Callback number [phone].

## 2024-04-07 ENCOUNTER — Other Ambulatory Visit

## 2024-04-09 ENCOUNTER — Telehealth: Payer: Self-pay

## 2024-04-09 NOTE — Telephone Encounter (Signed)
 Pt notified Pt assistance meds ready for pick up.  Tresiba  u100/ml lot: [id number] exp: 01/31/26 4 boxes

## 2024-04-09 NOTE — Telephone Encounter (Signed)
 2 boxes of pen needles NOVOFINE 32G lot: [id number] exp: 08/02/28

## 2024-04-21 ENCOUNTER — Ambulatory Visit: Admitting: Internal Medicine

## 2024-04-23 ENCOUNTER — Other Ambulatory Visit: Payer: Self-pay | Admitting: Cardiology

## 2024-04-23 DIAGNOSIS — I4819 Other persistent atrial fibrillation: Secondary | ICD-10-CM

## 2024-05-18 ENCOUNTER — Ambulatory Visit
Admission: RE | Admit: 2024-05-18 | Discharge: 2024-05-18 | Disposition: A | Discharge status: Home / Self Care | Source: Ambulatory Visit | Attending: Cardiology | Admitting: Cardiology

## 2024-05-18 DIAGNOSIS — I4819 Other persistent atrial fibrillation: Secondary | ICD-10-CM | POA: Insufficient documentation

## 2024-05-18 LAB — POCT I-STAT CREATININE: Creatinine, Ser: 1.1 mg/dL — ABNORMAL HIGH (ref 0.44–1.00)

## 2024-05-18 MED ORDER — METOPROLOL TARTRATE 5 MG/5ML IV SOLN
INTRAVENOUS | Status: AC
Start: 2024-05-18 — End: 2024-05-18
  Filled 2024-05-18: qty 10

## 2024-05-18 MED ORDER — DILTIAZEM HCL 25 MG/5ML IV SOLN: 10.0000 mg | INTRAVENOUS | Status: DC | PRN

## 2024-05-18 MED ORDER — IOHEXOL 350 MG/ML SOLN
100.0000 mL | Freq: Once | INTRAVENOUS | Status: AC | PRN
  Administered 2024-05-18: 120 mL via INTRAVENOUS

## 2024-05-18 MED ORDER — NITROGLYCERIN 0.4 MG SL SUBL
0.8000 mg | SUBLINGUAL_TABLET | Freq: Once | SUBLINGUAL | Status: AC
  Administered 2024-05-18: 0.8 mg via SUBLINGUAL
  Filled 2024-05-18: qty 25

## 2024-05-18 MED ORDER — METOPROLOL TARTRATE 5 MG/5ML IV SOLN
10.0000 mg | Freq: Once | INTRAVENOUS | Status: AC | PRN
  Administered 2024-05-18: 10 mg via INTRAVENOUS

## 2024-05-18 NOTE — Progress Notes (Signed)
 Patient tolerated CT well. Drank water after. Vital signs stable encourage to drink water throughout day.Reasons explained and verbalized understanding. Ambulated steady gait.

## 2024-06-22 ENCOUNTER — Other Ambulatory Visit: Payer: Self-pay | Admitting: Internal Medicine

## 2024-06-23 ENCOUNTER — Ambulatory Visit (INDEPENDENT_AMBULATORY_CARE_PROVIDER_SITE_OTHER): Admitting: Internal Medicine

## 2024-06-23 ENCOUNTER — Encounter: Payer: Self-pay | Admitting: Internal Medicine

## 2024-06-23 ENCOUNTER — Other Ambulatory Visit

## 2024-06-23 VITALS — BP 110/68 | HR 62 | Temp 98.4°F | Ht 63.0 in | Wt 279.8 lb

## 2024-06-23 DIAGNOSIS — G4733 Obstructive sleep apnea (adult) (pediatric): Secondary | ICD-10-CM

## 2024-06-23 DIAGNOSIS — K219 Gastro-esophageal reflux disease without esophagitis: Secondary | ICD-10-CM

## 2024-06-23 DIAGNOSIS — R918 Other nonspecific abnormal finding of lung field: Secondary | ICD-10-CM

## 2024-06-23 DIAGNOSIS — I4891 Unspecified atrial fibrillation: Secondary | ICD-10-CM

## 2024-06-23 DIAGNOSIS — E1165 Type 2 diabetes mellitus with hyperglycemia: Secondary | ICD-10-CM

## 2024-06-23 DIAGNOSIS — D649 Anemia, unspecified: Secondary | ICD-10-CM

## 2024-06-23 DIAGNOSIS — Z23 Encounter for immunization: Secondary | ICD-10-CM | POA: Diagnosis not present

## 2024-06-23 DIAGNOSIS — M25561 Pain in right knee: Secondary | ICD-10-CM

## 2024-06-23 DIAGNOSIS — I10 Essential (primary) hypertension: Secondary | ICD-10-CM

## 2024-06-23 DIAGNOSIS — E7800 Pure hypercholesterolemia, unspecified: Secondary | ICD-10-CM

## 2024-06-23 MED ORDER — METOPROLOL TARTRATE 25 MG PO TABS: 25.0000 mg | ORAL_TABLET | Freq: Two times a day (BID) | ORAL | 1 refills | Status: AC

## 2024-06-23 MED ORDER — HYDROCHLOROTHIAZIDE 25 MG PO TABS: 25.0000 mg | ORAL_TABLET | Freq: Every day | ORAL | 1 refills | Status: AC

## 2024-06-23 MED ORDER — ROSUVASTATIN CALCIUM 10 MG PO TABS: 10.0000 mg | ORAL_TABLET | Freq: Every day | ORAL | 3 refills | Status: AC

## 2024-06-23 MED ORDER — DROPLET PEN NEEDLES 32G X 4 MM MISC
3 refills | Status: AC
Start: 2024-06-23 — End: ?

## 2024-06-23 MED ORDER — OXYBUTYNIN CHLORIDE 5 MG PO TABS: 5.0000 mg | ORAL_TABLET | Freq: Two times a day (BID) | ORAL | 1 refills | Status: AC

## 2024-06-23 MED ORDER — OMEPRAZOLE 20 MG PO CPDR: 20.0000 mg | DELAYED_RELEASE_CAPSULE | Freq: Every day | ORAL | 1 refills | Status: AC

## 2024-06-23 MED ORDER — METFORMIN HCL ER 500 MG PO TB24: 1000.0000 mg | ORAL_TABLET | Freq: Two times a day (BID) | ORAL | 1 refills | Status: AC

## 2024-06-23 MED ORDER — LISINOPRIL 20 MG PO TABS: 20.0000 mg | ORAL_TABLET | Freq: Every day | ORAL | 1 refills | Status: AC

## 2024-06-23 NOTE — Progress Notes (Signed)
 Subjective:    Patient ID: Christine Robertson, female    DOB: [birth year], 68 y.o.   MRN: [id number]  Patient here for  Chief Complaint  Patient presents with   Anemia   Diabetes   Knee Pain    HPI Here for a scheduled follow up - follow up regarding diabetes, paroxysmal afib - maintained on metoprolol  and flecainide, hypercholesterolemia and OSA. Saw cardiology 03/04/24 - persistent symptomatic afib. Started on eliquis. Started on lasix. Recommended TEE/cardioversion. S/p cardioversion 03/24/24. TEE 03/2024 - normal biventricular systolic function, LVEF 50-55% with no significant valvular abnormality. Had f/u with cardiology 04/21/24 - reported exertional dyspnea, intermittent neck/jaw tightness. Recommended CTA coronaries. Continue jardiance , lasix and eliquis.  Several mm sized lung nodules. There is a right lower lobe 7 mm nodule as well. Non-contrast chest CT at 6-12 months is recommended. If the nodule is stable at time of repeat CT, then future CT at 18-24 months (from today's scan) is considered optional for low-risk patients, but is recommended for high-risk patients. No chest pain reported today. Breathing stable. Has been having issues with her right knee. S/p injection. Has soon ortho. Plans to call for f/u. Reviewed outside sugar readings - 06/10/24 - 06/23/24 - 23% active. Target range - 40% with high 52% and very high 8%.      Past Medical History:  Diagnosis Date   Abnormal results of thyroid  function studies    Allergy    hay fever   Angina pectoris, unspecified    Arthritis    Atrial fibrillation, unspecified    Chicken pox    Cold hands    Congenital anomaly of adrenal gland    Diabetes mellitus    type 2, uncomplicated   Essential hypertension, benign    Family history of adverse reaction to anesthesia    Mother - PONV   GERD (gastroesophageal reflux disease)    IBS (irritable bowel syndrome)    Malaise    Morbid obesity (HCC)    Murmur    Pain in soft tissues  of limb    Proteinuria    Pure hypercholesterolemia    Rash and other nonspecific skin eruption    Sleep apnea    Obstructive; Uses C-Pap machine   Urinary incontinence    Vertigo    1 episode, approx 2013   Wears contact lenses    left eye   Past Surgical History:  Procedure Laterality Date   ABDOMINAL HYSTERECTOMY  2010   BLADDER SURGERY  1967   stretching of bladder   BREAST BIOPSY Left 03/28/2015   done at Dr. Fredirick office. fibrocystic changes   CARDIOVERSION N/A 03/24/2024   Procedure: CARDIOVERSION;  Surgeon: Wilburn Keller JAYSON, MD;  Location: [facility];  Service: Cardiovascular;  Laterality: N/A;   CATARACT EXTRACTION W/PHACO Right 04/10/2022   Procedure: CATARACT EXTRACTION PHACO AND INTRAOCULAR LENS PLACEMENT (IOC) RIGHT DIABETIC VIVITY LENS;  Surgeon: Jaye Fallow, MD;  Location: [facility];  Service: Ophthalmology;  Laterality: Right;  6.89 0:40.8   CATARACT EXTRACTION W/PHACO Left 04/24/2022   Procedure: CATARACT EXTRACTION PHACO AND INTRAOCULAR LENS PLACEMENT (IOC) LEFT DIABETIC VIVITY  toric LENS 5.29 00:41.8;  Surgeon: Jaye Fallow, MD;  Location: [facility];  Service: Ophthalmology;  Laterality: Left;  Diabetic   COLONOSCOPY     COLONOSCOPY WITH PROPOFOL  N/A 09/09/2015   Procedure: COLONOSCOPY WITH PROPOFOL ;  Surgeon: Lamar ONEIDA Holmes, MD;  Location[facility];  Service: Endoscopy;  Laterality: N/A;   COLONOSCOPY WITH PROPOFOL  N/A  09/02/2023   Procedure: COLONOSCOPY WITH PROPOFOL ;  Surgeon: Maryruth Ole DASEN, MD;  Location: [facility] ENDOSCOPY;  Service: Endoscopy;  Laterality: N/A;   ESOPHAGOGASTRODUODENOSCOPY     EYE SURGERY     TEE WITHOUT CARDIOVERSION N/A 03/24/2024   Procedure: ECHOCARDIOGRAM, TRANSESOPHAGEAL;  Surgeon: Alluri, Keller BROCKS, MD;  Location: [facility];  Service: Cardiovascular;  Laterality: N/A;   TONSILLECTOMY AND ADENOIDECTOMY  1959   URETHRAL DILATION     Family History  Problem Relation Age of Onset   Diabetes  Mother    Cancer Father        lung   Cerebral aneurysm Brother 35       s/p rupture   Breast cancer Maternal Grandmother 60   Breast cancer Maternal Aunt 16   Social History   Socioeconomic History   Marital status: Married    Spouse name: Not on file   Number of children: Not on file   Years of education: Not on file   Highest education level: 12th grade  Occupational History   Not on file  Tobacco Use   Smoking status: Never   Smokeless tobacco: Never  Vaping Use   Vaping status: Never Used  Substance and Sexual Activity   Alcohol use: No    Alcohol/week: 0.0 standard drinks of alcohol   Drug use: No   Sexual activity: Not on file  Other Topics Concern   Not on file  Social History Narrative   Not on file   Social Drivers of Health   Financial Resource Strain: Low Risk  (06/22/2024)   Overall Financial Resource Strain (CARDIA)    Difficulty of Paying Living Expenses: Not very hard  Food Insecurity: No Food Insecurity (06/22/2024)   Hunger Vital Sign    Worried About Running Out of Food in the Last Year: Never true    Ran Out of Food in the Last Year: Never true  Transportation Needs: No Transportation Needs (06/22/2024)   PRAPARE - Administrator, Civil Service (Medical): No    Lack of Transportation (Non-Medical): No  Physical Activity: Inactive (06/22/2024)   Exercise Vital Sign    Days of Exercise per Week: 0 days    Minutes of Exercise per Session: Not on file  Stress: No Stress Concern Present (06/22/2024)   [employer] of Occupational Health - Occupational Stress Questionnaire    Feeling of Stress: Not at all  Social Connections: Socially Integrated (06/22/2024)   Social Connection and Isolation Panel    Frequency of Communication with Friends and Family: More than three times a week    Frequency of Social Gatherings with Friends and Family: Three times a week    Attends Religious Services: More than 4 times per year    Active Member  of Clubs or Organizations: Yes    Attends Banker Meetings: More than 4 times per year    Marital Status: Married     Review of Systems  Constitutional:  Negative for appetite change, fever and unexpected weight change.  HENT:  Negative for congestion and sinus pressure.   Respiratory:  Negative for cough and chest tightness.        Breathing stable.   Cardiovascular:  Negative for chest pain and palpitations.       Chronic leg swelling. Stable.   Gastrointestinal:  Negative for abdominal pain, diarrhea, nausea and vomiting.  Genitourinary:  Negative for difficulty urinating and dysuria.  Musculoskeletal:  Negative for myalgias.  Knee pain as outlined.   Skin:  Negative for color change and rash.  Neurological:  Negative for dizziness and headaches.  Psychiatric/Behavioral:  Negative for agitation and dysphoric mood.        Objective:     BP 110/68 (BP Location: Left Arm, Patient Position: Sitting, Cuff Size: Normal)   Pulse 62   Temp 98.4 F (36.9 C) (Oral)   Ht 5' 3 (1.6 m)   Wt 279 lb 12.8 oz (126.9 kg)   SpO2 98%   BMI 49.56 kg/m  Wt Readings from Last 3 Encounters:  06/23/24 279 lb 12.8 oz (126.9 kg)  03/24/24 282 lb (127.9 kg)  03/03/24 298 lb 9.6 oz (135.4 kg)    Physical Exam Vitals reviewed.  Constitutional:      General: She is not in acute distress.    Appearance: Normal appearance.  HENT:     Head: Normocephalic and atraumatic.     Right Ear: External ear normal.     Left Ear: External ear normal.     Mouth/Throat:     Pharynx: No oropharyngeal exudate or posterior oropharyngeal erythema.  Eyes:     General: No scleral icterus.       Right eye: No discharge.        Left eye: No discharge.     Conjunctiva/sclera: Conjunctivae normal.  Neck:     Thyroid : No thyromegaly.  Cardiovascular:     Rate and Rhythm: Normal rate and regular rhythm.  Pulmonary:     Effort: No respiratory distress.     Breath sounds: Normal breath  sounds. No wheezing.  Abdominal:     General: Bowel sounds are normal.     Palpations: Abdomen is soft.     Tenderness: There is no abdominal tenderness.  Musculoskeletal:        General: No tenderness.     Cervical back: Neck supple. No tenderness.     Comments: Chronic lower extremity swelling.   Lymphadenopathy:     Cervical: No cervical adenopathy.  Skin:    Findings: No erythema or rash.  Neurological:     Mental Status: She is alert.  Psychiatric:        Mood and Affect: Mood normal.        Behavior: Behavior normal.         Outpatient Encounter Medications as of 06/23/2024  Medication Sig   apixaban (ELIQUIS) 5 MG TABS tablet Take 5 mg by mouth 2 (two) times daily.   cetirizine (ZYRTEC) 10 MG tablet Take 10 mg by mouth daily.   empagliflozin  (JARDIANCE ) 25 MG TABS tablet Take 1 tablet (25 mg total) by mouth daily.   flecainide (TAMBOCOR) 100 MG tablet Take 100 mg by mouth 2 (two) times daily.   furosemide (LASIX) 20 MG tablet Take 20 mg by mouth daily.   Insulin Glargine  (BASAGLAR  KWIKPEN) 100 UNIT/ML Inject 46 Units into the skin daily. Increase to MDD 50 units daily as instructed.   metoprolol  tartrate (LOPRESSOR ) 25 MG tablet TAKE 1 TABLET BY MOUTH 2 TIMES A DAY   Multiple Vitamin (MULTIVITAMIN) tablet Take 1 tablet by mouth daily.   hydrochlorothiazide  (HYDRODIURIL ) 25 MG tablet Take 1 tablet (25 mg total) by mouth daily.   Insulin Pen Needle (DROPLET PEN NEEDLES) 32G X 4 MM MISC USE TO INJECT INSULIN DAILY   lisinopril  (ZESTRIL ) 20 MG tablet Take 1 tablet (20 mg total) by mouth daily.   metFORMIN  (GLUCOPHAGE -XR) 500 MG 24 hr tablet Take 2 tablets (1,000  mg total) by mouth 2 (two) times daily.   metoprolol  tartrate (LOPRESSOR ) 25 MG tablet Take 1 tablet (25 mg total) by mouth 2 (two) times daily.   omeprazole  (PRILOSEC) 20 MG capsule Take 1 capsule (20 mg total) by mouth daily.   oxybutynin  (DITROPAN ) 5 MG tablet Take 1 tablet (5 mg total) by mouth 2 (two) times  daily.   rosuvastatin  (CRESTOR ) 10 MG tablet Take 1 tablet (10 mg total) by mouth daily.   [DISCONTINUED] hydrochlorothiazide  (HYDRODIURIL ) 25 MG tablet TAKE 1 TABLET BY MOUTH DAILY   [DISCONTINUED] Insulin Pen Needle (DROPLET PEN NEEDLES) 32G X 4 MM MISC USE TO INJECT INSULIN DAILY   [DISCONTINUED] lisinopril  (ZESTRIL ) 20 MG tablet TAKE 1 TABLET BY MOUTH DAILY   [DISCONTINUED] metFORMIN  (GLUCOPHAGE -XR) 500 MG 24 hr tablet TAKE 2 TABLETS BY MOUTH TWICE A DAY   [DISCONTINUED] metoprolol  tartrate (LOPRESSOR ) 25 MG tablet TAKE 1 TABLET BY MOUTH 2 TIMES A DAY   [DISCONTINUED] omeprazole  (PRILOSEC) 20 MG capsule TAKE 1 CAPSULE BY MOUTH TWICE DAILY (Patient taking differently: Take 20 mg by mouth daily.)   [DISCONTINUED] oxybutynin  (DITROPAN ) 5 MG tablet TAKE 1 TABLET BY MOUTH 2 TIMES A DAY   [DISCONTINUED] rosuvastatin  (CRESTOR ) 10 MG tablet Take 1 tablet (10 mg total) by mouth daily.   No facility-administered encounter medications on file as of 06/23/2024.     Lab Results  Component Value Date   WBC 10.7 (H) 06/23/2024   HGB 14.0 06/23/2024   HCT 42.7 06/23/2024   PLT 220.0 06/23/2024   GLUCOSE 111 (H) 06/23/2024   CHOL 149 06/23/2024   TRIG 167.0 (H) 06/23/2024   HDL 55.20 06/23/2024   LDLDIRECT 58.0 06/01/2021   LDLCALC 60 06/23/2024   ALT 18 06/23/2024   AST 15 06/23/2024   NA 141 06/23/2024   K 4.5 06/23/2024   CL 101 06/23/2024   CREATININE 1.10 06/23/2024   BUN 43 (H) 06/23/2024   CO2 25 06/23/2024   TSH 0.66 02/26/2024   INR 0.99 11/15/2012   HGBA1C 7.7 (H) 06/23/2024   MICROALBUR <0.7 06/23/2024    CT CORONARY MORPH W/CTA COR W/SCORE W/CA W/CM &/OR WO/CM Addendum Date: 05/22/2024 ADDENDUM REPORT: 05/22/2024 10:29 EXAM: OVER-READ INTERPRETATION  CT CHEST The following report is an over-read performed by radiologist Dr. Fonda Mom [facility] on 05/22/2024. This over-read does not include interpretation of cardiac or coronary anatomy or pathology. The  coronary CTA interpretation by the cardiologist is attached. COMPARISON:  None. FINDINGS: Cardiovascular:  See findings discussed in the body of the report. Mediastinum/Nodes: No suspicious adenopathy identified. Imaged mediastinal structures are unremarkable. Lungs/Pleura: Several bibasilar tiny mm sized lung nodules. In the right lower lobe there is also a nodule measuring 7 mm in the medial base. See image 30. No pleural effusion or pneumothorax. Upper Abdomen: No acute abnormality. Musculoskeletal: No chest wall abnormality. No acute osseous findings. IMPRESSION: Several mm sized lung nodules. There is a right lower lobe 7 mm nodule as well. Non-contrast chest CT at 6-12 months is recommended. If the nodule is stable at time of repeat CT, then future CT at 18-24 months (from today's scan) is considered optional for low-risk patients, but is recommended for high-risk patients. This recommendation follows the consensus statement: Guidelines for Management of Incidental Pulmonary Nodules Detected on CT Images: From the Fleischner Society 2017; Radiology 2017; 284:228-243. Electronically Signed   By: Fonda Field M.D.   On: 05/22/2024 10:29   Result Date: 05/22/2024 : CLINICAL DATA:  Chest  pain EXAM: Cardiac/Coronary  CTA TECHNIQUE: The patient was scanned on a Siemens Somatom scanner. A prospective scan was triggered in the ascending thoracic aorta. Axial non-contrast 3 mm slices were carried out through the heart. The data set was analyzed on a dedicated work station and scored using the Agatston method. Gantry rotation speed was 66 msecs and collimation was.6 mm. 0.8 Mg of sl NTG and 10 mg IV metoprolol  was given. The 3D data set was reconstructed in 5% intervals of the 60-95 % of the R-R cycle. Diastolic phases were analyzed on a dedicated work station using MPR, MIP and VRT modes. The patient received 100 cc of contrast. FINDINGS: Aorta: Normal size.  No calcifications.  No dissection. Aortic Valve:  Trileaflet.  No calcifications. Coronary Arteries: Normal coronary origin.  Right dominance. RCA is a dominant artery. There is mixed plaque in proximal RCA causing minimal (<25%) stenosis. Left main gives rise to LAD and LCX arteries. LM has no disease. LAD has no plaque. LCX is a non-dominant artery.  There is no plaque. Other findings: Pericardium: Normal. Normal pulmonary vein drainage into the left atrium. Normal left atrial appendage without a thrombus. Normal size of the pulmonary artery. IMPRESSION: 1. Coronary calcium  score of 13. This was 49th percentile for age and sex matched control. 2. Normal coronary origin with right dominance. 3. Minimal proximal RCA stenosis (<25%). 4. CAD-RADS 1. Minimal non-obstructive CAD (0-24%). Consider non-atherosclerotic causes of chest pain. Consider preventive therapy and risk factor modification. 5. See separate report from [facility] radiology for non-cardiac findings. Electronically Signed: By: Keller Paterson M.D. On: 05/19/2024 13:12       Assessment & Plan:  Type 2 diabetes mellitus with hyperglycemia, without long-term current use of insulin (HCC) Assessment & Plan: Continues on jardiance  and metformin . Continue low carb diet and exercise. Follow sugars. Follow metabolic panel and A1c. Sugars as outlined. Discussed elevation. Wants to work on diet and exercise. Discussed other treatment options.  Lab Results  Component Value Date   HGBA1C 7.7 (H) 06/23/2024     Orders: -     Droplet Pen Needles; USE TO INJECT INSULIN DAILY  Dispense: 100 each; Refill: 3 -     metFORMIN  HCl ER; Take 2 tablets (1,000 mg total) by mouth 2 (two) times daily.  Dispense: 360 tablet; Refill: 1 -     Microalbumin / creatinine urine ratio -     Hemoglobin A1c  Hypercholesteremia Assessment & Plan: Continue Crestor .  Low-cholesterol diet and exercise.  Follow lipid panel liver function test. No changes today.  Lab Results  Component Value Date   CHOL 149 06/23/2024    HDL 55.20 06/23/2024   LDLCALC 60 06/23/2024   LDLDIRECT 58.0 06/01/2021   TRIG 167.0 (H) 06/23/2024   CHOLHDL 3 06/23/2024     Orders: -     Lipid panel -     Hepatic function panel -     Basic metabolic panel with GFR  Anemia, unspecified type Assessment & Plan: Follow cbc.   Orders: -     CBC with Differential/Platelet  Atrial fibrillation, unspecified type [facility]) Assessment & Plan: Saw cardiology 03/04/24 - persistent symptomatic afib. Started on eliquis. Started on lasix. Recommended TEE/cardioversion. S/p cardioversion 03/24/24. TEE 03/2024 - normal biventricular systolic function, LVEF 50-55% with no significant valvular abnormality. Had f/u with cardiology 04/21/24 - reported exertional dyspnea, intermittent neck/jaw tightness. Recommended CTA coronaries. Continue jardiance , lasix and eliquis. Continue f/u with cardiology.    Severe obesity (BMI >= 40) (  HCC) Assessment & Plan: Have discussed diet and exercise. Discussed today. Follow    Gastroesophageal reflux disease, unspecified whether esophagitis present Assessment & Plan: Upper symptoms appear to be controlled on prilosec.    Primary hypertension Assessment & Plan: Continue lisinopril , HCTZ and metoprolol .  Blood pressure as outlined.  Continue current medication regimen.  No change today. Follow metabolic panel.    Obstructive sleep apnea Assessment & Plan: CPAP.    Right knee pain, unspecified chronicity Assessment & Plan: Planning f/u with ortho.    Lung nodules Assessment & Plan: Recent CTA revealed - Several mm sized lung nodules. There is a right lower lobe 7 mm nodule as well. Discussed with her today. Agreeable for referral to pulmonary for continued f/u and evaluation.   Orders: -     Pulmonary Visit  Other orders -     hydroCHLOROthiazide ; Take 1 tablet (25 mg total) by mouth daily.  Dispense: 90 tablet; Refill: 1 -     Lisinopril ; Take 1 tablet (20 mg total) by mouth daily.  Dispense: 90  tablet; Refill: 1 -     Metoprolol  Tartrate; Take 1 tablet (25 mg total) by mouth 2 (two) times daily.  Dispense: 180 tablet; Refill: 1 -     Omeprazole ; Take 1 capsule (20 mg total) by mouth daily.  Dispense: 90 capsule; Refill: 1 -     oxyBUTYnin  Chloride; Take 1 tablet (5 mg total) by mouth 2 (two) times daily.  Dispense: 180 tablet; Refill: 1 -     Rosuvastatin  Calcium ; Take 1 tablet (10 mg total) by mouth daily.  Dispense: 90 tablet; Refill: 3 -     Flu vaccine HIGH DOSE PF(Fluzone Trivalent)     Allena Hamilton, MD

## 2024-06-24 LAB — BASIC METABOLIC PANEL WITH GFR
BUN: 43 mg/dL — ABNORMAL HIGH (ref 6–23)
CO2: 25 meq/L (ref 19–32)
Calcium: 9.8 mg/dL (ref 8.4–10.5)
Chloride: 101 meq/L (ref 96–112)
Creatinine, Ser: 1.1 mg/dL (ref 0.40–1.20)
GFR: 51.02 mL/min — ABNORMAL LOW (ref >60.00)
Glucose, Bld: 111 mg/dL — ABNORMAL HIGH (ref 70–99)
Potassium: 4.5 meq/L (ref 3.5–5.1)
Sodium: 141 meq/L (ref 135–145)

## 2024-06-24 LAB — LIPID PANEL
Cholesterol: 149 mg/dL (ref 0–200)
HDL: 55.2 mg/dL (ref >39.00)
LDL Cholesterol: 60 mg/dL (ref 0–99)
NonHDL: 93.54
Total CHOL/HDL Ratio: 3
Triglycerides: 167 mg/dL — ABNORMAL HIGH (ref 0.0–149.0)
VLDL: 33.4 mg/dL (ref 0.0–40.0)

## 2024-06-24 LAB — MICROALBUMIN / CREATININE URINE RATIO
Creatinine,U: 97.5 mg/dL
Microalb Creat Ratio: UNDETERMINED mg/g (ref 0.0–30.0)
Microalb, Ur: <0.7 mg/dL

## 2024-06-24 LAB — CBC WITH DIFFERENTIAL/PLATELET
Basophils Absolute: 0.1 K/uL (ref 0.0–0.1)
Basophils Relative: 0.6 % (ref 0.0–3.0)
Eosinophils Absolute: 0.2 K/uL (ref 0.0–0.7)
Eosinophils Relative: 1.7 % (ref 0.0–5.0)
HCT: 42.7 % (ref 36.0–46.0)
Hemoglobin: 14 g/dL (ref 12.0–15.0)
Lymphocytes Relative: 14.8 % (ref 12.0–46.0)
Lymphs Abs: 1.6 K/uL (ref 0.7–4.0)
MCHC: 32.8 g/dL (ref 30.0–36.0)
MCV: 88.1 fl (ref 78.0–100.0)
Monocytes Absolute: 0.6 K/uL (ref 0.1–1.0)
Monocytes Relative: 5.4 % (ref 3.0–12.0)
Neutro Abs: 8.3 K/uL — ABNORMAL HIGH (ref 1.4–7.7)
Neutrophils Relative %: 77.5 % — ABNORMAL HIGH (ref 43.0–77.0)
Platelets: 220 K/uL (ref 150.0–400.0)
RBC: 4.85 Mil/uL (ref 3.87–5.11)
RDW: 14.4 % (ref 11.5–15.5)
WBC: 10.7 K/uL — ABNORMAL HIGH (ref 4.0–10.5)

## 2024-06-24 LAB — HEPATIC FUNCTION PANEL
ALT: 18 U/L (ref 0–35)
AST: 15 U/L (ref 0–37)
Albumin: 4.4 g/dL (ref 3.5–5.2)
Alkaline Phosphatase: 54 U/L (ref 39–117)
Bilirubin, Direct: 0.1 mg/dL (ref 0.0–0.3)
Total Bilirubin: 0.4 mg/dL (ref 0.2–1.2)
Total Protein: 6.9 g/dL (ref 6.0–8.3)

## 2024-06-24 LAB — HEMOGLOBIN A1C: Hgb A1c MFr Bld: 7.7 % — ABNORMAL HIGH (ref 4.6–6.5)

## 2024-06-25 ENCOUNTER — Ambulatory Visit: Payer: Self-pay | Admitting: Internal Medicine

## 2024-06-25 DIAGNOSIS — E7800 Pure hypercholesterolemia, unspecified: Secondary | ICD-10-CM

## 2024-06-25 DIAGNOSIS — D649 Anemia, unspecified: Secondary | ICD-10-CM

## 2024-06-26 ENCOUNTER — Other Ambulatory Visit

## 2024-06-26 ENCOUNTER — Telehealth: Payer: Self-pay

## 2024-06-26 NOTE — Telephone Encounter (Signed)
 Copied from CRM [id number]. Topic: General - Other >> Jun 26, 2024 11:49 AM Robinson H wrote: Reason for CRM: Records show patient has diabetes but not taking a statin medication. Recommends to take a statin reduce cardiovascular issues and stroke.   [employer] [phone]

## 2024-06-27 NOTE — Telephone Encounter (Signed)
 Per note, is supposed to be on crestor . Please call pt and confirm and then notify Megan

## 2024-06-28 ENCOUNTER — Other Ambulatory Visit: Payer: Self-pay | Admitting: Internal Medicine

## 2024-06-28 ENCOUNTER — Encounter: Payer: Self-pay | Admitting: Internal Medicine

## 2024-06-28 DIAGNOSIS — E1165 Type 2 diabetes mellitus with hyperglycemia: Secondary | ICD-10-CM

## 2024-06-28 DIAGNOSIS — R918 Other nonspecific abnormal finding of lung field: Secondary | ICD-10-CM | POA: Insufficient documentation

## 2024-06-28 NOTE — Assessment & Plan Note (Signed)
 Recent CTA revealed - Several mm sized lung nodules. There is a right lower lobe 7 mm nodule as well. Discussed with her today. Agreeable for referral to pulmonary for continued f/u and evaluation.

## 2024-06-28 NOTE — Assessment & Plan Note (Signed)
 Continue lisinopril , HCTZ and metoprolol .  Blood pressure as outlined.  Continue current medication regimen.  No change today. Follow metabolic panel.

## 2024-06-28 NOTE — Assessment & Plan Note (Signed)
 Saw cardiology 03/04/24 - persistent symptomatic afib. Started on eliquis. Started on lasix. Recommended TEE/cardioversion. S/p cardioversion 03/24/24. TEE 03/2024 - normal biventricular systolic function, LVEF 50-55% with no significant valvular abnormality. Had f/u with cardiology 04/21/24 - reported exertional dyspnea, intermittent neck/jaw tightness. Recommended CTA coronaries. Continue jardiance , lasix and eliquis. Continue f/u with cardiology.

## 2024-06-28 NOTE — Assessment & Plan Note (Signed)
 Continues on jardiance  and metformin . Continue low carb diet and exercise. Follow sugars. Follow metabolic panel and A1c. Sugars as outlined. Discussed elevation. Wants to work on diet and exercise. Discussed other treatment options.  Lab Results  Component Value Date   HGBA1C 7.7 (H) 06/23/2024

## 2024-06-28 NOTE — Assessment & Plan Note (Signed)
 CPAP.

## 2024-06-28 NOTE — Assessment & Plan Note (Signed)
 Have discussed diet and exercise. Discussed today. Follow

## 2024-06-28 NOTE — Assessment & Plan Note (Signed)
 Follow cbc.

## 2024-06-28 NOTE — Assessment & Plan Note (Signed)
 Has had persistent issues with lower extremity swelling. Has seen vascular surgery.  Has lymphedema pump.  Needs to use regularly.  Follow.  Stable.

## 2024-06-28 NOTE — Assessment & Plan Note (Signed)
 Upper symptoms appear to be controlled on prilosec.

## 2024-06-28 NOTE — Progress Notes (Signed)
 Order placed for endocrinology referral.

## 2024-06-28 NOTE — Assessment & Plan Note (Signed)
 Planning f/u with ortho.

## 2024-06-28 NOTE — Assessment & Plan Note (Signed)
 Continue Crestor .  Low-cholesterol diet and exercise.  Follow lipid panel liver function test. No changes today.  Lab Results  Component Value Date   CHOL 149 06/23/2024   HDL 55.20 06/23/2024   LDLCALC 60 06/23/2024   LDLDIRECT 58.0 06/01/2021   TRIG 167.0 (H) 06/23/2024   CHOLHDL 3 06/23/2024

## 2024-06-29 NOTE — Telephone Encounter (Signed)
 Pt stated she is taking meds.tried calling atena call was disconnected

## 2024-07-01 ENCOUNTER — Encounter: Payer: Self-pay | Admitting: Student in an Organized Health Care Education/Training Program

## 2024-07-01 ENCOUNTER — Ambulatory Visit: Admitting: Student in an Organized Health Care Education/Training Program

## 2024-07-01 VITALS — BP 126/70 | HR 53 | Temp 98.1°F | Ht 63.0 in | Wt 283.2 lb

## 2024-07-01 DIAGNOSIS — R911 Solitary pulmonary nodule: Secondary | ICD-10-CM

## 2024-07-01 NOTE — Patient Instructions (Signed)
  VISIT SUMMARY: Today, you came in for a follow-up visit regarding the pulmonary nodules found on your coronary CT scan. We discussed your history of atrial fibrillation, mild asthma, and sleep apnea. You reported no significant respiratory symptoms and no history of smoking or exposure to harmful substances.  YOUR PLAN: -MULTIPLE TINY PULMONARY NODULES: Pulmonary nodules are small growths in the lungs. Your CT scan showed multiple tiny nodules that appear benign. Given your lack of symptoms and risk factors, we will monitor these nodules with a follow-up CT scan in March 2026. If they remain stable, we will do another scan in one year and may discontinue imaging if they stay stable or resolve.  -OBSTRUCTIVE SLEEP APNEA: Obstructive sleep apnea is a condition where your breathing stops and starts during sleep. You are managing this with CPAP therapy and have no issues with its use. However, you are overdue for a follow-up appointment and equipment check. Please schedule an appointment with sleep medicine for a CPAP evaluation and new supplies.  INSTRUCTIONS: Please schedule a follow-up CT scan for March 2026 to monitor your pulmonary nodules. Additionally, make an appointment with sleep medicine for a CPAP evaluation and new supplies.

## 2024-07-01 NOTE — Progress Notes (Signed)
 Assessment & Plan:   #Lung nodules  Multiple tiny scattered pulmonary nodules were identified on a coronary CT scan. They are very small and appear benign, with no significant risk factors for malignancy except age. She has no respiratory symptoms, smoking history, or occupational exposures. Will order a follow-up CT scan in 6 months (March 2026). If nodules are stable, will get another CT scan in one year to document stability. Discontinue imaging if nodules are stable after one year or if they resolve on follow-up.  - CT CHEST WO CONTRAST; Future  #Obstructive sleep apnea    Obstructive sleep apnea is managed with CPAP therapy. She reports no issues with CPAP usage but is overdue for a follow-up appointment and equipment check. Schedule an appointment with sleep medicine for CPAP evaluation and new supplies.    Return in about 5 months (around 11/29/2024).  Belva November, MD  Pulmonary Critical Care  I spent 45 minutes caring for this patient today, including preparing to see the patient, obtaining a medical history , reviewing a separately obtained history, performing a medically appropriate examination and/or evaluation, counseling and educating the patient/family/caregiver, ordering medications, tests, or procedures, documenting clinical information in the electronic health record, and independently interpreting results (not separately reported/billed) and communicating results to the patient/family/caregiver  End of visit medications:  No orders of the defined types were placed in this encounter.    Current Outpatient Medications:    apixaban (ELIQUIS) 5 MG TABS tablet, Take 5 mg by mouth 2 (two) times daily., Disp: , Rfl:    cetirizine (ZYRTEC) 10 MG tablet, Take 10 mg by mouth daily., Disp: , Rfl:    empagliflozin  (JARDIANCE ) 25 MG TABS tablet, Take 1 tablet (25 mg total) by mouth daily., Disp: 90 tablet, Rfl: 1   flecainide (TAMBOCOR) 100 MG tablet, Take 100 mg by  mouth 2 (two) times daily., Disp: , Rfl:    furosemide (LASIX) 20 MG tablet, Take 20 mg by mouth daily., Disp: , Rfl:    hydrochlorothiazide  (HYDRODIURIL ) 25 MG tablet, Take 1 tablet (25 mg total) by mouth daily., Disp: 90 tablet, Rfl: 1   Insulin Glargine  (BASAGLAR  KWIKPEN) 100 UNIT/ML, Inject 46 Units into the skin daily. Increase to MDD 50 units daily as instructed., Disp: 15 mL, Rfl: 3   Insulin Pen Needle (DROPLET PEN NEEDLES) 32G X 4 MM MISC, USE TO INJECT INSULIN DAILY, Disp: 100 each, Rfl: 3   lisinopril  (ZESTRIL ) 20 MG tablet, Take 1 tablet (20 mg total) by mouth daily., Disp: 90 tablet, Rfl: 1   metFORMIN  (GLUCOPHAGE -XR) 500 MG 24 hr tablet, Take 2 tablets (1,000 mg total) by mouth 2 (two) times daily., Disp: 360 tablet, Rfl: 1   metoprolol  tartrate (LOPRESSOR ) 25 MG tablet, Take 1 tablet (25 mg total) by mouth 2 (two) times daily., Disp: 180 tablet, Rfl: 1   metoprolol  tartrate (LOPRESSOR ) 25 MG tablet, TAKE 1 TABLET BY MOUTH 2 TIMES A DAY, Disp: 180 tablet, Rfl: 1   Multiple Vitamin (MULTIVITAMIN) tablet, Take 1 tablet by mouth daily., Disp: , Rfl:    omeprazole  (PRILOSEC) 20 MG capsule, Take 1 capsule (20 mg total) by mouth daily., Disp: 90 capsule, Rfl: 1   oxybutynin  (DITROPAN ) 5 MG tablet, Take 1 tablet (5 mg total) by mouth 2 (two) times daily., Disp: 180 tablet, Rfl: 1   rosuvastatin  (CRESTOR ) 10 MG tablet, Take 1 tablet (10 mg total) by mouth daily., Disp: 90 tablet, Rfl: 3   Subjective:   PATIENT ID: Christine  JAYSON Robertson GENDER: female DOB: 1956/03/21, MRN: [id number]  Chief Complaint  Patient presents with   Lung Mass    Nodule. No breathing problems.    HPI  Discussed the use of AI scribe software for clinical note transcription with the patient, who gave verbal consent to proceed.  Christine Robertson is a 68 year old female who presents for follow-up of pulmonary nodules found on a coronary CT scan.  In mid-July 2025, she underwent a coronary CT scan due to her history of  atrial fibrillation to rule out CAD. The scan revealed multiple tiny scattered pulmonary nodules, prompting this follow-up visit.  Her breathing is generally good, with a history of mild asthma that has improved over the last five to seven years, allowing her to discontinue inhaler use. No shortness of breath beyond her baseline, which occurs with extensive walking due to lack of exercise. No cough, chest pain, or chest tightness, although she occasionally feels tightness in her neck.  She has a history of sleep apnea and uses a CPAP machine nightly without issues.  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      Ancillary information including prior medications, full medical/surgical/family/social histories, and PFTs (when available) are listed below and have been reviewed.    Review of Systems  Constitutional:  Negative for chills, fever and weight loss.  Respiratory:  Negative for cough, hemoptysis, sputum production, shortness of breath and wheezing.   Cardiovascular:  Negative for chest pain.     Objective:   Vitals:   07/01/24 1436  BP: 126/70  Pulse: (!) 53  Temp: 98.1 F (36.7 C)  SpO2: 95%  Weight: 283 lb 3.2 oz (128.5 kg)  Height: 5' 3 (1.6 m)   95% on RA  BMI Readings from Last 3 Encounters:  07/01/24 50.17 kg/m  06/23/24 49.56 kg/m  03/24/24 49.95 kg/m   Wt Readings from Last 3 Encounters:  07/01/24 283 lb 3.2 oz (128.5 kg)  06/23/24 279 lb 12.8 oz (126.9 kg)  03/24/24 282 lb (127.9 kg)    Physical Exam Constitutional:      Appearance: Normal appearance. She is obese.  Cardiovascular:     Rate and Rhythm: Normal rate and regular rhythm.     Pulses: Normal pulses.     Heart sounds: Normal heart sounds.  Pulmonary:     Effort: Pulmonary effort is normal.     Breath sounds: Normal breath sounds.  Neurological:     General: No focal deficit present.      Mental Status: She is alert and oriented to person, place, and time. Mental status is at baseline.       Ancillary Information    Past Medical History:  Diagnosis Date   Abnormal results of thyroid  function studies    Allergy    hay fever   Angina pectoris, unspecified    Arthritis    Atrial fibrillation, unspecified    Chicken pox    Cold hands    Congenital anomaly of adrenal gland    Diabetes mellitus    type 2, uncomplicated   Essential hypertension, benign    Family history of adverse reaction to anesthesia    Mother - PONV   GERD (gastroesophageal reflux disease)    IBS (irritable bowel syndrome)    Malaise    Morbid obesity (HCC)    Murmur    Pain in soft tissues  of limb    Proteinuria    Pure hypercholesterolemia    Rash and other nonspecific skin eruption    Sleep apnea    Obstructive; Uses C-Pap machine   Urinary incontinence    Vertigo    1 episode, approx 2013   Wears contact lenses    left eye     Family History  Problem Relation Age of Onset   Diabetes Mother    Cancer Father        lung   Cerebral aneurysm Brother 84       s/p rupture   Breast cancer Maternal Grandmother 60   Breast cancer Maternal Aunt 56     Past Surgical History:  Procedure Laterality Date   ABDOMINAL HYSTERECTOMY  2010   BLADDER SURGERY  1967   stretching of bladder   BREAST BIOPSY Left 03/28/2015   done at Dr. Fredirick office. fibrocystic changes   CARDIOVERSION N/A 03/24/2024   Procedure: CARDIOVERSION;  Surgeon: Wilburn Keller BROCKS, MD;  Location: [facility];  Service: Cardiovascular;  Laterality: N/A;   CATARACT EXTRACTION W/PHACO Right 04/10/2022   Procedure: CATARACT EXTRACTION PHACO AND INTRAOCULAR LENS PLACEMENT (IOC) RIGHT DIABETIC VIVITY LENS;  Surgeon: Jaye Fallow, MD;  Location: [facility];  Service: Ophthalmology;  Laterality: Right;  6.89 0:40.8   CATARACT EXTRACTION W/PHACO Left 04/24/2022   Procedure: CATARACT EXTRACTION PHACO AND  INTRAOCULAR LENS PLACEMENT (IOC) LEFT DIABETIC VIVITY  toric LENS 5.29 00:41.8;  Surgeon: Jaye Fallow, MD;  Location: [facility];  Service: Ophthalmology;  Laterality: Left;  Diabetic   COLONOSCOPY     COLONOSCOPY WITH PROPOFOL  N/A 09/09/2015   Procedure: COLONOSCOPY WITH PROPOFOL ;  Surgeon: Lamar ONEIDA Holmes, MD;  Location: [facility];  Service: Endoscopy;  Laterality: N/A;   COLONOSCOPY WITH PROPOFOL  N/A 09/02/2023   Procedure: COLONOSCOPY WITH PROPOFOL ;  Surgeon: Maryruth Ole ONEIDA, MD;  Location: [facility];  Service: Endoscopy;  Laterality: N/A;   ESOPHAGOGASTRODUODENOSCOPY     EYE SURGERY     TEE WITHOUT CARDIOVERSION N/A 03/24/2024   Procedure: ECHOCARDIOGRAM, TRANSESOPHAGEAL;  Surgeon: Alluri, Keller BROCKS, MD;  Location: [facility];  Service: Cardiovascular;  Laterality: N/A;   TONSILLECTOMY AND ADENOIDECTOMY  1959   URETHRAL DILATION      Social History   Socioeconomic History   Marital status: Married    Spouse name: Not on file   Number of children: Not on file   Years of education: Not on file   Highest education level: 12th grade  Occupational History   Not on file  Tobacco Use   Smoking status: Never   Smokeless tobacco: Never  Vaping Use   Vaping status: Never Used  Substance and Sexual Activity   Alcohol use: No    Alcohol/week: 0.0 standard drinks of alcohol   Drug use: No   Sexual activity: Not on file  Other Topics Concern   Not on file  Social History Narrative   Not on file   Social Drivers of Health   Financial Resource Strain: Low Risk  (06/22/2024)   Overall Financial Resource Strain (CARDIA)    Difficulty of Paying Living Expenses: Not very hard  Food Insecurity: No Food Insecurity (06/22/2024)   Hunger Vital Sign    Worried About Running Out of Food in the Last Year: Never true    Ran Out of Food in the Last Year: Never true  Transportation Needs: No Transportation Needs (06/22/2024)   PRAPARE - Scientist, Research (physical Sciences) (Medical):  No    Lack of Transportation (Non-Medical): No  Physical Activity: Inactive (06/22/2024)   Exercise Vital Sign    Days of Exercise per Week: 0 days    Minutes of Exercise per Session: Not on file  Stress: No Stress Concern Present (06/22/2024)   [employer] of Occupational Health - Occupational Stress Questionnaire    Feeling of Stress: Not at all  Social Connections: Socially Integrated (06/22/2024)   Social Connection and Isolation Panel    Frequency of Communication with Friends and Family: More than three times a week    Frequency of Social Gatherings with Friends and Family: Three times a week    Attends Religious Services: More than 4 times per year    Active Member of Clubs or Organizations: Yes    Attends Banker Meetings: More than 4 times per year    Marital Status: Married  Catering Manager Violence: Not At Risk (11/26/2023)   Humiliation, Afraid, Rape, and Kick questionnaire    Fear of Current or Ex-Partner: No    Emotionally Abused: No    Physically Abused: No    Sexually Abused: No     Allergies  Allergen Reactions   Erythromycin Hives   Augmentin [Amoxicillin-Pot Clavulanate] Hives   Demerol [Meperidine] Nausea Only     CBC    Component Value Date/Time   WBC 10.7 (H) 06/23/2024 1425   RBC 4.85 06/23/2024 1425   HGB 14.0 06/23/2024 1425   HCT 42.7 06/23/2024 1425   PLT 220.0 06/23/2024 1425   MCV 88.1 06/23/2024 1425   MCH 29.2 11/15/2012 1813   MCHC 32.8 06/23/2024 1425   RDW 14.4 06/23/2024 1425   LYMPHSABS 1.6 06/23/2024 1425   MONOABS 0.6 06/23/2024 1425   EOSABS 0.2 06/23/2024 1425   BASOSABS 0.1 06/23/2024 1425    Pulmonary Functions Testing Results:     No data to display          Outpatient Medications Prior to Visit  Medication Sig Dispense Refill   apixaban (ELIQUIS) 5 MG TABS tablet Take 5 mg by mouth 2 (two) times daily.     cetirizine (ZYRTEC) 10 MG tablet Take 10 mg by mouth daily.      empagliflozin  (JARDIANCE ) 25 MG TABS tablet Take 1 tablet (25 mg total) by mouth daily. 90 tablet 1   flecainide (TAMBOCOR) 100 MG tablet Take 100 mg by mouth 2 (two) times daily.     furosemide (LASIX) 20 MG tablet Take 20 mg by mouth daily.     hydrochlorothiazide  (HYDRODIURIL ) 25 MG tablet Take 1 tablet (25 mg total) by mouth daily. 90 tablet 1   Insulin Glargine  (BASAGLAR  KWIKPEN) 100 UNIT/ML Inject 46 Units into the skin daily. Increase to MDD 50 units daily as instructed. 15 mL 3   Insulin Pen Needle (DROPLET PEN NEEDLES) 32G X 4 MM MISC USE TO INJECT INSULIN DAILY 100 each 3   lisinopril  (ZESTRIL ) 20 MG tablet Take 1 tablet (20 mg total) by mouth daily. 90 tablet 1   metFORMIN  (GLUCOPHAGE -XR) 500 MG 24 hr tablet Take 2 tablets (1,000 mg total) by mouth 2 (two) times daily. 360 tablet 1   metoprolol  tartrate (LOPRESSOR ) 25 MG tablet Take 1 tablet (25 mg total) by mouth 2 (two) times daily. 180 tablet 1   metoprolol  tartrate (LOPRESSOR ) 25 MG tablet TAKE 1 TABLET BY MOUTH 2 TIMES A DAY 180 tablet 1   Multiple Vitamin (MULTIVITAMIN) tablet Take 1 tablet by mouth daily.  omeprazole  (PRILOSEC) 20 MG capsule Take 1 capsule (20 mg total) by mouth daily. 90 capsule 1   oxybutynin  (DITROPAN ) 5 MG tablet Take 1 tablet (5 mg total) by mouth 2 (two) times daily. 180 tablet 1   rosuvastatin  (CRESTOR ) 10 MG tablet Take 1 tablet (10 mg total) by mouth daily. 90 tablet 3   No facility-administered medications prior to visit.

## 2024-07-06 ENCOUNTER — Other Ambulatory Visit (INDEPENDENT_AMBULATORY_CARE_PROVIDER_SITE_OTHER)

## 2024-07-06 ENCOUNTER — Other Ambulatory Visit

## 2024-07-06 DIAGNOSIS — E7800 Pure hypercholesterolemia, unspecified: Secondary | ICD-10-CM

## 2024-07-06 DIAGNOSIS — D649 Anemia, unspecified: Secondary | ICD-10-CM | POA: Diagnosis not present

## 2024-07-06 LAB — CBC WITH DIFFERENTIAL/PLATELET
Basophils Absolute: 0 K/uL (ref 0.0–0.1)
Basophils Relative: 0.3 % (ref 0.0–3.0)
Eosinophils Absolute: 0.1 K/uL (ref 0.0–0.7)
Eosinophils Relative: 1.2 % (ref 0.0–5.0)
HCT: 40.9 % (ref 36.0–46.0)
Hemoglobin: 13.4 g/dL (ref 12.0–15.0)
Lymphocytes Relative: 16.5 % (ref 12.0–46.0)
Lymphs Abs: 1.5 K/uL (ref 0.7–4.0)
MCHC: 32.9 g/dL (ref 30.0–36.0)
MCV: 87.9 fl (ref 78.0–100.0)
Monocytes Absolute: 0.6 K/uL (ref 0.1–1.0)
Monocytes Relative: 6.2 % (ref 3.0–12.0)
Neutro Abs: 7 K/uL (ref 1.4–7.7)
Neutrophils Relative %: 75.8 % (ref 43.0–77.0)
Platelets: 221 K/uL (ref 150.0–400.0)
RBC: 4.65 Mil/uL (ref 3.87–5.11)
RDW: 14.7 % (ref 11.5–15.5)
WBC: 9.2 K/uL (ref 4.0–10.5)

## 2024-07-06 LAB — BASIC METABOLIC PANEL WITH GFR
BUN: 34 mg/dL — ABNORMAL HIGH (ref 6–23)
CO2: 27 meq/L (ref 19–32)
Calcium: 9.6 mg/dL (ref 8.4–10.5)
Chloride: 105 meq/L (ref 96–112)
Creatinine, Ser: 1.12 mg/dL (ref 0.40–1.20)
GFR: 49.91 mL/min — ABNORMAL LOW (ref >60.00)
Glucose, Bld: 113 mg/dL — ABNORMAL HIGH (ref 70–99)
Potassium: 4.7 meq/L (ref 3.5–5.1)
Sodium: 140 meq/L (ref 135–145)

## 2024-07-07 ENCOUNTER — Ambulatory Visit: Payer: Self-pay | Admitting: Internal Medicine

## 2024-07-07 ENCOUNTER — Encounter: Payer: Self-pay | Admitting: Sleep Medicine

## 2024-07-07 ENCOUNTER — Ambulatory Visit (INDEPENDENT_AMBULATORY_CARE_PROVIDER_SITE_OTHER): Admitting: Sleep Medicine

## 2024-07-07 VITALS — BP 128/80 | HR 52 | Temp 97.6°F | Ht 63.0 in | Wt 287.6 lb

## 2024-07-07 DIAGNOSIS — G4733 Obstructive sleep apnea (adult) (pediatric): Secondary | ICD-10-CM

## 2024-07-07 DIAGNOSIS — R899 Unspecified abnormal finding in specimens from other organs, systems and tissues: Secondary | ICD-10-CM

## 2024-07-07 DIAGNOSIS — I10 Essential (primary) hypertension: Secondary | ICD-10-CM | POA: Diagnosis not present

## 2024-07-07 NOTE — Progress Notes (Signed)
 Name:Christine Robertson MRN: [id number] DOB: 01/21/1956   CHIEF COMPLAINT:  CPAP F/U   HISTORY OF PRESENT ILLNESS: Christine Robertson is a 68 y.o. w/ a h/o OSA, atrial fibrillation, DMII, HTN and morbid obesity who presents to establish care for OSA. Reports using CPAP therapy every night, which is confirmed by compliance data. She is currently using the Airfit F30 FFM, which causes significant air leaks. Reports feeling significantly more refreshed upon awakening with CPAP therapy.     EPWORTH SLEEP SCORE 6    07/07/2024    1:00 PM 02/08/2020    2:00 PM  Results of the Epworth flowsheet  Sitting and reading 0 2  Watching TV 2 2  Sitting, inactive in a public place (e.g. a theatre or a meeting) 0 1  As a passenger in a car for an hour without a break 1 2  Lying down to rest in the afternoon when circumstances permit 1 2  Sitting and talking to someone 0 1  Sitting quietly after a lunch without alcohol 2 2  In a car, while stopped for a few minutes in traffic 0 0  Total score 6 12    PAST MEDICAL HISTORY :   has a past medical history of Abnormal results of thyroid  function studies, Allergy, Angina pectoris, unspecified, Arthritis, Asthma, Atrial fibrillation, unspecified, Chicken pox, Cold hands, Congenital anomaly of adrenal gland, Diabetes mellitus, Essential hypertension, benign, Family history of adverse reaction to anesthesia, GERD (gastroesophageal reflux disease), IBS (irritable bowel syndrome), Malaise, Morbid obesity (HCC), Murmur, Pain in soft tissues of limb, Proteinuria, Pure hypercholesterolemia, Rash and other nonspecific skin eruption, Sleep apnea, Urinary incontinence, Vertigo, and Wears contact lenses.  has a past surgical history that includes Abdominal hysterectomy (2010); Tonsillectomy and adenoidectomy (1959); Bladder surgery (8032); Colonoscopy; Esophagogastroduodenoscopy; Urethral dilation; Colonoscopy with propofol  (N/A, 09/09/2015); Breast biopsy (Left,  03/28/2015); Cataract extraction w/PHACO (Right, 04/10/2022); Cataract extraction w/PHACO (Left, 04/24/2022); Eye surgery; Colonoscopy with propofol  (N/A, 09/02/2023); TEE without cardioversion (N/A, 03/24/2024); and Cardioversion (N/A, 03/24/2024). Prior to Admission medications   Medication Sig Start Date End Date Taking? Authorizing Provider  apixaban (ELIQUIS) 5 MG TABS tablet Take 5 mg by mouth 2 (two) times daily.   Yes [provider]  cetirizine (ZYRTEC) 10 MG tablet Take 10 mg by mouth daily.   Yes [provider]  empagliflozin  (JARDIANCE ) 25 MG TABS tablet Take 1 tablet (25 mg total) by mouth daily. 07/11/23  Yes Glendia Shad, MD  flecainide (TAMBOCOR) 100 MG tablet Take 100 mg by mouth 2 (two) times daily.   Yes [provider]  furosemide (LASIX) 20 MG tablet Take 20 mg by mouth daily. 03/04/24 03/04/25 Yes [provider]  hydrochlorothiazide  (HYDRODIURIL ) 25 MG tablet Take 1 tablet (25 mg total) by mouth daily. 06/23/24  Yes Glendia Shad, MD  Insulin Glargine  (BASAGLAR  KWIKPEN) 100 UNIT/ML Inject 46 Units into the skin daily. Increase to MDD 50 units daily as instructed. 12/03/23  Yes Glendia Shad, MD  Insulin Pen Needle (DROPLET PEN NEEDLES) 32G X 4 MM MISC USE TO INJECT INSULIN DAILY 06/23/24  Yes Glendia Shad, MD  lisinopril  (ZESTRIL ) 20 MG tablet Take 1 tablet (20 mg total) by mouth daily. 06/23/24  Yes Glendia Shad, MD  metFORMIN  (GLUCOPHAGE -XR) 500 MG 24 hr tablet Take 2 tablets (1,000 mg total) by mouth 2 (two) times daily. 06/23/24  Yes Glendia Shad, MD  metoprolol  tartrate (LOPRESSOR ) 25 MG tablet Take 1 tablet (25 mg total) by mouth  2 (two) times daily. 06/23/24  Yes Glendia Shad, MD  metoprolol  tartrate (LOPRESSOR ) 25 MG tablet TAKE 1 TABLET BY MOUTH 2 TIMES A DAY 06/22/24  Yes Glendia Shad, MD  Multiple Vitamin (MULTIVITAMIN) tablet Take 1 tablet by mouth daily.   Yes [provider]  omeprazole  (PRILOSEC) 20 MG  capsule Take 1 capsule (20 mg total) by mouth daily. 06/23/24  Yes Glendia Shad, MD  oxybutynin  (DITROPAN ) 5 MG tablet Take 1 tablet (5 mg total) by mouth 2 (two) times daily. 06/23/24  Yes Glendia Shad, MD  rosuvastatin  (CRESTOR ) 10 MG tablet Take 1 tablet (10 mg total) by mouth daily. 06/23/24  Yes Glendia Shad, MD   Allergies  Allergen Reactions   Erythromycin Hives   Augmentin [Amoxicillin-Pot Clavulanate] Hives   Demerol [Meperidine] Nausea Only    FAMILY HISTORY:  family history includes Breast cancer (age of onset: 46) in her maternal grandmother; Breast cancer (age of onset: 39) in her maternal aunt; Cancer in her father; Cerebral aneurysm (age of onset: 77) in her brother; Diabetes in her mother; Heart disease in her father. SOCIAL HISTORY:  reports that she has never smoked. She has never used smokeless tobacco. She reports that she does not drink alcohol and does not use drugs.   Review of Systems:  Gen:  Denies  fever, sweats, chills weight loss  HEENT: Denies blurred vision, double vision, ear pain, eye pain, hearing loss, nose bleeds, sore throat Cardiac:  No dizziness, chest pain or heaviness, chest tightness,edema, No JVD Resp:   No cough, -sputum production, -shortness of breath,-wheezing, -hemoptysis,  Gi: Denies swallowing difficulty, stomach pain, nausea or vomiting, diarrhea, constipation, bowel incontinence Gu:  Denies bladder incontinence, burning urine Ext:   Denies Joint pain, stiffness or swelling Skin: Denies  skin rash, easy bruising or bleeding or hives Endoc:  Denies polyuria, polydipsia , polyphagia or weight change Psych:   Denies depression, insomnia or hallucinations  Other:  All other systems negative  VITAL SIGNS: BP 128/80   Pulse (!) 52   Temp 97.6 F (36.4 C)   Ht 5' 3 (1.6 m)   Wt 287 lb 9.6 oz (130.5 kg)   SpO2 95%   BMI 50.95 kg/m    Physical Examination:   General Appearance: No distress  EYES PERRLA, EOM intact.    NECK Supple, No JVD Pulmonary: normal breath sounds, No wheezing.  CardiovascularNormal S1,S2.  No m/r/g.   Abdomen: Benign, Soft, non-tender. Skin:   warm, no rashes, no ecchymosis  Extremities: normal, no cyanosis, clubbing. Neuro:without focal findings,  speech normal  PSYCHIATRIC: Mood, affect within normal limits.   ASSESSMENT AND PLAN  OSA Patient is using and benefiting from CPAP therapy. AHI is elevated which I suspect is secondary to mask leaks, will provide patient with a new CPAP mask today and new supply order. Discussed the consequences of untreated sleep apnea. Advised not to drive drowsy for safety of patient and others. Will follow up in 3 months.    HTN Stable, on current management. Following with PCP.    Patient  satisfied with Plan of action and management. All questions answered  I spent a total of 42 minutes reviewing chart data, face-to-face evaluation with the patient, counseling and coordination of care as detailed above.    Audon Heymann, M.D.  Sleep Medicine [facility]

## 2024-07-07 NOTE — Patient Instructions (Signed)

## 2024-08-03 ENCOUNTER — Other Ambulatory Visit (INDEPENDENT_AMBULATORY_CARE_PROVIDER_SITE_OTHER)

## 2024-08-03 DIAGNOSIS — R899 Unspecified abnormal finding in specimens from other organs, systems and tissues: Secondary | ICD-10-CM | POA: Diagnosis not present

## 2024-08-04 ENCOUNTER — Ambulatory Visit: Payer: Self-pay | Admitting: Internal Medicine

## 2024-08-04 LAB — BASIC METABOLIC PANEL WITH GFR
BUN: 21 mg/dL (ref 6–23)
CO2: 29 meq/L (ref 19–32)
Calcium: 9.8 mg/dL (ref 8.4–10.5)
Chloride: 103 meq/L (ref 96–112)
Creatinine, Ser: 0.93 mg/dL (ref 0.40–1.20)
GFR: 62.35 mL/min (ref >60.00)
Glucose, Bld: 97 mg/dL (ref 70–99)
Potassium: 4.3 meq/L (ref 3.5–5.1)
Sodium: 143 meq/L (ref 135–145)

## 2024-09-08 ENCOUNTER — Encounter: Payer: Self-pay | Admitting: Internal Medicine

## 2024-09-09 NOTE — Telephone Encounter (Signed)
 Form completed. Will need to send last office note. Thanks.

## 2024-09-09 NOTE — Telephone Encounter (Signed)
 Paper work placed in folder for review

## 2024-09-24 ENCOUNTER — Telehealth: Payer: Self-pay

## 2024-09-24 NOTE — Telephone Encounter (Signed)
 Received paperwork from [employer] [facility] for libre . Need to know if pt requested this or not. Okay to relay when pt returns call

## 2024-09-24 NOTE — Telephone Encounter (Unsigned)
 Copied from CRM #[id number]. Topic: Clinical - Order For Equipment >> Sep 24, 2024  2:42 PM Aisha D wrote: Reason for CRM: Pt is returning a missed call from Daijah in regards to the paperwork from [employer] [facility] for the [place]. Pt stated that she did request this and its ok to proceed.

## 2024-09-25 NOTE — Telephone Encounter (Signed)
 Forms faxed

## 2024-09-25 NOTE — Telephone Encounter (Signed)
 Signed and placed in box.

## 2024-10-05 ENCOUNTER — Ambulatory Visit: Admitting: Internal Medicine

## 2024-10-07 ENCOUNTER — Encounter: Payer: Self-pay | Admitting: Sleep Medicine

## 2024-10-07 ENCOUNTER — Ambulatory Visit: Admitting: Sleep Medicine

## 2024-10-07 VITALS — BP 134/84 | HR 68 | Temp 98.5°F | Ht 63.0 in | Wt 294.2 lb

## 2024-10-07 DIAGNOSIS — G4733 Obstructive sleep apnea (adult) (pediatric): Secondary | ICD-10-CM | POA: Diagnosis not present

## 2024-10-07 DIAGNOSIS — I10 Essential (primary) hypertension: Secondary | ICD-10-CM | POA: Diagnosis not present

## 2024-10-07 NOTE — Patient Instructions (Addendum)
" °  Patient Instructions Need to be using your CPAP every night, minimum of 7-8 hours a night.  Change equipment as directed. Wash your tubing with warm soap and water  weekly, hang to dry. Wash humidifier portion weekly. Use bottled, distilled water  and change daily Be aware of reduced alertness and do not drive or operate heavy machinery if experiencing this or drowsiness.  Exercise encouraged, as tolerated. Healthy weight management discussed.  Avoid or decrease alcohol consumption and medications that make you more sleepy, if possible. Notify if persistent daytime sleepiness occurs even with consistent use of PAP therapy.   Change CPAP supplies... Every month Mask cushions and/or nasal pillows CPAP machine filters Every 3 months Mask frame (not including the headgear) CPAP tubing Every 6 months Mask headgear Chin strap (if applicable) Humidifier water  tub   Be aware of reduced alertness and do not drive or operate heavy machinery if experiencing this or drowsiness.  Exercise encouraged, as tolerated. Encouraged proper weight management.  Important to get eight or more hours of sleep  Limiting the use of the computer and television before bedtime.  Decrease naps during the day, so night time sleep will become enhanced.  Limit caffeine, and sleep deprivation.  HTN, stroke, uncontrolled diabetes and heart failure are potential risk factors.  Risk of untreated sleep apnea including cardiac arrhthymias, stroke, DM, pulm HTN.           "

## 2024-10-07 NOTE — Progress Notes (Signed)
 "      Name:Christine Robertson MRN: [id number] DOB: July 31, 1956   CHIEF COMPLAINT:  CPAP F/U   HISTORY OF PRESENT ILLNESS: Ms. Palecek is a 69 y.o. w/ a h/o OSA, atrial fibrillation, DMII, HTN and morbid obesity who presents for CPAP follow up visit. Reports using CPAP therapy every night, which is confirmed by compliance data. She is currently using the Airfit F40 FFM, which causes significant air leaks. Reports feeling significantly more refreshed upon awakening with CPAP therapy.     EPWORTH SLEEP SCORE 6    07/07/2024    1:00 PM 02/08/2020    2:00 PM  Results of the Epworth flowsheet  Sitting and reading 0 2  Watching TV 2 2  Sitting, inactive in a public place (e.g. a theatre or a meeting) 0 1  As a passenger in a car for an hour without a break 1 2  Lying down to rest in the afternoon when circumstances permit 1 2  Sitting and talking to someone 0 1  Sitting quietly after a lunch without alcohol 2 2  In a car, while stopped for a few minutes in traffic 0 0  Total score 6 12    PAST MEDICAL HISTORY :   has a past medical history of Abnormal results of thyroid  function studies, Allergy, Angina pectoris, unspecified, Arthritis, Asthma, Atrial fibrillation, unspecified, Chicken pox, Cold hands, Congenital anomaly of adrenal gland, Diabetes mellitus, Essential hypertension, benign, Family history of adverse reaction to anesthesia, GERD (gastroesophageal reflux disease), IBS (irritable bowel syndrome), Malaise, Morbid obesity (HCC), Murmur, Pain in soft tissues of limb, Proteinuria, Pure hypercholesterolemia, Rash and other nonspecific skin eruption, Sleep apnea, Urinary incontinence, Vertigo, and Wears contact lenses.  has a past surgical history that includes Abdominal hysterectomy (2010); Tonsillectomy and adenoidectomy (1959); Bladder surgery (8032); Colonoscopy; Esophagogastroduodenoscopy; Urethral dilation; Colonoscopy with propofol  (N/A, 09/09/2015); Breast biopsy (Left, 03/28/2015);  Cataract extraction w/PHACO (Right, 04/10/2022); Cataract extraction w/PHACO (Left, 04/24/2022); Eye surgery; Colonoscopy with propofol  (N/A, 09/02/2023); TEE without cardioversion (N/A, 03/24/2024); and Cardioversion (N/A, 03/24/2024). Prior to Admission medications   Medication Sig Start Date End Date Taking? Authorizing Provider  apixaban (ELIQUIS) 5 MG TABS tablet Take 5 mg by mouth 2 (two) times daily.   Yes [provider]  cetirizine (ZYRTEC) 10 MG tablet Take 10 mg by mouth daily.   Yes [provider]  empagliflozin  (JARDIANCE ) 25 MG TABS tablet Take 1 tablet (25 mg total) by mouth daily. 07/11/23  Yes Glendia Shad, MD  flecainide (TAMBOCOR) 100 MG tablet Take 100 mg by mouth 2 (two) times daily.   Yes [provider]  furosemide (LASIX) 20 MG tablet Take 20 mg by mouth daily. 03/04/24 03/04/25 Yes [provider]  hydrochlorothiazide  (HYDRODIURIL ) 25 MG tablet Take 1 tablet (25 mg total) by mouth daily. 06/23/24  Yes Glendia Shad, MD  Insulin Glargine  (BASAGLAR  [facility]) 100 UNIT/ML Inject 46 Units into the skin daily. Increase to MDD 50 units daily as instructed. 12/03/23  Yes Glendia Shad, MD  Insulin Pen Needle (DROPLET PEN NEEDLES) 32G X 4 MM MISC USE TO INJECT INSULIN DAILY 06/23/24  Yes Glendia Shad, MD  lisinopril  (ZESTRIL ) 20 MG tablet Take 1 tablet (20 mg total) by mouth daily. 06/23/24  Yes Glendia Shad, MD  metFORMIN  (GLUCOPHAGE -XR) 500 MG 24 hr tablet Take 2 tablets (1,000 mg total) by mouth 2 (two) times daily. 06/23/24  Yes Glendia Shad, MD  metoprolol  tartrate (LOPRESSOR ) 25 MG tablet Take 1 tablet (25 mg total) by  mouth 2 (two) times daily. 06/23/24  Yes Glendia Shad, MD  metoprolol  tartrate (LOPRESSOR ) 25 MG tablet TAKE 1 TABLET BY MOUTH 2 TIMES A DAY 06/22/24  Yes Glendia Shad, MD  Multiple Vitamin (MULTIVITAMIN) tablet Take 1 tablet by mouth daily.   Yes [provider]  omeprazole  (PRILOSEC) 20 MG capsule Take 1  capsule (20 mg total) by mouth daily. 06/23/24  Yes Glendia Shad, MD  oxybutynin  (DITROPAN ) 5 MG tablet Take 1 tablet (5 mg total) by mouth 2 (two) times daily. 06/23/24  Yes Glendia Shad, MD  rosuvastatin  (CRESTOR ) 10 MG tablet Take 1 tablet (10 mg total) by mouth daily. 06/23/24  Yes Glendia Shad, MD   Allergies  Allergen Reactions   Erythromycin Hives   Augmentin [Amoxicillin-Pot Clavulanate] Hives   Demerol [Meperidine] Nausea Only    FAMILY HISTORY:  family history includes Breast cancer (age of onset: 46) in her maternal grandmother; Breast cancer (age of onset: 74) in her maternal aunt; Cancer in her father; Cerebral aneurysm (age of onset: 110) in her brother; Diabetes in her mother; Heart disease in her father. SOCIAL HISTORY:  reports that she has never smoked. She has never used smokeless tobacco. She reports that she does not drink alcohol and does not use drugs.   Review of Systems:  Gen:  Denies  fever, sweats, chills weight loss  HEENT: Denies blurred vision, double vision, ear pain, eye pain, hearing loss, nose bleeds, sore throat Cardiac:  No dizziness, chest pain or heaviness, chest tightness,edema, No JVD Resp:   No cough, -sputum production, -shortness of breath,-wheezing, -hemoptysis,  Gi: Denies swallowing difficulty, stomach pain, nausea or vomiting, diarrhea, constipation, bowel incontinence Gu:  Denies bladder incontinence, burning urine Ext:   Denies Joint pain, stiffness or swelling Skin: Denies  skin rash, easy bruising or bleeding or hives Endoc:  Denies polyuria, polydipsia , polyphagia or weight change Psych:   Denies depression, insomnia or hallucinations  Other:  All other systems negative  VITAL SIGNS: BP 134/84   Pulse 68   Temp 98.5 F (36.9 C)   Ht 5' 3 (1.6 m)   Wt 294 lb 3.2 oz (133.4 kg)   SpO2 96%   BMI 52.12 kg/m     Physical Examination:   General Appearance: No distress  EYES PERRLA, EOM intact.   NECK Supple, No  JVD Pulmonary: normal breath sounds, No wheezing.  CardiovascularNormal S1,S2.  No m/r/g.   Abdomen: Benign, Soft, non-tender. Skin:   warm, no rashes, no ecchymosis  Extremities: normal, no cyanosis, clubbing. Neuro:without focal findings,  speech normal  PSYCHIATRIC: Mood, affect within normal limits.   ASSESSMENT AND PLAN  OSA Patient is using and benefiting from CPAP therapy. AHI is elevated which I suspect is secondary to mask leaks, will provide patient with the A[employer] F30i FFM to try. Discussed the consequences of untreated sleep apnea. Advised not to drive drowsy for safety of patient and others. Will follow up in 3 months.    HTN Stable, on current management. Following with PCP.    Patient  satisfied with Plan of action and management. All questions answered  I spent a total of 23 minutes reviewing chart data, face-to-face evaluation with the patient, counseling and coordination of care as detailed above.    Orlondo Holycross, M.D.  Sleep Medicine [facility]        "

## 2024-11-16 ENCOUNTER — Ambulatory Visit

## 2024-11-25 ENCOUNTER — Encounter

## 2024-12-07 ENCOUNTER — Ambulatory Visit: Admitting: Student in an Organized Health Care Education/Training Program

## 2024-12-08 ENCOUNTER — Ambulatory Visit: Admitting: Internal Medicine

## 2025-01-04 ENCOUNTER — Ambulatory Visit: Admitting: Sleep Medicine
